# Patient Record
Sex: Female | Born: 1940 | Race: White | Hispanic: No | Smoking: Former smoker
Health system: Southern US, Community
[De-identification: ages and names within clinical notes are randomized; demographics above are authoritative.]

## PROBLEM LIST (undated history)

## (undated) ENCOUNTER — Emergency Department (HOSPITAL_COMMUNITY): Admission: EM | Payer: Medicare Other | Source: Home / Self Care

## (undated) DIAGNOSIS — F419 Anxiety disorder, unspecified: Secondary | ICD-10-CM

## (undated) DIAGNOSIS — J4489 Other specified chronic obstructive pulmonary disease: Secondary | ICD-10-CM

## (undated) DIAGNOSIS — E785 Hyperlipidemia, unspecified: Secondary | ICD-10-CM

## (undated) DIAGNOSIS — N390 Urinary tract infection, site not specified: Secondary | ICD-10-CM

## (undated) DIAGNOSIS — I00 Rheumatic fever without heart involvement: Secondary | ICD-10-CM

## (undated) DIAGNOSIS — I350 Nonrheumatic aortic (valve) stenosis: Secondary | ICD-10-CM

## (undated) DIAGNOSIS — F0390 Unspecified dementia without behavioral disturbance: Secondary | ICD-10-CM

## (undated) DIAGNOSIS — Z8639 Personal history of other endocrine, nutritional and metabolic disease: Secondary | ICD-10-CM

## (undated) DIAGNOSIS — A419 Sepsis, unspecified organism: Secondary | ICD-10-CM

## (undated) DIAGNOSIS — I10 Essential (primary) hypertension: Secondary | ICD-10-CM

## (undated) DIAGNOSIS — R569 Unspecified convulsions: Secondary | ICD-10-CM

## (undated) DIAGNOSIS — F32A Depression, unspecified: Secondary | ICD-10-CM

## (undated) DIAGNOSIS — L723 Sebaceous cyst: Secondary | ICD-10-CM

## (undated) DIAGNOSIS — F329 Major depressive disorder, single episode, unspecified: Secondary | ICD-10-CM

## (undated) DIAGNOSIS — J449 Chronic obstructive pulmonary disease, unspecified: Secondary | ICD-10-CM

## (undated) DIAGNOSIS — N289 Disorder of kidney and ureter, unspecified: Secondary | ICD-10-CM

## (undated) DIAGNOSIS — M1990 Unspecified osteoarthritis, unspecified site: Secondary | ICD-10-CM

## (undated) HISTORY — PX: CARDIAC VALVE REPLACEMENT: SHX585

## (undated) HISTORY — PX: BREAST LUMPECTOMY: SHX2

## (undated) HISTORY — PX: AORTIC VALVE REPLACEMENT: SHX41

## (undated) HISTORY — DX: Urinary tract infection, site not specified: N39.0

## (undated) HISTORY — DX: Chronic obstructive pulmonary disease, unspecified: J44.9

## (undated) HISTORY — PX: TONSILLECTOMY: SUR1361

## (undated) HISTORY — PX: APPENDECTOMY: SHX54

## (undated) HISTORY — DX: Sebaceous cyst: L72.3

## (undated) HISTORY — PX: CHOLECYSTECTOMY: SHX55

## (undated) HISTORY — DX: Anxiety disorder, unspecified: F41.9

## (undated) HISTORY — DX: Rheumatic fever without heart involvement: I00

## (undated) HISTORY — DX: Nonrheumatic aortic (valve) stenosis: I35.0

## (undated) HISTORY — DX: Disorder of kidney and ureter, unspecified: N28.9

## (undated) HISTORY — DX: Essential (primary) hypertension: I10

## (undated) HISTORY — DX: Hyperlipidemia, unspecified: E78.5

## (undated) HISTORY — DX: Sepsis, unspecified organism: A41.9

## (undated) HISTORY — DX: Other specified chronic obstructive pulmonary disease: J44.89

## (undated) HISTORY — DX: Personal history of other endocrine, nutritional and metabolic disease: Z86.39

## (undated) HISTORY — PX: LUMBAR LAMINECTOMY: SHX95

---

## 2002-04-29 ENCOUNTER — Encounter: Payer: Self-pay | Admitting: Internal Medicine

## 2002-04-29 ENCOUNTER — Ambulatory Visit (HOSPITAL_COMMUNITY): Admission: RE | Admit: 2002-04-29 | Discharge: 2002-04-29 | Payer: Self-pay | Admitting: Internal Medicine

## 2002-08-06 ENCOUNTER — Encounter: Payer: Self-pay | Admitting: General Surgery

## 2002-08-06 ENCOUNTER — Ambulatory Visit (HOSPITAL_COMMUNITY): Admission: RE | Admit: 2002-08-06 | Discharge: 2002-08-06 | Payer: Self-pay | Admitting: General Surgery

## 2002-10-22 ENCOUNTER — Ambulatory Visit (HOSPITAL_COMMUNITY): Admission: RE | Admit: 2002-10-22 | Discharge: 2002-10-22 | Payer: Self-pay | Admitting: General Surgery

## 2002-10-23 ENCOUNTER — Encounter (HOSPITAL_COMMUNITY): Admission: RE | Admit: 2002-10-23 | Discharge: 2002-11-22 | Payer: Self-pay | Admitting: General Surgery

## 2002-10-24 ENCOUNTER — Encounter: Payer: Self-pay | Admitting: General Surgery

## 2002-10-29 ENCOUNTER — Encounter: Payer: Self-pay | Admitting: General Surgery

## 2002-10-31 ENCOUNTER — Ambulatory Visit (HOSPITAL_COMMUNITY): Admission: RE | Admit: 2002-10-31 | Discharge: 2002-10-31 | Payer: Self-pay | Admitting: General Surgery

## 2002-11-07 ENCOUNTER — Ambulatory Visit (HOSPITAL_COMMUNITY): Admission: RE | Admit: 2002-11-07 | Discharge: 2002-11-07 | Payer: Self-pay | Admitting: *Deleted

## 2002-11-12 ENCOUNTER — Ambulatory Visit (HOSPITAL_COMMUNITY): Admission: RE | Admit: 2002-11-12 | Discharge: 2002-11-12 | Payer: Self-pay | Admitting: *Deleted

## 2002-11-28 ENCOUNTER — Encounter: Payer: Self-pay | Admitting: Cardiothoracic Surgery

## 2002-12-03 ENCOUNTER — Encounter: Payer: Self-pay | Admitting: Cardiothoracic Surgery

## 2002-12-03 ENCOUNTER — Inpatient Hospital Stay (HOSPITAL_COMMUNITY): Admission: RE | Admit: 2002-12-03 | Discharge: 2002-12-10 | Payer: Self-pay | Admitting: Cardiothoracic Surgery

## 2002-12-03 ENCOUNTER — Encounter (INDEPENDENT_AMBULATORY_CARE_PROVIDER_SITE_OTHER): Payer: Self-pay | Admitting: Specialist

## 2002-12-04 ENCOUNTER — Encounter: Payer: Self-pay | Admitting: Cardiothoracic Surgery

## 2002-12-05 ENCOUNTER — Encounter: Payer: Self-pay | Admitting: Cardiothoracic Surgery

## 2002-12-06 ENCOUNTER — Encounter: Payer: Self-pay | Admitting: Cardiothoracic Surgery

## 2002-12-23 ENCOUNTER — Ambulatory Visit (HOSPITAL_COMMUNITY): Admission: RE | Admit: 2002-12-23 | Discharge: 2002-12-23 | Payer: Self-pay | Admitting: *Deleted

## 2003-02-21 HISTORY — PX: COLON SURGERY: SHX602

## 2003-04-29 ENCOUNTER — Inpatient Hospital Stay (HOSPITAL_COMMUNITY): Admission: RE | Admit: 2003-04-29 | Discharge: 2003-05-04 | Payer: Self-pay | Admitting: General Surgery

## 2003-04-29 ENCOUNTER — Encounter (INDEPENDENT_AMBULATORY_CARE_PROVIDER_SITE_OTHER): Payer: Self-pay | Admitting: Specialist

## 2003-05-27 ENCOUNTER — Ambulatory Visit (HOSPITAL_COMMUNITY): Admission: RE | Admit: 2003-05-27 | Discharge: 2003-05-27 | Payer: Self-pay | Admitting: General Surgery

## 2003-11-09 ENCOUNTER — Ambulatory Visit (HOSPITAL_COMMUNITY): Admission: RE | Admit: 2003-11-09 | Discharge: 2003-11-09 | Payer: Self-pay | Admitting: Ophthalmology

## 2004-03-03 ENCOUNTER — Ambulatory Visit: Payer: Self-pay | Admitting: Family Medicine

## 2004-03-24 ENCOUNTER — Ambulatory Visit: Payer: Self-pay | Admitting: Family Medicine

## 2004-03-24 ENCOUNTER — Ambulatory Visit: Payer: Self-pay | Admitting: Internal Medicine

## 2004-04-11 ENCOUNTER — Ambulatory Visit: Payer: Self-pay | Admitting: *Deleted

## 2004-04-14 ENCOUNTER — Ambulatory Visit: Payer: Self-pay | Admitting: Family Medicine

## 2004-06-02 ENCOUNTER — Ambulatory Visit: Payer: Self-pay | Admitting: Family Medicine

## 2004-06-07 ENCOUNTER — Ambulatory Visit: Payer: Self-pay | Admitting: *Deleted

## 2004-08-16 ENCOUNTER — Ambulatory Visit: Payer: Self-pay | Admitting: Family Medicine

## 2004-10-19 ENCOUNTER — Ambulatory Visit: Payer: Self-pay | Admitting: Family Medicine

## 2004-10-25 ENCOUNTER — Ambulatory Visit (HOSPITAL_COMMUNITY): Admission: RE | Admit: 2004-10-25 | Discharge: 2004-10-25 | Payer: Self-pay | Admitting: Family Medicine

## 2004-11-10 ENCOUNTER — Ambulatory Visit (HOSPITAL_COMMUNITY): Admission: RE | Admit: 2004-11-10 | Discharge: 2004-11-10 | Payer: Self-pay | Admitting: Nephrology

## 2005-03-15 ENCOUNTER — Ambulatory Visit: Payer: Self-pay | Admitting: Family Medicine

## 2005-03-20 ENCOUNTER — Ambulatory Visit (HOSPITAL_COMMUNITY): Admission: RE | Admit: 2005-03-20 | Discharge: 2005-03-20 | Payer: Self-pay | Admitting: Family Medicine

## 2005-04-12 ENCOUNTER — Ambulatory Visit: Payer: Self-pay | Admitting: Family Medicine

## 2005-05-11 LAB — HM COLONOSCOPY: HM Colonoscopy: ABNORMAL

## 2005-06-15 ENCOUNTER — Ambulatory Visit: Payer: Self-pay | Admitting: Family Medicine

## 2005-08-30 ENCOUNTER — Ambulatory Visit: Payer: Self-pay | Admitting: Family Medicine

## 2005-11-16 ENCOUNTER — Ambulatory Visit: Payer: Self-pay | Admitting: Family Medicine

## 2005-11-17 ENCOUNTER — Ambulatory Visit: Payer: Self-pay | Admitting: Internal Medicine

## 2005-11-17 ENCOUNTER — Ambulatory Visit (HOSPITAL_COMMUNITY): Admission: RE | Admit: 2005-11-17 | Discharge: 2005-11-17 | Payer: Self-pay | Admitting: Family Medicine

## 2005-11-20 ENCOUNTER — Ambulatory Visit: Payer: Self-pay | Admitting: Cardiology

## 2005-11-20 ENCOUNTER — Ambulatory Visit (HOSPITAL_COMMUNITY): Admission: RE | Admit: 2005-11-20 | Discharge: 2005-11-20 | Payer: Self-pay | Admitting: Internal Medicine

## 2005-11-23 ENCOUNTER — Ambulatory Visit: Payer: Self-pay | Admitting: Family Medicine

## 2005-11-23 ENCOUNTER — Ambulatory Visit (HOSPITAL_COMMUNITY): Payer: Self-pay | Admitting: Psychology

## 2005-11-26 ENCOUNTER — Encounter: Admission: RE | Admit: 2005-11-26 | Discharge: 2005-11-26 | Payer: Self-pay | Admitting: Family Medicine

## 2005-12-05 ENCOUNTER — Ambulatory Visit (HOSPITAL_COMMUNITY): Payer: Self-pay | Admitting: Psychology

## 2005-12-19 ENCOUNTER — Ambulatory Visit (HOSPITAL_COMMUNITY): Payer: Self-pay | Admitting: Psychology

## 2005-12-21 ENCOUNTER — Ambulatory Visit: Payer: Self-pay | Admitting: Family Medicine

## 2005-12-29 ENCOUNTER — Ambulatory Visit: Payer: Self-pay | Admitting: Internal Medicine

## 2006-01-15 ENCOUNTER — Ambulatory Visit (HOSPITAL_COMMUNITY): Payer: Self-pay | Admitting: Psychology

## 2006-02-22 ENCOUNTER — Ambulatory Visit: Payer: Self-pay | Admitting: Family Medicine

## 2006-02-22 ENCOUNTER — Ambulatory Visit (HOSPITAL_COMMUNITY): Admission: RE | Admit: 2006-02-22 | Discharge: 2006-02-22 | Payer: Self-pay | Admitting: Family Medicine

## 2006-03-15 ENCOUNTER — Ambulatory Visit: Payer: Self-pay | Admitting: Family Medicine

## 2006-03-15 ENCOUNTER — Other Ambulatory Visit: Admission: RE | Admit: 2006-03-15 | Discharge: 2006-03-15 | Payer: Self-pay | Admitting: Family Medicine

## 2006-03-15 ENCOUNTER — Encounter: Payer: Self-pay | Admitting: Family Medicine

## 2006-03-15 LAB — CONVERTED CEMR LAB: Pap Smear: NORMAL

## 2006-03-22 ENCOUNTER — Ambulatory Visit (HOSPITAL_COMMUNITY): Admission: RE | Admit: 2006-03-22 | Discharge: 2006-03-22 | Payer: Self-pay | Admitting: Family Medicine

## 2006-07-11 ENCOUNTER — Encounter: Payer: Self-pay | Admitting: Family Medicine

## 2006-07-11 LAB — CONVERTED CEMR LAB: VLDL: 17 mg/dL (ref 0–40)

## 2006-07-12 ENCOUNTER — Ambulatory Visit: Payer: Self-pay | Admitting: Family Medicine

## 2006-09-12 ENCOUNTER — Ambulatory Visit: Payer: Self-pay | Admitting: Family Medicine

## 2006-09-19 ENCOUNTER — Ambulatory Visit (HOSPITAL_COMMUNITY): Admission: RE | Admit: 2006-09-19 | Discharge: 2006-09-19 | Payer: Self-pay | Admitting: Family Medicine

## 2006-11-09 ENCOUNTER — Encounter: Payer: Self-pay | Admitting: Family Medicine

## 2006-11-09 LAB — CONVERTED CEMR LAB
AST: 15 units/L (ref 0–37)
Albumin: 4.4 g/dL (ref 3.5–5.2)
BUN: 13 mg/dL (ref 6–23)
Calcium: 10.1 mg/dL (ref 8.4–10.5)
Chloride: 100 meq/L (ref 96–112)
Potassium: 4.2 meq/L (ref 3.5–5.3)
Sodium: 137 meq/L (ref 135–145)

## 2006-11-12 ENCOUNTER — Ambulatory Visit: Payer: Self-pay | Admitting: Family Medicine

## 2006-11-20 ENCOUNTER — Ambulatory Visit: Payer: Self-pay | Admitting: Family Medicine

## 2007-03-15 ENCOUNTER — Encounter: Payer: Self-pay | Admitting: Family Medicine

## 2007-03-15 DIAGNOSIS — F17200 Nicotine dependence, unspecified, uncomplicated: Secondary | ICD-10-CM | POA: Insufficient documentation

## 2007-03-15 DIAGNOSIS — I10 Essential (primary) hypertension: Secondary | ICD-10-CM

## 2007-03-15 DIAGNOSIS — J449 Chronic obstructive pulmonary disease, unspecified: Secondary | ICD-10-CM | POA: Insufficient documentation

## 2007-03-15 DIAGNOSIS — I5032 Chronic diastolic (congestive) heart failure: Secondary | ICD-10-CM

## 2007-03-15 DIAGNOSIS — E785 Hyperlipidemia, unspecified: Secondary | ICD-10-CM

## 2007-05-07 ENCOUNTER — Ambulatory Visit (HOSPITAL_COMMUNITY): Admission: RE | Admit: 2007-05-07 | Discharge: 2007-05-07 | Payer: Self-pay | Admitting: Family Medicine

## 2007-05-07 ENCOUNTER — Ambulatory Visit: Payer: Self-pay | Admitting: Family Medicine

## 2007-05-09 ENCOUNTER — Ambulatory Visit (HOSPITAL_COMMUNITY): Admission: RE | Admit: 2007-05-09 | Discharge: 2007-05-09 | Payer: Self-pay | Admitting: Family Medicine

## 2007-06-04 ENCOUNTER — Ambulatory Visit: Payer: Self-pay | Admitting: Family Medicine

## 2007-06-04 LAB — CONVERTED CEMR LAB
ALT: 19 units/L (ref 0–35)
BUN: 14 mg/dL (ref 6–23)
Basophils Relative: 1 % (ref 0–1)
CO2: 26 meq/L (ref 19–32)
Chloride: 103 meq/L (ref 96–112)
Eosinophils Absolute: 0.2 10*3/uL (ref 0.0–0.7)
Glucose, Bld: 89 mg/dL (ref 70–99)
HCT: 40.4 % (ref 36.0–46.0)
HDL: 67 mg/dL (ref >39)
Indirect Bilirubin: 0.2 mg/dL (ref 0.0–0.9)
LDL Cholesterol: 183 mg/dL — ABNORMAL HIGH (ref 0–99)
Lymphocytes Relative: 24 % (ref 12–46)
Lymphs Abs: 2.4 10*3/uL (ref 0.7–4.0)
MCHC: 32.4 g/dL (ref 30.0–36.0)
Monocytes Absolute: 0.7 10*3/uL (ref 0.1–1.0)
Monocytes Relative: 6 % (ref 3–12)
Neutrophils Relative %: 68 % (ref 43–77)
Potassium: 4.5 meq/L (ref 3.5–5.3)
Sodium: 143 meq/L (ref 135–145)
Total Protein: 7.3 g/dL (ref 6.0–8.3)

## 2007-06-06 ENCOUNTER — Encounter: Payer: Self-pay | Admitting: Family Medicine

## 2007-06-06 LAB — CONVERTED CEMR LAB: T3, Free: 2.9 pg/mL (ref 2.3–4.2)

## 2007-06-11 ENCOUNTER — Encounter: Payer: Self-pay | Admitting: Family Medicine

## 2007-06-12 ENCOUNTER — Ambulatory Visit (HOSPITAL_COMMUNITY): Admission: RE | Admit: 2007-06-12 | Discharge: 2007-06-12 | Payer: Self-pay | Admitting: Family Medicine

## 2007-06-20 ENCOUNTER — Ambulatory Visit: Payer: Self-pay | Admitting: Endocrinology

## 2007-06-20 DIAGNOSIS — E079 Disorder of thyroid, unspecified: Secondary | ICD-10-CM | POA: Insufficient documentation

## 2007-06-27 ENCOUNTER — Ambulatory Visit: Payer: Self-pay | Admitting: Family Medicine

## 2007-07-04 ENCOUNTER — Ambulatory Visit (HOSPITAL_COMMUNITY): Admission: RE | Admit: 2007-07-04 | Discharge: 2007-07-04 | Payer: Self-pay | Admitting: Family Medicine

## 2007-07-08 ENCOUNTER — Ambulatory Visit: Payer: Self-pay | Admitting: Internal Medicine

## 2007-07-19 ENCOUNTER — Ambulatory Visit: Payer: Self-pay | Admitting: Emergency Medicine

## 2007-07-23 ENCOUNTER — Telehealth (INDEPENDENT_AMBULATORY_CARE_PROVIDER_SITE_OTHER): Payer: Self-pay | Admitting: *Deleted

## 2007-08-06 ENCOUNTER — Encounter (HOSPITAL_COMMUNITY): Admission: RE | Admit: 2007-08-06 | Discharge: 2007-09-05 | Payer: Self-pay | Admitting: Endocrinology

## 2007-08-07 ENCOUNTER — Telehealth: Payer: Self-pay | Admitting: Emergency Medicine

## 2007-08-08 ENCOUNTER — Ambulatory Visit: Payer: Self-pay | Admitting: Family Medicine

## 2007-08-08 ENCOUNTER — Encounter: Payer: Self-pay | Admitting: Family Medicine

## 2007-08-08 DIAGNOSIS — L723 Sebaceous cyst: Secondary | ICD-10-CM

## 2007-08-15 ENCOUNTER — Ambulatory Visit: Payer: Self-pay | Admitting: Emergency Medicine

## 2007-08-29 ENCOUNTER — Ambulatory Visit: Payer: Self-pay | Admitting: Internal Medicine

## 2007-09-02 ENCOUNTER — Ambulatory Visit (HOSPITAL_COMMUNITY): Admission: RE | Admit: 2007-09-02 | Discharge: 2007-09-02 | Payer: Self-pay | Admitting: Internal Medicine

## 2007-09-02 ENCOUNTER — Ambulatory Visit: Payer: Self-pay | Admitting: Cardiology

## 2007-09-02 ENCOUNTER — Encounter: Payer: Self-pay | Admitting: Internal Medicine

## 2007-09-03 ENCOUNTER — Telehealth (INDEPENDENT_AMBULATORY_CARE_PROVIDER_SITE_OTHER): Payer: Self-pay | Admitting: *Deleted

## 2007-09-13 ENCOUNTER — Ambulatory Visit (HOSPITAL_COMMUNITY): Admission: RE | Admit: 2007-09-13 | Discharge: 2007-09-13 | Payer: Self-pay | Admitting: General Surgery

## 2007-09-13 ENCOUNTER — Encounter (INDEPENDENT_AMBULATORY_CARE_PROVIDER_SITE_OTHER): Payer: Self-pay | Admitting: General Surgery

## 2007-09-24 ENCOUNTER — Encounter: Payer: Self-pay | Admitting: Family Medicine

## 2007-09-27 ENCOUNTER — Ambulatory Visit: Payer: Self-pay | Admitting: Family Medicine

## 2007-09-27 ENCOUNTER — Encounter: Payer: Self-pay | Admitting: Family Medicine

## 2007-09-27 ENCOUNTER — Ambulatory Visit (HOSPITAL_COMMUNITY): Admission: RE | Admit: 2007-09-27 | Discharge: 2007-09-27 | Payer: Self-pay | Admitting: Family Medicine

## 2007-10-07 ENCOUNTER — Encounter: Payer: Self-pay | Admitting: Family Medicine

## 2007-10-07 LAB — CONVERTED CEMR LAB
Albumin: 4.8 g/dL (ref 3.5–5.2)
Alkaline Phosphatase: 52 units/L (ref 39–117)
BUN: 19 mg/dL (ref 6–23)
Calcium: 10.3 mg/dL (ref 8.4–10.5)
Chloride: 106 meq/L (ref 96–112)
Cholesterol: 223 mg/dL — ABNORMAL HIGH (ref 0–200)
Creatinine, Ser: 1.22 mg/dL — ABNORMAL HIGH (ref 0.40–1.20)
Glucose, Bld: 102 mg/dL — ABNORMAL HIGH (ref 70–99)
Indirect Bilirubin: 0.3 mg/dL (ref 0.0–0.9)
Potassium: 4.6 meq/L (ref 3.5–5.3)
Triglycerides: 71 mg/dL (ref <150)
VLDL: 14 mg/dL (ref 0–40)

## 2007-10-09 ENCOUNTER — Ambulatory Visit: Payer: Self-pay | Admitting: Family Medicine

## 2007-10-24 ENCOUNTER — Telehealth (INDEPENDENT_AMBULATORY_CARE_PROVIDER_SITE_OTHER): Payer: Self-pay | Admitting: *Deleted

## 2007-12-03 ENCOUNTER — Telehealth (INDEPENDENT_AMBULATORY_CARE_PROVIDER_SITE_OTHER): Payer: Self-pay | Admitting: *Deleted

## 2007-12-03 ENCOUNTER — Encounter: Payer: Self-pay | Admitting: Family Medicine

## 2008-06-09 ENCOUNTER — Ambulatory Visit: Payer: Self-pay | Admitting: Family Medicine

## 2008-06-09 DIAGNOSIS — M818 Other osteoporosis without current pathological fracture: Secondary | ICD-10-CM

## 2008-06-09 DIAGNOSIS — R5381 Other malaise: Secondary | ICD-10-CM

## 2008-06-09 DIAGNOSIS — R5383 Other fatigue: Secondary | ICD-10-CM

## 2008-06-09 DIAGNOSIS — J42 Unspecified chronic bronchitis: Secondary | ICD-10-CM | POA: Insufficient documentation

## 2008-06-10 ENCOUNTER — Ambulatory Visit (HOSPITAL_COMMUNITY): Admission: RE | Admit: 2008-06-10 | Discharge: 2008-06-10 | Payer: Self-pay | Admitting: Family Medicine

## 2008-06-10 ENCOUNTER — Encounter: Payer: Self-pay | Admitting: Family Medicine

## 2008-06-11 ENCOUNTER — Telehealth: Payer: Self-pay | Admitting: Family Medicine

## 2008-06-11 LAB — CONVERTED CEMR LAB
ALT: 12 units/L (ref 0–35)
BUN: 16 mg/dL (ref 6–23)
Bilirubin, Direct: 0.1 mg/dL (ref 0.0–0.3)
CO2: 26 meq/L (ref 19–32)
Cholesterol: 174 mg/dL (ref 0–200)
Glucose, Bld: 87 mg/dL (ref 70–99)
Hemoglobin: 12.5 g/dL (ref 12.0–15.0)
Indirect Bilirubin: 0.3 mg/dL (ref 0.0–0.9)
LDL Cholesterol: 88 mg/dL (ref 0–99)
MCHC: 32.2 g/dL (ref 30.0–36.0)
Potassium: 5.5 meq/L — ABNORMAL HIGH (ref 3.5–5.3)
RBC: 4.11 M/uL (ref 3.87–5.11)
TSH: 0.997 microintl units/mL (ref 0.350–4.500)
Total Bilirubin: 0.4 mg/dL (ref 0.3–1.2)
Total CHOL/HDL Ratio: 2.4
VLDL: 12 mg/dL (ref 0–40)
WBC: 7.8 10*3/uL (ref 4.0–10.5)

## 2008-06-14 ENCOUNTER — Emergency Department (HOSPITAL_COMMUNITY): Admission: EM | Admit: 2008-06-14 | Discharge: 2008-06-14 | Payer: Self-pay | Admitting: Emergency Medicine

## 2008-06-15 ENCOUNTER — Telehealth: Payer: Self-pay | Admitting: Family Medicine

## 2008-06-16 ENCOUNTER — Ambulatory Visit (HOSPITAL_COMMUNITY): Admission: RE | Admit: 2008-06-16 | Discharge: 2008-06-16 | Payer: Self-pay | Admitting: Family Medicine

## 2008-06-22 ENCOUNTER — Ambulatory Visit: Payer: Self-pay | Admitting: Emergency Medicine

## 2008-06-30 ENCOUNTER — Encounter: Payer: Self-pay | Admitting: Internal Medicine

## 2008-07-08 ENCOUNTER — Telehealth (INDEPENDENT_AMBULATORY_CARE_PROVIDER_SITE_OTHER): Payer: Self-pay | Admitting: *Deleted

## 2008-07-15 ENCOUNTER — Telehealth (INDEPENDENT_AMBULATORY_CARE_PROVIDER_SITE_OTHER): Payer: Self-pay | Admitting: *Deleted

## 2008-07-15 ENCOUNTER — Ambulatory Visit: Payer: Self-pay | Admitting: Family Medicine

## 2008-07-16 ENCOUNTER — Telehealth (INDEPENDENT_AMBULATORY_CARE_PROVIDER_SITE_OTHER): Payer: Self-pay | Admitting: *Deleted

## 2008-07-17 ENCOUNTER — Encounter: Payer: Self-pay | Admitting: Internal Medicine

## 2008-07-21 ENCOUNTER — Encounter: Payer: Self-pay | Admitting: Family Medicine

## 2008-07-23 ENCOUNTER — Ambulatory Visit: Payer: Self-pay | Admitting: Internal Medicine

## 2008-07-23 ENCOUNTER — Ambulatory Visit (HOSPITAL_COMMUNITY): Admission: RE | Admit: 2008-07-23 | Discharge: 2008-07-23 | Payer: Self-pay | Admitting: Internal Medicine

## 2008-07-23 ENCOUNTER — Encounter: Payer: Self-pay | Admitting: Internal Medicine

## 2008-07-23 HISTORY — PX: COLONOSCOPY: SHX174

## 2008-07-24 ENCOUNTER — Encounter: Payer: Self-pay | Admitting: Internal Medicine

## 2008-07-27 DIAGNOSIS — F418 Other specified anxiety disorders: Secondary | ICD-10-CM | POA: Insufficient documentation

## 2008-07-27 DIAGNOSIS — F5105 Insomnia due to other mental disorder: Secondary | ICD-10-CM

## 2008-07-30 ENCOUNTER — Encounter: Payer: Self-pay | Admitting: Family Medicine

## 2008-08-10 DIAGNOSIS — I359 Nonrheumatic aortic valve disorder, unspecified: Secondary | ICD-10-CM | POA: Insufficient documentation

## 2008-08-19 ENCOUNTER — Encounter: Payer: Self-pay | Admitting: Family Medicine

## 2008-08-28 ENCOUNTER — Ambulatory Visit: Payer: Self-pay | Admitting: Emergency Medicine

## 2008-09-09 ENCOUNTER — Ambulatory Visit: Payer: Self-pay | Admitting: Family Medicine

## 2008-09-09 DIAGNOSIS — F418 Other specified anxiety disorders: Secondary | ICD-10-CM | POA: Insufficient documentation

## 2008-09-24 ENCOUNTER — Encounter: Payer: Self-pay | Admitting: Family Medicine

## 2008-10-21 ENCOUNTER — Encounter: Payer: Self-pay | Admitting: Family Medicine

## 2008-12-01 ENCOUNTER — Encounter (INDEPENDENT_AMBULATORY_CARE_PROVIDER_SITE_OTHER): Payer: Self-pay | Admitting: *Deleted

## 2008-12-01 LAB — CONVERTED CEMR LAB
ALT: 10 units/L
Alkaline Phosphatase: 61 units/L
Bilirubin, Direct: 0.1 mg/dL
CO2: 26 meq/L
Cholesterol: 241 mg/dL
Creatinine, Ser: 1.22 mg/dL
LDL Cholesterol: 151 mg/dL
Sodium: 142 meq/L
Total Protein: 6.8 g/dL
Triglycerides: 65 mg/dL

## 2008-12-02 LAB — CONVERTED CEMR LAB
AST: 15 units/L (ref 0–37)
Albumin: 4.5 g/dL (ref 3.5–5.2)
Bilirubin, Direct: 0.1 mg/dL (ref 0.0–0.3)
CO2: 26 meq/L (ref 19–32)
Chloride: 104 meq/L (ref 96–112)
Glucose, Bld: 94 mg/dL (ref 70–99)
HDL: 77 mg/dL (ref >39)
LDL Cholesterol: 151 mg/dL — ABNORMAL HIGH (ref 0–99)
Potassium: 4.8 meq/L (ref 3.5–5.3)
Sodium: 142 meq/L (ref 135–145)
Total Bilirubin: 0.4 mg/dL (ref 0.3–1.2)
Total CHOL/HDL Ratio: 3.1
VLDL: 13 mg/dL (ref 0–40)

## 2008-12-03 ENCOUNTER — Ambulatory Visit: Payer: Self-pay | Admitting: Family Medicine

## 2008-12-08 ENCOUNTER — Encounter: Payer: Self-pay | Admitting: Family Medicine

## 2008-12-30 ENCOUNTER — Ambulatory Visit: Payer: Self-pay | Admitting: Emergency Medicine

## 2009-01-01 ENCOUNTER — Telehealth: Payer: Self-pay | Admitting: Family Medicine

## 2009-01-04 ENCOUNTER — Telehealth: Payer: Self-pay | Admitting: Family Medicine

## 2009-02-13 ENCOUNTER — Emergency Department (HOSPITAL_COMMUNITY): Admission: EM | Admit: 2009-02-13 | Discharge: 2009-02-13 | Payer: Self-pay | Admitting: Emergency Medicine

## 2009-02-15 ENCOUNTER — Telehealth: Payer: Self-pay | Admitting: Internal Medicine

## 2009-02-16 ENCOUNTER — Encounter: Payer: Self-pay | Admitting: Internal Medicine

## 2009-02-22 ENCOUNTER — Ambulatory Visit: Payer: Self-pay | Admitting: Family Medicine

## 2009-02-22 ENCOUNTER — Telehealth: Payer: Self-pay | Admitting: Family Medicine

## 2009-02-22 DIAGNOSIS — R062 Wheezing: Secondary | ICD-10-CM

## 2009-02-26 ENCOUNTER — Telehealth: Payer: Self-pay | Admitting: Family Medicine

## 2009-04-05 ENCOUNTER — Ambulatory Visit: Payer: Self-pay | Admitting: Family Medicine

## 2009-04-06 ENCOUNTER — Encounter: Payer: Self-pay | Admitting: Family Medicine

## 2009-04-09 ENCOUNTER — Encounter: Payer: Self-pay | Admitting: Family Medicine

## 2009-04-26 ENCOUNTER — Encounter (INDEPENDENT_AMBULATORY_CARE_PROVIDER_SITE_OTHER): Payer: Self-pay | Admitting: *Deleted

## 2009-04-29 ENCOUNTER — Ambulatory Visit: Payer: Self-pay | Admitting: Cardiology

## 2009-05-04 ENCOUNTER — Ambulatory Visit: Payer: Self-pay | Admitting: Cardiology

## 2009-05-04 ENCOUNTER — Encounter: Payer: Self-pay | Admitting: Cardiology

## 2009-05-04 ENCOUNTER — Ambulatory Visit (HOSPITAL_COMMUNITY): Admission: RE | Admit: 2009-05-04 | Discharge: 2009-05-04 | Payer: Self-pay | Admitting: Cardiology

## 2009-05-05 ENCOUNTER — Encounter: Payer: Self-pay | Admitting: Family Medicine

## 2009-05-10 ENCOUNTER — Encounter: Payer: Self-pay | Admitting: Cardiology

## 2009-05-14 ENCOUNTER — Telehealth: Payer: Self-pay | Admitting: Family Medicine

## 2009-05-14 ENCOUNTER — Encounter: Payer: Self-pay | Admitting: Family Medicine

## 2009-05-18 ENCOUNTER — Encounter: Payer: Self-pay | Admitting: Family Medicine

## 2009-05-21 ENCOUNTER — Encounter: Payer: Self-pay | Admitting: Family Medicine

## 2009-07-06 LAB — CONVERTED CEMR LAB
BUN: 13 mg/dL (ref 6–23)
Bilirubin, Direct: 0.1 mg/dL (ref 0.0–0.3)
CO2: 29 meq/L (ref 19–32)
Glucose, Bld: 104 mg/dL — ABNORMAL HIGH (ref 70–99)
Indirect Bilirubin: 0.3 mg/dL (ref 0.0–0.9)
Potassium: 4.4 meq/L (ref 3.5–5.3)
Sodium: 142 meq/L (ref 135–145)
Total Bilirubin: 0.4 mg/dL (ref 0.3–1.2)
Total CHOL/HDL Ratio: 2.4
VLDL: 11 mg/dL (ref 0–40)

## 2009-07-12 ENCOUNTER — Ambulatory Visit: Payer: Self-pay | Admitting: Family Medicine

## 2009-07-12 DIAGNOSIS — R7301 Impaired fasting glucose: Secondary | ICD-10-CM | POA: Insufficient documentation

## 2009-07-12 LAB — CONVERTED CEMR LAB
Eosinophils Absolute: 0.1 10*3/uL (ref 0.0–0.7)
Eosinophils Relative: 1 % (ref 0–5)
HCT: 39.6 % (ref 36.0–46.0)
Hemoglobin: 12.7 g/dL (ref 12.0–15.0)
Lymphs Abs: 1.5 10*3/uL (ref 0.7–4.0)
MCHC: 32.1 g/dL (ref 30.0–36.0)
MCV: 91.9 fL (ref 78.0–100.0)
Monocytes Absolute: 0.4 10*3/uL (ref 0.1–1.0)
Monocytes Relative: 5 % (ref 3–12)
RBC: 4.31 M/uL (ref 3.87–5.11)
Vit D, 25-Hydroxy: 41 ng/mL (ref 30–89)
WBC: 7.7 10*3/uL (ref 4.0–10.5)

## 2009-07-15 ENCOUNTER — Ambulatory Visit (HOSPITAL_COMMUNITY): Admission: RE | Admit: 2009-07-15 | Discharge: 2009-07-15 | Payer: Self-pay | Admitting: Family Medicine

## 2009-07-21 ENCOUNTER — Encounter: Payer: Self-pay | Admitting: Family Medicine

## 2009-07-28 ENCOUNTER — Ambulatory Visit: Payer: Self-pay | Admitting: Emergency Medicine

## 2009-09-13 ENCOUNTER — Ambulatory Visit: Payer: Self-pay | Admitting: Family Medicine

## 2009-09-13 ENCOUNTER — Ambulatory Visit (HOSPITAL_COMMUNITY): Admission: RE | Admit: 2009-09-13 | Discharge: 2009-09-13 | Payer: Self-pay | Admitting: Family Medicine

## 2009-09-13 ENCOUNTER — Telehealth: Payer: Self-pay | Admitting: Family Medicine

## 2009-09-13 DIAGNOSIS — R634 Abnormal weight loss: Secondary | ICD-10-CM

## 2009-09-17 ENCOUNTER — Ambulatory Visit (HOSPITAL_COMMUNITY): Admission: RE | Admit: 2009-09-17 | Discharge: 2009-09-17 | Payer: Self-pay | Admitting: Family Medicine

## 2009-09-18 ENCOUNTER — Encounter: Payer: Self-pay | Admitting: Family Medicine

## 2009-09-20 LAB — CONVERTED CEMR LAB: Creatinine, Ser: 0.98 mg/dL (ref 0.40–1.20)

## 2009-09-21 ENCOUNTER — Telehealth: Payer: Self-pay | Admitting: Family Medicine

## 2009-09-29 ENCOUNTER — Ambulatory Visit: Payer: Self-pay | Admitting: Gastroenterology

## 2009-09-29 ENCOUNTER — Encounter: Payer: Self-pay | Admitting: Internal Medicine

## 2009-09-29 DIAGNOSIS — R6881 Early satiety: Secondary | ICD-10-CM

## 2009-09-29 DIAGNOSIS — K5900 Constipation, unspecified: Secondary | ICD-10-CM | POA: Insufficient documentation

## 2009-10-12 ENCOUNTER — Ambulatory Visit: Payer: Self-pay | Admitting: Internal Medicine

## 2009-10-12 ENCOUNTER — Encounter (INDEPENDENT_AMBULATORY_CARE_PROVIDER_SITE_OTHER): Payer: Self-pay | Admitting: *Deleted

## 2009-10-12 ENCOUNTER — Telehealth (INDEPENDENT_AMBULATORY_CARE_PROVIDER_SITE_OTHER): Payer: Self-pay | Admitting: *Deleted

## 2009-10-12 ENCOUNTER — Ambulatory Visit (HOSPITAL_COMMUNITY): Admission: RE | Admit: 2009-10-12 | Discharge: 2009-10-12 | Payer: Self-pay | Admitting: Internal Medicine

## 2009-10-12 HISTORY — PX: ESOPHAGOGASTRODUODENOSCOPY: SHX1529

## 2009-10-15 ENCOUNTER — Encounter: Payer: Self-pay | Admitting: Internal Medicine

## 2009-10-19 ENCOUNTER — Encounter: Payer: Self-pay | Admitting: Internal Medicine

## 2009-10-20 ENCOUNTER — Ambulatory Visit (HOSPITAL_COMMUNITY): Admission: RE | Admit: 2009-10-20 | Discharge: 2009-10-20 | Payer: Self-pay | Admitting: Internal Medicine

## 2009-10-22 ENCOUNTER — Encounter: Payer: Self-pay | Admitting: Internal Medicine

## 2009-10-23 LAB — CONVERTED CEMR LAB
Albumin: 4.1 g/dL (ref 3.5–5.2)
Alkaline Phosphatase: 45 units/L (ref 39–117)
Chloride: 106 meq/L (ref 96–112)
Creatinine, Ser: 1.1 mg/dL (ref 0.40–1.20)
HDL: 81 mg/dL (ref >39)
LDL Cholesterol: 105 mg/dL — ABNORMAL HIGH (ref 0–99)
Potassium: 4 meq/L (ref 3.5–5.3)
Sodium: 142 meq/L (ref 135–145)
Total CHOL/HDL Ratio: 2.5
Total Protein: 6.3 g/dL (ref 6.0–8.3)
Triglycerides: 69 mg/dL (ref <150)

## 2009-10-26 ENCOUNTER — Encounter (HOSPITAL_COMMUNITY): Admission: RE | Admit: 2009-10-26 | Discharge: 2009-10-26 | Payer: Self-pay | Admitting: Internal Medicine

## 2009-10-28 ENCOUNTER — Telehealth: Payer: Self-pay | Admitting: Family Medicine

## 2009-11-01 ENCOUNTER — Ambulatory Visit: Payer: Self-pay | Admitting: Family Medicine

## 2009-11-01 ENCOUNTER — Encounter: Payer: Self-pay | Admitting: Internal Medicine

## 2009-11-01 DIAGNOSIS — M2740 Unspecified cyst of jaw: Secondary | ICD-10-CM

## 2009-11-25 ENCOUNTER — Telehealth: Payer: Self-pay | Admitting: Family Medicine

## 2009-12-03 ENCOUNTER — Telehealth: Payer: Self-pay | Admitting: Family Medicine

## 2009-12-07 ENCOUNTER — Telehealth: Payer: Self-pay | Admitting: Family Medicine

## 2009-12-13 ENCOUNTER — Ambulatory Visit: Payer: Self-pay | Admitting: Internal Medicine

## 2010-02-08 ENCOUNTER — Encounter: Payer: Self-pay | Admitting: Internal Medicine

## 2010-02-16 ENCOUNTER — Ambulatory Visit: Payer: Self-pay | Admitting: Emergency Medicine

## 2010-02-16 DIAGNOSIS — R911 Solitary pulmonary nodule: Secondary | ICD-10-CM

## 2010-02-18 ENCOUNTER — Encounter: Payer: Self-pay | Admitting: Family Medicine

## 2010-02-18 ENCOUNTER — Ambulatory Visit
Admission: RE | Admit: 2010-02-18 | Discharge: 2010-02-18 | Payer: Self-pay | Source: Home / Self Care | Attending: Family Medicine | Admitting: Family Medicine

## 2010-02-18 DIAGNOSIS — M7710 Lateral epicondylitis, unspecified elbow: Secondary | ICD-10-CM | POA: Insufficient documentation

## 2010-02-18 LAB — HM MAMMOGRAPHY

## 2010-02-18 LAB — HM COLONOSCOPY

## 2010-02-20 DIAGNOSIS — A419 Sepsis, unspecified organism: Secondary | ICD-10-CM

## 2010-02-20 DIAGNOSIS — Z8639 Personal history of other endocrine, nutritional and metabolic disease: Secondary | ICD-10-CM

## 2010-02-20 DIAGNOSIS — N390 Urinary tract infection, site not specified: Secondary | ICD-10-CM

## 2010-02-20 HISTORY — DX: Personal history of other endocrine, nutritional and metabolic disease: Z86.39

## 2010-02-20 HISTORY — DX: Sepsis, unspecified organism: A41.9

## 2010-02-20 HISTORY — DX: Sepsis, unspecified organism: N39.0

## 2010-03-02 ENCOUNTER — Encounter (INDEPENDENT_AMBULATORY_CARE_PROVIDER_SITE_OTHER): Payer: Self-pay | Admitting: *Deleted

## 2010-03-03 ENCOUNTER — Other Ambulatory Visit
Admission: RE | Admit: 2010-03-03 | Discharge: 2010-03-03 | Payer: Self-pay | Source: Home / Self Care | Admitting: Family Medicine

## 2010-03-03 ENCOUNTER — Ambulatory Visit
Admission: RE | Admit: 2010-03-03 | Discharge: 2010-03-03 | Payer: Self-pay | Source: Home / Self Care | Attending: Family Medicine | Admitting: Family Medicine

## 2010-03-07 ENCOUNTER — Encounter: Payer: Self-pay | Admitting: Family Medicine

## 2010-03-07 LAB — CONVERTED CEMR LAB
ALT: 11 units/L (ref 0–35)
AST: 15 units/L (ref 0–37)
Albumin: 4.3 g/dL (ref 3.5–5.2)
Alkaline Phosphatase: 48 units/L (ref 39–117)
CO2: 32 meq/L (ref 19–32)
Calcium: 10.5 mg/dL (ref 8.4–10.5)
Cholesterol: 213 mg/dL — ABNORMAL HIGH (ref 0–200)
Creatinine, Ser: 0.98 mg/dL (ref 0.40–1.20)
HDL: 112 mg/dL (ref >39)
Total Bilirubin: 0.4 mg/dL (ref 0.3–1.2)
Triglycerides: 42 mg/dL (ref <150)

## 2010-03-13 ENCOUNTER — Encounter: Payer: Self-pay | Admitting: Family Medicine

## 2010-03-16 ENCOUNTER — Ambulatory Visit (HOSPITAL_COMMUNITY)
Admission: RE | Admit: 2010-03-16 | Discharge: 2010-03-16 | Payer: Self-pay | Source: Home / Self Care | Attending: Family Medicine | Admitting: Family Medicine

## 2010-03-17 ENCOUNTER — Telehealth (INDEPENDENT_AMBULATORY_CARE_PROVIDER_SITE_OTHER): Payer: Self-pay | Admitting: *Deleted

## 2010-03-22 NOTE — Progress Notes (Signed)
Summary: NEEDS 1 MORE TEST FOR PORTABLE OXY.  Phone Note Call from Patient   Summary of Call: [facility] CALLED AND SAID THAT THERE WAS 3 TEST TO GET PORTABLE OXYGEN  1  SITTING DOWN 2  WALK 3  OXYGEN LEVEL DOWN AND GIVES OXYGEN TO PATIENT TO BRING IT BACK UP SO SHE NEEDS THE LAST TEST OF THIS DONE WILL COME IN TOMORROW TO GET THIS DONE IS IT OK Initial call taken by: Lind Guest,  February 22, 2009 2:49 PM  Follow-up for Phone Call        Coming in tomorrow at 4 Follow-up by: Everitt Amber,  February 23, 2009 4:18 PM

## 2010-03-22 NOTE — Medication Information (Signed)
Summary: Tax adviser   Imported By: Lind Guest 09/14/2009 11:37:54  _____________________________________________________________________  External Attachment:    Type:   Image     Comment:   External Document

## 2010-03-22 NOTE — Assessment & Plan Note (Signed)
Summary: WT LOSS,?CANCER,HX OF POLYPS/SS   Visit Type:  consult Referring Kristin Barcus:  Syliva Overman Primary Care Arienne Gartin:  Syliva Overman  Chief Complaint:  wt loss.  History of Present Illness: [name] is a pleasant 70 y/o WF, patient of Dr. [name], who presents for further evaluation of early satiety and weight loss.  She has had 15 pound weight loss since first of year. Initially thought wt loss due to change in diet. She was trying to eat healthy. Trying to eat smaller portions due to high chol. Now more early satiety. No indigestion/heartburn. Decreased carbonated beverages (Coke). Drinking more water. Stools have changed. H/O chronic constipation, chronic laxative use. Recently, having to increase dosages of Dulcolax. Stool diameter larger if no BM in several days. Currently takes Dulcoax 2, usually once per week. No melena, brbpr. No dysphagia.    Current Medications (verified): 1)  Calcium/vitamin D/minerals 600-200 Mg-Unit  Tabs (Calcium Carbonate-Vit D-Min) .... Take One Tablet By Mouth Twice A Day 2)  Vitamin E 400 Unit  Caps (Vitamin E) .... Take One Tablet By Mouth Once A Day 3)  Cartia Xt 240 Mg  Cp24 (Diltiazem Hcl Coated Beads) .... Take One Tablet By Mouth Once A Day 4)  Fish Oil 1000 Mg  Caps (Omega-3 Fatty Acids) .... Take Two Caplets Once A Ay 5)  Proventil Hfa 108 (90 Base) Mcg/act Aers (Albuterol Sulfate) .... 2 Puffs Every 4-6 Hours As Needed 6)  Adult Aspirin Ec Low Strength 81 Mg  Tbec (Aspirin) .... Take 1 Tablet By Mouth Once A Day 7)  Oxygen .... 2l At Bedtime 8)  Spiriva Handihaler 18 Mcg  Caps (Tiotropium Bromide Monohydrate) .... Inhale Contents of 1 Capsule Once A Day 9)  Advair Diskus 250-50 Mcg/dose  Misc (Fluticasone-Salmeterol) .... Use One Puff Twice A Day 10)  Flonase 50 Mcg/act Susp (Fluticasone Propionate) .Marland Kitchen.. 1 To2 Puffs Per Nostril Daily As Needed For Uncontrolled Allergies 11)  Trilipix 135 Mg Cpdr (Choline Fenofibrate) .... Take 1 Tab By  Mouth At Bedtime 12)  Claritin 10 Mg Tabs (Loratadine) .... Take 1 Tablet By Mouth Once A Day As Needed 13)  Ativan 0.5 Mg Tabs (Lorazepam) .... Half To One Tablet At Bedtime As Needed For Sleep 14)  Tessalon Perles 100 Mg Caps (Benzonatate) .... Take 1 Capsule By Mouth Two Times A Day 15)  Citalopram Hydrobromide 10 Mg Tabs (Citalopram Hydrobromide) .... Take 1 Tablet By Mouth Once A Day  Allergies (verified): 1)  ! Pcn 2)  ! Sulfa 3)  ! Wellbutrin 4)  ! * Statins 5)  ! Flexeril 6)  ! Levaquin  Past History:  Past Medical History: History of Rheumatic Fever Renal Insufficiency Aortic Stenosis s/p porcine valve replacement Hyperlipidemia Hypertension C O P D, bronchitis Osteoporosis Sebaceous cyst PUD, 30 years ago TCS, 6/10-->Single diminutive polyp in the rectum status post cold biopsy removal. (path benign) Status post right hemicolectomy. Colonic ulcers in the upstream end the colon distal to the anastomosis as described above. tatus post biopsy. The anastomosis itself and neoterminal ileum appeared normal. F/U TCS 5 yrs recommended. 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 Divorced Retired-RN Current Smoker-1/2  ppd x 50 years Alcohol use-no Drug use-no  Review of Systems General:  Complains of weight loss; denies fever, chills, sweats, anorexia, fatigue, and weakness. Eyes:  Denies vision loss. ENT:  Denies nasal congestion, sore throat, hoarseness, and difficulty swallowing. CV:  Complains of dyspnea on exertion; denies chest pains,  angina, palpitations, and peripheral edema. Resp:  Complains of dyspnea with exercise; denies dyspnea at rest, cough, and sputum. GI:  See HPI. GU:  Denies urinary burning and blood in urine. MS:  Denies joint pain / LOM. Derm:  Denies rash and itching. Neuro:  Denies weakness, frequent headaches, memory loss, and confusion. Psych:  Denies depression and anxiety. Endo:  Complains of unusual weight change. Heme:  Denies bruising and bleeding. Allergy:  Denies hives and rash.  Vital Signs:  Patient profile:   70 year old female Menstrual status:  hysterectomy Height:      61 inches Weight:      103 pounds BMI:     19.53 Temp:     98.2 degrees F oral Pulse rate:   92 / minute BP sitting:   122 / 78  (left arm) Cuff size:   regular  Vitals Entered By: Hendricks Limes LPN (September 29, 2009 11:00 AM)  Physical Exam  General:  Thin chronically ill appearing WF in NAD Head:  Normocephalic and atraumatic. Eyes:  sclera nonicteric Mouth:  Oropharyngeal mucosa moist, pink.  No lesions, erythema or exudate.    Neck:  Supple; no masses or thyromegaly. Lungs:  Decreased breath sounds throughout Heart:  Regular rate and rhythm; no murmurs, rubs,  or bruits. Abdomen:  Thin. Positive BS. Soft. NT. No HSM or masses. No abd bruit or hernia. Extremities:  No clubbing, cyanosis, edema or deformities noted. Neurologic:  Alert and  oriented x4;  grossly normal neurologically. Skin:  Intact without significant lesions or rashes. Cervical Nodes:  No significant cervical adenopathy. Psych:  Alert and cooperative. Normal mood and affect.  Impression & Recommendations:  Problem # 1:  LOSS OF WEIGHT (ICD-783.21)  Unexplained weight loss of 15 pounds since 1/11. Patient also c/o early satiety.  Subjective change in stools, increase use of laxatives, but using only two dulcolax per week. Last TCS one year ago with no evidence of malignancy. Benign polyp present. Discussed with the patient, given her early  satiety we will start w/u with EGD. She may eventually need CT A/P for weight loss. CXR and Chest CT already done-->pulmonary nodules and evidence of extensive calcifications including in coronary arteries. DDx also includes mesenteric ischemia.   Orders: Consultation Level IV (37106)  Problem # 2:  CONSTIPATION (ICD-564.00) Recommend Miralax 17 grams by mouth daily for constipation. Patient prefers to stick with current use of Dulcolax. Orders: Consultation Level IV (26948) I would like to thank Dr. [name] for allowing [place] to take part in the care of this nice patient.

## 2010-03-22 NOTE — Letter (Signed)
Summary: CT SCAN ORDER  CT SCAN ORDER   Imported By: Ave Filter 10/12/2009 18:12:57  _____________________________________________________________________  External Attachment:    Type:   Image     Comment:   External Document  Appended Document: CT SCAN ORDER I called the pt to set up CT Scan and she stated she was told by a doctor to never have any test with contrast unless it was life threatening..She states her kidneys can't handle it.Marland KitchenMarland KitchenShe had a  creatinine on 09/15/09 that was 0.98.Marland KitchenPlease advise?  Appended Document: CT SCAN ORDER addressed elsewhere in emr

## 2010-03-22 NOTE — Letter (Signed)
Summary: order to cancel chest ct with contrast  order to cancel chest ct with contrast   Imported By: Curtis Sites 09/23/2009 13:57:14  _____________________________________________________________________  External Attachment:    Type:   Image     Comment:   External Document

## 2010-03-22 NOTE — Miscellaneous (Signed)
Summary: CERTIFICATE OF MEDICAL NECESSITY  CERTIFICATE OF MEDICAL NECESSITY   Imported By: Lind Guest 07/21/2009 14:29:25  _____________________________________________________________________  External Attachment:    Type:   Image     Comment:   External Document

## 2010-03-22 NOTE — Assessment & Plan Note (Signed)
Summary: COPD, tobacco   Visit Type:  Follow-up Copy to:  Dr. Levy Pupa Primary Provider/Referring Provider:  Dr. Syliva Overman  CC:  COPD follow-up. The patient says there are no changes in her breathing. She is still smoking approx 1/4 ppd. Occasional cough with clear mucus prod....  History of Present Illness: 70 yo woman with tobacco use, dx COPD in late 90's based on PFT's at the time in [place], tissue AVR, HTN.   ROV 08/28/08 -- regular follow up visit. Having mucous production every day, able to mobilze, some yellow color but no blood . Activity level has been good, able to exert. Ususally doesn't have to stop to rest because of breathing. Able to lift boxes, empty trash, etc. Still smoking about 1/2 a pack a day.   ROV 12/30/08 -- Regular f/u visit today. Tells me that she was recently treated by Dr [name] for an exacerbation (10/14) with Pred. Was experiencing more cough, mucous plugs at the time. Nothing really changed - still with more DOE, D with bending, cough, coughs up some plugs. Taking Spiriva + Advair, using combivent as needed (about 2 x a day). She is still smoking 1/2 pk a day, but planning to cut down, not sure she is ready to set a quit date.   ROV 07/28/09 -- F/u for COPD. Has decreased cigarettes to 0.25 pk/day. She is staying busy, able to exert without SOB limiting her. On Spiriva + Advair, uses Proventil as needed, needs it ocass but not every day. On prednisone 2.5mg  once daily, being tapered down to zero. She has been treated for exacerbation x 1 since last visit, was in May '11.   Preventive Screening-Counseling & Management  Alcohol-Tobacco     Smoking Status: current     Packs/Day: 0.25  Current Medications (verified): 1)  Calcium/vitamin D/minerals 600-200 Mg-Unit  Tabs (Calcium Carbonate-Vit D-Min) .... Take One Tablet By Mouth Twice A Day 2)  Vitamin E 400 Unit  Caps (Vitamin E) .... Take One Tablet By Mouth Once A Day 3)  Cartia Xt 240 Mg  Cp24  (Diltiazem Hcl Coated Beads) .... Take One Tablet By Mouth Once A Day 4)  Fish Oil 1000 Mg  Caps (Omega-3 Fatty Acids) .... Take Two Caplets Once A Ay 5)  Proventil Hfa 108 (90 Base) Mcg/act Aers (Albuterol Sulfate) .... 2 Puffs Every 4-6 Hours As Needed 6)  Cozaar 50 Mg  Tabs (Losartan Potassium) .... Take 1/2  Tablet By Mouth As Needed 7)  Adult Aspirin Ec Low Strength 81 Mg  Tbec (Aspirin) .... Take 1 Tablet By Mouth Once A Day 8)  Oxygen .... 2l At Bedtime 9)  Spiriva Handihaler 18 Mcg  Caps (Tiotropium Bromide Monohydrate) .... Inhale Contents of 1 Capsule Once A Day 10)  Advair Diskus 250-50 Mcg/dose  Misc (Fluticasone-Salmeterol) .... Use One Puff Twice A Day 11)  Flonase 50 Mcg/act Susp (Fluticasone Propionate) .Marland Kitchen.. 1 To2 Puffs Per Nostril Daily As Needed For Uncontrolled Allergies 12)  Trilipix 135 Mg Cpdr (Choline Fenofibrate) .... Take 1 Tab By Mouth At Bedtime 13)  Claritin 10 Mg Tabs (Loratadine) .... Take 1 Tablet By Mouth Once A Day As Needed 14)  Ativan 0.5 Mg Tabs (Lorazepam) .... Half To One Tablet At Bedtime As Needed For Sleep 15)  Combivent 18-103 Mcg/act Aero (Ipratropium-Albuterol) .... Uad 16)  Prednisone 2.5 Mg Tabs (Prednisone) .... Take 1 Tablet By Mouth Once A Day  To Satrt05/31/2011 17)  Tessalon Perles 100 Mg Caps (Benzonatate) .... Take  1 Capsule By Mouth Two Times A Day  Allergies (verified): 1)  ! Pcn 2)  ! Sulfa 3)  ! Wellbutrin 4)  ! * Statins 5)  ! Flexeril 6)  ! Levaquin  Social History: Packs/Day:  0.25  Vital Signs:  Patient profile:   70 year old female Menstrual status:  hysterectomy Height:      61 inches (154.94 cm) Weight:      106 pounds (48.18 kg) BMI:     20.10 O2 Sat:      92 % on Room air Temp:     98.1 degrees F (36.72 degrees C) oral Pulse rate:   102 / minute BP sitting:   124 / 70  (left arm) Cuff size:   regular  Vitals Entered By: Michel Bickers CMA (July 28, 2009 9:49 AM)  O2 Sat at Rest %:  92 O2 Flow:  Room air CC:  COPD follow-up. The patient says there are no changes in her breathing. She is still smoking approx 1/4 ppd. Occasional cough with clear mucus prod... Comments Medications reviewed. Daytime phone verified. Michel Bickers CMA  July 28, 2009 9:50 AM   Physical Exam  General:  well developed, well nourished, in no acute distress Eyes:  conjunctiva and sclera clear Nose:  no external erythema and no nasal discharge.   Mouth:  no deformity or lesions Neck:  No deformities, masses, or tenderness noted. Lungs:  bilateral low-pitched exp wheezes.  Heart:  Normal rate and regular rhythm. 2/6 syst M Abdomen:  not examined Msk:  Decreased ROM thoracolumbar spine Extremities:  no clubbing, cyanosis, edema, or deformity noted Neurologic:  non-focal Skin:  intact without lesions or rashes Cervical Nodes:  no significant adenopathy Psych:  alert and cooperative; normal mood and affect; normal attention span and concentration   Impression & Recommendations:  Problem # 1:  CHRONIC OBSTRUCTIVE PULMONARY DISEASE (ICD-496) Stable post recent exacerbation, weaning off pred  Problem # 2:  NICOTINE ADDICTION (ICD-305.1)  Trying to cut down, still not ready to set absolute quit date with me  Orders: Est. Patient Level IV (16109) Tobacco use cessation intermediate 3-10 minutes (60454)  Patient Instructions: 1)  Stay on your Spiriva and Advair 2)  Use Proventil as needed  3)  Stop combivent 4)  Taper your prednisone to off as planned.  5)  Continue your efforts to stop smoking 6)  Follow up with Dr Delton Coombes in 6 months or as needed

## 2010-03-22 NOTE — Progress Notes (Signed)
Summary: pre cert for CT A/P with contrast  Phone Note Other Incoming   Summary of Call: pt needs to be set up for a CT ABD/PELVIS W/ CONTRAST per RMR.  Early satiety,wt loss and negative EGD. Pt has medicare and [employer] and will need pre-cert. Initial call taken by: Diana Eves,  October 12, 2009 11:30 AM     Appended Document: pre cert for CT A/P with contrast Pt called and said she was told by Dr. Mckinley Jewel to never have contrast unless it was a "live threatening emergency".   Appended Document: pre cert for CT A/P with contrast lets get oral contract only abd and pelvic CT  Appended Document: pre cert for CT A/P with contrast Pt scheduled for ct scan of abd/pelvis oral contrast only for 10/20/09@1 :45pm

## 2010-03-22 NOTE — Progress Notes (Signed)
Summary: please advise   Phone Note Call from Patient   Summary of Call: patient called back, and states the Health Dept. sent [place] a request for this information.  She needs to know if she can have this procedure done, her appt. is on the 25th. of this month.  Please advise.  Please look at the phone note from the 14th named Tooth.   Initial call taken by: Curtis Sites,  December 07, 2009 8:58 AM  Follow-up for Phone Call        patient needs to know if she needs antibiotic coverage for the dental procedure Follow-up by: Adella Hare LPN,  December 08, 2009 3:27 PM  Additional Follow-up for Phone Call Additional follow up Details #1::        py aware she needs amox before dental procedure, staes shewill have mpore than 1 procedure , script sent in with refills Additional Follow-up by: Syliva Overman MD,  December 09, 2009 10:02 AM    New/Updated Medications: AMOXICILLIN 500 MG CAPS (AMOXICILLIN) 4 tablets by mouth, one hour before the procedure Prescriptions: AMOXICILLIN 500 MG CAPS (AMOXICILLIN) 4 tablets by mouth, one hour before the procedure  #3 x 3   Entered and Authorized by:   Syliva Overman MD   Signed by:   Syliva Overman MD on 12/09/2009   Method used:   Electronically to        [facility] [phone]* (retail)       [address]       [place], [place]  [address]       Ph: [phone] or [phone]       Fax: [phone]   RxID:   [id number]

## 2010-03-22 NOTE — Assessment & Plan Note (Signed)
Summary: follow up from hos   Vital Signs:  Patient profile:   70 year old female Menstrual status:  hysterectomy Height:      62 inches Weight:      118 pounds BMI:     21.66 O2 Sat:      89 % Pulse rate:   92 / minute Pulse rhythm:   regular Resp:     16 per minute BP sitting:   124 / 72 Cuff size:   regular  Vitals Entered By: Everitt Amber (February 22, 2009 9:41 AM) CC: has been having problems breathing, had an attack and had to go to ER for 3 hrs for treatments, did an ekg and chest xray   Primary Care Provider:  Berenice Primas  CC:  has been having problems breathing, had an attack and had to go to ER for 3 hrs for treatments, and did an ekg and chest xray.  History of Present Illness: Pt reports that the week before christmas she started wheezing,SHE  used her inhalers more with relief.  she states this past Saturday when trying to go to a family party she absolutely could not breathe felt obstructed.... No fever , chills or sputum.when she does get up sputum thick and cream.. She feels better than she did this past weekend but is still not ack to baseline, reporting more exertional faTIGUE THAN USUAL. She states that she is absolutely going to discontinue smoking at this time, because she realises that is is messing with her health in a very negative manner.  Preventive Screening-Counseling & Management  Alcohol-Tobacco     Smoking Status: current     Smoking Cessation Counseling: yes  Comments: down to 10 a day  Current Medications (verified): 1)  Calcium/vitamin D/minerals 600-200 Mg-Unit  Tabs (Calcium Carbonate-Vit D-Min) .... Take One Tablet By Mouth Twice A Day 2)  Vitamin E 400 Unit  Caps (Vitamin E) .... Take One Tablet By Mouth Once A Day 3)  Cartia Xt 240 Mg  Cp24 (Diltiazem Hcl Coated Beads) .... Take One Tablet By Mouth Once A Day 4)  Fish Oil 1000 Mg  Caps (Omega-3 Fatty Acids) .... Take Two Caplets Once A Ay 5)  Combivent 103-18 Mcg/act  Aero  (Ipratropium-Albuterol) .... Two Puffs Every Six Hours As Needed 6)  Cozaar 50 Mg  Tabs (Losartan Potassium) .... Take 1 Tablet By Mouth Once A Day 7)  Adult Aspirin Ec Low Strength 81 Mg  Tbec (Aspirin) .... Take 1 Tablet By Mouth Once A Day 8)  Oxygen .... 2l At Bedtime 9)  Spiriva Handihaler 18 Mcg  Caps (Tiotropium Bromide Monohydrate) .... Inhale Contents of 1 Capsule Once A Day 10)  Advair Diskus 250-50 Mcg/dose  Misc (Fluticasone-Salmeterol) .... Use One Puff Twice A Day 11)  Flonase 50 Mcg/act Susp (Fluticasone Propionate) .Marland Kitchen.. 1 To2 Puffs Per Nostril Daily As Needed For Uncontrolled Allergies 12)  Restoril 15 Mg Caps (Temazepam) .... Take 1 Capsule By Mouth At Bedtime 13)  Trilipix 135 Mg Cpdr (Choline Fenofibrate) .... Take 1 Tab By Mouth At Bedtime 14)  Claritin 10 Mg Tabs (Loratadine) .... Take 1 Tablet By Mouth Once A Day As Needed  Allergies (verified): 1)  ! Pcn 2)  ! Sulfa 3)  ! Wellbutrin 4)  ! * Statins 5)  ! Flexeril 6)  ! Levaquin  Review of Systems      See HPI General:  Complains of fatigue, malaise, and sleep disorder; pt complains of difficulty falling and  staying asleep, requests medication for this. Eyes:  Denies blurring and discharge. ENT:  Denies hoarseness, nasal congestion, postnasal drainage, sinus pressure, and sore throat. CV:  Complains of shortness of breath with exertion; denies chest pain or discomfort, palpitations, and swelling of feet. Resp:  See HPI; Complains of cough, shortness of breath, sputum productive, and wheezing. GI:  Denies abdominal pain, constipation, diarrhea, nausea, and vomiting. GU:  Denies dysuria and urinary frequency. MS:  Denies joint pain. Neuro:  Denies headaches and seizures. Psych:  Complains of anxiety; denies depression, sense of great danger, suicidal thoughts/plans, thoughts of violence, and unusual visions or sounds. Endo:  Denies cold intolerance, excessive hunger, excessive thirst, excessive urination, heat  intolerance, polyuria, and weight change. Heme:  Denies abnormal bruising and bleeding. Allergy:  Complains of seasonal allergies.  Physical Exam  General:  Well-developed,adequately nourished,in no acute distress; alert,appropriate and cooperative throughout examination. Pt dyspneic with activity HEENT: No facial asymmetry,  EOMI, No sinus tenderness, TM's Clear, oropharynx  pink and moist.   Chest: decreased air entry, bilateral wheezes, no crackles  CVS: S1, S2, No murmurs, No S3.   Abd: Soft, Nontender.  MS: Adequate ROM spine, hips, shoulders and knees.  Ext: No edema.   CNS: CN 2-12 intact, power tone and sensation normal throughout.   Skin: Intact, no visible lesions or rashes.  Psych: Good eye contact, normal affect.  Memory intact, not anxious or depressed appearing.    Impression & Recommendations:  Problem # 1:  WHEEZING (ICD-786.07) Assessment Deteriorated  Orders: Depo- Medrol 80mg  (J1040) Admin of Therapeutic Inj  intramuscular or subcutaneous ([address])  Problem # 2:  DEPRESSION (ICD-311) Assessment: Improved  The following medications were removed from the medication list:    Fluoxetine Hcl 10 Mg Caps (Fluoxetine hcl) .Marland Kitchen... Take 1 capsule by mouth once a day  Problem # 3:  HYPERLIPIDEMIA (ICD-272.4) Assessment: Comment Only  Her updated medication list for this problem includes:    Trilipix 135 Mg Cpdr (Choline fenofibrate) .Marland Kitchen... Take 1 tab by mouth at bedtime  Labs Reviewed: SGOT: 15 (12/01/2008)   SGPT: 10 (12/01/2008)   HDL:77 (12/01/2008), 74 (06/10/2008)  LDL:151 (12/01/2008), 88 (60/45/4098)  Chol:241 (12/01/2008), 174 (06/10/2008)  Trig:65 (12/01/2008), 60 (06/10/2008)  Problem # 4:  ESSENTIAL HYPERTENSION, BENIGN (ICD-401.1) Assessment: Unchanged  Her updated medication list for this problem includes:    Cartia Xt 240 Mg Cp24 (Diltiazem hcl coated beads) .Marland Kitchen... Take one tablet by mouth once a day    Cozaar 50 Mg Tabs (Losartan potassium) .Marland Kitchen...  Take 1 tablet by mouth once a day  BP today: 124/72 Prior BP: 128/60 (12/30/2008)  Labs Reviewed: K+: 4.8 (12/01/2008) Creat: : 1.22 (12/01/2008)   Chol: 241 (12/01/2008)   HDL: 77 (12/01/2008)   LDL: 151 (12/01/2008)   TG: 65 (12/01/2008)  Problem # 5:  CHRONIC OBSTRUCTIVE PULMONARY DISEASE (ICD-496) Assessment: Deteriorated  The following medications were removed from the medication list:    Proventil Hfa 108 (90 Base) Mcg/act Aers (Albuterol sulfate) ..... Inhale 2 puffs every 6 hours as needed Her updated medication list for this problem includes:    Combivent 103-18 Mcg/act Aero (Ipratropium-albuterol) .Marland Kitchen..Marland Kitchen Two puffs every six hours as needed    Spiriva Handihaler 18 Mcg Caps (Tiotropium bromide monohydrate) ..... Inhale contents of 1 capsule once a day    Advair Diskus 250-50 Mcg/dose Misc (Fluticasone-salmeterol) ..... Use one puff twice a day  Problem # 6:  INSOMNIA (ICD-780.52) Assessment: Improved  Her updated medication list for this problem  includes:    Restoril 15 Mg Caps (Temazepam) .Marland Kitchen... Take 1 capsule by mouth at bedtime  Problem # 7:  NICOTINE ADDICTION (ICD-305.1) Assessment: Unchanged  Encouraged smoking cessation and discussed different methods for smoking cessation.   Complete Medication List: 1)  Calcium/vitamin D/minerals 600-200 Mg-unit Tabs (Calcium carbonate-vit d-min) .... Take one tablet by mouth twice a day 2)  Vitamin E 400 Unit Caps (Vitamin e) .... Take one tablet by mouth once a day 3)  Cartia Xt 240 Mg Cp24 (Diltiazem hcl coated beads) .... Take one tablet by mouth once a day 4)  Fish Oil 1000 Mg Caps (Omega-3 fatty acids) .... Take two caplets once a ay 5)  Combivent 103-18 Mcg/act Aero (Ipratropium-albuterol) .... Two puffs every six hours as needed 6)  Cozaar 50 Mg Tabs (Losartan potassium) .... Take 1 tablet by mouth once a day 7)  Adult Aspirin Ec Low Strength 81 Mg Tbec (Aspirin) .... Take 1 tablet by mouth once a day 8)  Oxygen  .... 2l  at bedtime 9)  Spiriva Handihaler 18 Mcg Caps (Tiotropium bromide monohydrate) .... Inhale contents of 1 capsule once a day 10)  Advair Diskus 250-50 Mcg/dose Misc (Fluticasone-salmeterol) .... Use one puff twice a day 11)  Flonase 50 Mcg/act Susp (Fluticasone propionate) .Marland Kitchen.. 1 to2 puffs per nostril daily as needed for uncontrolled allergies 12)  Restoril 15 Mg Caps (Temazepam) .... Take 1 capsule by mouth at bedtime 13)  Trilipix 135 Mg Cpdr (Choline fenofibrate) .... Take 1 tab by mouth at bedtime 14)  Claritin 10 Mg Tabs (Loratadine) .... Take 1 tablet by mouth once a day as needed 15)  Prednisone (pak) 10 Mg Tabs (Prednisone) .... Use as directed  Patient Instructions: 1)  Please schedule a follow-up appointment in 6 weeks 2)  Tobacco is very bad for your health and your loved ones! You Should stop smoking!. 3)  Stop Smoking Tips: Choose a Quit date. Cut down before the Quit date. decide what you will do as a substitute when you feel the urge to smoke(gum,toothpick,exercise). 4)  your oxygen with activity is 885 i will try to get you on supplemental oxygen. 5)  prednisone  dose pack is sent in Prescriptions: RESTORIL 15 MG CAPS (TEMAZEPAM) Take 1 capsule by mouth at bedtime  #30 x 4   Entered by:   Everitt Amber   Authorized by:   Syliva Overman MD   Signed by:   Everitt Amber on 02/22/2009   Method used:   Printed then [facility]ed [facility] ...       [facility]  [address] [phone][facility] (retail)       [address]       [place], [place]  [address]       Ph: [phone] or [phone]       Fax: [phone]   RxID:   [id number] COZAAR 50 MG  TABS (LOSARTAN POTASSIUM) Take 1 tablet by mouth once a day  #30 x 4   Entered by:   Everitt Amber   Authorized by:   Syliva Overman MD   Signed by:   Everitt Amber on 02/22/2009   Method used:   Printed then faxed [facility] ...       [facility]  [address] [phone]* (retail)       [address]       [place], [place]  [address]       Ph:  [phone] or [phone]       Fax: [phone]   RxID:   [id number] CARTIA XT 240 MG  CP24 (DILTIAZEM HCL COATED BEADS) Take one tablet by mouth once a day  #30 x 4   Entered by:   Everitt Amber   Authorized by:   Syliva Overman MD   Signed by:   Everitt Amber on 02/22/2009   Method used:   Printed then faxed [facility] ...       [facility]  [address] [phone]* (retail)       [address]       [place], [place]  [address]       Ph: [phone] or [phone]       Fax: [phone]   RxID:   [id number] PREDNISONE (PAK) 10 MG TABS (PREDNISONE) Use as directed  #42 x 0   Entered and Authorized by:   Syliva Overman MD   Signed by:   Syliva Overman MD on 02/22/2009   Method used:   Electronically to        [facility]. [phone]* (retail)       [address]       [place], [place]  [address]       Ph: [phone] or [phone]       Fax: [phone]   RxID:   [id number]    Medication Administration  Injection # 1:    Medication: Depo- Medrol 80mg     Diagnosis: WHEEZING (ICD-786.07)    Route: IM    Site: RUOQ gluteus    Exp Date: 08/2010    Lot #: [id number]    Mfr: [employer]    Comments: 80 mg given    Patient tolerated injection without complications    Given by: Everitt Amber (February 22, 2009 11:48 AM)  Orders Added: 1)  Est. Patient Level IV [01751] 2)  Depo- Medrol 80mg  [J1040] 3)  Admin of Therapeutic Inj  intramuscular or subcutaneous [[address]]

## 2010-03-22 NOTE — Progress Notes (Signed)
Summary: rx  Phone Note Call from Patient   Summary of Call: pt would like to speak with nurse about getting a refill on predisone [phone] Initial call taken by: Rudene Anda,  May 14, 2009 9:58 AM  Follow-up for Phone Call        patient would like refill on prednisone pak and can it be increased to 10mg  instead of 5mg  Follow-up by: Adella Hare LPN,  May 14, 2009 11:21 AM  Additional Follow-up for Phone Call Additional follow up Details #1::        pls advise this one time only dose increased to 10mg  . Encourage and remind her of the need to quit smoking to improve her breathing. Let her know becuse of the adverse sid effects of prednisone the lowest effective dose is what is needed. I have sent iun the script let her know Additional Follow-up by: Syliva Overman MD,  May 14, 2009 4:08 PM    Additional Follow-up for Phone Call Additional follow up Details #2::    Patient reminded she needs to quit smoking to improve breathig and that rx is sent in  Follow-up by: Everitt Amber LPN,  May 14, 2009 4:12 PM  New/Updated Medications: PREDNISONE (PAK) 10 MG TABS (PREDNISONE) Use as directed] Prescriptions: PREDNISONE (PAK) 10 MG TABS (PREDNISONE) Use as directed]  #21 x 0   Entered and Authorized by:   Syliva Overman MD   Signed by:   Syliva Overman MD on 05/14/2009   Method used:   Electronically to        [facility] [phone]* (retail)       [address]       [place], [place]  [address]       Ph: [phone] or [phone]       Fax: [phone]   RxID:   [id number]

## 2010-03-22 NOTE — Medication Information (Signed)
Summary: Tax adviser   Imported By: Lind Guest 04/06/2009 14:11:25  _____________________________________________________________________  External Attachment:    Type:   Image     Comment:   External Document

## 2010-03-22 NOTE — Medication Information (Signed)
Summary: Tax adviser   Imported By: Lind Guest 04/09/2009 15:30:35  _____________________________________________________________________  External Attachment:    Type:   Image     Comment:   External Document

## 2010-03-22 NOTE — Letter (Signed)
Summary: EGD order   EGD order   Imported By: Peggyann Shoals 09/29/2009 11:56:46  _____________________________________________________________________  External Attachment:    Type:   Image     Comment:   External Document

## 2010-03-22 NOTE — Assessment & Plan Note (Signed)
Summary: office visit   Vital Signs:  Patient profile:   70 year old female Menstrual status:  hysterectomy Height:      62 inches Weight:      109.75 pounds BMI:     20.15 O2 Sat:      86 % Pulse rate:   74 / minute Pulse rhythm:   regular Resp:     16 per minute BP sitting:   110 / 60  (right arm) Cuff size:   regular  Vitals Entered ByMarland Kitchen Everitt Amber (April 05, 2009 11:44 AM) CC: Still having trouble breathing Is Patient Diabetic? No Pain Assessment Patient in pain? no        Primary Care Provider:  Berenice Primas  CC:  Still having trouble breathing.  History of Present Illness: 2 week h/o increased exertional dyspnea which appears to be progressing accompanied by wheezing. She denies fever or chills , she does note thick sputum. She reports intolerance to restoril, states she has used an anxiolytic in the past and feels this would be more beneficial if I would trust her with it.   Allergies: 1)  ! Pcn 2)  ! Sulfa 3)  ! Wellbutrin 4)  ! * Statins 5)  ! Flexeril 6)  ! Levaquin  Review of Systems      See HPI General:  Complains of fatigue, malaise, and sleep disorder; denies chills and fever; poor response to restoril, gave her a hangover. ENT:  Denies hoarseness, nasal congestion, and sinus pressure. CV:  Denies chest pain or discomfort, palpitations, and swelling of feet. Resp:  Complains of shortness of breath, sputum productive, and wheezing; 2 week h/o yellow gold sputum. GI:  Denies abdominal pain, constipation, diarrhea, nausea, and vomiting. GU:  Denies dysuria and urinary frequency. MS:  Complains of joint pain. Derm:  Denies itching and rash. Neuro:  Denies headaches, tingling, and tremors. Psych:  Complains of anxiety and depression. Endo:  Denies cold intolerance, excessive hunger, excessive thirst, excessive urination, heat intolerance, polyuria, and weight change. Heme:  Denies abnormal bruising and bleeding. Allergy:  Complains of seasonal  allergies.  Physical Exam  General:  Well-developed,adequately nourished,in no acute distress; alert,appropriate and cooperative throughout examination. Pt dyspneic with activity and a t rest HEENT: No facial asymmetry,  EOMI, No sinus tenderness, TM's Clear, oropharynx  pink and moist.   Chest: decreased air entry, bilateral wheezes, no crackles  CVS: S1, S2, No murmurs, No S3.   Abd: Soft, Nontender.  MS: Adequate ROM spine, hips, shoulders and knees.  Ext: No edema.   CNS: CN 2-12 intact, power tone and sensation normal throughout.   Skin: Intact, no visible lesions or rashes.  Psych: Good eye contact, normal affect.  Memory intact, not anxious or depressed appearing.    Impression & Recommendations:  Problem # 1:  WHEEZING (ICD-786.07) Assessment Deteriorated  Problem # 2:  CHRONIC OBSTRUCTIVE PULMONARY DISEASE (ICD-496) Assessment: Deteriorated  Her updated medication list for this problem includes:    Proventil Hfa 108 (90 Base) Mcg/act Aers (Albuterol sulfate) .Marland Kitchen... 2 puffs every 4-6 hours as needed    Spiriva Handihaler 18 Mcg Caps (Tiotropium bromide monohydrate) ..... Inhale contents of 1 capsule once a day    Advair Diskus 250-50 Mcg/dose Misc (Fluticasone-salmeterol) ..... Use one puff twice a day room air oxygen at rest is 86% whuich will qualify pt for supplemental oxygen  Problem # 3:  ESSENTIAL HYPERTENSION, BENIGN (ICD-401.1) Assessment: Unchanged  Her updated medication list for this problem includes:  Cartia Xt 240 Mg Cp24 (Diltiazem hcl coated beads) .Marland Kitchen... Take one tablet by mouth once a day    Cozaar 50 Mg Tabs (Losartan potassium) .Marland Kitchen... Take 1 tablet by mouth once a day  BP today: 110/60 Prior BP: 124/72 (02/22/2009)  Labs Reviewed: K+: 4.8 (12/01/2008) Creat: : 1.22 (12/01/2008)   Chol: 241 (12/01/2008)   HDL: 77 (12/01/2008)   LDL: 151 (12/01/2008)   TG: 65 (12/01/2008)  Problem # 4:  INSOMNIA (ICD-780.52) Assessment: Deteriorated  Her  updated medication list for this problem includes:    Restoril 15 Mg Caps (Temazepam) .Marland Kitchen... Take 1 capsule by mouth at bedtime  Discussed sleep hygiene.  changed to ativan  Complete Medication List: 1)  Calcium/vitamin D/minerals 600-200 Mg-unit Tabs (Calcium carbonate-vit d-min) .... Take one tablet by mouth twice a day 2)  Vitamin E 400 Unit Caps (Vitamin e) .... Take one tablet by mouth once a day 3)  Cartia Xt 240 Mg Cp24 (Diltiazem hcl coated beads) .... Take one tablet by mouth once a day 4)  Fish Oil 1000 Mg Caps (Omega-3 fatty acids) .... Take two caplets once a ay 5)  Proventil Hfa 108 (90 Base) Mcg/act Aers (Albuterol sulfate) .... 2 puffs every 4-6 hours as needed 6)  Cozaar 50 Mg Tabs (Losartan potassium) .... Take 1 tablet by mouth once a day 7)  Adult Aspirin Ec Low Strength 81 Mg Tbec (Aspirin) .... Take 1 tablet by mouth once a day 8)  Oxygen  .... 2l at bedtime 9)  Spiriva Handihaler 18 Mcg Caps (Tiotropium bromide monohydrate) .... Inhale contents of 1 capsule once a day 10)  Advair Diskus 250-50 Mcg/dose Misc (Fluticasone-salmeterol) .... Use one puff twice a day 11)  Flonase 50 Mcg/act Susp (Fluticasone propionate) .Marland Kitchen.. 1 to2 puffs per nostril daily as needed for uncontrolled allergies 12)  Restoril 15 Mg Caps (Temazepam) .... Take 1 capsule by mouth at bedtime 13)  Trilipix 135 Mg Cpdr (Choline fenofibrate) .... Take 1 tab by mouth at bedtime 14)  Claritin 10 Mg Tabs (Loratadine) .... Take 1 tablet by mouth once a day as needed 15)  Prednisone (pak) 5 Mg Tabs (Prednisone) .... Use as directed 16)  Tessalon Perles 100 Mg Caps (Benzonatate) .... Take 1 capsule by mouth three times a day 17)  Erythromycin Base 500 Mg Tabs (Erythromycin base) .... Take 1 tablet by mouth three times a day 18)  Ativan 0.5 Mg Tabs (Lorazepam) .... Half to one tablet at bedtime as needed for sleep  Patient Instructions: 1)  Please schedule a follow-up appointment in 3 months. 2)  You are  being treated for COPD exaccerbation with antibiotics, and steroids. 3)  Since your oxygen is in the 80's  you do qualify for the oxygen abnd we will try and get this througjh again. 4)  I do recommend that you call pulmonary and cardiology again. 5)  new med for sleep sent in discontinue restoril since you do not tolerate  it well Prescriptions: ATIVAN 0.5 MG TABS (LORAZEPAM) half to one tablet at bedtime as needed for sleep  #30 x 3   Entered and Authorized by:   Syliva Overman MD   Signed by:   Syliva Overman MD on 04/05/2009   Method used:   Printed then faxed to ...       [facility] [phone]* (retail)       [address]       [place],  [place]  [address]       Ph: [phone] or [phone]       Fax: [phone]   RxID:   [id number] ERYTHROMYCIN BASE 500 MG TABS (ERYTHROMYCIN BASE) Take 1 tablet by mouth three times a day  #30 x 0   Entered and Authorized by:   Syliva Overman MD   Signed by:   Syliva Overman MD on 04/05/2009   Method used:   [facility]        [facility]  [address] [phone]* (retail)       [address]       [place], [place]  [address]       Ph: [phone] or [phone]       Fax: [phone]   RxID:   [id number] TESSALON PERLES 100 MG CAPS (BENZONATATE) Take 1 capsule by mouth three times a day  #30 x 0   Entered and Authorized by:   Syliva Overman MD   Signed by:   Syliva Overman MD on 04/05/2009   Method used:   Electronically to        [facility]. [phone]* (retail)       [address]       [place], [place]  [address]       Ph: [phone] or [phone]       Fax: [phone]   RxID:   [id number] PREDNISONE (PAK) 5 MG TABS (PREDNISONE) Use as directed  #21 x 0   Entered and Authorized by:   Syliva Overman MD   Signed by:   Syliva Overman MD on 04/05/2009   Method used:   Electronically to        [facility]. [phone]* (retail)       [address]       [place], [place]  [address]       Ph: [phone] or [phone]       Fax: [phone]   RxID:   [id number]

## 2010-03-22 NOTE — Assessment & Plan Note (Signed)
Summary: office visit   Vital Signs:  Patient profile:   70 year old female Menstrual status:  hysterectomy Height:      61 inches Weight:      102.75 pounds BMI:     19.48 O2 Sat:      97 % Pulse rate:   87 / minute Pulse rhythm:   regular Resp:     16 per minute BP sitting:   130 / 80  (left arm) Cuff size:   regular  Vitals Entered By: Everitt Amber LPN (November 01, 2009 10:52 AM) CC: Follow up chronic problems   Primary Care Joury Allcorn:  Syliva Overman  CC:  Follow up chronic problems.  History of Present Illness: Reports  that she has been  doing well. She is feeling much better as the time draws near to get help with her depessed sister. She also had a thorough gI eval recntly, which was negative, and this is clearly reassuring. She does have a pulmonary nodule which will need radiology f/u in jan,she is again encouraged to quit smoking,.. Denies recent fever or chills. Denies sinus pressure, nasal congestion , ear pain or sore throat. Denies chest congestion, or cough productive of sputum. Denies chest pain, palpitations, PND, orthopnea or leg swelling. Denies  nausea, vomitting, diarrhea or constipation.she is having probs with gERD die to drug interaction and will get bacjl with gI about this. Denies change in bowel movements or bloody stool. Denies dysuria , frequency, incontinence or hesitancy. Denies  joint pain, swelling, or reduced mobility. Denies headaches, vertigo, seizures. Reports improvement in  depression, anxiety and insomnia.      Preventive Screening-Counseling & Management  Alcohol-Tobacco     Smoking Cessation Counseling: yes  Current Medications (verified): 1)  Calcium/vitamin D/minerals 600-200 Mg-Unit  Tabs (Calcium Carbonate-Vit D-Min) .... Take One Tablet By Mouth Twice A Day 2)  Vitamin E 400 Unit  Caps (Vitamin E) .... Take One Tablet By Mouth Once A Day 3)  Cartia Xt 240 Mg  Cp24 (Diltiazem Hcl Coated Beads) .... Take One Tablet By  Mouth Once A Day 4)  Fish Oil 1000 Mg  Caps (Omega-3 Fatty Acids) .... Take Two Caplets Once A Ay 5)  Proventil Hfa 108 (90 Base) Mcg/act Aers (Albuterol Sulfate) .... 2 Puffs Every 4-6 Hours As Needed 6)  Adult Aspirin Ec Low Strength 81 Mg  Tbec (Aspirin) .... Take 1 Tablet By Mouth Once A Day 7)  Oxygen .... 2l At Bedtime 8)  Spiriva Handihaler 18 Mcg  Caps (Tiotropium Bromide Monohydrate) .... Inhale Contents of 1 Capsule Once A Day 9)  Advair Diskus 250-50 Mcg/dose  Misc (Fluticasone-Salmeterol) .... Use One Puff Twice A Day 10)  Flonase 50 Mcg/act Susp (Fluticasone Propionate) .Marland Kitchen.. 1 To2 Puffs Per Nostril Daily As Needed For Uncontrolled Allergies 11)  Trilipix 135 Mg Cpdr (Choline Fenofibrate) .... Take 1 Tab By Mouth At Bedtime 12)  Claritin 10 Mg Tabs (Loratadine) .... Take 1 Tablet By Mouth Once A Day As Needed 13)  Ativan 0.5 Mg Tabs (Lorazepam) .... Half To One Tablet At Bedtime As Needed For Sleep 14)  Citalopram Hydrobromide 10 Mg Tabs (Citalopram Hydrobromide) .... Take 1 Tablet By Mouth Once A Day  Allergies (verified): 1)  ! Pcn 2)  ! Sulfa 3)  ! Wellbutrin 4)  ! * Statins 5)  ! Flexeril 6)  ! Levaquin  Review of Systems      See HPI General:  Complains of fatigue. Eyes:  Denies discharge, eye  pain, and red eye. Resp:  Complains of cough and shortness of breath. MS:  Complains of joint pain, low back pain, and mid back pain. Derm:  Complains of lesion(s) and rash; puriticpigmented lesion on left cheek x months. Psych:  Complains of anxiety and depression; denies easily tearful, irritability, mental problems, suicidal thoughts/plans, thoughts of violence, and unusual visions or sounds; improved symptoms on citalopram which she is tolerating well, and she has actually gained 12 ounces. Endo:  Denies excessive thirst and excessive urination. Heme:  Denies bleeding. Allergy:  Complains of seasonal allergies.  Physical Exam  General:  Well-developed,,in no acute  distress; mildly underweight,alert,appropriate and cooperative throughout examination. Looks better, less anxious and depressed appearing HEENT: No facial asymmetry,  EOMI, No sinus tenderness, TM's Clear, oropharynx  pink and moist.   Chest: Clear to auscultation bilaterally.  CVS: S1, S2, No murmurs, No S3.   Abd: Soft, Nontender.  MS: Adequate ROM spine, hips, shoulders and knees.  Ext: No edema.   CNS: CN 2-12 intact, power tone and sensation normal throughout.   Skin: Intact, hyperpigmented raised papular lesionon left cheek. Psych: Good eye contact, normal affect.  Memory intact, not anxious or depressed appearing.    Impression & Recommendations:  Problem # 1:  SKIN LESION (ICD-709.9) Assessment Comment Only Pt will call dernmtology for her appt to have this evaluated  Problem # 2:  LOSS OF WEIGHT (ICD-783.21) Assessment: Improved Pt advised to start a daily supplement  Problem # 3:  ESSENTIAL HYPERTENSION, BENIGN (ICD-401.1) Assessment: Unchanged  Her updated medication list for this problem includes:    Cartia Xt 240 Mg Cp24 (Diltiazem hcl coated beads) .Marland Kitchen... Take one tablet by mouth once a day  BP today: 130/80 Prior BP: 122/78 (09/29/2009)  Labs Reviewed: K+: 4.0 (10/23/2009) Creat: : 1.10 (10/23/2009)   Chol: 200 (10/23/2009)   HDL: 81 (10/23/2009)   LDL: 105 (10/23/2009)   TG: 69 (10/23/2009)  Problem # 4:  HYPERLIPIDEMIA (ICD-272.4) Assessment: Unchanged  Her updated medication list for this problem includes:    Trilipix 135 Mg Cpdr (Choline fenofibrate) .Marland Kitchen... Take 1 tab by mouth at bedtime  Orders: T-Hepatic Function [phone]) T-Lipid Profile [phone])  Labs Reviewed: SGOT: 14 (10/23/2009)   SGPT: 8 (10/23/2009)   HDL:81 (10/23/2009), 82 (07/06/2009)  LDL:105 (10/23/2009), 107 (07/06/2009)  Chol:200 (10/23/2009), 200 (07/06/2009)  Trig:69 (10/23/2009), 54 (07/06/2009) Low fat diet discussed and encouraged, and literature also given  Complete  Medication List: 1)  Calcium/vitamin D/minerals 600-200 Mg-unit Tabs (Calcium carbonate-vit d-min) .... Take one tablet by mouth twice a day 2)  Vitamin E 400 Unit Caps (Vitamin e) .... Take one tablet by mouth once a day 3)  Cartia Xt 240 Mg Cp24 (Diltiazem hcl coated beads) .... Take one tablet by mouth once a day 4)  Fish Oil 1000 Mg Caps (Omega-3 fatty acids) .... Take two caplets once a ay 5)  Proventil Hfa 108 (90 Base) Mcg/act Aers (Albuterol sulfate) .... 2 puffs every 4-6 hours as needed 6)  Adult Aspirin Ec Low Strength 81 Mg Tbec (Aspirin) .... Take 1 tablet by mouth once a day 7)  Oxygen  .... 2l at bedtime 8)  Spiriva Handihaler 18 Mcg Caps (Tiotropium bromide monohydrate) .... Inhale contents of 1 capsule once a day 9)  Advair Diskus 250-50 Mcg/dose Misc (Fluticasone-salmeterol) .... Use one puff twice a day 10)  Flonase 50 Mcg/act Susp (Fluticasone propionate) .Marland Kitchen.. 1 to2 puffs per nostril daily as needed for uncontrolled allergies 11)  Trilipix 135  Mg Cpdr (Choline fenofibrate) .... Take 1 tab by mouth at bedtime 12)  Claritin 10 Mg Tabs (Loratadine) .... Take 1 tablet by mouth once a day as needed 13)  Ativan 0.5 Mg Tabs (Lorazepam) .... Half to one tablet at bedtime as needed for sleep 14)  Citalopram Hydrobromide 20 Mg Tabs (Citalopram hydrobromide) .... Take 1 tablet by mouth once a day  Other Orders: Influenza Vaccine MCR (44010) T-Basic Metabolic Panel ([id number]) Tdap => 6yrs IM (34742) Admin 1st Vaccine (59563) Admin 1st Vaccine [facility]) [phone])  Patient Instructions: 1)  CPE mid January. 2)  Tobacco is very bad for your health and your loved ones! You Should stop smoking!. 3)  Stop Smoking Tips: Choose a Quit date. Cut down before the Quit date. decide what you will do as a substitute when you feel the urge to smoke(gum,toothpick,exercise). 4)  pls call dermatology and make appt for evaluation of the lesion on your left cheek this si vital. 5)  You will get inc  dose in the depression med, take TWO of the 10 mg  tabs daily till done, new dose is 20mg  one daily , this will be sent in. 6)  You will need a chest CT scan in [name] to f/u the nodule Prescriptions: CITALOPRAM HYDROBROMIDE 20 MG TABS (CITALOPRAM HYDROBROMIDE) Take 1 tablet by mouth once a day  #30 x 3   Entered and Authorized by:   Syliva Overman MD   Signed by:   Syliva Overman MD on 11/01/2009   Method used:   Printed then faxed to ...       [facility]  [address] [phone]* (retail)       [address]       [place], [place]  [address]       Ph: [phone] or [phone]       Fax: [phone]   RxID:   [id number]    Tetanus/Td Vaccine (to be given today)  Influenza Vaccine (to be given today)

## 2010-03-22 NOTE — Letter (Signed)
Summary: medical release  medical release   Imported By: Lind Guest 05/21/2009 14:21:01  _____________________________________________________________________  External Attachment:    Type:   Image     Comment:   External Document

## 2010-03-22 NOTE — Progress Notes (Signed)
Summary: medicine  Phone Note Call from Patient   Summary of Call: her rx for trilipix was not at [facility]  or did you mean to give her samples Initial call taken by: Lind Guest,  February 26, 2009 10:06 AM  Follow-up for Phone Call        Phone Call Completed, Rx Called In Follow-up by: Worthy Keeler LPN,  February 26, 2009 10:38 AM    Prescriptions: TRILIPIX 135 MG CPDR (CHOLINE FENOFIBRATE) Take 1 tab by mouth at bedtime  #30 x 3   Entered by:   Worthy Keeler LPN   Authorized by:   Syliva Overman MD   Signed by:   Worthy Keeler LPN on 16/11/9602   Method used:   Electronically to        [facility] [phone]* (retail)       [address]       [place], [place]  [address]       Ph: [phone] or [phone]       Fax: [phone]   RxID:   [id number]

## 2010-03-22 NOTE — Progress Notes (Signed)
Summary: sample  Phone Note Call from Patient   Summary of Call: needs to get a sample of advair. tried to fill at [facility] but it was to early. [phone] Initial call taken by: Rudene Anda,  October 28, 2009 8:46 AM    Prescriptions: ADVAIR DISKUS 250-50 MCG/DOSE  MISC (FLUTICASONE-SALMETEROL) Use one puff twice a day  #90days x 0   Entered by:   Everitt Amber LPN   Authorized by:   Syliva Overman MD   Signed by:   Everitt Amber LPN on 45/40/9811   Method used:   Electronically to        [facility] [phone]* (retail)       [address]       [place], [place]  [address]       Ph: [phone] or [phone]       Fax: [phone]   RxID:   [id number]

## 2010-03-22 NOTE — Miscellaneous (Signed)
Summary: LABS BMP,LIPIDS,LIVER,12/01/2008  Clinical Lists Changes  Observations: Added new observation of CALCIUM: 10.1 mg/dL (27/25/3664 40:34) Added new observation of ALBUMIN: 4.5 g/dL (74/25/9563 87:56) Added new observation of PROTEIN, TOT: 6.8 g/dL (43/32/9518 84:16) Added new observation of SGPT (ALT): 10 units/L (12/01/2008 14:38) Added new observation of SGOT (AST): 15 units/L (12/01/2008 14:38) Added new observation of ALK PHOS: 61 units/L (12/01/2008 14:38) Added new observation of BILI DIRECT: 0.1 mg/dL (60/63/0160 10:93) Added new observation of CREATININE: 1.22 mg/dL (23/55/7322 02:54) Added new observation of BUN: 11 mg/dL (27/07/2374 28:31) Added new observation of BG RANDOM: 94 mg/dL (51/76/1607 37:10) Added new observation of CO2 PLSM/SER: 26 meq/L (12/01/2008 14:38) Added new observation of CL SERUM: 104 meq/L (12/01/2008 14:38) Added new observation of K SERUM: 4.8 meq/L (12/01/2008 14:38) Added new observation of NA: 142 meq/L (12/01/2008 14:38) Added new observation of LDL: 151 mg/dL (62/69/4854 62:70) Added new observation of HDL: 77 mg/dL (35/00/9381 82:99) Added new observation of TRIGLYC TOT: 65 mg/dL (37/16/9678 93:81) Added new observation of CHOLESTEROL: 241 mg/dL (01/75/1025 85:27)

## 2010-03-22 NOTE — Medication Information (Signed)
Summary: Tax adviser   Imported By: Lind Guest 04/06/2009 14:11:41  _____________________________________________________________________  External Attachment:    Type:   Image     Comment:   External Document

## 2010-03-22 NOTE — Medication Information (Signed)
Summary: Tax adviser   Imported By: Lind Guest 02/22/2009 14:41:24  _____________________________________________________________________  External Attachment:    Type:   Image     Comment:   External Document

## 2010-03-22 NOTE — Letter (Signed)
Summary: [employer]/ [employer]  [employer]/ [employer]   Imported By: Lind Guest 05/18/2009 13:21:45  _____________________________________________________________________  External Attachment:    Type:   Image     Comment:   External Document

## 2010-03-22 NOTE — Miscellaneous (Signed)
Summary: Orders Update  Clinical Lists Changes  Orders: Added new Test order of T-Helicobacter AB - IgG ([id number]) - Signed 

## 2010-03-22 NOTE — Letter (Signed)
Summary: [facility]   Imported By: Lind Guest 05/05/2009 11:37:41  _____________________________________________________________________  External Attachment:    Type:   Image     Comment:   External Document

## 2010-03-22 NOTE — Assessment & Plan Note (Signed)
Summary: office visit   Vital Signs:  Patient profile:   70 year old female Menstrual status:  hysterectomy Height:      61 inches Weight:      103.75 pounds BMI:     19.67 O2 Sat:      91 % Pulse rate:   106 / minute Resp:     18 per minute BP sitting:   150 / 78  (left arm) Cuff size:   regular  Vitals Entered By: Everitt Amber LPN (September 13, 2009 10:49 AM) CC: left calf pain since the first of last week, no redness but her ankle was swelling also. She has been keeping it elevated    Primary Care Provider:  Dr. Syliva Overman  CC:  left calf pain since the first of last week and no redness but her ankle was swelling also. She has been keeping it elevated .  History of Present Illness: Pt reports she is smoking less, half  cigg, not a whole, she has also been smoking on avg 4 wer week,pack lasts her 3 days Ms. Lagerquist has not been doing very well over thae past several mionths she has lost a significant amt of weight and reports early satiety. She does have an overlay of increaased stress and depression as she cares for a sibling who is mentally ill. she denies any recent fever or chillls, or sputum production.    Preventive Screening-Counseling & Management  Alcohol-Tobacco     Smoking Cessation Counseling: yes  Current Medications (verified): 1)  Calcium/vitamin D/minerals 600-200 Mg-Unit  Tabs (Calcium Carbonate-Vit D-Min) .... Take One Tablet By Mouth Twice A Day 2)  Vitamin E 400 Unit  Caps (Vitamin E) .... Take One Tablet By Mouth Once A Day 3)  Cartia Xt 240 Mg  Cp24 (Diltiazem Hcl Coated Beads) .... Take One Tablet By Mouth Once A Day 4)  Fish Oil 1000 Mg  Caps (Omega-3 Fatty Acids) .... Take Two Caplets Once A Ay 5)  Proventil Hfa 108 (90 Base) Mcg/act Aers (Albuterol Sulfate) .... 2 Puffs Every 4-6 Hours As Needed 6)  Adult Aspirin Ec Low Strength 81 Mg  Tbec (Aspirin) .... Take 1 Tablet By Mouth Once A Day 7)  Oxygen .... 2l At Bedtime 8)  Spiriva Handihaler 18  Mcg  Caps (Tiotropium Bromide Monohydrate) .... Inhale Contents of 1 Capsule Once A Day 9)  Advair Diskus 250-50 Mcg/dose  Misc (Fluticasone-Salmeterol) .... Use One Puff Twice A Day 10)  Flonase 50 Mcg/act Susp (Fluticasone Propionate) .Marland Kitchen.. 1 To2 Puffs Per Nostril Daily As Needed For Uncontrolled Allergies 11)  Trilipix 135 Mg Cpdr (Choline Fenofibrate) .... Take 1 Tab By Mouth At Bedtime 12)  Claritin 10 Mg Tabs (Loratadine) .... Take 1 Tablet By Mouth Once A Day As Needed 13)  Ativan 0.5 Mg Tabs (Lorazepam) .... Half To One Tablet At Bedtime As Needed For Sleep 14)  Tessalon Perles 100 Mg Caps (Benzonatate) .... Take 1 Capsule By Mouth Two Times A Day  Allergies (verified): 1)  ! Pcn 2)  ! Sulfa 3)  ! Wellbutrin 4)  ! * Statins 5)  ! Flexeril 6)  ! Levaquin  Review of Systems      See HPI General:  Complains of fatigue, loss of appetite, malaise, sleep disorder, weakness, and weight loss; denies chills and fever. Eyes:  Complains of vision loss-both eyes; denies discharge, double vision, and red eye. ENT:  Complains of postnasal drainage; denies hoarseness, nasal congestion, sinus pressure, and sore  throat. CV:  Denies chest pain or discomfort, palpitations, shortness of breath with exertion, and swelling of feet. Resp:  Complains of cough, shortness of breath, and wheezing; denies sputum productive; chronic and ongoing nicotine use, unwilling to quit at this time. GI:  Complains of loss of appetite; denies abdominal pain, change in bowel habits, constipation, indigestion, nausea, and vomiting; 15 pound weight loss in 6 mionths. GU:  Denies dysuria, incontinence, and urinary frequency. MS:  Complains of muscle weakness; left calf pain intermittently relieved with elevation. Derm:  Denies itching and rash. Neuro:  Denies headaches, seizures, and sensation of room spinning. Psych:  Complains of anxiety and depression; denies irritability, mental problems, panic attacks, suicidal  thoughts/plans, thoughts of violence, and unusual visions or sounds; increased stress since her sister who has seveere depression is living with her in the past 6 months, relief is ahead as another sibling is coming tpo assist. Endo:  Complains of weight change; denies cold intolerance, excessive thirst, and excessive urination. Heme:  Denies abnormal bruising and bleeding. Allergy:  Complains of seasonal allergies.  Physical Exam  General:  Well-developed,,in no acute distress; alert,appropriate and cooperative throughout examination. Pt dyspneic with activity and a t rest HEENT: No facial asymmetry,  EOMI, No sinus tenderness, TM's Clear, oropharynx  pink and moist.   Chest: decreased air entry, bilateral wheezes, no crackles CVS: systolic  murmur, No S3.   Abd: Soft, Nontender.  MS: Adequate ROM spine, hips, shoulders and knees.  Ext: No edema.   CNS: CN 2-12 intact, power tone and sensation normal throughout.   Skin: Intact, no visible lesions or rashes.  Psych: Good eye contact, normal affect.  Memory intact, not anxious or depressed appearing.    Impression & Recommendations:  Problem # 1:  IMPAIRED FASTING GLUCOSE (ICD-790.21) Assessment Comment Only  Orders: T- Hemoglobin A1C ([id number]) diet in low concentrated sweets and low carbs advised  Problem # 2:  DEPRESSION (ICD-311) Assessment: Deteriorated  Her updated medication list for this problem includes:    Ativan 0.5 Mg Tabs (Lorazepam) ..... Half to one tablet at bedtime as needed for sleep    Citalopram Hydrobromide 10 Mg Tabs (Citalopram hydrobromide) .Marland Kitchen... Take 1 tablet by mouth once a day  Discussed treatment options, including trial of antidpressant medication. Will refer to behavioral health. Follow-up call in in 24-48 hours and recheck in 2 weeks, sooner as needed. Patient agrees to call if any worsening of symptoms or thoughts of doing harm arise. Verified that the patient has no suicidal ideation at this  time.   Problem # 3:  HYPERLIPIDEMIA (ICD-272.4) Assessment: Comment Only  Her updated medication list for this problem includes:    Trilipix 135 Mg Cpdr (Choline fenofibrate) .Marland Kitchen... Take 1 tab by mouth at bedtime  Orders: T-Lipid Profile [id number]) T-Hepatic Function [phone])  Labs Reviewed: SGOT: 18 (07/06/2009)   SGPT: <8 U/L (07/06/2009)   HDL:82 (07/06/2009), 77 (12/01/2008)  LDL:107 (07/06/2009), 151 (12/01/2008)  Chol:200 (07/06/2009), 241 (12/01/2008)  Trig:54 (07/06/2009), 65 (12/01/2008)  Problem # 4:  CHRONIC OBSTRUCTIVE PULMONARY DISEASE (ICD-496) Assessment: Deteriorated  The following medications were removed from the medication list:    Combivent 18-103 Mcg/act Aero (Ipratropium-albuterol) ..... Uad Her updated medication list for this problem includes:    Proventil Hfa 108 (90 Base) Mcg/act Aers (Albuterol sulfate) .Marland Kitchen... 2 puffs every 4-6 hours as needed    Spiriva Handihaler 18 Mcg Caps (Tiotropium bromide monohydrate) ..... Inhale contents of 1 capsule once a day    Advair Diskus  250-50 Mcg/dose Misc (Fluticasone-salmeterol) ..... Use one puff twice a day  Pulmonary Functions Reviewed: FEV1: 0.93 (08/15/2007)   FEV 25-75: 0.33 (08/15/2007)   O2 sat: 91 (09/13/2009)     Vaccines Reviewed: Pneumovax: Pneumovax (Medicare) (12/30/2008)   Flu Vax: Fluvax 3+ (11/12/2006)  Problem # 5:  ESSENTIAL HYPERTENSION, BENIGN (ICD-401.1) Assessment: Deteriorated  The following medications were removed from the medication list:    Cozaar 50 Mg Tabs (Losartan potassium) .Marland Kitchen... Take 1/2  tablet by mouth as needed Her updated medication list for this problem includes:    Cartia Xt 240 Mg Cp24 (Diltiazem hcl coated beads) .Marland Kitchen... Take one tablet by mouth once a day  Orders: T-Basic Metabolic Panel [phone])  BP today: 150/78 Prior BP: 124/70 (07/28/2009)  Labs Reviewed: K+: 4.4 (07/06/2009) Creat: : 0.99 (07/06/2009)   Chol: 200 (07/06/2009)   HDL: 82 (07/06/2009)    LDL: 107 (07/06/2009)   TG: 54 (07/06/2009)  Problem # 6:  INSOMNIA (ICD-780.52) Assessment: Deteriorated  Discussed sleep hygiene. pt under increased stress  Problem # 7:  LOSS OF WEIGHT (ICD-783.21) Assessment: Comment Only  Orders: CXR- 2view (CXR) Gastroenterology Referral (GI)  Complete Medication List: 1)  Calcium/vitamin D/minerals 600-200 Mg-unit Tabs (Calcium carbonate-vit d-min) .... Take one tablet by mouth twice a day 2)  Vitamin E 400 Unit Caps (Vitamin e) .... Take one tablet by mouth once a day 3)  Cartia Xt 240 Mg Cp24 (Diltiazem hcl coated beads) .... Take one tablet by mouth once a day 4)  Fish Oil 1000 Mg Caps (Omega-3 fatty acids) .... Take two caplets once a ay 5)  Proventil Hfa 108 (90 Base) Mcg/act Aers (Albuterol sulfate) .... 2 puffs every 4-6 hours as needed 6)  Adult Aspirin Ec Low Strength 81 Mg Tbec (Aspirin) .... Take 1 tablet by mouth once a day 7)  Oxygen  .... 2l at bedtime 8)  Spiriva Handihaler 18 Mcg Caps (Tiotropium bromide monohydrate) .... Inhale contents of 1 capsule once a day 9)  Advair Diskus 250-50 Mcg/dose Misc (Fluticasone-salmeterol) .... Use one puff twice a day 10)  Flonase 50 Mcg/act Susp (Fluticasone propionate) .Marland Kitchen.. 1 to2 puffs per nostril daily as needed for uncontrolled allergies 11)  Trilipix 135 Mg Cpdr (Choline fenofibrate) .... Take 1 tab by mouth at bedtime 12)  Claritin 10 Mg Tabs (Loratadine) .... Take 1 tablet by mouth once a day as needed 13)  Ativan 0.5 Mg Tabs (Lorazepam) .... Half to one tablet at bedtime as needed for sleep 14)  Tessalon Perles 100 Mg Caps (Benzonatate) .... Take 1 capsule by mouth two times a day 15)  Citalopram Hydrobromide 10 Mg Tabs (Citalopram hydrobromide) .... Take 1 tablet by mouth once a day  Patient Instructions: 1)  f/u in 6 weeks. 2)  Tobacco is very bad for your health and your loved ones! You Should stop smoking!. 3)  Stop Smoking Tips: Choose a Quit date. Cut down before the Quit  date. decide what you will do as a substitute when you feel the urge to smoke(gum,toothpick,exercise). 4)  you will be referred to your gI doc in terms of evaluation for early satiety and 15 pound weight loss since January 5)  you will get cXR and start an antidepressant. 6)  BMP prior to visit, ICD-9: 7)  Hepatic Panel prior to visit, ICD-9: 8)  Lipid Panel prior to visit, ICD-9:  fasting in 6 weeks 9)  HbgA1C prior to visit, ICD-9: Prescriptions: TESSALON PERLES 100 MG CAPS (BENZONATATE) Take 1 capsule  by mouth two times a day  #180 x 0   Entered by:   Everitt Amber LPN   Authorized by:   Syliva Overman MD   Signed by:   Everitt Amber LPN on 16/11/9602   Method used:   Printed then [facility]ed [facility] ...       [facility]  [address] [phone]* (retail)       [address]       [place], [place]  [address]       Ph: [phone] or [phone]       Fax: [phone]   RxID:   [id number] ADVAIR DISKUS 250-50 MCG/DOSE  MISC (FLUTICASONE-SALMETEROL) Use one puff twice a day  #90DAY SUPP x 0   Entered by:   Everitt Amber LPN   Authorized by:   Syliva Overman MD   Signed by:   Everitt Amber LPN on 74/25/9563   Method used:   Printed then faxed to ...       [facility]  [address] [phone]* (retail)       [address]       [place], [place]  [address]       Ph: [phone] or [phone]       Fax: [phone]   RxID:   [id number] ATIVAN 0.5 MG TABS (LORAZEPAM) half to one tablet at bedtime as needed for sleep  #90 x 0   Entered by:   Everitt Amber LPN   Authorized by:   Syliva Overman MD   Signed by:   Everitt Amber LPN on 61/60/7371   Method used:   Printed then faxed to ...       [facility]  [address] [phone]* (retail)       [address]       [place], [place]  [address]       Ph: [phone] or [phone]       Fax: [phone]   RxID:   [id number] CITALOPRAM HYDROBROMIDE 10 MG TABS (CITALOPRAM HYDROBROMIDE) Take 1 tablet by mouth once a day  #30 x 3    Entered and Authorized by:   Syliva Overman MD   Signed by:   Syliva Overman MD on 09/13/2009   Method used:   Electronically to        [facility] [phone]* (retail)       [address]       [place], [place]  [address]       Ph: [phone] or [phone]       Fax: [phone]   RxID:   [id number]

## 2010-03-22 NOTE — Letter (Signed)
Summary: MEDICAL RELEASE  MEDICAL RELEASE   Imported By: Lind Guest 05/14/2009 09:59:01  _____________________________________________________________________  External Attachment:    Type:   Image     Comment:   External Document

## 2010-03-22 NOTE — Letter (Signed)
Summary: CT SCAN  ORDER  CT SCAN  ORDER   Imported By: Ave Filter 10/19/2009 09:46:56  _____________________________________________________________________  External Attachment:    Type:   Image     Comment:   External Document

## 2010-03-22 NOTE — Medication Information (Signed)
Summary: ALB &Budesonide/[facility]  ALB &Budesonide/[facility]   Imported By: Lester Worthington 02/23/2009 09:50:11  _____________________________________________________________________  External Attachment:    Type:   Image     Comment:   External Document

## 2010-03-22 NOTE — Progress Notes (Signed)
Summary: advair  Phone Note Call from Patient   Summary of Call: needs her flonase and advair filled today been waiting a month on her advair Initial call taken by: Lind Guest,  November 25, 2009 1:18 PM    Prescriptions: FLONASE 50 MCG/ACT SUSP (FLUTICASONE PROPIONATE) 1 to2 puffs per nostril daily as needed for uncontrolled allergies  #1 x 3   Entered by:   Everitt Amber LPN   Authorized by:   Syliva Overman MD   Signed by:   Everitt Amber LPN on 33/29/5188   Method used:   Electronically to        [facility] [phone]* (retail)       [address]       [place], [place]  [address]       Ph: [phone] or [phone]       Fax: [phone]   RxID:   [id number] ADVAIR DISKUS 250-50 MCG/DOSE  MISC (FLUTICASONE-SALMETEROL) Use one puff twice a day  #90days x 0   Entered by:   Everitt Amber LPN   Authorized by:   Syliva Overman MD   Signed by:   Everitt Amber LPN on 48/54/6270   Method used:   Electronically to        [facility] [phone]* (retail)       [address]       [place], [place]  [address]       Ph: [phone] or [phone]       Fax: [phone]   RxID:   [id number]

## 2010-03-22 NOTE — Progress Notes (Signed)
Summary: tooth  Phone Note Call from Patient   Summary of Call: pts has impacted tooth that needs to be out. but she said needs doc simpson to say its [place].   [phone] Initial call taken by: Rudene Anda,  December 03, 2009 8:38 AM  Follow-up for Phone Call        [facility] and she has an impacted wisdom tooth on right side and and when he found out  had artificial heart valve and he seemed concerned and wanted to know if he can do the procedure. It would be local anethesia. He is going to call here and speak to you next week regarding this next week    Dr. Gildardo Griffes Follow-up by: Everitt Amber LPN,  December 03, 2009 11:32 AM

## 2010-03-22 NOTE — Assessment & Plan Note (Signed)
Summary: office visit   Vital Signs:  Patient profile:   70 year old female Menstrual status:  hysterectomy Height:      61 inches Weight:      107 pounds BMI:     20.29 O2 Sat:      94 % Pulse rate:   91 / minute Pulse rhythm:   regular Resp:     16 per minute BP sitting:   126 / 70  (left arm) Cuff size:   regular  Vitals Entered By: Everitt Amber LPN (Jul 12, 2009 10:48 AM) CC: Follow up chronic problems, has been very fatigued and never has enough energy to do anything even though her o2 has been good   Primary Care Provider:  Dr. Syliva Overman  CC:  Follow up chronic problems and has been very fatigued and never has enough energy to do anything even though her o2 has been good.  History of Present Illness: C/O alot of dyspnea with activity with her oxygen falling as soon as  she comes off oxygen , she has a sib viositing and wants to resume tyhe pred, needs energy , she is aware of the multiple potential adverse s/r of the drug, and is agreeable to having pulmonary input on her longterm mx. Nicotine use persits, she is back to 7 per day, but anticipates it will reduce since her sister isd physically with her who she oftwen worries over.She is on portable oxygen. She reports yellow [id number] in the past approx 3 weeks since stiopping prednisone  Preventive Screening-Counseling & Management  Alcohol-Tobacco     Smoking Cessation Counseling: yes  Allergies (verified): 1)  ! Pcn 2)  ! Sulfa 3)  ! Wellbutrin 4)  ! * Statins 5)  ! Flexeril 6)  ! Levaquin  Review of Systems      See HPI General:  Complains of fatigue; denies chills and fever. Eyes:  Denies discharge, eye pain, and red eye. ENT:  Denies earache, hoarseness, nasal congestion, postnasal drainage, sinus pressure, and sore throat. CV:  Complains of shortness of breath with exertion; denies chest pain or discomfort, difficulty breathing while lying down, palpitations, and swelling of feet. Resp:  Complains of  cough, shortness of breath, sputum productive, and wheezing; pt extremely symptomatic once she is off prednisone. GI:  Denies abdominal pain, constipation, diarrhea, nausea, and vomiting. GU:  Denies dysuria and urinary frequency. MS:  Complains of joint pain, low back pain, mid back pain, muscle weakness, and stiffness. Derm:  Denies itching, lesion(s), and rash. Neuro:  Denies headaches, seizures, and sensation of room spinning. Psych:  Complains of anxiety and depression; denies mental problems, panic attacks, suicidal thoughts/plans, thoughts of violence, and unusual visions or sounds; expects stress to be less now that her sister is with her . Endo:  Denies cold intolerance, excessive thirst, excessive urination, and polyuria. Heme:  Denies abnormal bruising and bleeding. Allergy:  Complains of seasonal allergies.  Physical Exam  General:  Well-developed,adequately nourished,in no acute distress; alert,appropriate and cooperative throughout examination. Pt dyspneic with activity and a t rest HEENT: No facial asymmetry,  EOMI, No sinus tenderness, TM's Clear, oropharynx  pink and moist.   Chest: decreased air entry, bilateral wheezes, no crackles  CVS: S1, S2, No murmurs, No S3.   Abd: Soft, Nontender.  MS: Adequate ROM spine, hips, shoulders and knees.  Ext: No edema.   CNS: CN 2-12 intact, power tone and sensation normal throughout.   Skin: Intact, no visible lesions or rashes.  Psych: Good eye contact, normal affect.  Memory intact, not anxious or depressed appearing.    Impression & Recommendations:  Problem # 1:  SPECIAL SCREENING FOR OSTEOPOROSIS (ICD-V82.81) Assessment Comment Only  Orders: T-Vitamin D (25-Hydroxy) ([id number])  Problem # 2:  HYPERLIPIDEMIA (ICD-272.4) Assessment: Improved  Her updated medication list for this problem includes:    Trilipix 135 Mg Cpdr (Choline fenofibrate) .Marland Kitchen... Take 1 tab by mouth at bedtime  Labs Reviewed: SGOT: 18 (07/06/2009)    SGPT: <8 U/L (07/06/2009)   HDL:82 (07/06/2009), 77 (12/01/2008)  LDL:107 (07/06/2009), 151 (12/01/2008)  Chol:200 (07/06/2009), 241 (12/01/2008)  Trig:54 (07/06/2009), 65 (12/01/2008)  Problem # 3:  NICOTINE ADDICTION (ICD-305.1) Assessment: Unchanged  Encouraged smoking cessation and discussed different methods for smoking cessation.   Problem # 4:  CHRONIC OBSTRUCTIVE PULMONARY DISEASE (ICD-496) Assessment: Deteriorated  Her updated medication list for this problem includes:    Proventil Hfa 108 (90 Base) Mcg/act Aers (Albuterol sulfate) .Marland Kitchen... 2 puffs every 4-6 hours as needed    Spiriva Handihaler 18 Mcg Caps (Tiotropium bromide monohydrate) ..... Inhale contents of 1 capsule once a day    Advair Diskus 250-50 Mcg/dose Misc (Fluticasone-salmeterol) ..... Use one puff twice a day    Combivent 18-103 Mcg/act Aero (Ipratropium-albuterol) ..... Uad  Orders: Pulmonary Referral (Pulmonary), question about meds an d appropriateness of chronic prednisone  Problem # 5:  DEPRESSION (ICD-311) Assessment: Unchanged  Her updated medication list for this problem includes:    Ativan 0.5 Mg Tabs (Lorazepam) ..... Half to one tablet at bedtime as needed for sleep  Complete Medication List: 1)  Calcium/vitamin D/minerals 600-200 Mg-unit Tabs (Calcium carbonate-vit d-min) .... Take one tablet by mouth twice a day 2)  Vitamin E 400 Unit Caps (Vitamin e) .... Take one tablet by mouth once a day 3)  Cartia Xt 240 Mg Cp24 (Diltiazem hcl coated beads) .... Take one tablet by mouth once a day 4)  Fish Oil 1000 Mg Caps (Omega-3 fatty acids) .... Take two caplets once a ay 5)  Proventil Hfa 108 (90 Base) Mcg/act Aers (Albuterol sulfate) .... 2 puffs every 4-6 hours as needed 6)  Cozaar 50 Mg Tabs (Losartan potassium) .... Take 1/2  tablet by mouth as needed 7)  Adult Aspirin Ec Low Strength 81 Mg Tbec (Aspirin) .... Take 1 tablet by mouth once a day 8)  Oxygen  .... 2l at bedtime 9)  Spiriva Handihaler  18 Mcg Caps (Tiotropium bromide monohydrate) .... Inhale contents of 1 capsule once a day 10)  Advair Diskus 250-50 Mcg/dose Misc (Fluticasone-salmeterol) .... Use one puff twice a day 11)  Flonase 50 Mcg/act Susp (Fluticasone propionate) .Marland Kitchen.. 1 to2 puffs per nostril daily as needed for uncontrolled allergies 12)  Trilipix 135 Mg Cpdr (Choline fenofibrate) .... Take 1 tab by mouth at bedtime 13)  Claritin 10 Mg Tabs (Loratadine) .... Take 1 tablet by mouth once a day as needed 14)  Ativan 0.5 Mg Tabs (Lorazepam) .... Half to one tablet at bedtime as needed for sleep 15)  Combivent 18-103 Mcg/act Aero (Ipratropium-albuterol) .... Uad 16)  Erythromycin Base 500 Mg Tabs (Erythromycin base) .... Take 1 tablet by mouth three times a day 17)  Prednisone (pak) 10 Mg Tabs (Prednisone) .... Use as directed 18)  Prednisone 2.5 Mg Tabs (Prednisone) .... Take 1 tablet by mouth once a day  to satrt05/31/2011 19)  Tessalon Perles 100 Mg Caps (Benzonatate) .... Take 1 capsule by mouth two times a day  Other Orders: T-TSH ([id number]) T-CBC  w/Diff [phone]) T- Hemoglobin A1C ([id number])  Patient Instructions: 1)  Please schedule a follow-up appointment in 2 months. 2)  Tobacco is very bad for your health and your loved ones! You Should stop smoking!. 3)  Stop Smoking Tips: Choose a Quit date. Cut down before the Quit date. decide what you will do as a substitute when you feel the urge to smoke(gum,toothpick,exercise). 4)  TSH prior to visit, ICD-9: 5)  CBC w/ Diff prior to visit, ICD-9:  today 6)  HbgA1C prior to visit, ICD-9: 7)  Vit D 8)  you will be referred to pulmonary Prescriptions: TESSALON PERLES 100 MG CAPS (BENZONATATE) Take 1 capsule by mouth two times a day  #60 x 3   Entered and Authorized by:   Syliva Overman MD   Signed by:   Syliva Overman MD on 07/12/2009   Method used:   Electronically to        [facility] [phone]* (retail)       [address]       [place], [place]  [address]       Ph: [phone] or [phone]       Fax: [phone]   RxID:   [id number] PREDNISONE 2.5 MG TABS (PREDNISONE) Take 1 tablet by mouth once a day  to satrt05/31/2011  #30 x 1   Entered and Authorized by:   Syliva Overman MD   Signed by:   Syliva Overman MD on 07/12/2009   Method used:   Electronically to        [facility] [phone]* (retail)       [address]       [place], [place]  [address]       Ph: [phone] or [phone]       Fax: [phone]   RxID:   [id number] PREDNISONE (PAK) 10 MG TABS (PREDNISONE) Use as directed  #21 x 0   Entered and Authorized by:   Syliva Overman MD   Signed by:   Syliva Overman MD on 07/12/2009   Method used:   [facility] to        [facility] [address] [phone]* (retail)       [address]       [place], [place]  [address]       Ph: [phone] or [phone]       Fax: [phone]   RxID:   [id number] ERYTHROMYCIN BASE 500 MG TABS (ERYTHROMYCIN BASE) Take 1 tablet by mouth three times a day  #30 x 0   Entered and Authorized by:   Syliva Overman MD   Signed by:   Syliva Overman MD on 07/12/2009   Method used:   Electronically to        [facility] [phone]* (retail)       [address]       [place], [place]  [address]       Ph: [phone] or [phone]       Fax: [phone]   RxID:   [id number]

## 2010-03-22 NOTE — Letter (Signed)
Summary: Media Results Engineer, agricultural at [facility]  [address], [place] [address]   Phone: [phone]  Fax: [phone]      May 10, 2009 MRN: [id number]   CLARY BOULAIS [address] [place], [place]  [address]   Dear Ms. Deyoe,  Your test ordered by Selena Batten has been reviewed by your physician (or physician assistant) and was found to be normal or stable. Your physician (or physician assistant) felt no changes were needed at this time.  __X__ Echocardiogram  ____ Cardiac Stress Test  ____ Lab Work  ____ Peripheral vascular study of arms, legs or neck  ____ CT scan or X-ray  ____ Lung or Breathing test  ____ Other: Please continue on current medical treatment.   Thank you.   Nona Dell, MD, F.A.C.C

## 2010-03-22 NOTE — Letter (Signed)
Summary: DRUG INTERACTION MONOGRAPH  DRUG INTERACTION MONOGRAPH   Imported By: Rexene Alberts 11/01/2009 16:30:11  _____________________________________________________________________  External Attachment:    Type:   Image     Comment:   External Document

## 2010-03-22 NOTE — Assessment & Plan Note (Signed)
Summary: ov to review gi signs and check weight/jbb   Visit Type:  Follow-up Visit Primary Care [name]:  [name]  CC:  wt loss.  History of Present Illness: Pt returns today in follow-up from a thorough work-up secondary to 15 lb wt loss from [name] to August of this year as well as early satiety and constipation. Upper endoscopy performed 10/12/09 which showed small hiatal hernia, antral erosions, salmon-colored epithelium of distal esophagus, H.pylori biopsy negative, +Barrett's esophagus. CT abd/pelvis 10/20/09 showed what was thought a possible gastric mass; this was reviewed by Dr. Jena Gauss and radiologist who thought this unlikely and more due to underdistention; GES performed 10/26/09 that was normal. Started on Protonix daily. Presents today with weight stable at 103. She denies nausea, reports no loss of appetite. Eating 4X/day. Denies abdominal pain. isolated incidence of reflux with coke that was ingested immediately after eating. started protonix that day and has had no further incidences. Next endoscopy will be in August 2012. Has BM once daily. No hematochezia or melena.    Current Medications (verified): 1)  Calcium/vitamin D/minerals 600-200 Mg-Unit  Tabs (Calcium Carbonate-Vit D-Min) .... Take One Tablet By Mouth Twice A Day 2)  Vitamin E 400 Unit  Caps (Vitamin E) .... Take One Tablet By Mouth Once A Day 3)  Cartia Xt 240 Mg  Cp24 (Diltiazem Hcl Coated Beads) .... Take One Tablet By Mouth Once A Day 4)  Fish Oil 1000 Mg  Caps (Omega-3 Fatty Acids) .... Take Two Caplets Once A Ay 5)  Proventil Hfa 108 (90 Base) Mcg/act Aers (Albuterol Sulfate) .... 2 Puffs Every 4-6 Hours As Needed 6)  Adult Aspirin Ec Low Strength 81 Mg  Tbec (Aspirin) .... Take 1 Tablet By Mouth Once A Day 7)  Oxygen .... 2l At Bedtime 8)  Spiriva Handihaler 18 Mcg  Caps (Tiotropium Bromide Monohydrate) .... Inhale Contents of 1 Capsule Once A Day 9)  Advair Diskus 250-50 Mcg/dose  Misc (Fluticasone-Salmeterol) ....  Use One Puff Twice A Day 10)  Flonase 50 Mcg/act Susp (Fluticasone Propionate) .Marland Kitchen.. 1 To2 Puffs Per Nostril Daily As Needed For Uncontrolled Allergies 11)  Trilipix 135 Mg Cpdr (Choline Fenofibrate) .... Take 1 Tab By Mouth At Bedtime 12)  Claritin 10 Mg Tabs (Loratadine) .... Take 1 Tablet By Mouth Once A Day As Needed 13)  Ativan 0.5 Mg Tabs (Lorazepam) .... Half To One Tablet At Bedtime As Needed For Sleep 14)  Citalopram Hydrobromide 20 Mg Tabs (Citalopram Hydrobromide) .... Take 1 Tablet By Mouth Once A Day 15)  Amoxicillin 500 Mg Caps (Amoxicillin) .... 4 Tablets By Mouth, One Hour Before The Procedure 16)  Protonix 40 Mg Tbec (Pantoprazole Sodium) .... Take 1 Tablet By Mouth Once A Day  Allergies: 1)  ! Pcn 2)  ! Sulfa 3)  ! Wellbutrin 4)  ! * Statins 5)  ! Flexeril 6)  ! Levaquin 7)  ! * Thimerosol  Past History:  Past Surgical History: Last updated: 09/29/2009 Aortic Valve Replacement, 21 mm porcine, 10/04 Appendectomy Cholecystectomy Lumbar laminectomy Lumpectomy, breast Tonsillectomy Right hemicolectomy, 2005  Past Medical History: History of Rheumatic Fever Renal Insufficiency Aortic Stenosis s/p porcine valve replacement Hyperlipidemia Hypertension C O P D, bronchitis Osteoporosis Sebaceous cyst PUD, 30 years ago EGD, 8/11: small hiatal hernia, antral erosions, salmon-colored epithelium of distal esophagus, + barrett's TCS, 6/10-->Single diminutive polyp in the rectum status post cold biopsy removal. (path benign) Status post right hemicolectomy. Colonic ulcers in the upstream end the colon distal to the  anastomosis as described above. tatus post biopsy. The anastomosis itself and neoterminal ileum appeared normal. F/U TCS 5 yrs recommended. 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 Eyes:  Denies blurring, irritation, and discharge. ENT:   Denies sore throat, hoarseness, and difficulty swallowing. CV:  Denies chest pains, dyspnea on exertion, and peripheral edema. Resp:  Denies dyspnea at rest and wheezing. GI:  Complains of indigestion/heartburn; denies difficulty swallowing, nausea, abdominal pain, and constipation; one isolated incidence. . GU:  Denies urinary burning, nocturnal urination, and urinary frequency. MS:  Denies joint pain / LOM, joint swelling, and joint stiffness. Derm:  Denies rash, itching, and dry skin. Neuro:  Denies weakness, syncope, and frequent headaches. Psych:  Denies depression and anxiety.  Vital Signs:  Patient profile:   70 year old female Menstrual status:  hysterectomy Height:      61 inches Weight:      103 pounds BMI:     19.53 Temp:     98.2 degrees F oral Pulse rate:   96 / minute BP sitting:   130 / 70  (left arm) Cuff size:   regular  Vitals Entered By: Cloria Spring LPN (December 13, 2009 10:58 AM)  Physical Exam  General:  Well developed, well nourished, no acute distress. Head:  Normocephalic and atraumatic. Mouth:  No deformity or lesions, dentition normal. Lungs:  Clear throughout to auscultation. Heart:  Regular rate and rhythm; no murmurs, rubs,  or bruits. Abdomen:  normal bowel sounds, without guarding, without rebound, hepatosplenomegaly, no tenderness, and no masses.   Msk:  Symmetrical with no gross deformities. Normal posture. Extremities:  No clubbing, cyanosis, edema or deformities noted. Neurologic:  Alert and  oriented x4;  grossly normal neurologically.  Impression & Recommendations:  Problem # 1:  LOSS OF WEIGHT ([id number]-956.21) Assessment Improved 70 year old female with history of 15 lb weight loss over approximately 8 mos, presenting this past August. Thorough negative GI work-up completed, weight stable at this point from August at 103. Handout given to pt regarding avenues to increase caloric intake via healthy foods.   Continue current plan of  care. PPI daily Repeat EGD in 1 year (August 2012, hx Barrett's), reminder in computer  Orders: Est. Patient Level II (30865)  Problem # 2:  EARLY SATIETY (ICD-780.94) Assessment: Improved Pt eating 4x daily. Denies N/V. Denies abdominal pain. See #1  Orders: Est. Patient Level II (78469)  Problem # 3:  CONSTIPATION (ICD-564.00) Assessment: Improved Hx of chronic constipation, reports BM daily. Continue current plan of care.   Orders: Est. Patient Level II (62952)   Patient Instructions: 1)  Continue PPI daily 2)  Repeat EGD in 1 year, history of Barrett's 3)  Contact office if abdominal pain, N/V, weight loss

## 2010-03-22 NOTE — Assessment & Plan Note (Signed)
Summary: TO RE-ESTABLISH/PREVIOUS DR.ROSS PT/TG  Medications Added COZAAR 50 MG  TABS (LOSARTAN POTASSIUM) Take 1/2  tablet by mouth as needed      Allergies Added:   Visit Type:  Follow-up Referring Provider:  Dr. Levy Pupa Primary Provider:  Dr. Syliva Overman   History of Present Illness: 70 year old woman presents for a followup visit. She was previously followed by Dr. Tenny Craw, last seen in July 2009. Cardiac history is detailed below. Her main functional limitations are related to significant COPD with ongoing tobacco use. She has a fairly chronically elevated heart rate at rest, worse with activity, despite use of Cardizem. Her last echocardiogram was in 2009 as detailed below, done in followup of her aortic valve replacement.  Ms. Borski denies any exertional chest pain. She has a chronic intermittent cough, no recent wheezing. She was seen in the emergency department back in December with a flare of COPD, treated with steroids.  Ms. Lina is in the process of cutting back her cigarette use. She reports using some type of [place] remedy [place].  She was on Prozac for a period of time but had to stop this related to tremors and labile emotions.  Current Medications (verified): 1)  Calcium/vitamin D/minerals 600-200 Mg-Unit  Tabs (Calcium Carbonate-Vit D-Min) .... Take One Tablet By Mouth Twice A Day 2)  Vitamin E 400 Unit  Caps (Vitamin E) .... Take One Tablet By Mouth Once A Day 3)  Cartia Xt 240 Mg  Cp24 (Diltiazem Hcl Coated Beads) .... Take One Tablet By Mouth Once A Day 4)  Fish Oil 1000 Mg  Caps (Omega-3 Fatty Acids) .... Take Two Caplets Once A Ay 5)  Proventil Hfa 108 (90 Base) Mcg/act Aers (Albuterol Sulfate) .... 2 Puffs Every 4-6 Hours As Needed 6)  Cozaar 50 Mg  Tabs (Losartan Potassium) .... Take 1/2  Tablet By Mouth As Needed 7)  Adult Aspirin Ec Low Strength 81 Mg  Tbec (Aspirin) .... Take 1 Tablet By Mouth Once A Day 8)  Oxygen .... 2l At  Bedtime 9)  Spiriva Handihaler 18 Mcg  Caps (Tiotropium Bromide Monohydrate) .... Inhale Contents of 1 Capsule Once A Day 10)  Advair Diskus 250-50 Mcg/dose  Misc (Fluticasone-Salmeterol) .... Use One Puff Twice A Day 11)  Flonase 50 Mcg/act Susp (Fluticasone Propionate) .Marland Kitchen.. 1 To2 Puffs Per Nostril Daily As Needed For Uncontrolled Allergies 12)  Trilipix 135 Mg Cpdr (Choline Fenofibrate) .... Take 1 Tab By Mouth At Bedtime 13)  Claritin 10 Mg Tabs (Loratadine) .... Take 1 Tablet By Mouth Once A Day As Needed 14)  Ativan 0.5 Mg Tabs (Lorazepam) .... Half To One Tablet At Bedtime As Needed For Sleep  Allergies (verified): 1)  ! Pcn 2)  ! Sulfa 3)  ! Wellbutrin 4)  ! * Statins 5)  ! Flexeril 6)  ! Levaquin  Past History:  Past Medical History: Last updated: 04/27/2009 Sebaceous cyst History of Rheumatic Fever Renal Insufficiency Aortic Stenosis Hyperlipidemia Hypertension C O P D, bronchitis Osteoporosis Sebaceous cyst Colon cancer  Past Surgical History: Last updated: 04/27/2009 Aortic Valve Replacement, 21 mm porcine, 10/04 Appendectomy Cholecystectomy Colectomy Lumbar laminectomy Lumpectomy Tonsillectomy  Social History: Last updated: 04/27/2009 Divorced Retired-RN Current Smoker-1/2 ppd x 50 years Alcohol use-no Drug use-no  Clinical Review Panels:  Echocardiogram Echocardiogram  SUMMARY   -  Overall left ventricular systolic function was normal. Left         ventricular ejection fraction was estimated to be 65 %. There  were no left ventricular regional wall motion abnormalities.   -  The mean transaortic valve gradient was 18 mmHg. Estimated aortic         valve area (by VTI) was 0.9 cm^2.   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   -  There was mild to moderate mitral annular calcification.   -  Left atrial size was at the upper limits of normal.   -  The estimated peak right ventricular systolic pressure was within          the upper limits of normal.    COMPARISONS   -  Compared to the previous study of 20-Nov-2005 :no significant         interval change. (09/02/2007)    Review of Systems       The patient complains of weight loss, dyspnea on exertion, and peripheral edema.  The patient denies anorexia, fever, chest pain, syncope, prolonged cough, headaches, hemoptysis, melena, hematochezia, and severe indigestion/heartburn.         Also describes early satiety, having to eat small meals. Otherwise reviewed and negative except as outlined above.  Vital Signs:  Patient profile:   70 year old female Menstrual status:  hysterectomy Height:      61 inches Weight:      107 pounds O2 Sat:      92 % BP sitting:   127 / 72  (left arm)  Vitals Entered By: Larita Fife Via LPN (April 29, 2009 8:52 AM)  Physical Exam  Additional Exam:  Thin woman in no acute distress. HEENT: Conjunctiva and lids normal, oropharynx clear with moist mucosa. Neck: Supple, no elevated jugular venous pressure, probable referred cardiac murmur to carotids. No thyromegaly. Lungs: Diminished breath sounds throughout, prolonged expiratory phase, no wheezing or labored breathing. Cardiac: Regular rate and rhythm, 2/6 systolic murmur at the base radiating towards the carotids, no S3 gallop is evident but PMI indistinct. Abdomen: Soft, nontender, normal bowel sounds. Skin: Warm and dry. Extremities: No pitting edema, distal pulses one plus. Musculoskeletal: No gross deformities. Neuropsychiatric: Alert and oriented x3, affect appropriate.   CXR  Procedure date:  02/13/2009  Findings:       CHEST - 2 VIEW    Comparison: 06/10/2008    Findings: Cardiomediastinal silhouette is stable.  Again noted   status post median sternotomy.  No acute infiltrate or pleural   effusion.  Mild hyperinflation again noted.  No pulmonary edema.   Bony thorax is stable    IMPRESSION:   .  Stable COPD.  No active disease.  EKG  Procedure date:   04/29/2009  Findings:      Sinus tachycardia at 113 beats per minute with nonspecific ST changes.  Impression & Recommendations:  Problem # 1:  AORTIC VALVE DISEASE (ICD-424.1)  History of aortic valve stenosis status post aortic valve replacement in October 2004 with a 21 mm porcine prosthesis. Last echocardiogram in 2009 revealed an LVEF of 65% with mean transaortic gradient of 18 mm of mercury. From a symptom perspective Ms. Benninger has chronic shortness of breath with intermittent lower extremity edema, most likely related to her pulmonary status. She is due for a followup echocardiogram to reassess aortic valve however. This will be arranged. If stable, plan to continue an annual followup.  Her updated medication list for this problem includes:    Cozaar 50 Mg Tabs (Losartan potassium) .Marland Kitchen... Take 1/2  tablet by mouth as needed  Orders: 2-D Echocardiogram (2D  Echo)  Problem # 2:  ESSENTIAL HYPERTENSION, BENIGN (ICD-401.1)  Blood pressure reasonably well controlled.  Her updated medication list for this problem includes:    Cartia Xt 240 Mg Cp24 (Diltiazem hcl coated beads) .Marland Kitchen... Take one tablet by mouth once a day    Cozaar 50 Mg Tabs (Losartan potassium) .Marland Kitchen... Take 1/2  tablet by mouth as needed    Adult Aspirin Ec Low Strength 81 Mg Tbec (Aspirin) .Marland Kitchen... 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 Delton Coombes, and due for a visit later this month.  Her updated medication list for this problem includes:    Proventil Hfa 108 (90 Base) Mcg/act Aers (Albuterol sulfate) .Marland Kitchen... 2 puffs every 4-6 hours as needed    Spiriva Handihaler 18 Mcg Caps (Tiotropium bromide monohydrate) ..... Inhale contents of 1 capsule once a day    Advair Diskus 250-50 Mcg/dose Misc (Fluticasone-salmeterol) ..... Use one puff twice a day  Problem # 4:  NICOTINE ADDICTION (ICD-305.1)  We discussed smoking cessation today. She has been working on  reducing her cigarette use, hopefully with the plan for total cessation down the road.  Patient Instructions: 1)  Your physician recommends that you schedule a follow-up appointment in: 1 year 2)  Your physician recommends that you continue on your current medications as directed. Please refer to the Current Medication list given to you today. 3)  Your physician has requested that you have an echocardiogram.  Echocardiography is a painless test that uses sound waves to create images of your heart. It provides your doctor with information about the size and shape of your heart and how well your heart's chambers and valves are working.  This procedure takes approximately one hour. There are no restrictions for this procedure.

## 2010-03-22 NOTE — Progress Notes (Signed)
Summary: please advise  Phone Note Call from Patient   Summary of Call: she has an appt. with Benard Rink on the 24th or 26th of August, she states that since they are suppose to be looking for cancer she wanted to know if you could get a sooner appointment.  Please advise you may leave msg on machine. Initial call taken by: Curtis Sites,  September 21, 2009 11:08 AM  Follow-up for Phone Call        pls let her know I sent in a request for a sooner appt Follow-up by: Syliva Overman MD,  September 22, 2009 3:57 PM  Additional Follow-up for Phone Call Additional follow up Details #1::        patient aware Additional Follow-up by: Curtis Sites,  September 23, 2009 10:47 AM

## 2010-03-22 NOTE — Letter (Signed)
Summary: Patient Notice, Endo Biopsy Results  [facility]  [address]   [place], [place] [address]   Phone: [phone]  Fax: [phone]       October 15, 2009   [name] [address] [place], [place]  [address] [birth year]    Dear Ms. Lupercio,  I am pleased to inform you that the biopsies taken during your recent endoscopic examination did not show any evidence of cancer upon pathologic examination.  There was mild inflammation in your stomach. Biopsies of your esophagus revealed a condition Barrett's esophagus - found in patients with long-standing acid reflux disease.  Additional information/recommendations:  Continue with the treatment plan as outlined on the day of your exam.  You should have a repeat endoscopic examination in 1 year.   Please call us if you are having persistent problems or have questions about your condition that have not been fully answered at this time.  Sincerely,    R. Roetta Sessions MD, FACP [facility] Ph: [phone]   Fax: [phone]   Appended Document: Patient Notice, Endo Biopsy Results Letter mailed to pt.  Appended Document: Patient Notice, Endo Biopsy Results reminder in computer

## 2010-03-22 NOTE — Progress Notes (Signed)
Summary: lab  Phone Note Call from Patient   Summary of Call: pt needs a creatine drawn for ct of chest on 09/17/2009 9:45.  Initial call taken by: Rudene Anda,  September 13, 2009 2:49 PM  Follow-up for Phone Call        order sent and patient aware Follow-up by: Adella Hare LPN,  September 13, 2009 4:45 PM

## 2010-03-22 NOTE — Letter (Signed)
Summary: GES ORDER  GES ORDER   Imported By: Ave Filter 10/22/2009 10:42:32  _____________________________________________________________________  External Attachment:    Type:   Image     Comment:   External Document

## 2010-03-24 NOTE — Letter (Signed)
Summary: dose reduction  dose reduction   Imported By: Lind Guest 02/22/2010 14:01:47  _____________________________________________________________________  External Attachment:    Type:   Image     Comment:   External Document

## 2010-03-24 NOTE — Letter (Signed)
Summary: Recall Office Visit  [facility]  [address]   [place], [place] [address]   Phone: [phone]  Fax: [phone]      March 02, 2010   [name] [address] [place], [place]  [address] [birth year]   Dear Ms. Callari,   According to our records, it is time for you to schedule a follow-up office visit with [place].   At your convenience, please call [phone] to schedule an office visit. If you have any questions, concerns, or feel that this letter is in error, we would appreciate your call.   Sincerely,    Diana Eves  [facility] Ph: [phone]   Fax: [phone]

## 2010-03-24 NOTE — Assessment & Plan Note (Signed)
Summary: ARM   Vital Signs:  Patient profile:   70 year old female Menstrual status:  hysterectomy Height:      61 inches Weight:      102 pounds BMI:     19.34 O2 Sat:      95 % on Room air Pulse rate:   88 / minute Pulse rhythm:   regular BP sitting:   136 / 80  (left arm)  Vitals Entered By: Adella Hare LPN (February 18, 2010 10:08 AM)  O2 Flow:  Room air CC: right arm pain Is Patient Diabetic? No Comments did not bring meds to ov   Primary Care Provider:  [name]  CC:  right arm pain.  History of Present Illness: 2 week h/o right arm pain  above elbow. Remote h/o right elbow trauma right knee trauma and lip trauma,following a fall 2 months before. She had bruised the knee and this has healed .  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 she has point tenderness on the elbow.  Reports  that she has otherwise been doing well. Denies recent fever or chills. Denies sinus pressure, nasal congestion , ear pain or sore throat. Denies chest congestion, or cough productive of sputum. Denies chest pain, palpitations, PND, orthopnea or leg swelling. Denies abdominal pain, nausea, vomitting, diarrhea or constipation. Denies change in bowel movements or bloody stool. Denies dysuria , frequency, incontinence or hesitancy.  Denies headaches, vertigo, seizures. Denies depression, anxiety or insomnia. Denies  rash, lesions, or itch.     Allergies (verified): 1)  ! Pcn 2)  ! Sulfa 3)  ! Wellbutrin 4)  ! * Statins 5)  ! Flexeril 6)  ! Levaquin 7)  ! * Thimerosol  Review of Systems      See HPI General:  Complains of fatigue and malaise. Eyes:  Denies blurring and discharge. Resp:  Complains of cough and shortness of breath; denies wheezing; COPD, still smoking , and is oxygen dependent. MS:  Complains of joint pain; right elbow pain with reduced mobility and point tenderness. Psych:  Denies anxiety and  depression. Endo:  Denies cold intolerance, excessive hunger, excessive thirst, excessive urination, and heat intolerance. Heme:  Denies abnormal bruising and bleeding. Allergy:  Denies hives or rash and itching eyes.  Physical Exam  General:  Well-developed,well-nourished,in no acute distress; alert,appropriate and cooperative throughout examination HEENT: No facial asymmetry,  EOMI, No sinus tenderness, TM's Clear, oropharynx  pink and moist.   Chest: Clear to auscultation bilaterally. decreased air entry throughout CVS: S1, S2, No murmurs, No S3.   Abd: Soft, Nontender.  MS: Adequate ROM spine, hips, shoulders and knees. Decreased rOM right elbow, tender on lateral aspect, mid right arm tender to palpation Ext: No edema.   CNS: CN 2-12 intact, power tone and sensation normal throughout.   Skin: Intact, no visible lesions or rashes.  Psych: Good eye contact, normal affect.  Memory intact, not anxious or depressed appearing.    Impression & Recommendations:  Problem # 1:  LATERAL EPICONDYLITIS, RIGHT (ICD-726.32) Assessment Comment Only  Orders: Medicare Electronic Prescription [phone]) Depo- Medrol 80mg  (J1040) Admin of Therapeutic Inj  intramuscular or subcutaneous (98119)  Problem # 2:  DEPRESSION (ICD-311) Assessment: Improved  The following medications were removed from the medication list:    Citalopram Hydrobromide 20 Mg Tabs (Citalopram hydrobromide) .Marland Kitchen... Take 1 tablet by mouth once a day Her updated medication list for this problem  includes:    Ativan 0.5 Mg Tabs (Lorazepam) ..... Half to one tablet at bedtime as needed for sleep    Citalopram Hydrobromide 10 Mg Tabs (Citalopram hydrobromide) .Marland Kitchen... Take 1 tablet by mouth once a day  Problem # 3:  ESSENTIAL HYPERTENSION, BENIGN (ICD-401.1) Assessment: Deteriorated  Her updated medication list for this problem includes:    Cartia Xt 240 Mg Cp24 (Diltiazem hcl coated beads) .Marland Kitchen... Take one tablet by mouth once a  day  BP today: 136/80 Prior BP: 126/76 (02/16/2010)  Labs Reviewed: K+: 4.0 (10/23/2009) Creat: : 1.10 (10/23/2009)   Chol: 200 (10/23/2009)   HDL: 81 (10/23/2009)   LDL: 105 (10/23/2009)   TG: 69 (10/23/2009)  Problem # 4:  CHRONIC OBSTRUCTIVE PULMONARY DISEASE (ICD-496) Assessment: Comment Only  Her updated medication list for this problem includes:    Proventil Hfa 108 (90 Base) Mcg/act Aers (Albuterol sulfate) .Marland Kitchen... 2 puffs every 4-6 hours as needed    Spiriva Handihaler 18 Mcg Caps (Tiotropium bromide monohydrate) ..... Inhale contents of 1 capsule once a day    Advair Diskus 250-50 Mcg/dose Misc (Fluticasone-salmeterol) ..... Use one puff twice a day pt reportsthis has improved, was recently evaluated by pulmonary  Complete Medication List: 1)  Calcium/vitamin D/minerals 600-200 Mg-unit Tabs (Calcium carbonate-vit d-min) .... Take one tablet by mouth twice a day 2)  Vitamin E 400 Unit Caps (Vitamin e) .... Take one tablet by mouth once a day 3)  Cartia Xt 240 Mg Cp24 (Diltiazem hcl coated beads) .... Take one tablet by mouth once a day 4)  Fish Oil 1000 Mg Caps (Omega-3 fatty acids) .... Take two caplets once a ay 5)  Proventil Hfa 108 (90 Base) Mcg/act Aers (Albuterol sulfate) .... 2 puffs every 4-6 hours as needed 6)  Adult Aspirin Ec Low Strength 81 Mg Tbec (Aspirin) .... Take 1 tablet by mouth once a day 7)  Oxygen  .... 2l at bedtime 8)  Spiriva Handihaler 18 Mcg Caps (Tiotropium bromide monohydrate) .... Inhale contents of 1 capsule once a day 9)  Advair Diskus 250-50 Mcg/dose Misc (Fluticasone-salmeterol) .... Use one puff twice a day 10)  Flonase 50 Mcg/act Susp (Fluticasone propionate) .Marland Kitchen.. 1 to2 puffs per nostril daily as needed for uncontrolled allergies 11)  Trilipix 135 Mg Cpdr (Choline fenofibrate) .... Take 1 tab by mouth at bedtime 12)  Claritin 10 Mg Tabs (Loratadine) .... Take 1 tablet by mouth once a day as needed 13)  Ativan 0.5 Mg Tabs (Lorazepam) .... Half  to one tablet at bedtime as needed for sleep 14)  Protonix 40 Mg Tbec (Pantoprazole sodium) .... Take 1 tablet by mouth once a day 15)  Tessalon Perles 100 Mg Caps (Benzonatate) .... One cap by mouth three times a day 16)  Prednisone (pak) 5 Mg Tabs (Prednisone) .... Use as directed 17)  Citalopram Hydrobromide 10 Mg Tabs (Citalopram hydrobromide) .... Take 1 tablet by mouth once a day  Patient Instructions: 1)  F/U as before. 2)  You are being treated for "tennis elbow " due to overuse. 3)  You are really not able to care for so many  animals . 4)   you wil get an injection in the office today and meds are sent to the pharmacy. Prescriptions: CITALOPRAM HYDROBROMIDE 10 MG TABS (CITALOPRAM HYDROBROMIDE) Take 1 tablet by mouth once a day  #30 x 3   Entered and Authorized by:   Syliva Overman MD   Signed by:   Syliva Overman MD on 02/18/2010  Method used:   Printed then faxed to ...       [facility]  [address] [phone]* (retail)       [address]       [place], [place]  [address]       Ph: [phone] or [phone]       Fax: [phone]   RxID:   [id number] PREDNISONE (PAK) 5 MG TABS (PREDNISONE) Use as directed  #21 x 0   Entered and Authorized by:   Syliva Overman MD   Signed by:   Syliva Overman MD on 02/18/2010   Method used:   Electronically to        [facility] [phone]* (retail)       [address]       [place], [place]  [address]       Ph: [phone] or [phone]       Fax: [phone]   RxID:   [id number]    Medication Administration  Injection # 1:    Medication: Depo- Medrol 80mg     Diagnosis: LATERAL EPICONDYLITIS, RIGHT (ICD-726.32)    Route: IM    Site: RUOQ gluteus    Exp Date: 06/12    Lot #: [id number]    Mfr: [employer]    Patient tolerated injection without complications    Given by: Adella Hare LPN (February 18, 2010 10:54 AM)  Orders Added: 1)  Est. Patient Level IV [42706] 2)  Medicare Electronic  Prescription [G8553] 3)  Depo- Medrol 80mg  [J1040] 4)  Admin of Therapeutic Inj  intramuscular or subcutaneous [96372]     Medication Administration  Injection # 1:    Medication: Depo- Medrol 80mg     Diagnosis: LATERAL EPICONDYLITIS, RIGHT (ICD-726.32)    Route: IM    Site: RUOQ gluteus    Exp Date: 06/12    Lot #: [id number]    Mfr: [employer]    Patient tolerated injection without complications    Given by: Adella Hare LPN (February 18, 2010 10:54 AM)  Orders Added: 1)  Est. Patient Level IV [23762] 2)  Medicare Electronic Prescription [G8553] 3)  Depo- Medrol 80mg  [J1040] 4)  Admin of Therapeutic Inj  intramuscular or subcutaneous [83151]

## 2010-03-24 NOTE — Assessment & Plan Note (Signed)
Summary: COPD, pulmonary nodule   Visit Type:  Follow-up Copy to:  Syliva Overman Primary Provider/Referring Provider:  [name]  CC:  COPD...no breathing complaints today...still smoking up to 1/2 pack per day.  History of Present Illness: 70 yo woman with tobacco use, dx COPD in late 90's based on PFT's at the time in [place], tissue AVR, HTN.   ROV 08/28/08 -- regular follow up visit. Having mucous production every day, able to mobilze, some yellow color but no blood . Activity level has been good, able to exert. Ususally doesn't have to stop to rest because of breathing. Able to lift boxes, empty trash, etc. Still smoking about 1/2 a pack a day.   ROV 12/30/08 -- Regular f/u visit today. Tells me that she was recently treated by Dr [name] for an exacerbation (10/14) with Pred. Was experiencing more cough, mucous plugs at the time. Nothing really changed - still with more DOE, D with bending, cough, coughs up some plugs. Taking Spiriva + Advair, using combivent as needed (about 2 x a day). She is still smoking 1/2 pk a day, but planning to cut down, not sure she is ready to set a quit date.   ROV 07/28/09 -- F/u for COPD. Has decreased cigarettes to 0.25 pk/day. She is staying busy, able to exert without SOB limiting her. On Spiriva + Advair, uses Proventil as needed, needs it ocass but not every day. On prednisone 2.5mg  once daily, being tapered down to zero. She has been treated for exacerbation x 1 since last visit, was in May '11.   ROV 02/16/10 -- COPD, tobacco use, FEV1 0.97 L. Regular f/u visit. Using Spiriva and Advair. She has O2 available to use with exertion, qualified by Dr [name]. Smoking 1/2 pk a day. Feels that her COPD is doing "the best it has ever been" but admits to some DOE, also notes some desats with chores and walking. She has the O2 to use w exertion but does not use it. No exacerbations since last time.   Preventive Screening-Counseling &  Management  Alcohol-Tobacco     Smoking Status: current     Packs/Day: 0.5  Current Medications (verified): 1)  Calcium/vitamin D/minerals 600-200 Mg-Unit  Tabs (Calcium Carbonate-Vit D-Min) .... Take One Tablet By Mouth Twice A Day 2)  Vitamin E 400 Unit  Caps (Vitamin E) .... Take One Tablet By Mouth Once A Day 3)  Cartia Xt 240 Mg  Cp24 (Diltiazem Hcl Coated Beads) .... Take One Tablet By Mouth Once A Day 4)  Fish Oil 1000 Mg  Caps (Omega-3 Fatty Acids) .... Take Two Caplets Once A Ay 5)  Proventil Hfa 108 (90 Base) Mcg/act Aers (Albuterol Sulfate) .... 2 Puffs Every 4-6 Hours As Needed 6)  Adult Aspirin Ec Low Strength 81 Mg  Tbec (Aspirin) .... Take 1 Tablet By Mouth Once A Day 7)  Oxygen .... 2l At Bedtime 8)  Spiriva Handihaler 18 Mcg  Caps (Tiotropium Bromide Monohydrate) .... Inhale Contents of 1 Capsule Once A Day 9)  Advair Diskus 250-50 Mcg/dose  Misc (Fluticasone-Salmeterol) .... Use One Puff Twice A Day 10)  Flonase 50 Mcg/act Susp (Fluticasone Propionate) .Marland Kitchen.. 1 To2 Puffs Per Nostril Daily As Needed For Uncontrolled Allergies 11)  Trilipix 135 Mg Cpdr (Choline Fenofibrate) .... Take 1 Tab By Mouth At Bedtime 12)  Claritin 10 Mg Tabs (Loratadine) .... Take 1 Tablet By Mouth Once A Day As Needed 13)  Ativan 0.5 Mg Tabs (Lorazepam) .... Half To One Tablet  At Bedtime As Needed For Sleep 14)  Citalopram Hydrobromide 20 Mg Tabs (Citalopram Hydrobromide) .... Take 1 Tablet By Mouth Once A Day 15)  Protonix 40 Mg Tbec (Pantoprazole Sodium) .... Take 1 Tablet By Mouth Once A Day 16)  Tessalon Perles 100 Mg Caps (Benzonatate) .... One Cap By Mouth Three Times A Day  Allergies (verified): 1)  ! Pcn 2)  ! Sulfa 3)  ! Wellbutrin 4)  ! * Statins 5)  ! Flexeril 6)  ! Levaquin 7)  ! * Thimerosol  Social History: Packs/Day:  0.5  Vital Signs:  Patient profile:   70 year old female Menstrual status:  hysterectomy Height:      61 inches (154.94 cm) Weight:      101.38 pounds  (46.08 kg) BMI:     19.22 O2 Sat:      91 % on Room air Temp:     98.5 degrees F (36.94 degrees C) oral Pulse rate:   100 / minute BP sitting:   126 / 76  (left arm) Cuff size:   regular  Vitals Entered By: Michel Bickers CMA (February 16, 2010 2:03 PM)  O2 Sat at Rest %:  91 O2 Flow:  Room air CC: COPD...no breathing complaints today...still smoking up to 1/2 pack per day Comments Medications reviewed with patient Michel Bickers CMA  February 16, 2010 2:09 PM   Physical Exam  General:  well developed, well nourished, in no acute distress Eyes:  conjunctiva and sclera clear Nose:  no external erythema and no nasal discharge.   Mouth:  no deformity or lesions Neck:  No deformities, masses, or tenderness noted. Lungs:  bilateral low-pitched exp wheezes. Scattered rhonchi Heart:  Normal rate and regular rhythm. 2/6 syst M Abdomen:  not examined Msk:  Decreased ROM thoracolumbar spine Extremities:  no clubbing, cyanosis, edema, or deformity noted Neurologic:  non-focal Skin:  intact without lesions or rashes Cervical Nodes:  no significant adenopathy Psych:  alert and cooperative; normal mood and affect; normal attention span and concentration   Impression & Recommendations:  Problem # 1:  CHRONIC OBSTRUCTIVE PULMONARY DISEASE (ICD-496) - same BD's - discussed smoking cessation - discussed O2 compliance  Problem # 2:  PULMONARY NODULE (ICD-518.89)  Planning for f/u CT scan with Dr [name] in 02/2009.  - will ask that copies be sent to me.   Orders: Est. Patient Level IV (60454)  Patient Instructions: 1)  Please continue your same inhaled medications.  2)  Wear your O2 more reliably with exertion.  3)  You need to stop smoking, continue to try to cut down in preparation to quit.  4)  Follow up with Dr Delton Coombes in 6 months or as needed

## 2010-03-24 NOTE — Letter (Signed)
Summary: [facility]  [facility]   Imported By: Sherian Rein 02/23/2010 11:26:02  _____________________________________________________________________  External Attachment:    Type:   Image     Comment:   External Document

## 2010-03-24 NOTE — Assessment & Plan Note (Signed)
Summary: office visit   Vital Signs:  Patient profile:   70 year old female Menstrual status:  hysterectomy Height:      61 inches Weight:      104.50 pounds BMI:     19.82 O2 Sat:      92 % on Room air Pulse rate:   92 / minute Resp:     16 per minute BP sitting:   124 / 80  (left arm) Cuff size:   regular  Vitals Entered By: Everitt Amber LPN (March 03, 2010 10:52 AM)  O2 Flow:  Room air CC: CPE   Vision Screening:Left eye w/o correction: 20 / 70 Right Eye w/o correction: 20 / 25 Both eyes w/o correction:  20/ 25  Color vision testing: normal      Vision Entered By: Everitt Amber LPN (March 03, 2010 11:00 AM)   Primary Care Provider:  [name]  CC:  CPE .  History of Present Illness: Reports  thatshe is doing fairly well. Denies recent fever or chills. Denies sinus pressure, nasal congestion , ear pain or sore throat. Chronic chest congestion, and cough productive of sputum.increased exercise intolerance, still smoking, but less Denies chest pain, palpitations, PND, orthopnea or leg swelling. Denies abdominal pain, nausea, vomitting, diarrhea or constipation. Denies change in bowel movements or bloody stool. Denies dysuria , frequency, incontinence or hesitancy.  Denies headaches, vertigo, seizures. Denies uncontrolled depression, anxiety or insomnia. Denies  rash, lesions, or itch.     Preventive Screening-Counseling & Management  Alcohol-Tobacco     Smoking Cessation Counseling: yes  Current Medications (verified): 1)  Calcium/vitamin D/minerals 600-200 Mg-Unit  Tabs (Calcium Carbonate-Vit D-Min) .... Take One Tablet By Mouth Twice A Day 2)  Vitamin E 400 Unit  Caps (Vitamin E) .... Take One Tablet By Mouth Once A Day 3)  Cartia Xt 240 Mg  Cp24 (Diltiazem Hcl Coated Beads) .... Take One Tablet By Mouth Once A Day 4)  Fish Oil 1000 Mg  Caps (Omega-3 Fatty Acids) .... Take Two Caplets Once A Ay 5)  Proventil Hfa 108 (90 Base) Mcg/act Aers (Albuterol  Sulfate) .... 2 Puffs Every 4-6 Hours As Needed 6)  Adult Aspirin Ec Low Strength 81 Mg  Tbec (Aspirin) .... Take 1 Tablet By Mouth Once A Day 7)  Oxygen .... 2l At Bedtime 8)  Spiriva Handihaler 18 Mcg  Caps (Tiotropium Bromide Monohydrate) .... Inhale Contents of 1 Capsule Once A Day 9)  Advair Diskus 250-50 Mcg/dose  Misc (Fluticasone-Salmeterol) .... Use One Puff Twice A Day 10)  Flonase 50 Mcg/act Susp (Fluticasone Propionate) .Marland Kitchen.. 1 To2 Puffs Per Nostril Daily As Needed For Uncontrolled Allergies 11)  Trilipix 135 Mg Cpdr (Choline Fenofibrate) .... Take 1 Tab By Mouth At Bedtime 12)  Claritin 10 Mg Tabs (Loratadine) .... Take 1 Tablet By Mouth Once A Day As Needed 13)  Ativan 0.5 Mg Tabs (Lorazepam) .... Half To One Tablet At Bedtime As Needed For Sleep 14)  Protonix 40 Mg Tbec (Pantoprazole Sodium) .... Take 1 Tablet By Mouth Once A Day 15)  Tessalon Perles 100 Mg Caps (Benzonatate) .... One Cap By Mouth Three Times A Day 16)  Citalopram Hydrobromide 10 Mg Tabs (Citalopram Hydrobromide) .... Take 1 Tablet By Mouth Once A Day 17)  Co-Enzyme Q-10 30 Mg Caps (Coenzyme Q10) .... Take 1 Tablet By Mouth Once A Day  Allergies (verified): 1)  ! Pcn 2)  ! Sulfa 3)  ! Wellbutrin 4)  ! * Statins 5)  !  Flexeril 6)  ! Levaquin 7)  ! * Thimerosol  Review of Systems      See HPI General:  Complains of fatigue. Eyes:  Denies discharge and red eye. Resp:  Complains of cough, shortness of breath, sputum productive, and wheezing. MS:  Complains of joint pain, low back pain, and mid back pain. Endo:  Denies cold intolerance, excessive hunger, excessive thirst, and excessive urination. Heme:  Denies abnormal bruising and bleeding. Allergy:  Denies hives or rash and itching eyes.  Physical Exam  General:  elderly pleasant female, adequately l-nourished,in no acute distress; alert,appropriate and cooperative throughout examination Head:  Normocephalic and atraumatic without obvious abnormalities.  No apparent alopecia or balding. Eyes:  No corneal or conjunctival inflammation noted. EOMI. Perrla. Funduscopic exam benign, without hemorrhages or  exudates  Vision grossly normal. Ears:  External ear exam shows no significant lesions or deformities.  Otoscopic examination reveals clear canals, tympanic membranes are intact bilaterally without bulging, retraction, inflammation or discharge. Hearing is grossly normal bilaterally. Nose:  External nasal examination shows no deformity or inflammation. Nasal mucosa are pink and moist without lesions or exudates. Mouth:  pharynx pink and moist and fair dentition.   Neck:  No deformities, masses, or tenderness noted. Chest Wall:  No deformities, masses, or tenderness noted. Breasts:  No mass, nodules, thickening, tenderness, bulging, retraction, inflamation, nipple discharge or skin changes noted.   Lungs:  Normal respiratory effort, chest expands symmetrically. Lungs are clear to auscultation, no crackles or wheezes.decreased air entry throughout Heart:  Normal rate and regular rhythm. S1 and S2 normal without gallop, murmur, click, rub or other extra sounds. Abdomen:  Bowel sounds positive,abdomen soft and non-tender without masses, organomegaly or hernias noted. Rectal:  No external abnormalities noted. Normal sphincter tone. No rectal masses or tenderness. Genitalia:  Normal introitus for age, no external lesions, no vaginal discharge, mucosa pink and moist, no vaginal or cervical lesions, no vaginal atrophy, no friaility or hemorrhage, normal uterus size and position, no adnexal masses or tenderness Msk:  No deformity or scoliosis noted of thoracic or lumbar spine.   Pulses:  R and L carotid,radial,femoral,dorsalis pedis and posterior tibial pulses are full and equal bilaterally Extremities:  No clubbing, cyanosis, edema, or deformity noted with normal full range of motion of all joints.   Neurologic:  No cranial nerve deficits noted. Station and  gait are normal. Plantar reflexes are down-going bilaterally. DTRs are symmetrical throughout. Sensory, motor and coordinative functions appear intact. Skin:  Intact without suspicious lesions or rashes Cervical Nodes:  No lymphadenopathy noted Axillary Nodes:  No palpable lymphadenopathy Inguinal Nodes:  No significant adenopathy Psych:  Cognition and judgment appear intact. Alert and cooperative with normal attention span and concentration. No apparent delusions, illusions, hallucinations   Impression & Recommendations:  Problem # 1:  SPECIAL SCREENING FOR MALIGNANT NEOPLASMS COLON (ICD-V76.51) Assessment Comment Only  Orders: Hemoccult Guaiac-1 spec.(in office) (82270)  Problem # 2:  DEPRESSION (ICD-311) Assessment: Improved  Her updated medication list for this problem includes:    Ativan 0.5 Mg Tabs (Lorazepam) ..... Half to one tablet at bedtime as needed for sleep    Citalopram Hydrobromide 10 Mg Tabs (Citalopram hydrobromide) .Marland Kitchen... Take 1 tablet by mouth once a day  Problem # 3:  ESSENTIAL HYPERTENSION, BENIGN (ICD-401.1) Assessment: Improved  Her updated medication list for this problem includes:    Cartia Xt 240 Mg Cp24 (Diltiazem hcl coated beads) .Marland Kitchen... Take one tablet by mouth once a day  Orders: T-Basic Metabolic  Panel [phone])  BP today: 124/80 Prior BP: 136/80 (02/18/2010)  Labs Reviewed: K+: 4.4 (03/01/2010) Creat: : 0.98 (03/01/2010)   Chol: 213 (03/01/2010)   HDL: 112 (03/01/2010)   LDL: 93 (03/01/2010)   TG: 42 (03/01/2010)  Problem # 4:  NICOTINE ADDICTION (ICD-305.1) Assessment: Improved  Encouraged smoking cessation and discussed different methods for smoking cessation.   Problem # 5:  PULMONARY NODULE (ICD-518.89) Assessment: Comment Only  Orders: Radiology Referral (Radiology)  Complete Medication List: 1)  Calcium/vitamin D/minerals 600-200 Mg-unit Tabs (Calcium carbonate-vit d-min) .... Take one tablet by mouth twice a day 2)  Vitamin  E 400 Unit Caps (Vitamin e) .... Take one tablet by mouth once a day 3)  Cartia Xt 240 Mg Cp24 (Diltiazem hcl coated beads) .... Take one tablet by mouth once a day 4)  Fish Oil 1000 Mg Caps (Omega-3 fatty acids) .... Take two caplets once a ay 5)  Proventil Hfa 108 (90 Base) Mcg/act Aers (Albuterol sulfate) .... 2 puffs every 4-6 hours as needed 6)  Adult Aspirin Ec Low Strength 81 Mg Tbec (Aspirin) .... Take 1 tablet by mouth once a day 7)  Oxygen  .... 2l at bedtime 8)  Spiriva Handihaler 18 Mcg Caps (Tiotropium bromide monohydrate) .... Inhale contents of 1 capsule once a day 9)  Advair Diskus 250-50 Mcg/dose Misc (Fluticasone-salmeterol) .... Use one puff twice a day 10)  Flonase 50 Mcg/act Susp (Fluticasone propionate) .Marland Kitchen.. 1 to2 puffs per nostril daily as needed for uncontrolled allergies 11)  Trilipix 135 Mg Cpdr (Choline fenofibrate) .... Take 1 tab by mouth at bedtime 12)  Claritin 10 Mg Tabs (Loratadine) .... Take 1 tablet by mouth once a day as needed 13)  Ativan 0.5 Mg Tabs (Lorazepam) .... Half to one tablet at bedtime as needed for sleep 14)  Protonix 40 Mg Tbec (Pantoprazole sodium) .... Take 1 tablet by mouth once a day 15)  Tessalon Perles 100 Mg Caps (Benzonatate) .... One cap by mouth three times a day 16)  Citalopram Hydrobromide 10 Mg Tabs (Citalopram hydrobromide) .... Take 1 tablet by mouth once a day 17)  Co-enzyme Q-10 30 Mg Caps (Coenzyme q10) .... Take 1 tablet by mouth once a day  Other Orders: Pelvic & Breast Exam ( Medicare)  (E3329) T-Lipid Profile ([id number])  Patient Instructions: 1)  Please schedule a follow-up appointment in 4 months. 2)  Tobacco is very bad for your health and your loved ones! You Should stop smoking!. 3)  Stop Smoking Tips: Choose a Quit date. Cut down before the Quit date. decide what you will do as a substitute when you feel the urge to smoke(gum,toothpick,exercise). 4)  Pls follow a low fat diet. 5)  No med changes at this  time   Orders Added: 1)  Est. Patient 65& > [99397] 2)  Pelvic & Breast Exam ( Medicare)  [G0101] 3)  T-Basic Metabolic Panel [[id number]] 4)  T-Lipid Profile [[id number]] 5)  Hemoccult Guaiac-1 spec.(in office) [82270] 6)  Radiology Referral [Radiology]    Laboratory Results  Date/Time Received: March 03, 2010  Date/Time Reported: March 03, 2010   Stool - Occult Blood Hemmoccult #1: negative Date: 03/03/2010 Comments: 51301 10/13 5030 06/2012

## 2010-03-24 NOTE — Letter (Signed)
Summary: Letter  Letter   Imported By: Lind Guest 03/07/2010 15:04:04  _____________________________________________________________________  External Attachment:    Type:   Image     Comment:   External Document

## 2010-03-30 NOTE — Progress Notes (Signed)
Summary: pt had the chest ct yesterday  Phone Note Call from Patient   Caller: Patient Call For: DR [facility] Summary of Call: Patient phoned stated that Dr Delton Coombes wanted her to let him know when she had her next Chest CT and she had it yesterday at [facility]. She was going to sign a release but the gentleman told her that he would be able to view it. Patient can be reached [phone] Initial call taken by: Vedia Coffer,  March 17, 2010 9:10 AM  Follow-up for Phone Call        Pt calling to let RB know she had a repeat CT chest on 03/16/2010 at [facility].  Dr. [name] told her that nodules had not changed and she wouldn't need another scan for approx 18 months. Pt is requesting that RB review the scan and to let her know his thoughts. Pt aware RB out of the office until Mon., 1/30. Will forward msg to RB. Follow-up by: Michel Bickers CMA,  March 17, 2010 10:44 AM  Additional Follow-up for Phone Call Additional follow up Details #1::        Please let her know I looked at the scan, shows her nodules are unchanged compared with her prior scan in 7/11. Will need to be followed for stability. Thanks, Leslye Peer MD  March 17, 2010 9:57 PM  Additional Follow-up by: Leslye Peer MD,  March 17, 2010 9:57 PM    Additional Follow-up for Phone Call Additional follow up Details #2::    Called, spoke with pt.  She was informed of above results and recs per RB.  She verbalized understanding of this.  Follow-up by: Gweneth Dimitri RN,  March 21, 2010 10:02 AM

## 2010-05-23 LAB — POCT CARDIAC MARKERS: Troponin i, poc: <0.05 ng/mL (ref 0.00–0.09)

## 2010-05-23 LAB — BASIC METABOLIC PANEL
BUN: 18 mg/dL (ref 6–23)
GFR calc non Af Amer: 34 mL/min — ABNORMAL LOW (ref >60)
Glucose, Bld: 98 mg/dL (ref 70–99)
Potassium: 4.2 mEq/L (ref 3.5–5.1)

## 2010-05-23 LAB — CBC
HCT: 38.8 % (ref 36.0–46.0)
MCV: 91.9 fL (ref 78.0–100.0)
Platelets: 316 10*3/uL (ref 150–400)
RDW: 13.3 % (ref 11.5–15.5)

## 2010-05-23 LAB — DIFFERENTIAL
Basophils Absolute: 0.3 10*3/uL — ABNORMAL HIGH (ref 0.0–0.1)
Eosinophils Absolute: 0.2 10*3/uL (ref 0.0–0.7)
Eosinophils Relative: 2 % (ref 0–5)
Lymphocytes Relative: 24 % (ref 12–46)

## 2010-06-09 ENCOUNTER — Other Ambulatory Visit: Payer: Self-pay | Admitting: Family Medicine

## 2010-06-13 ENCOUNTER — Other Ambulatory Visit: Payer: Self-pay

## 2010-06-13 ENCOUNTER — Encounter: Payer: Self-pay | Admitting: Family Medicine

## 2010-06-13 MED ORDER — LORAZEPAM 0.5 MG PO TABS: 0.5000 mg | ORAL_TABLET | Freq: Four times a day (QID) | ORAL | Status: DC | PRN

## 2010-06-14 ENCOUNTER — Other Ambulatory Visit: Payer: Self-pay | Admitting: Family Medicine

## 2010-06-14 ENCOUNTER — Ambulatory Visit (INDEPENDENT_AMBULATORY_CARE_PROVIDER_SITE_OTHER): Payer: Medicare Other | Admitting: Family Medicine

## 2010-06-14 ENCOUNTER — Encounter: Payer: Self-pay | Admitting: Family Medicine

## 2010-06-14 VITALS — BP 140/70 | HR 106 | Resp 18 | Ht 62.0 in | Wt 99.0 lb

## 2010-06-14 DIAGNOSIS — J449 Chronic obstructive pulmonary disease, unspecified: Secondary | ICD-10-CM

## 2010-06-14 DIAGNOSIS — F411 Generalized anxiety disorder: Secondary | ICD-10-CM

## 2010-06-14 DIAGNOSIS — F419 Anxiety disorder, unspecified: Secondary | ICD-10-CM

## 2010-06-14 DIAGNOSIS — Z79899 Other long term (current) drug therapy: Secondary | ICD-10-CM

## 2010-06-14 DIAGNOSIS — R7301 Impaired fasting glucose: Secondary | ICD-10-CM

## 2010-06-14 DIAGNOSIS — R946 Abnormal results of thyroid function studies: Secondary | ICD-10-CM

## 2010-06-14 DIAGNOSIS — R259 Unspecified abnormal involuntary movements: Secondary | ICD-10-CM

## 2010-06-14 DIAGNOSIS — R251 Tremor, unspecified: Secondary | ICD-10-CM

## 2010-06-14 DIAGNOSIS — R5381 Other malaise: Secondary | ICD-10-CM

## 2010-06-14 MED ORDER — LORAZEPAM 0.5 MG PO TABS: 0.5000 mg | ORAL_TABLET | Freq: Four times a day (QID) | ORAL | Status: DC | PRN

## 2010-06-14 NOTE — Patient Instructions (Signed)
F/u in 3 months pls wean off citalopram, one 3 days this week, Mon, WEd and Friday, then next Tuesday and Thursday, then stop.  You will be referred to a neurologist to evaluate your tremor, and difficulty with fine movement.  Pls attempt to increase your food intake  Labs today

## 2010-06-14 NOTE — Progress Notes (Signed)
  Subjective:    Patient ID: [name], female    DOB: October 27, 1940, 70 y.o.   MRN: [id number]  HPI Pt continues to lose weight, states her apetite is good, is being followed by GI, and has an upcoming appt. Reports significant heatrburn, I advise she report this when she goes. Reports inability to type , difficulty using a spoon, increased tremor, legs are weak and loss of muscle Mass Wants to get off citalopram, states it is causing her to hallucinate. Requesting lighter oxygen tank, has appt with pulmonary and have them address this  Reports lightheadedness when she gets up quickly, has noted her bp low at times   Review of Systems Denies recent fever or chills.c/o increased fatigue and weakness Denies sinus pressure, nasal congestion, ear pain or sore throat. Denies chest congestion, productive cough or wheezing.Has significant shortness of breath with minimal activity and smokes approx 7 ciggs per day. Trying to quit Denies chest pains, palpitations, paroxysmal nocturnal dyspnea, orthopnea and leg swelling Denies abdominal pain, nausea, vomiting,diarrhea or constipation.  Denies rectal bleeding or change in bowel movement. Denies dysuria, frequency, hesitancy or incontinence. Denies joint pain, swelling and limitation in mobility. Denies headaches, seizure, numbness, or tingling. Denies depression, anxiety or insomnia. Denies skin break down or rash.        Objective:   Physical Exam Patient alert and oriented and in no Cardiopulmonary distress.Appears undernourished and frail. Marked tremor, even at rest  HEENT: No facial asymmetry, EOMI, no sinus tenderness, TM's clear, Oropharynx pink and moist.  Neck supple no adenopathy.  Chest: Clear to auscultation bilaterally.Markedly decreased air entry bilaterally  CVS: S1, S2  murmurs, no S3.  ABD: Soft non tender. Bowel sounds normal.  Ext: No edema  MS: Adequate ROM spine, shoulders, hips and knees.Muscle loss in lower  extremities  Skin: Intact, no ulcerations or rash noted.  Psych: Good eye contact, normal affect. Memory intact not anxious or depressed appearing.  CNS: CN 2-12 intact,decreased  Power and  tone and normal  Sensationthroughout, possible cogwheeling        Assessment & Plan:  1. Undernutrition with continued weight loss and tremor: rept TSH. Counseled re need for small frequent meals and meal supplements. 2.COPD :deteriorating 3. Nicotine use : cessation counseling done 4.Depression: resolved: wean off citalopram. 5.Parkinsonism: questionable, neurology to evaluate.

## 2010-06-15 ENCOUNTER — Encounter: Payer: Self-pay | Admitting: Family Medicine

## 2010-06-15 ENCOUNTER — Other Ambulatory Visit: Payer: Self-pay | Admitting: Family Medicine

## 2010-06-15 LAB — HEPATIC FUNCTION PANEL
ALT: 10 U/L (ref 0–35)
Bilirubin, Direct: 0.1 mg/dL (ref 0.0–0.3)
Indirect Bilirubin: 0.3 mg/dL (ref 0.0–0.9)
Total Bilirubin: 0.4 mg/dL (ref 0.3–1.2)

## 2010-06-15 LAB — BASIC METABOLIC PANEL
Chloride: 101 mEq/L (ref 96–112)
Creat: 0.87 mg/dL (ref 0.40–1.20)
Potassium: 4.2 mEq/L (ref 3.5–5.3)

## 2010-06-15 LAB — CBC WITH DIFFERENTIAL/PLATELET
Basophils Absolute: 0 10*3/uL (ref 0.0–0.1)
Eosinophils Relative: 1 % (ref 0–5)
Lymphocytes Relative: 21 % (ref 12–46)
Neutro Abs: 6 10*3/uL (ref 1.7–7.7)
Platelets: 383 10*3/uL (ref 150–400)
RDW: 13.7 % (ref 11.5–15.5)
WBC: 8.5 10*3/uL (ref 4.0–10.5)

## 2010-06-15 LAB — HEMOGLOBIN A1C
Hgb A1c MFr Bld: 6 % — ABNORMAL HIGH (ref <5.7)
Mean Plasma Glucose: 126 mg/dL — ABNORMAL HIGH (ref <117)

## 2010-06-15 LAB — TSH: TSH: 0.178 u[IU]/mL — ABNORMAL LOW (ref 0.350–4.500)

## 2010-06-15 LAB — T3, FREE: T3, Free: 2.8 pg/mL (ref 2.3–4.2)

## 2010-06-17 ENCOUNTER — Telehealth: Payer: Self-pay | Admitting: Family Medicine

## 2010-06-17 NOTE — Telephone Encounter (Signed)
noted 

## 2010-06-17 NOTE — Telephone Encounter (Signed)
pls note, cancel referral to neurology

## 2010-06-17 NOTE — Telephone Encounter (Signed)
Called patient and she doesn't want the referral to the neurologist. She is sure the tremor is coming from her thyroid problems.Is just going to the endo for right now. Wanted you to be aware

## 2010-06-21 ENCOUNTER — Other Ambulatory Visit (HOSPITAL_COMMUNITY): Payer: PRIVATE HEALTH INSURANCE

## 2010-06-21 ENCOUNTER — Ambulatory Visit (HOSPITAL_COMMUNITY): Payer: Medicare Other

## 2010-06-23 ENCOUNTER — Ambulatory Visit (HOSPITAL_COMMUNITY)
Admission: RE | Admit: 2010-06-23 | Discharge: 2010-06-23 | Disposition: A | Discharge status: Home / Self Care | Payer: Medicare Other | Source: Ambulatory Visit | Attending: Family Medicine | Admitting: Family Medicine

## 2010-06-23 DIAGNOSIS — R946 Abnormal results of thyroid function studies: Secondary | ICD-10-CM

## 2010-06-23 DIAGNOSIS — E042 Nontoxic multinodular goiter: Secondary | ICD-10-CM | POA: Insufficient documentation

## 2010-06-27 ENCOUNTER — Telehealth: Payer: Self-pay | Admitting: Family Medicine

## 2010-06-27 NOTE — Telephone Encounter (Signed)
The order was placed since 4/25, go to referrals in chart review pls and you will see it, pls make appt with dr Fransico Him, you will also need to fax over her recent labs

## 2010-06-27 NOTE — Telephone Encounter (Signed)
She has appointment for 5.9.12 @ 11:30 Dr. Dennie Bible office had done called her they said [name] said that they had not called. I gave her the appointment day and time and the telephone #

## 2010-06-29 ENCOUNTER — Other Ambulatory Visit (HOSPITAL_COMMUNITY): Payer: Self-pay | Admitting: "Endocrinology

## 2010-06-29 DIAGNOSIS — R7989 Other specified abnormal findings of blood chemistry: Secondary | ICD-10-CM

## 2010-06-30 ENCOUNTER — Ambulatory Visit: Payer: Self-pay | Admitting: Family Medicine

## 2010-06-30 ENCOUNTER — Ambulatory Visit (INDEPENDENT_AMBULATORY_CARE_PROVIDER_SITE_OTHER): Payer: Medicare Other | Admitting: Cardiology

## 2010-06-30 ENCOUNTER — Encounter (HOSPITAL_COMMUNITY)
Admission: RE | Admit: 2010-06-30 | Discharge: 2010-06-30 | Disposition: A | Discharge status: Home / Self Care | Payer: Medicare Other | Source: Ambulatory Visit | Attending: "Endocrinology | Admitting: "Endocrinology

## 2010-06-30 ENCOUNTER — Encounter: Payer: Self-pay | Admitting: Cardiology

## 2010-06-30 VITALS — BP 148/79 | HR 95 | Ht 61.0 in | Wt 97.0 lb

## 2010-06-30 DIAGNOSIS — E349 Endocrine disorder, unspecified: Secondary | ICD-10-CM | POA: Insufficient documentation

## 2010-06-30 DIAGNOSIS — R7989 Other specified abnormal findings of blood chemistry: Secondary | ICD-10-CM

## 2010-06-30 DIAGNOSIS — I359 Nonrheumatic aortic valve disorder, unspecified: Secondary | ICD-10-CM

## 2010-06-30 DIAGNOSIS — Z954 Presence of other heart-valve replacement: Secondary | ICD-10-CM

## 2010-06-30 DIAGNOSIS — I10 Essential (primary) hypertension: Secondary | ICD-10-CM

## 2010-06-30 MED ORDER — SODIUM IODIDE I 131 CAPSULE
10.0000 | Freq: Once | INTRAVENOUS | Status: AC | PRN
  Administered 2010-06-30: 8 via ORAL

## 2010-06-30 NOTE — Patient Instructions (Signed)
**Note De-identified  Obfuscation** Your physician recommends that you continue on your current medications as directed. Please refer to the Current Medication list given to you today.  Your physician recommends that you schedule a follow-up appointment in: 1 year  

## 2010-06-30 NOTE — Progress Notes (Signed)
Clinical Summary [name] is a 70 y.o.female presenting for an annual followup. She has had other chronic health problems, possibly hyperthyroidism diagnosed recently, also escalating use of oxygen under the direction of her pulmonary physician.  She reports no chest pain. She did have some intermittent palpitations a few months ago, however these have resolved. No falls or syncope. She reports compliance with her medications.  Echocardiogram from last year is noted below.   Allergies  Allergen Reactions  . Bupropion Hcl   . Cyclobenzaprine Hcl   . Gadolinium      Desc: PT REFUSES MR & CT CONTRAST DUE TO PRIOR REACTION, PT STATES HAIR FELL OUT & SKIN CHANGED COLOR   . Levofloxacin   . Penicillins   . Statins   . Sulfonamide Derivatives     Current outpatient prescriptions:ADVAIR DISKUS 250-50 MCG/DOSE AEPB, USE ONE PUFF TWICE A DAY, Disp: 180 each, Rfl: 0;  albuterol (PROVENTIL HFA) 108 (90 BASE) MCG/ACT inhaler, Inhale 2 puffs into the lungs every 6 (six) hours as needed.  , Disp: , Rfl: ;  aspirin (ADULT ASPIRIN EC LOW STRENGTH) 81 MG EC tablet, Take 81 mg by mouth daily.  , Disp: , Rfl:  benzonatate (TESSALON PERLES) 100 MG capsule, Take 100 mg by mouth 3 (three) times daily.  , Disp: , Rfl: ;  Calcium Carbonate-Vit D-Min (QC CALCIUM/MINERALS/VITAMIN D) 600-400 MG-UNIT TABS, Take by mouth.  , Disp: , Rfl: ;  Choline Fenofibrate (TRILIPIX) 135 MG capsule, Take 135 mg by mouth daily.  , Disp: , Rfl: ;  diltiazem (CARTIA XT) 240 MG 24 hr capsule, Take 240 mg by mouth daily.  , Disp: , Rfl:  fluticasone (FLONASE) 50 MCG/ACT nasal spray, 2 sprays by Nasal route daily as needed.  , Disp: , Rfl: ;  loratadine (CLARITIN) 10 MG tablet, Take 10 mg by mouth daily.  , Disp: , Rfl: ;  LORazepam (ATIVAN) 0.5 MG tablet, Take 1 tablet (0.5 mg total) by mouth every 6 (six) hours as needed., Disp: 90 tablet, Rfl: 3;  Omega-3 Fatty Acids (FISH OIL CONCENTRATE) 1000 MG CAPS, Take by mouth.  , Disp: , Rfl:    Oxygen Permeable Lens Products KIT, by Does not apply route. 2L AT BEDTIME , Disp: , Rfl: ;  pantoprazole (PROTONIX) 40 MG tablet, Take 40 mg by mouth daily.  , Disp: , Rfl: ;  propranolol (INDERAL) 10 MG tablet, Take 10 mg by mouth 2 (two) times daily.  , Disp: , Rfl: ;  tiotropium (SPIRIVA HANDIHALER) 18 MCG inhalation capsule, Place 18 mcg into inhaler and inhale daily.  , Disp: , Rfl:  vitamin E (VITAMIN E) 400 UNIT capsule, Take 400 Units by mouth daily.  , Disp: , Rfl:  No current facility-administered medications for this visit. Facility-Administered Medications Ordered in Other Visits: sodium iodide (I-131) capsule 10 micro curie, 10 micro curie, Oral, Once PRN, Medication Radiologist, 8 micro curie at 06/30/10 1420  Past Medical History  Diagnosis Date  . Rheumatic fever   . Renal insufficiency   . Aortic stenosis   . Hyperlipidemia   . Hypertension   . COPD with chronic bronchitis   . Osteoporosis   . Sebaceous cyst   . Colon cancer     Social History [name] reports that she has been smoking Cigarettes.  She has a 25 pack-year smoking history. She has never used smokeless tobacco. [name] reports that she does not drink alcohol.  Review of Systems She has been experiencing weight  loss, cold feelings, no fevers however or cough. States she was diagnosed with thyroid disease, possibly hyperthyroid, undergoing evaluation. No falls or syncope. No unusual rashes. Reports compliance with medications.  Physical Examination Filed Vitals:   06/30/10 1436  BP: 148/79  Pulse: 95   Thin woman in no acute distress.   HEENT: Conjunctiva and lids normal, oropharynx clear with moist mucosa.   Neck: Supple, no elevated jugular venous pressure, probable referred cardiac murmur to carotids. No thyromegaly.   Lungs: Diminished breath sounds throughout, prolonged expiratory phase, no wheezing or labored breathing.   Cardiac: Regular rate and rhythm, 2/6 systolic murmur at the base  radiating towards the carotids, no S3 gallop is evident but  PMI indistinct.   Abdomen: Soft, nontender, normal bowel sounds.   Skin: Warm and dry.   Extremities: No pitting edema, distal pulses one plus.   Musculoskeletal: No gross deformities.   Neuropsychiatric: Alert and oriented x3, affect appropriate.   ECG Sinus rhythm at 86 with biatrial enlargement and nonspecific ST-T changes, decreased R wave progression.  Studies Echocardiogram 05/04/2009: LVEF 60% without regional wall motion abnormalities, bioprosthetic aortic valve without regurgitation, mean gradient 19 mm mercury, peak gradient 35 mm mercury.  Problem List and Plan

## 2010-06-30 NOTE — Assessment & Plan Note (Signed)
Continue medical therapy and followup with primary care.

## 2010-06-30 NOTE — Assessment & Plan Note (Signed)
Status post bioprosthetic aortic valve replacement as noted, stable by echocardiogram last year. No change in murmur on examination. Continue observation.

## 2010-07-01 ENCOUNTER — Encounter (HOSPITAL_COMMUNITY): Payer: Medicare Other

## 2010-07-01 MED ORDER — SODIUM PERTECHNETATE TC 99M INJECTION
10.0000 | Freq: Once | INTRAVENOUS | Status: AC | PRN
  Administered 2010-07-01: 9.3 via INTRAVENOUS

## 2010-07-05 ENCOUNTER — Encounter: Payer: Self-pay | Admitting: Cardiology

## 2010-07-05 NOTE — Assessment & Plan Note (Signed)
[facility]                       [place] [facility] OFFICE NOTE   NANDANA, KROLIKOWSKI                         MRN:          [id number]  DATE:08/29/2007                            DOB:          06-19-1940    IDENTIFICATION:  Ms. [name] is a 70 year old woman with a history of  aortic valve disease (status post AVR with bioprosthesis in 2004),  trivial CAD, hypertension, and dyslipidemia.  She also has a significant  history of COPD.   I last saw her in May. When I saw her, I switched her to Cozaar from an  ACE inhibitor for concern of airway instability.   Since seen, the patient has been seen by Levy Pupa who I have  referred her to.  She has changed her inhalers some and said she is  breathing much better, the past 2 days are the only days it has been  rough.  Secretions have improved.  She also thinks switching to the  Cozaar may have helped some.   She is followed by Dr. [name] and has been on the red yeast rice,  TriLipix has also been added.  She is due for return for lipids soon.   CURRENT MEDICINES:  1. Cardizem 240.  2. Calcium t.i.d.  3. Cozaar 50.  4. Aspirin 325.  5. TriLipix 45.  6. Red yeast rice 600 b.i.d.  7. Fish oil 1 g b.i.d.  8. Inhalers, unsure what.   PHYSICAL EXAMINATION:  GENERAL:  The patient is in no distress at rest.  VITAL SIGNS:  Blood pressure is 118/60, pulse is 80, and weight 119.  LUNGS:  Mild wheezes, diffuse rhonchi, and moving air.  CARDIAC:  Regular rate and rhythm, S1 and S2, grade 1-2/6 systolic  murmur heard best at the base.  ABDOMEN:  Benign.  EXTREMITIES:  No edema.   IMPRESSION:  1. Aortic valve disease status post aortic valve replacement.  Last      echo was in 2007.  We will get a repeat, this was also requested by      Dr. Delton Coombes.  2. Hypertension, good.  We would keep on current regimen.  3. Arm pain, rare.  Probably musculoskeletal.  4. Chronic obstructive pulmonary disease.  The  patient says she is      quitting smoking.  Continue to follow up with Dr. Delton Coombes in this      fall.  5. Dyslipidemia.  We will need to follow up with lipids.   NOTE:  She says she cannot tolerate statins, so she is taking red yeast  rice.  She actually admits today she does not think she has tried  Crestor, this may an option down the road.  She will follow up with the  lipids that Dr. [name] does.   Otherwise, set followup for December or sooner if problems develop.   ADDENDUM:  We will order echo.     Pricilla Riffle, MD, [facility]  Electronically Signed    PVR/MedQ  DD: 08/29/2007  DT: 08/30/2007  Job #: [id number]   cc:   Milus Mallick. [name], M.D.  Leslye Peer, MD

## 2010-07-05 NOTE — Assessment & Plan Note (Signed)
[facility]                       [place] CARDIOLOGY OFFICE NOTE   CAITLIN, AINLEY                         MRN:          [id number]  DATE:07/08/2007                            DOB:          October 19, 1940    IDENTIFICATION:  Ms. Fuerte is a 70 year old woman she has a history of  aortic valve disease status post aortic bioprosthesis in 2004, also  minimal CAD and hypertension.   I last saw her in clinic back in November 2007.   The patient says over the past 6 months she has had a lot of difficulty  with her COPD with exacerbation.  She is followed by Dr. [name].  She  has been on steroids and antibiotics.   She notes some episodes of left arm numbness. It usually occurs when she  sleeps in the recliner in a certain position.  She will get up and her  up and her arm will be numb, when she gets out of the chair it will  improve.  She denies any symptoms with activities.  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   CURRENT MEDICINES:  1. Altace 5 b.i.d.  2. Cardizem 240.  3. Calcium t.i.d.   PHYSICAL EXAM:  The patient is in no distress.  Blood pressure is  112/70, pulse is 66, weight 114, down from 139 on last visit a year and  a half ago.  LUNGS:  Decreased air flow, mild wheezes.  CARDIAC:  Regular rate and rhythm, S1-S2, grade 1-2/6 systolic murmur  heard best at the base.  ABDOMEN: Benign.  EXTREMITIES:  No edema.   IMPRESSION:  1. Aortic valve disease status post AVR. Echocardiogram was done in      2007, showing a normal functioning valve, normal left ventricular      function.  2. Hypertension.  On agents.  The ACE inhibitor may be adding to some      airway instability. I will tell her to switch over to Cozaar or      another ARB and will need follow-up.  3. Arm pain.  The patient has a minimal coronary artery disease by      cath 4 years ago.  She has arm pain that seems to be more related      to her positioning and possibly  radiating from issues in her neck.      I would follow for now.  I told her to start an aspirin, which she      should have been on, and I have given her a prescription for nitro.      If there is any progression we will evaluate, but with her lung      issues, I would not press for now.  4. Health care maintenance/dyslipidemia.  THE PATIENT HAS NOT BEEN      TOLERANT OF STATINS. She says she has tried them all and also tried      Zetia.  Her last check was in April. Dr. Anthony Sar HDL was 67,      triglycerides 200.  Her LDL was 183. Dr.  [name] will follow up      with her on the fibric acid derivative.   I have set the patient up to see Jasmine Awe in [facility].  I will  set to see the patient back later this summer. We will be in touch with  her regarding the switch for Cozaar or another ARB.     Pricilla Riffle, MD, [facility]  Electronically Signed    PVR/MedQ  DD: 07/08/2007  DT: 07/08/2007  Job #: [id number]

## 2010-07-05 NOTE — Op Note (Signed)
NAME:  [name], [name]                  ACCOUNT NO.:  [id number]   MEDICAL RECORD NO.:  [id number]          PATIENT TYPE:  AMB   LOCATION:  DAY                           FACILITY:  [facility]   PHYSICIAN:  R. Roetta Sessions, M.D. DATE OF BIRTH:  1941/01/22   DATE OF PROCEDURE:  07/23/2008  DATE OF DISCHARGE:                               OPERATIVE REPORT   PROCEDURE PERFORMED:  Surveillance colonoscopy.  Colonoscopy with  biopsy.   INDICATIONS FOR PROCEDURE:  A 70 year old lady with a history of right  hemicolectomy for apparent polyp several years ago who has been  surveilled by Bernette Redbird, M.D. as recently as 3 years ago for a  history of colonic polyps.  She has no lower GI tract symptoms at this  time.  She is here for surveillance.  The risks, benefits and  limitations have been reviewed, questions answered.  Please see  documentation in the medical record.   PROCEDURE NOTE:  O2 saturation, blood pressure, pulse and respirations  were monitored throughout entire procedure.   CONSCIOUS SEDATION:  Versed 5 mg IV, Demerol 100 grams IV in divided  doses.   INSTRUMENT USED:  Pentax video chip system pediatric colonoscope.   FINDINGS:  Digital rectal exam revealed no abnormalities.   ENDOSCOPIC FINDINGS:  The prep was marginal.  Colon:  Colonic mucosa was surveyed from the rectosigmoid junction  through the left transverse colon to the anastomosis with small bowel.  There were several 3-4 mm areas of ulceration on the colonic side at the  anastomosis. The anastomosis itself and the distal 10 cm of neoterminal  ileum appeared entirely normal.  Biopsies of the ulcers were taken.  From this level the scope was slowly and cautiously withdrawn.  All  previously mentioned mucosal surfaces were again seen.  The prep was  poor.  There was vegetable matter throughout her colon which kept  clogging the scope and was difficult to wash away.  For this reason a  small lesion may have been obscured  by the poor prep but no gross  abnormalities were observed.  The scope was pulled down in the rectum  where a thorough examination of the rectal mucosa including retroflexed  view of the anal verge demonstrated a single diminutive polyp in at 10  cm which was cold biopsy/removed.  Remainder of the rectal mucosa  appeared normal.  The patient tolerated the procedure well and was  reacted in endoscopy.   IMPRESSION:  1. Single diminutive polyp in the rectum status post cold biopsy      removal.  2. Status post right hemicolectomy.  3. Colonic ulcers in the upstream end the colon distal to the      anastomosis as described above.  Status post biopsy. The      anastomosis itself and neoterminal ileum appeared normal.   RECOMMENDATIONS:  1. Follow-up on path.  2. Further recommendations to follow.      Jonathon Bellows, M.D.  Electronically Signed     RMR/MEDQ  D:  07/23/2008  T:  07/23/2008  Job:  [id number]  cc:   Milus Mallick. [name], M.D.  Fax: [phone]

## 2010-07-05 NOTE — H&P (Signed)
NAME:  [name], [name]                  ACCOUNT NO.:  [id number]   MEDICAL RECORD NO.:  [id number]          PATIENT TYPE:  AMB   LOCATION:  DAY                           FACILITY:  [facility]   PHYSICIAN:  Dalia Heading, M.D.  DATE OF BIRTH:  July 24, 1940   DATE OF ADMISSION:  DATE OF DISCHARGE:  LH                              HISTORY & PHYSICAL   CHIEF COMPLAINT:  Sebaceous cyst, back and neck.   HISTORY OF PRESENT ILLNESS:  The patient is a 70 year old white female  who was referred for evaluation and treatment of multiple sebaceous  cysts.  She would like 3 removed from her back and neck as they are  draining occasionally and increasing in size.   PAST MEDICAL HISTORY:  Includes COPD and hypertension.   PAST SURGICAL HISTORY:  Appendectomy, laminectomy, cholecystectomy,  cataract surgery, aortic valve replacement, and partial colectomy.   CURRENT MEDICATIONS:  Cozaar, Cartia, Advair, Proventil, and Combivent  inhalers.   ALLERGIES:  PENICILLIN, SULFA, STATIN, WELLBUTRIN, VIOXX, and  HYDROCODONE.   REVIEW OF SYSTEMS:  The patient smokes a half-pack cigarettes a day.  She denies alcohol use.  She denies any other cardiac pulmonary  difficulties or bleeding disorders.   PHYSICAL EXAMINATION:  GENERAL:  The patient is a well-developed well-  nourished white female in no acute distress.  VITAL SIGNS:  Oxygen saturation is 93% on room air.  LUNGS:  Intermittent wheezing bilaterally.  HEART:  Regular rate and rhythm without S3, S4, or murmurs.  SKIN:  Reveals 2 cm sebaceous cysts on her left neck, right in central  portions of the back.  Punctums are present.   IMPRESSION:  Sebaceous cysts, back and neck x3.   PLAN:  The patient scheduled for excision of the sebaceous cysts, back  and neck, on September 13, 2007.  The risks and benefits of the procedure  including bleeding, infection, recurrence of the cyst were fully  explained to the patient, who gave informed consent.      Dalia Heading, M.D.  Electronically Signed     MAJ/MEDQ  D:  09/03/2007  T:  09/04/2007  Job:  [id number]   cc:   Milus Mallick. [name], M.D.  Fax: [phone]

## 2010-07-05 NOTE — Op Note (Signed)
[name], [name]                  ACCOUNT NO.:  [id number]   MEDICAL RECORD NO.:  [id number]          PATIENT TYPE:  AMB   LOCATION:  DAY                           FACILITY:  [facility]   PHYSICIAN:  Dalia Heading, M.D.  DATE OF BIRTH:  1940-09-25   DATE OF PROCEDURE:  09/13/2007  DATE OF DISCHARGE:                               OPERATIVE REPORT   PREOPERATIVE DIAGNOSIS:  Sebaceous cysts, neck and back, x3.   POSTOPERATIVE DIAGNOSIS:  Sebaceous cysts, neck and back, x3.   PROCEDURE:  Excision of sebaceous cysts, neck and back.   SURGEON:  Dalia Heading, MD   ANESTHESIA:  General.   INDICATIONS:  The patient is a 70 year old white female with multiple  medical problems who presents with multiple sebaceous cysts on her body.  Three have been enlarging in size and causing her discomfort.  The risks  and benefits of the procedure including bleeding, infection, and  recurrence of the cysts were fully explained to the patient, gave  informed consent.   PROCEDURE NOTE:  The patient was placed in the left lateral decubitus  position after general anesthesia was administered.  The back sebaceous  cysts x2 were prepped and draped using the usual sterile technique with  Betadine.  A surgical site confirmation was performed.  A 0.5%  Sensorcaine was used for local anesthesia.   The both sebaceous cysts of the right posterior axillary line as well as  right mid back were excised in total without difficulty.  Both measured  approximately 2 cm in their greatest diameter.  Both were sent to  Pathology for the examination.  Any bleeding was controlled using Bovie  electrocautery.  The skin was reapproximated using 4-0 nylon sutures.  Betadine ointment and dry sterile dressings were applied.   The patient was returned back to the supine position and a 2-cm  sebaceous cyst along the base of the left neck was excised without  difficulty.  It was sent to Pathology for the examination.  The skin  was  reapproximated using 4-0 nylon interrupted sutures.  Betadine ointment  and dry sterile dressing were applied.   All tape and needle counts were correct at the end of the procedures.  The patient was awakened and transferred to PACU in stable condition.   COMPLICATIONS:  None.   SPECIMEN:  Sebaceous cyst, neck and back.   BLOOD LOSS:  Minimal.      Dalia Heading, M.D.  Electronically Signed     MAJ/MEDQ  D:  09/13/2007  T:  09/13/2007  Job:  [id number]   cc:   Milus Mallick. [name], M.D.  Fax: [phone]

## 2010-07-08 NOTE — Discharge Summary (Signed)
NAMENECOLE, MINASSIAN                  ACCOUNT NO.:  [id number]   MEDICAL RECORD NO.:  [id number]          PATIENT TYPE:  OUT   LOCATION:  RAD                           FACILITY:  [facility]   PHYSICIAN:  Gerrit Friends. Dietrich Pates, MD, FACCDATE OF BIRTH:  [birth year]   DATE OF ADMISSION:  11/20/2005  DATE OF DISCHARGE:                                 DISCHARGE SUMMARY   REFERRING:  1. Dr. [name].  2. Dr. Tenny Craw.   CLINICAL DATA:  [age]-year-old woman with syncope; prior CABG and AVR  surgery.   M-MODE:  Aorta 2.6, left atrium 3.2, septum 1.2, posterior wall 1.0.  LV  diastole 3.0, LV systole 2.0.   FINDINGS:  1. Technically suboptimal, but adequate echocardiographic study.  2. Left atrial size at the upper limit of normal; normal right atrium.  3. Right ventricular size is prominent, but not frankly enlarged; no RVH;      normal RV systolic function.  4. Very mild mitral valve leaflet calcification with mild to moderate      annular calcification.  5. Aortic valve not well-imaged; by Doppler criteria, hemodynamics are      normal for a bioprosthetic device.  6. Normal tricuspid valve with trace regurgitation.  7. Pulmonic valve and proximal pulmonary artery not ideally imaged, but      appear grossly normal.  8. The aortic root is normal in diameter; moderate calcification of the      wall is apparent.  9. Normal left ventricular size; no LVH; normal overall LV systolic      function without apparent segmental wall motion abnormalities.  10.Normal IVC.  11.Comparison with prior study of October 27, 2005:  Interval aortic      valve replacement surgery.      Gerrit Friends. Dietrich Pates, MD, [facility]  Electronically Signed     RMR/MEDQ  D:  11/20/2005  T:  11/21/2005  Job:  [id number]

## 2010-07-08 NOTE — Op Note (Signed)
NAME:  [name], [name]                            ACCOUNT NO.:  [id number]   MEDICAL RECORD NO.:  [id number]                   PATIENT TYPE:  INP   LOCATION:  5706                                 FACILITY:  [facility]   PHYSICIAN:  Ollen Gross. Vernell Morgans, M.D.              DATE OF BIRTH:  [birth year]   DATE OF PROCEDURE:  04/29/2003  DATE OF DISCHARGE:  05/04/2003                                 OPERATIVE REPORT   PREOPERATIVE DIAGNOSIS:  Cecal mass.   POSTOPERATIVE DIAGNOSIS:  Cecal mass.   PROCEDURE:  Right hemicolectomy.   SURGEON:  Ollen Gross. Carolynne Edouard, M.D.   ASSISTANT:  Lebron Conners, M.D.   ANESTHESIA:  General endotracheal.   PROCEDURE:  After informed consent was obtained the patient was brought to  the operating room and placed in the supine position on the operating room  table.  After adequate induction of general anesthesia, the patient's  abdomen was prepped with Betadine and draped in the usual sterile manner.  A  midline incision was made with a 10 blade knife.  This incision was carried  down through the skin and subcutaneous tissue sharply with the  electrocautery until the linea alba was identified.  The linea alba was also  incised with the electrocautery and the preperitoneal space was spread  bluntly with a hemostat until the peritoneum was opened and access was  gained of the abdominal cavity.  The rest of the incision was then opened  under direct vision using the electrocautery.  There were two adhesions to  the anterior abdominal wall.  These were taken down sharply with the  Metzenbaum scissors.  Once this was accomplished, the right groin was  identified and mobilized from its retroperitoneal attachment by incising  along the white line of Toldt.  This allowed mobilization of the right colon  up into the wound.  The hepatic flexure was also taken down in a similar  fashion by mobilizing the splenic flexure away from the duodenum bluntly  with finger dissection as well as  sharply with the electrocautery.  Once  this was accomplished, the entire right colon was able to be brought up into  the midline of the wound.  A site was chosen along the terminal ileum for  division of the bowel and the mesentery at this point was opened sharply  with the electrocautery.  A GIA-75 stapler was placed across the bowel at  this point, clamped and fired, thereby dividing the bowel between staple  lines.  Another site was chosen at the splenic flexure for division of the  colon, where there appeared to be good blood supply and the mesentery at  this point was opened sharply with the electrocautery.  A GIA-75 stapler was  then placed across the bowel at this point, clamped and fired, thereby  dividing the bowel between staple lines.  The mesentery of the right colon  was then taken down by carefully clamping, dividing and ligating all the  vessels in the mesentery near their base, clamping with Kelly clamps and  ligating with 2-0 silk ties.   Once this was accomplished, the colon was then freed from the surrounding  tissue and removed from the patient, the right colon was, and sent to  pathology for further evaluation.  The terminal ileum was easily  approximated to the transverse colon.  The antimesenteric borders of each of  these bowels was approximated using an interrupted 3-0 silk stitch.  Small  enterotomies were made on the mesenteric border using the electrocautery.  Each limb of a GIA-75 stapler was then placed down the according limb of  bowel, clamped and then fired, thereby creating a nice long widely patent  enteroenterostomy.  The enterotomy was then closed using interrupted 3-0  silk stitches.  A single 3-0 silk crotch stitch was placed to take the  tension off the staple line.  The mesenteric defect was closed interrupted  figure-of-eight 3-0 silk stitches.   At this point the abdomen was irrigated with copious amounts of saline,  gloves were changed.  The  rest of the abdomen was inspected, and there were  no other obvious abnormalities.  The operative bed was examined and found to  be hemostatic.  At this point the fascia of the anterior abdominal wall was  closed with two running #1 PDS sutures.  The subcutaneous tissue was  irrigated with copious amounts of saline and the skin was closed with  staples.  The patient tolerated the procedure well.  At the end of the case  all needle, sponge and instrument counts were correct.  The patient was then  awakened and taken to the recovery room in stable condition.                                               Ollen Gross. Vernell Morgans, M.D.    PST/MEDQ  D:  05/05/2003  T:  05/07/2003  Job:  [id number]

## 2010-07-08 NOTE — Assessment & Plan Note (Signed)
[facility]                            CARDIOLOGY OFFICE NOTE   [name]                         MRN:          [id number]  DATE:12/29/2005                            DOB:          12/05/1940    IDENTIFICATION:  [name] is a 70 year old who we saw back in  September.   When I saw her she was complaining of weak spells.  Blood pressure at  times as low as 100 systolic.   When I saw her, I went ahead and recommended that she take her blood  pressure and keep a log of it, especially if she were having a spell.  I  also scheduled an echocardiogram given a history of hypertension.   The patient had the echocardiogram on October 1, this showed normal RV  size upper limits.  Aortic valve prosthesis (bioprosthetic) appeared to  be working well.  LV function was normal.  There was no evidence of LVH.   Patient also had lower extremity Dopplers done that showed no evidence  of occlusive disease.   The patient has taken her blood pressure since seen.  Pressures have  ranged from 86 to 138 systolic.  She says the 86 occurred after eating.  She says she often gets weak and tired after eating a meal.  She has to  be careful what she is doing.   CURRENT MEDICATIONS:  1. Combivent 2 puffs b.i.d.  2. Advair b.i.d.  3. Pulmicort b.i.d.  4. Benicar 30 nightly.  5. Altace 5 b.i.d.  6. Cardizem 240 daily.  7. Coenzyme Q10 b.i.d.  8. Fish oil 1 gram b.i.d.  9. Red yeast rice b.i.d.  10.Flax seed oil b.i.d.   PHYSICAL EXAMINATION:  On exam the patient is in no distress.  Blood pressure is 120/60, pulse is 84.  Weight 139, stable from last  visit.  LUNGS:  Slightly decreased airflow, otherwise clear.  CARDIAC:  Regular rate and rhythm.  Grade 1 to 2 over 6 systolic murmur  at the base.  ABDOMEN:  Benign.  EXTREMITIES:  No edema.   IMPRESSION:  1. Hypertension.  Patient is running a tad low and it may explain some      of her spells.  I told her  to back down on the Benicar to 1 and      possibly a half  nightly.  Continue on any other medicines.  I will      see her back in a few months.  2. Health care maintenance.  Has not tolerated statins or Zetia.  She      is on fish oil, I told her to increase to 3 tablets per day,      continue red yeast rice. No great evidence for coenzyme Q10, can      back down on fish/flax seed oil.  3. Upper respiratory.  Patient says she has some greenish-yellowish      sputum that has increased and we will give her a prescription for Z-      Pak with no refill.  If things  progress she should contact Dr.      [name].  4. Again, I will follow up in several months, sooner if problems      develop.     Pricilla Riffle, MD, [facility]  Electronically Signed    PVR/MedQ  DD: 12/29/2005  DT: 12/30/2005  Job #: [id number]   cc:   Milus Mallick. [name], M.D.

## 2010-07-08 NOTE — Discharge Summary (Signed)
NAME:  [name], [name]                            ACCOUNT NO.:  [id number]   MEDICAL RECORD NO.:  [id number]                   PATIENT TYPE:  INP   LOCATION:  2011                                 FACILITY:  [facility]   PHYSICIAN:  Kerin Perna, M.D.               DATE OF BIRTH:  25-Jul-1940   DATE OF ADMISSION:  12/03/2002  DATE OF DISCHARGE:  12/10/2002                                 DISCHARGE SUMMARY   HISTORY OF PRESENT ILLNESS:  This is a 70 year old white female who was  referred to Kerin Perna, M.D. for potential aortic valve replacement due  to recently diagnosed critical aortic stenosis.  The patient has a recent  diagnosis of a right colon cecal mass and underwent cardiology evaluation  prior to surgery due to her long history of a cardiac murmur.  The patient  has been experiencing progressive shortness of breath with exertion,  decreased exercise tolerance, and increasing fatigue with normal activities.  She denied any orthopnea, PND, or chest pain.  She denied syncope or  dizziness.  She had an echocardiogram done approximately two years ago when  she lived in [place] which apparently indicated mild aortic stenosis.  She has a history of rheumatic heart disease as a child at age 72 and again  at age 17.  She was evaluated by Vida Roller, M.D. who performed an  echocardiogram and stress test.  The echocardiogram was performed on  November 14, 2002 and revealed a peak aortic valve gradient of 93 mmHg with  an estimated area of 0.45 suggestive of critical aortic stenosis.  The left  ventricular size showed hypertrophy and she had a normal left ventricular  systolic function.  The stress test was performed which was negative for  ischemia.  Carotid duplex ultrasound examination showed no significant  carotid artery stenosis.  She subsequently underwent a cardiac  catheterization at [facility] by Carole Binning, M.D. [facility].  This  demonstrated normal  coronaries with preserved left ventricular systolic  function.  Pulmonary artery pressure was 45/35 with a wedge of 17.  Her mean  transvalvular aortic gradient was 34 mmHg and calculated aortic valve area  was 0.6.  The patient was, as stated, referred to Kerin Perna, M.D. who  recommends aortic valve replacement.  Of note, the patient was also seen  recently by a local dentist who removed two necrotic teeth on September 17.  She has been evaluated for hemoccult-positive stool with a colonoscopy  performed by Jerolyn Shin C. Katrinka Blazing, M.D.  This apparently demonstrated a mass in  the cecum.  Subsequent biopsy was scheduled to be done on October 13 by  Fayrene Fearing L. Randa Evens, M.D. of [place].  There apparently is no evidence of  hepatic involvement of this presumed colon cancer and her liver function  tests have been normal.  She states she has not had a CT scan  of the  abdomen.  The patient was admitted this hospitalization for aortic valve  replacement planned prior to her colon surgery.   PAST MEDICAL HISTORY:  1. 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  2. Chronic anxiety.  3. Hypertension.  4. History of cataract surgery.  5. History of lumbar laminectomy.  6. Status post excision of a benign breast mass.  7. Status post cholecystectomy.   MEDICATIONS ON ADMISSION:  1. Advair Diskus one puff b.i.d.  2. Combivent two puffs q.i.d.  3. Prednisone 10 mg daily recently started following a prednisone taper in     preparation for colon surgery.  4. Hydrochlorothiazide 12.5 mg daily.  5. Zyrtec 10 mg daily.  6. Singulair 10 mg q.h.s.  7. Tiazac XT 300 mg daily.   ALLERGIES:  The patient has multiple allergies including PENICILLIN,  CEPHALOSPORINS, WELLBUTRIN, ALL STATINS, FLEXERIL, and also is noted to have  an allergy to LATEX and __________, ADENOSINE, HYDROCODONE.   SOCIAL HISTORY:  Please see the history and physical done at the time of  admission.   FAMILY HISTORY:  Please  see the history and physical done at the time of  admission.   REVIEW OF SYSTEMS:  Please see the history and physical done at the time of  admission.   PHYSICAL EXAMINATION:  Please see the history and physical done at the time  of admission.   HOSPITAL COURSE:  The patient was admitted electively and on December 03, 2002 she was taken to the operating room where she underwent the following  procedure:  Aortic valve replacement with a 21 mm porcine valve.  This was a  Tor[place] stentless valve.  She tolerated the procedure well and was taken to  the surgical intensive care unit.  She also had a transesophageal  echocardiogram pre and postoperatively and this showed good left ventricular  function and post procedure there was no aortic insufficiency noted.  Postoperative hospital course patient has done well.  The patient's  intensive care unit stay was noted to be unremarkable.  She was extubated  without significant difficulty.  She did require aggressive pulmonary toilet  as well as multiple nebulizers and metered dose inhalers during her stay and  showed a gradual improvement and oxygen has been weaned and she is  tolerating activity relatively well on room air.  She has also been seen by  the smoking cessation counselor.  All routine lines, monitors, and drainage  devices were discontinued in a standard fashion.  She required a gentle  diuresis, but has responded well.  She does have a mild postoperative anemia  but hemoglobin and hematocrit are felt to be stable, most recent values  noted on December 06, 2002 to be 11.5 and 33.8, respectively.  She did have  an initial jump in her white blood cell count; however, this has gradually  moved towards normal range and most recent is 14.7 on December 06, 2002 from  a high of 26.0.  The patient is not felt to require Coumadin due to the stentless bioprosthetic valve.  She has had some tachycardia  postoperatively, but no significant  dysrhythmias.  Medications have been  adjusted and her heart rate has come under better control.  Overall, she is  felt to be tentatively stable for discharge in the morning of December 10, 2002 pending morning round reevaluation.   DISCHARGE MEDICATIONS:  1. Cardizem CD 120 mg daily.  2. Aspirin 325 mg daily.  3. Singulair 10  mg at bedtime daily.  4. Advair one puff b.i.d.  5. Combivent two puffs q.6h. or q.i.d.  6. Pulmicort one puff b.i.d.  7. Digoxin 0.25 mg daily.  8. Humibid LA two tablets b.i.d.  9. Lopressor 25 mg b.i.d.  10.      For pain Ultram 50 mg one to two q.6h. as needed.   DISCHARGE INSTRUCTIONS:  The patient received written instructions in regard  to medications, activity, diet, wound care, and follow-up.  Follow-up will  include Vida Roller, M.D. on Tuesday, November 2 at 11:15, Kerin Perna, M.D. Friday, November 5 at 12:30.   CONDITION ON DISCHARGE:  Stable and improving.    FINAL DIAGNOSES:  1. Severe aortic stenosis now status post aortic valve replacement with     porcine stentless Toronto bioprosthetic valve.  2. Presumed colon carcinoma with surgery scheduled post recovery from heart     surgery.  3. Chronic obstructive pulmonary disease.  4. Severe emphysema.  5. Chronic tobacco abuse.  6. Chronic anxiety.  7. Hypertension.  8. Cataract surgery.  9. Previous lumbar laminectomy.  10.      Previous benign breast mass.  11.      Status post cholecystectomy.  12.      Multiple allergies.  13.      Mild postoperative anemia.  14.      Postoperative sinus tachycardia.  15.      History of being a Designer, jewellery.      Rowe Clack, P.A.-C.                    Kerin Perna, M.D.    Sherryll Burger  D:  12/09/2002  T:  12/09/2002  Job:  [id number]   cc:   Vida Roller, M.D.  Fax: [phone]   Ollen Gross. Vernell Morgans, M.D.  [address]  [place]  [place] [address]  Fax: [phone]   Llana Aliment. Malon Kindle., M.D.  [address]  [address]  [place]  [place] [address]  Fax: [phone]

## 2010-07-08 NOTE — Procedures (Signed)
NAME:  [name], [name]                            ACCOUNT NO.:  [id number]   MEDICAL RECORD NO.:  [id number]                   PATIENT TYPE:  PREC   LOCATION:  DAY                                  FACILITY:  [facility]   PHYSICIAN:  Vida Roller, M.D.                DATE OF BIRTH:  08-11-1940   DATE OF PROCEDURE:  DATE OF DISCHARGE:                                  ECHOCARDIOGRAM   TAPE NUMBER:  [id number].   TAPE COUNT:  [id number] through [id number].   This is a 70 year old lady who is preoperative for something (I am not sure  what), who has a history of aortic stenosis.  She has previously had a study  which is not available for review.  The technical quality of the study is  adequate.   M-MODE MEASUREMENTS:  The aorta is 25 mm.   The left atrium is 31 mm.   The septum is 14 mm.   The posterior wall is 14 mm.   The left ventricular diastolic dimension is 32 mm.   The left ventricular systolic dimension is 22 mm.   2-D AND DOPPLER IMAGING:  The left ventricle is hyperdynamic with no obvious  wall motion abnormalities.  Diastolic function was not assessed due to the  severity of the valvular heart disease.  There is preserved left ventricular  function.   The right ventricle is normal size with normal systolic function.   Both the atria appear to be normal size.  There is no evidence of an  atrioseptal defect.   The aortic valve is sclerotic with significantly decreased leaflet motion.  The velocity across the valve is in excess of 4.5 m/sec, which gives a peak  gradient of 93 mmHg, a mean gradient of 60 mmHg, and an estimated valve area  of 0.45 sq cm.  This is critical aortic stenosis.  There is no aortic  insufficiency.   The mitral valve is morphologically unremarkable with mild annular  calcification.  The leaflets move normally.  There is no evidence of  regurgitation or stenosis.   The tricuspid valve is morphologically unremarkable with no stenosis or  regurgitation.   The  pulmonic valve was not well seen.  The ascending aorta is not well seen.   The inferior vena cava appears to be normal size with normal respirophasic  variation.   The pericardial structures appear normal.   Please note that we have contacted Dr. Michaelle Copas office regarding this patient  and asking for an immediate referral for evaluation.                                               Vida Roller, M.D.    JH/MEDQ  D:  10/28/2002  T:  10/28/2002  Job:  [id number]  

## 2010-07-08 NOTE — Cardiovascular Report (Signed)
NAME:  [name], [name]                            ACCOUNT NO.:  [id number]   MEDICAL RECORD NO.:  [id number]                   PATIENT TYPE:  OIB   LOCATION:  2892                                 FACILITY:  [facility]   PHYSICIAN:  Carole Binning, M.D. [facility]         DATE OF BIRTH:  [birth year]   DATE OF PROCEDURE:  11/12/2002  DATE OF DISCHARGE:  11/12/2002                              CARDIAC CATHETERIZATION   PROCEDURE PERFORMED:  Right and left heart catheterization with coronary  angiography, left ventriculography, and aortic root angiography.   INDICATIONS:  [name] is a 70 year old woman with severe aortic stenosis.  She was recently diagnosed with colon cancer.  She was referred for cardiac  catheterization to evaluate for possible need for aortic valve replacement  prior to her colon cancer surgery.   PROCEDURAL NOTE:  An 8-French sheath was placed in the right femoral vein, 7-  French sheath in the right femoral artery.  Right heart catheterization was  performed with a Swan-Ganz catheter.  Coronary angiography was performed  with 6-French JL4 and JR4 catheters.  Left ventriculography and aortic root  angiography were performed with an angled pigtail catheter.  Contrast was  Omnipaque.  There were no complications.   RESULTS:  HEMODYNAMICS:  Right atrial mean pressure 9.  Right ventricular pressure 50/11.  Pulmonary  artery pressure 45/32.  Pulmonary capillary wedge pressure mean 17.  Left  ventricular pressure 188/17.  Aortic pressure 160/84.  The cardiac output by  the thermodilution method is 3.6, cardiac index 2.1.  The mean gradient  across the aortic valve was calculated at 32 mmHg.  The calculated aortic  valve area is 0.6 sq cm.   LEFT VENTRICULOGRAM:  Wall motion is normal.  Ejection fraction estimated at greater than or equal  to 65%.  There is no mitral regurgitation.   AORTIC ROOT ANGIOGRAPHY:  Tricuspid aortic valve with mild calcification of the aortic valve.   There  is no aortic insufficiency.   CORONARY ARTERIOGRAPHY:  (Right dominant)  Left main is normal.   Left anterior descending artery gives rise to a single large diagonal  branch.  The LAD is normal.   Left circumflex gives rise to a normal sized first marginal and a large  second marginal branch.  The left circumflex has a 30% stenosis in the mid  vessel.   Right coronary artery gives rise to a normal sized posterior descending  artery and a small posterolateral branch.  There is a 20% stenosis in the  mid right coronary artery.   IMPRESSION:  1. Moderate pulmonary hypertension with the right and left heart filling     pressures at the upper limits of normal.  2. Significant aortic stenosis which by calculated valve area appears to be     severe, by measured gradient appears to be at least moderate.  3. No significant coronary artery disease identified.   PLAN:  The case will be discussed with Vida Roller, M.D. further.  Would  anticipate likely referral for consideration for aortic valve replacement.                                               Carole Binning, M.D. [facility]    MWP/MEDQ  D:  11/12/2002  T:  11/13/2002  Job:  [id number]   cc:   Dirk Dress. Katrinka Blazing, M.D.  [address]  [place]  [place] [address]  Fax: [phone]   Vida Roller, M.D.  Fax: [phone]   Cath Lab

## 2010-07-08 NOTE — Op Note (Signed)
NAME:  [name], [name]                            ACCOUNT NO.:  [id number]   MEDICAL RECORD NO.:  [id number]                   PATIENT TYPE:  INP   LOCATION:  2310                                 FACILITY:  [facility]   PHYSICIAN:  Bedelia Person, M.D.                     DATE OF BIRTH:  1940/08/30   DATE OF PROCEDURE:  12/03/2002  DATE OF DISCHARGE:                                 OPERATIVE REPORT   PROCEDURE:  Transesophageal echocardiography intraoperatively.   Prior to the surgical procedure it was discussed with the patient if she had  any gastric or esophageal problems that may negate the use of  transesophageal echocardiography intraoperatively.  The patient denied any  symptoms or problems.  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  The patient was induced with  general anesthesia.  The airway was secured with an oroendotracheal tube.  The stomach was suctioned with an orogastric tube and then removed.  The  transesophageal probe was heavily lubricated, placed in a sleeve, the sleeve  was lubricated and then passed down the oropharynx in a smooth motion  without excessive pressure.  At the 40 cm mark the probe was left in the  neutral unflexed position throughout the remainder of the case.  At  completion of the case the probe was removed along with the sleeve intact.  There was no evidence of oral or pharyngeal damage.   The pre-bypass examination revealed the left ventricle to be overall  thickened.  There was good contractility.  No segmental defects were  detected.  The left atrium was normal in size and appearance.  The appendage  was clean.  The mitral valve was essentially normal.  There was slight  knuckling of the anterior leaflet.  Color Doppler revealed no mitral  regurgitant flow.  The aortic valve showed heavy calcification in an overall  sclerotic valve.  The right and left coronary cusps appeared to be opening  with  somewhat limited motion; however, the noncoronary cusp appeared to be  fixed.  Measurement of the valve appeared to be about 20 mm in cross-  sectional diameter.  The right heart exam was essentially normal.  The  patient was placed on cardiopulmonary bypass and underwent an aortic valve  replacement.  At the completion of the bypass run there were a few air  bubbles, which were cleared with the left ventricular vent.  The left  ventricle post bypass was now dynamic in contractility.  There were again no  segmental defects detected, no significant change in the left atrium.  The  interatrial wall was intact.  The mitral valve initially showed a little  mitral regurgitation with high filling pressures.  This quickly resolved  after bypass and the mitral valve exam was essentially unchanged from the  pre-bypass examination.  The aortic valve revealed a stentless three-leaflet  prosthetic valve in place.  The leaflets were opening and closing  appropriately.  Color Doppler revealed no aortic insufficiency.  There were  no paravalvular leaks detected.  The right heart exam was essentially  changed from pre-bypass examination.                                               Bedelia Person, M.D.    LK/MEDQ  D:  12/03/2002  T:  12/04/2002  Job:  [id number]

## 2010-07-08 NOTE — Assessment & Plan Note (Signed)
[facility]                              CARDIOLOGY OFFICE NOTE   JELINA, PAULSEN                         MRN:          [id number]  DATE:11/17/2005                            DOB:          02-18-1941    IDENTIFICATION:  [name] is a 70 year old who comes in for continued  followup.  She is seen routinely by Dr. [name].   She comes in today because of experiencing weak spells.  She denies  syncope, but she says she will have episodes where she just feels very weak,  dizzy.  She has taken her blood pressure at time with these.  She thinks  that it is because she has over medicated.  Her blood pressure has been in  the 100 range.  Note:  Yesterday in Dr. Anthony Sar office, on arrival, her  blood pressure was 100/94, by Dr. Anthony Sar check it was in the 140 range.   Otherwise, breathing is unchanged.   CURRENT MEDICATIONS:  1. Combivent b.i.d.  2. Advair b.i.d.  3. Pulmicort b.i.d.  4. Coenzyme Q10 b.i.d.  5. Benicar 30 nightly.  6. Altace 5 b.i.d.  7. Cardizem 240 q. a.m.  8. Fish oil 1 g b.i.d.  9. Red yeast rice b.i.d.  10.Flax seed oil b.i.d.   PHYSICAL EXAM:  The patient is in no distress.  Blood pressure on arrival 122/80.  While lying 132/80, pulse 86; standing  120/80, pulse 82.  NECK:  No definite bruits.  JVP normal.  LUNGS:  Clear with occasional wheeze.  CARDIAC:  Regular rate and rhythm.  Grade 1/6 to 2/6 systolic murmur heard  best at the base.  ABDOMEN:  Benign.  EXTREMITIES:  Good distal pulses.  No lower extremity edema, 1+ distal  pulses.   A 12-lead EKG, normal sinus rhythm, 88 beats per minute.  Right atrial  abnormality.  Left atrial abnormality.  Nonspecific ST changes.   IMPRESSION:  1. Hypertension.  It has been quite high in the past.  I would like a      documentation of the blood pressures before making any changes.  She      will keep a log of it, especially if she has a spell.  Also, set her up  with an echocardiogram to evaluate end-organ damage from the      hypertension.  2. Aortic valve disease status post aortic valve resection.  Again, will      look at an echo as well.  3. Health care maintenance.  The patient is due to have lower extremity      Dopplers done today.  Will also check back on lipids.   I will setup to see her back again in 1 month's time.            ______________________________  Pricilla Riffle, MD, [facility]     PVR/MedQ  DD:  11/17/2005  DT:  11/19/2005  Job #:  [id number]

## 2010-07-08 NOTE — Discharge Summary (Signed)
NAME:  BETHENNY, LOSEE                            ACCOUNT NO.:  [id number]   MEDICAL RECORD NO.:  [id number]                   PATIENT TYPE:  INP   LOCATION:  5706                                 FACILITY:  [facility]   PHYSICIAN:  Ollen Gross. Vernell Morgans, M.D.              DATE OF BIRTH:  11-11-1940   DATE OF ADMISSION:  04/29/2003  DATE OF DISCHARGE:  05/04/2003                                 DISCHARGE SUMMARY   Ms. Cortina is a 70 year old white female who was noted to have a cecal mass.  She recently also had an aortic valve replacement.  She was taken tot he  operating room on March 9 and underwent a right hemicolectomy which she  tolerated well.   On postop day #1, her NG tube was left in.  She was moved out of bed to the  chair.  On postop day #2, her NG tube was discontinued, and she was started  on clears.  She was also restarted on her cardiac medications.  Her  pathology at that point returned no identified cancer in the specimen.   She continued to improve slowly over the next couple of days, and by March  14, she was tolerating her diet.  She had bowel movement, and she was doing  very well.  Her staples were discontinued and Steri-Strips placed across the  wound, and she was discharged home.   CONDITION ON DISCHARGE:  Stable.   FINAL DIAGNOSIS:  Cecal adenoma.   DISCHARGE MEDICATIONS:  She is to resume home medications.  She was given  prescription for Vicodin for pain.   DIET:  As tolerated.   ACTIVITY:  No heavy lifting.   FOLLOW UP:  Followup with Dr. Carolynne Edouard within the next two weeks.                                                Ollen Gross. Vernell Morgans, M.D.    PST/MEDQ  D:  06/17/2003  T:  06/18/2003  Job:  [id number]

## 2010-07-08 NOTE — Op Note (Signed)
NAME:  PAIDYN, MCFERRAN                            ACCOUNT NO.:  [id number]   MEDICAL RECORD NO.:  [id number]                   PATIENT TYPE:  INP   LOCATION:  2310                                 FACILITY:  [facility]   PHYSICIAN:  Kerin Perna III, M.D.           DATE OF BIRTH:  1940/03/28   DATE OF PROCEDURE:  12/03/2002  DATE OF DISCHARGE:                                 OPERATIVE REPORT   OPERATION:  Aortic valve replacement with a 21 mm porcine valve.   PREOPERATIVE DIAGNOSIS:  Aortic stenosis with class 3 congestive heart  failure.   POSTOPERATIVE DIAGNOSIS:  Aortic stenosis with class 3 congestive heart  failure.   SURGEON:  Kerin Perna, M.D.   ASSISTANT:  Salvatore Decent. Cornelius Moras, M.D.   ANESTHESIA:  General by Dr. Bedelia Person   INDICATIONS FOR PROCEDURE:  The patient is a 70 year old female smoker who  recently underwent cardiology evaluation prior to laparotomy for a cecal  tumor resection.  2D echo and subsequent cardiac catheterization documented  significant aortic stenosis with normal coronaries and good LV systolic  function.  She was felt to be a candidate for aortic valve replacement prior  to undergoing laparotomy and colectomy.  Prior to the operation, I examined  the patient in the office and reviewed the results of her cardiac cath,  echo, and other diagnostic studies.  I discussed with the patient the major  indications and expected benefits of aortic valve replacement for treatment  of her aortic stenosis.  I reviewed with the patient the alternatives to  surgical therapy for treatment of her aortic stenosis.  I discussed with the  patient the major aspects of the proposed operation including the choice of  a bioprosthetic valve if possible, the use of use of general anesthesia and  cardiopulmonary bypass, the location of the surgical incision, and the  expected postoperative hospital recovery.  I discussed with the patient the  risks to her aortic valve  replacement for treatment of her aortic stenosis  including the risks of MI, CVA, bleeding, blood transfusion requirement,  pacemaker, wound infection, prolonged ventilator support, and death.  She  understood her long-standing cigarette smoking and significant emphysema  would present increased risks for postoperative pulmonary problems.  She  understood the implications for the surgery and agreed to proceed with the  operation as planned under what I felt was an informed consent.   OPERATIVE FINDINGS:  The aortic valve had three leaflets.  The leaflets were  thickened and there was calcium in the commissures and in the annulus.  A  stentless 21 mm porcine valve was used.  The valve sizer for a 19 mm St.  Jude valve would not pass through the annulus. The patient was given one  unit of blood during the bypass for a hemoglobin of 7.   PROCEDURE:  The patient was brought to the operating room and  placed supine  on the operating table.  General endotracheal anesthesia was induced and  hemodynamic monitoring.  A transesophageal 2D echo probe was placed by the  anesthesiologist which confirmed the preoperative diagnosis of aortic  stenosis.  No significant aortic insufficiency was seen on echo.  The chest,  abdomen, and legs were prepped with Betadine and draped as a sterile field.  A sternal incision was made and the pericardium was opened.  Heparin was  administered and the ACT was documented as being therapeutic.  Two purse-  strings were placed in the ascending aorta and right atrium and the patient  was cannulated and placed on bypass, and cooled to 32 degrees.  A left  ventricular  vent was placed near the right superior pulmonary vein.  Cardioplegia catheters were placed for both antegrade and retrograde  delivery of cardioplegia through the ascending aorta and right atrium into  the coronary sinus. The patient was then cooled to 30 degrees and the aortic  Cross clamp was applied.  A  total of 800 mL of cold blood cardioplegia was  delivered to the coronaries in split doses between the antegrade aortic and  retrograde coronary sinus catheters.  There was good cardioplegic arrest and  septal temperature dropped to less than 14 degrees.  Topical iced saline  slush was used to augment myocardial preservation and a pericardial  insufflator pad was used to protect the left phrenic nerve.   A transverse aortotomy was made.  The aortic valve was inspected.  It was  stenotic, calcified, and tricuspid.  The valve was excised and the annulus  was debrided of calcium.  The outflow tract was irrigated with copious  amounts of cold saline.  The annulus was sized to a 21 mm stentless valve.  The sinotubular junction was sized to a 21 mm stentless valve.  Subannular  simple sutures of 4-0 Ethibond were then placed around the annulus numbering  19 total.  These were then placed through the sewing ring of the stentless  valve which had been washed and prepared per protocol.  The valve was seated  and the sutures were tied.  The outflow suture line was then constructed  using a running 4-0 Prolene.  Each post was secured to the ascending aorta  at the sinotubular junction with a horizontal mattress suture which was  brought inside out on the aorta and tied over a Teflon pledget.  A running 4-  0 Prolene was then used to attach the outflow portion of the stentless valve  to the ascending aorta.  Under the coronaries, the sutures were sewn in a  running fashion underneath the ostium and care was taken not to impinge or  deform the coronary ostia.  After the outflow suture line had been placed,  the prosthesis was checked and there were no spaces which would lead to a  perivalvular leak.  The valve leaflets were well opposed and there was no  evidence of insufficiency.  The aortotomy was then closed in two layers  using running 4-0 Prolene.  Prior to release of the crossclamp, a layer  of biologic glue was applied to the aortotomy suture line.   A dose of retrograde warm blood cardioplegia was given to remove air from  the coronaries as well as the usual deairing maneuvers.  The aortic Cross  clamp was removed as the aortic vent was left to suction.  The heart resumed  a spontaneous rhythm.  The patient was rewarmed and reperfused.  The LV vent  and cardioplegia catheters were removed.  Temporary pacing wires were  applied.  When the patient was reperfused and had reached 37 degrees, the  ventilator resumed and the lungs were ventilated.  The patient was then  weaned from bypass without difficulty with a stable blood pressure and good  cardiac output.  Protamine was administered without adverse reaction.  The  cannulae were removed.  The aortotomy was hemostatic.  The mediastinum was  irrigated with warm antibiotic irrigation.  The superior pericardial fat was  closed over the aorta and aortotomy.  Two mediastinal chest tubes were  placed and brought out through separate incisions.  The sternum was closed  with interrupted steel wire.  The pectoralis fascia was closed with a  running #1 Vicryl.  The subcutaneous layer and the skin were closed with  running Vicryl and sterile dressings were applied.  Total bypass time was  160 minutes.  The aortic Cross clamp time was 130 minutes.                                               Mikey Bussing, M.D.    PV/MEDQ  D:  12/03/2002  T:  12/03/2002  Job:  [id number]   cc:   Vida Roller, M.D.  Fax: [phone]   Ollen Gross. Vernell Morgans, M.D.  [address]  [place]  [place] [address]  Fax: [phone]

## 2010-07-19 ENCOUNTER — Inpatient Hospital Stay (HOSPITAL_COMMUNITY)
Admission: EM | Admit: 2010-07-19 | Discharge: 2010-07-21 | DRG: 191 | Disposition: A | Discharge status: Home / Self Care | Payer: Medicare Other | Attending: Internal Medicine | Admitting: Internal Medicine

## 2010-07-19 ENCOUNTER — Encounter: Payer: Self-pay | Admitting: Family Medicine

## 2010-07-19 ENCOUNTER — Emergency Department (HOSPITAL_COMMUNITY): Payer: Medicare Other

## 2010-07-19 ENCOUNTER — Ambulatory Visit (INDEPENDENT_AMBULATORY_CARE_PROVIDER_SITE_OTHER): Payer: Medicare Other | Admitting: Family Medicine

## 2010-07-19 VITALS — BP 100/60 | HR 90 | Resp 16 | Wt 92.0 lb

## 2010-07-19 DIAGNOSIS — E0590 Thyrotoxicosis, unspecified without thyrotoxic crisis or storm: Secondary | ICD-10-CM | POA: Diagnosis present

## 2010-07-19 DIAGNOSIS — Z9981 Dependence on supplemental oxygen: Secondary | ICD-10-CM

## 2010-07-19 DIAGNOSIS — N3090 Cystitis, unspecified without hematuria: Secondary | ICD-10-CM

## 2010-07-19 DIAGNOSIS — J441 Chronic obstructive pulmonary disease with (acute) exacerbation: Principal | ICD-10-CM | POA: Diagnosis present

## 2010-07-19 DIAGNOSIS — N390 Urinary tract infection, site not specified: Secondary | ICD-10-CM | POA: Diagnosis present

## 2010-07-19 DIAGNOSIS — J42 Unspecified chronic bronchitis: Secondary | ICD-10-CM

## 2010-07-19 DIAGNOSIS — F05 Delirium due to known physiological condition: Secondary | ICD-10-CM | POA: Diagnosis present

## 2010-07-19 DIAGNOSIS — I10 Essential (primary) hypertension: Secondary | ICD-10-CM | POA: Diagnosis present

## 2010-07-19 DIAGNOSIS — Z954 Presence of other heart-valve replacement: Secondary | ICD-10-CM

## 2010-07-19 LAB — URINALYSIS, ROUTINE W REFLEX MICROSCOPIC
Glucose, UA: NEGATIVE mg/dL
Hgb urine dipstick: NEGATIVE
Protein, ur: NEGATIVE mg/dL
pH: 6.5 (ref 5.0–8.0)

## 2010-07-19 LAB — URINE MICROSCOPIC-ADD ON

## 2010-07-19 LAB — POCT URINALYSIS DIPSTICK
Blood, UA: NEGATIVE
Glucose, UA: NEGATIVE
Ketones, UA: NEGATIVE
Spec Grav, UA: 1.01

## 2010-07-19 LAB — BASIC METABOLIC PANEL
BUN: 9 mg/dL (ref 6–23)
CO2: 34 mEq/L — ABNORMAL HIGH (ref 19–32)
Chloride: 99 mEq/L (ref 96–112)
Creatinine, Ser: 0.75 mg/dL (ref 0.4–1.2)
Glucose, Bld: 88 mg/dL (ref 70–99)
Potassium: 4.1 mEq/L (ref 3.5–5.1)

## 2010-07-19 LAB — CBC
HCT: 38.6 % (ref 36.0–46.0)
Hemoglobin: 12.6 g/dL (ref 12.0–15.0)
MCH: 29.1 pg (ref 26.0–34.0)
MCV: 89.1 fL (ref 78.0–100.0)
Platelets: 318 10*3/uL (ref 150–400)
RBC: 4.33 MIL/uL (ref 3.87–5.11)

## 2010-07-19 LAB — DIFFERENTIAL
Eosinophils Absolute: 0.2 10*3/uL (ref 0.0–0.7)
Lymphocytes Relative: 22 % (ref 12–46)
Lymphs Abs: 1.8 10*3/uL (ref 0.7–4.0)
Monocytes Relative: 10 % (ref 3–12)
Neutrophils Relative %: 66 % (ref 43–77)

## 2010-07-19 NOTE — Patient Instructions (Signed)
F/u in 2 to 3weeks.  You need to go directly to the eD for further  Evaluation, I believe you need hospitalization.  I hope you feel better soon

## 2010-07-19 NOTE — Progress Notes (Signed)
  Subjective:    Patient ID: [name], female    DOB: [birth year], 70 y.o.   MRN: [id number]  HPI Pt brought in today by family members who report increased confusion, with falls and increasingly poor apetite in the past 7 to 10 days. She has experienced chills intermittently in the past 2 days. She has severe COPD with a cough which has worsened. She was recently treated for hyperthyroididsm and is being followed by endo Review of Systems Reports fatigue and increasing dyspnea.  Denies sinus pressure, nasal congestion, ear pain or sore throat. Denies chest congestion, productive cough or wheezing. Denies chest pains, palpitations, paroxysmal nocturnal dyspnea, orthopne and leg swelling Denies abdominal pain, nausea, vomiting,diarrhea or constipation.   Limited mobility due to excessive fatigue. Urinary frequency noted Denies skin break down or rash.        Objective:   Physical Exam Ill appearing malnourished anxious female on oxygen.Anxious and somewhat confused. HEENT: no sinus tenderness, EOMI, no erythema of oropharynx or exudate Chest: decreased air entry, bilateral crackles , and wheezes CVS:S1 and S2, no S3 , systolic murmur Abd: soft, mild suprapubic tenderness Ext: no edema       Assessment & Plan:

## 2010-07-20 DIAGNOSIS — N3000 Acute cystitis without hematuria: Secondary | ICD-10-CM | POA: Insufficient documentation

## 2010-07-20 LAB — GENTAMICIN LEVEL, RANDOM: Gentamicin Rm: 5.2 ug/mL

## 2010-07-20 LAB — TSH: TSH: 0.127 u[IU]/mL — ABNORMAL LOW (ref 0.350–4.500)

## 2010-07-20 NOTE — Assessment & Plan Note (Signed)
Abnormal uA in pt with acute confusion, , this is clearly a contributing factor to her presentation

## 2010-07-20 NOTE — Assessment & Plan Note (Signed)
Deteriorated, pt appears acutely ill, with infection and is a high risk for pneumonia, she is sent to the the ED for further eval

## 2010-07-21 LAB — COMPREHENSIVE METABOLIC PANEL
Albumin: 3.1 g/dL — ABNORMAL LOW (ref 3.5–5.2)
Alkaline Phosphatase: 51 U/L (ref 39–117)
BUN: 11 mg/dL (ref 6–23)
Chloride: 98 mEq/L (ref 96–112)
Creatinine, Ser: 0.69 mg/dL (ref 0.4–1.2)
Glucose, Bld: 81 mg/dL (ref 70–99)
Total Bilirubin: 0.3 mg/dL (ref 0.3–1.2)
Total Protein: 6 g/dL (ref 6.0–8.3)

## 2010-07-21 LAB — BLOOD GAS, ARTERIAL
Acid-Base Excess: 6.3 mmol/L — ABNORMAL HIGH (ref 0.0–2.0)
O2 Saturation: 92.4 %
Patient temperature: 37
TCO2: 28.3 mmol/L (ref 0–100)
pH, Arterial: 7.416 — ABNORMAL HIGH (ref 7.350–7.400)

## 2010-07-21 LAB — DIFFERENTIAL
Eosinophils Absolute: 0.1 10*3/uL (ref 0.0–0.7)
Eosinophils Relative: 1 % (ref 0–5)
Lymphocytes Relative: 24 % (ref 12–46)
Lymphs Abs: 2.4 10*3/uL (ref 0.7–4.0)
Monocytes Absolute: 0.9 10*3/uL (ref 0.1–1.0)
Monocytes Relative: 9 % (ref 3–12)

## 2010-07-21 LAB — URINE CULTURE: Culture  Setup Time: 201205302038

## 2010-07-21 LAB — CBC
HCT: 34 % — ABNORMAL LOW (ref 36.0–46.0)
MCH: 28.9 pg (ref 26.0–34.0)
MCV: 89.5 fL (ref 78.0–100.0)
Platelets: 297 10*3/uL (ref 150–400)
RDW: 13.2 % (ref 11.5–15.5)

## 2010-07-21 NOTE — Group Therapy Note (Signed)
  NAME:  [name], [name]                  ACCOUNT NO.:  [id number]  MEDICAL RECORD NO.:  [id number]           PATIENT TYPE:  I  LOCATION:  A313                          FACILITY:  [facility]  PHYSICIAN:  [name], M.D.DATE OF BIRTH:  1941/01/11  DATE OF PROCEDURE:  07/20/2010 DATE OF DISCHARGE:                                PROGRESS NOTE   HISTORY:  This lady was admitted yesterday with rather curious 3- to 4- day history of being rather confused and the description that the sister, who was at the bedside and who lives with her gives me is really one of delirium.  The admitting physician, Dr. Onalee Hua, felt that she had fever and weakness and indeed the patient apparently did feel hot, but there was no documented fever.  When she was admitted, her temperature was not elevated.  She does have underlying COPD and she has recently been diagnosed with hyperthyroidism and has been started on methimazole by Dr. Fransico Him.  Now, she feels much better today, as if she is almost back to her usual self.  PHYSICAL EXAMINATION:  VITAL SIGNS:  Temperature 97.4, blood pressure 118/68, pulse 105, saturation 96% on room air. GENERAL:  She looks systemically well, is not toxic, and does not have increased work of breathing.  There is no peripheral or central cyanosis. HEART:  Heart sounds are present and she appears to have a baseline resting tachycardia. LUNGS:  Lung fields are clinically clear with some occasional coarse rhonchi, which I think chronic. NEUROLOGICAL:  She does appear to be alert and oriented at the present time and is not delirious now.  LABORATORY DATA:  Investigations on admission showed a white blood cell count of 8.3.  BUN and creatinine in the normal range and urine microscopy was suggesting many bacteria.  Chest x-ray apart from showing COPD did not show any other process such as pneumonia.  IMPRESSION: 1. Acute delirium improved possibly related to urinary tract      infection. 2. Probable urinary tract infection. 3. Underlying chronic obstructive pulmonary disease.  PLAN: 1. Continue with intravenous gentamicin for the time being in view of     her multiple severe allergies with other antibiotics. 2. Possible discharge soon depending on clinical course.     [name], M.D.     NCG/MEDQ  D:  07/20/2010  T:  07/20/2010  Job:  [id number]  Electronically Signed by Lilly Cove M.D. on 07/21/2010 01:35:16 PM

## 2010-07-21 NOTE — Discharge Summary (Signed)
NAMEKIKUE, GERHART                  ACCOUNT NO.:  [id number]  MEDICAL RECORD NO.:  [id number]           PATIENT TYPE:  I  LOCATION:  A313                          FACILITY:  [facility]  PHYSICIAN:  [name], M.D.DATE OF BIRTH:  1940-09-10  DATE OF ADMISSION:  07/19/2010 DATE OF DISCHARGE:  LH                              DISCHARGE SUMMARY   FINAL DISCHARGE DIAGNOSES: 1. Acute confusional state of unclear etiology. 2. Probable urinary tract infection. 3. Chronic obstructive pulmonary disease.  CONDITION ON DISCHARGE:  Stable.  MEDICATIONS ON DISCHARGE:  She will continue all home medications including: 1. Advair Diskus 250/50 one spray daily. 2. Claritin 10 mg daily. 3. Diltiazem CD 240 mg daily. 4. Fish oil 2 tablets daily. 5. Fluticasone nasal spray 2 sprays b.i.d. as needed. 6. Lorazepam 0.5 mg nightly. 7. Methimazole 5 mg daily. 8. Protonix 40 mg daily. 9. Propranolol 20 mg b.i.d. 10.Spiriva inhaler 18 mcg daily. 11.Trilipix 135 mg nightly. 12.Vitamin E 1 tablet daily.  CONDITION ON DISCHARGE:  Stable.  HISTORY:  This very pleasant 70 year old lady was admitted with fever and weakness but also gave a history of acute confusional state which she gave a description of delirium 3-4 days prior to hospitalization. She also had a history of dysuria.  Please see initial history and physical examination done by Dr. Tarry Kos.  HOSPITAL PROGRESS:  The patient actually improved fairly quickly once she was hospitalized.  Due to her several severe anaphylactic-type reactions to several antibiotics including PENICILLINS, LEVAQUIN, she was started on intravenous gentamicin.  She improved rather rapidly even for gentamicin to reach a steady state, so it is not entirely clear the source of her acute confusional state.  She swears that she continues to have 2 liters of oxygen per nasal cannula on most of the waking hours. Chest x-ray on admission did not show any evidence of  pneumonia but only findings of COPD.  She did not have really any focal neurological signs to warrant a CT brain scan.  It is interesting that she was started on methimazole by Dr. Fransico Him recently and this episode occurred after this. In any case, she seems to be back to her usual self now.  PHYSICAL EXAMINATION:  VITAL SIGNS:  Temperature 98.1, blood pressure 110/72, pulse 73, saturation 100% on 2 liters of oxygen. GENERAL:  She is alert and oriented. CARDIAC:  Heart sounds are present and normal without murmurs. CHEST:  Lungs fields clear with some scattered rhonchi. NEUROLOGIC:  There are no focal neurological signs.  INVESTIGATIONS:  This morning show sodium of 137, potassium 3.7, bicarbonate 34, BUN 11, creatinine 0.69.  Liver enzymes all in the normal range.  Albumin 3.1.  Hemoglobin 11.0, white blood cell count 10.1, platelets 297.  Arterial blood gas done on room air shows a pH of 7.416, pCO2 slightly elevated at 49, pO2 significantly reduced at 66 with a saturation of 92.4% on room air.  TSH remained suppressed at 0.127, and I suspect it will take time for this to normalize with the methimazole.  Urine culture so far has not come back, but the  patient is clinically much improved on intravenous gentamicin.  DISPOSITION:  I think the patient is stable to be discharged and I will followup on the urine cultures to see what organism is growing and if she needs any further treatment.  She has had a good 48-60 hours of intravenous antibiotics, and this appears to be sufficient for the time- being.  I have instructed her to follow up with her primary care physician in the next week or so.     [name], M.D.     NCG/MEDQ  D:  07/21/2010  T:  07/21/2010  Job:  [id number]  cc:   Milus Mallick. [name], M.D. Fax: [phone]  Electronically Signed by Lilly Cove M.D. on 07/21/2010 01:35:30 PM

## 2010-07-22 ENCOUNTER — Emergency Department (HOSPITAL_COMMUNITY)
Admission: EM | Admit: 2010-07-22 | Discharge: 2010-07-22 | Disposition: A | Discharge status: Home / Self Care | Payer: Medicare Other | Attending: Emergency Medicine | Admitting: Emergency Medicine

## 2010-07-22 ENCOUNTER — Emergency Department (HOSPITAL_COMMUNITY): Payer: Medicare Other

## 2010-07-22 DIAGNOSIS — J449 Chronic obstructive pulmonary disease, unspecified: Secondary | ICD-10-CM | POA: Insufficient documentation

## 2010-07-22 DIAGNOSIS — Z79899 Other long term (current) drug therapy: Secondary | ICD-10-CM | POA: Insufficient documentation

## 2010-07-22 DIAGNOSIS — R109 Unspecified abdominal pain: Secondary | ICD-10-CM | POA: Insufficient documentation

## 2010-07-22 DIAGNOSIS — J4489 Other specified chronic obstructive pulmonary disease: Secondary | ICD-10-CM | POA: Insufficient documentation

## 2010-07-22 DIAGNOSIS — N390 Urinary tract infection, site not specified: Secondary | ICD-10-CM | POA: Insufficient documentation

## 2010-07-22 LAB — CBC
HCT: 38.8 % (ref 36.0–46.0)
Hemoglobin: 12.5 g/dL (ref 12.0–15.0)
MCH: 28.9 pg (ref 26.0–34.0)
MCHC: 32.2 g/dL (ref 30.0–36.0)
MCV: 89.6 fL (ref 78.0–100.0)
RDW: 13.2 % (ref 11.5–15.5)

## 2010-07-22 LAB — BASIC METABOLIC PANEL
BUN: 10 mg/dL (ref 6–23)
CO2: 37 mEq/L — ABNORMAL HIGH (ref 19–32)
Calcium: 10.8 mg/dL — ABNORMAL HIGH (ref 8.4–10.5)
Creatinine, Ser: 0.85 mg/dL (ref 0.4–1.2)
GFR calc non Af Amer: >60 mL/min (ref >60)
Glucose, Bld: 105 mg/dL — ABNORMAL HIGH (ref 70–99)

## 2010-07-22 LAB — DIFFERENTIAL
Basophils Absolute: 0 10*3/uL (ref 0.0–0.1)
Eosinophils Relative: 1 % (ref 0–5)
Lymphocytes Relative: 20 % (ref 12–46)
Monocytes Absolute: 0.9 10*3/uL (ref 0.1–1.0)
Monocytes Relative: 9 % (ref 3–12)
Neutro Abs: 7.5 10*3/uL (ref 1.7–7.7)

## 2010-07-22 LAB — URINE CULTURE

## 2010-07-22 LAB — URINALYSIS, ROUTINE W REFLEX MICROSCOPIC
Bilirubin Urine: NEGATIVE
Hgb urine dipstick: NEGATIVE
Ketones, ur: NEGATIVE mg/dL
Nitrite: NEGATIVE
Protein, ur: NEGATIVE mg/dL
Specific Gravity, Urine: <1.005 — ABNORMAL LOW (ref 1.005–1.030)
Urobilinogen, UA: 0.2 mg/dL (ref 0.0–1.0)

## 2010-07-22 LAB — URINE MICROSCOPIC-ADD ON

## 2010-07-22 NOTE — H&P (Signed)
NAMEKIRSTAN, [name]                  ACCOUNT NO.:  [id number]  MEDICAL RECORD NO.:  [id number]           PATIENT TYPE:  I  LOCATION:  A313                          FACILITY:  [facility]  PHYSICIAN:  Tarry Kos, MD       DATE OF BIRTH:  October 28, 1940  DATE OF ADMISSION:  07/19/2010 DATE OF DISCHARGE:  LH                             HISTORY & PHYSICAL   CHIEF COMPLAINT:  Fever, weakness.  HISTORY OF PRESENT ILLNESS:  Ms. 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  She states she has been having some chills at home.  She denies any nausea, vomiting.  She has been wheezing worse than normal and she denies any abdominal pain or chest pain.  REVIEW OF SYSTEMS:  Otherwise negative.  PAST MEDICAL HISTORY: 1. COPD with 2 L nasal cannula continuous oxygen treatment at home. 2. Recently diagnosed with thyroid problems by her primary care     physician.  She does not know if it was too high or too low. 3. Status post right hemicolectomy. 4. 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 5. Hypertension. 6. Status post appendectomy. 7. Status post laminectomy. 8. Status post cholecystectomy. 9. Status post cataract surgery. 10.Status post aortic valve replacement with bioprosthesis in 2004. 11.Dyslipidemia. 12.Nonobstructive coronary artery disease.  ALLERGIES:  PENICILLIN causes rash and swelling.  LEVAQUIN, she says made her blood pressure go too low, but she did not have any rash or swelling with Levaquin.  SULFA DRUGS, she does not remember because this was when she was a child.  Also listed is FLEXERIL, HYDROCODONE, VIOXX, WELLBUTRIN.  MEDICATIONS:  A list was provided by her sister, I am not sure if this is Coreg per emergency room notes  and the patient does not really know. Advair Diskus, albuterol, aspirin, benzonatate, calcium carbonate, Trilipix, diltiazem, Flonase, Claritin, Ativan, Cozaar, Tapazole, prednisone, Spiriva, vitamin E, Protonix, Inderal.  SOCIAL HISTORY:  She is still smoking about half pack a day.  Denies any alcohol.  No IV drug abuse.  She does live with her sister.  One of her son is with her right now.  PHYSICAL EXAMINATION:  VITALS:  Temperature 97.4, blood pressure 156/80, pulse 101, respiration 20, 95% O2 sats on 2 L nasal cannula. GENERAL:  She is alert, she has some difficulty breathing with full sentences, but she is able to speak in full sentences.  She has got good color. HEENT:  Extraocular muscles are intact.  Pupils are equal, reactive to light.  Oropharynx clear.  Mucous membranes moist. NECK:  No JVD.  No carotid bruits. CARDIAC:  Regular rate and rhythm without murmurs, rubs, or gallops. CHEST:  She is diminished throughout with some expiratory wheezing at the bases, otherwise fair air movement.  No rhonchi, rales, or crackles. ABDOMEN:  Soft, nontender, nondistended.  Positive bowel sounds.  No hepatosplenomegaly.  EXTREMITIES:  No clubbing, cyanosis, or edema. PSYCH:  Normal mood and affect. NEUROLOGIC:  No focal neurologic deficits.  LABORATORY DATA:  Her CBC is normal.  Chest x-ray shows no infiltrate or evidence of heart failure.  Electrolytes are normal.  Urinalysis is moderate leukocytes, many bacteria, 11 to 20 white blood cells.  ASSESSMENT/PLAN:  This is a 70 year old female with urinary tract infection and chronic obstructive pulmonary disease exacerbation. 1. Urinary tract infection.  We will send off for cultures.  She has     got gentamicin in the emergency room, we are going to continue     this.  She has got multiple antibiotic allergies.  We will try to     get records from her PCP to see what antibiotic she has tolerated     in the past. 2. Chronic obstructive  pulmonary disease exacerbation.  We will only     start her on prednisone for now 20 mg twice a day.  She has got     some very mild expiratory wheezing and place her frequent nebulizer     treatments. 3. History of aortic valve replacement, this is stable. 4. Hypertension, stable. 5. Clarify home medications and resume. 6. Thyroid issues.  We will also check TSH.  Further recommendations     pending her hospital course.                                           ______________________________ Tarry Kos, MD     RD/MEDQ  D:  07/19/2010  T:  07/20/2010  Job:  [id number]  Electronically Signed by Tarry Kos MD on 07/22/2010 11:53:12 AM

## 2010-07-29 ENCOUNTER — Emergency Department (HOSPITAL_COMMUNITY)
Admission: EM | Admit: 2010-07-29 | Discharge: 2010-07-30 | Disposition: A | Discharge status: Home / Self Care | Payer: Medicare Other | Attending: Emergency Medicine | Admitting: Emergency Medicine

## 2010-07-29 DIAGNOSIS — J4489 Other specified chronic obstructive pulmonary disease: Secondary | ICD-10-CM | POA: Insufficient documentation

## 2010-07-29 DIAGNOSIS — R5381 Other malaise: Secondary | ICD-10-CM | POA: Insufficient documentation

## 2010-07-29 DIAGNOSIS — Z79899 Other long term (current) drug therapy: Secondary | ICD-10-CM | POA: Insufficient documentation

## 2010-07-29 DIAGNOSIS — F17200 Nicotine dependence, unspecified, uncomplicated: Secondary | ICD-10-CM | POA: Insufficient documentation

## 2010-07-29 DIAGNOSIS — R300 Dysuria: Secondary | ICD-10-CM | POA: Insufficient documentation

## 2010-07-29 DIAGNOSIS — J449 Chronic obstructive pulmonary disease, unspecified: Secondary | ICD-10-CM | POA: Insufficient documentation

## 2010-07-30 ENCOUNTER — Emergency Department (HOSPITAL_COMMUNITY): Payer: Medicare Other

## 2010-07-30 LAB — DIFFERENTIAL
Basophils Absolute: 0 10*3/uL (ref 0.0–0.1)
Basophils Relative: 0 % (ref 0–1)
Eosinophils Absolute: 0 10*3/uL (ref 0.0–0.7)
Monocytes Absolute: 0.7 10*3/uL (ref 0.1–1.0)
Monocytes Relative: 6 % (ref 3–12)
Neutrophils Relative %: 82 % — ABNORMAL HIGH (ref 43–77)

## 2010-07-30 LAB — COMPREHENSIVE METABOLIC PANEL
AST: 15 U/L (ref 0–37)
Albumin: 3.5 g/dL (ref 3.5–5.2)
Calcium: 10.4 mg/dL (ref 8.4–10.5)
Chloride: 95 mEq/L — ABNORMAL LOW (ref 96–112)
Creatinine, Ser: 0.8 mg/dL (ref 0.4–1.2)
Total Bilirubin: 0.2 mg/dL — ABNORMAL LOW (ref 0.3–1.2)

## 2010-07-30 LAB — URINE MICROSCOPIC-ADD ON

## 2010-07-30 LAB — CK TOTAL AND CKMB (NOT AT ARMC)
CK, MB: 2.4 ng/mL (ref 0.3–4.0)
Total CK: 34 U/L (ref 7–177)

## 2010-07-30 LAB — TROPONIN I: Troponin I: <0.3 ng/mL (ref <0.30)

## 2010-07-30 LAB — URINALYSIS, ROUTINE W REFLEX MICROSCOPIC
Bilirubin Urine: NEGATIVE
Hgb urine dipstick: NEGATIVE
Specific Gravity, Urine: <1.005 — ABNORMAL LOW (ref 1.005–1.030)
Urobilinogen, UA: 0.2 mg/dL (ref 0.0–1.0)
pH: 6 (ref 5.0–8.0)

## 2010-07-30 LAB — CBC
MCH: 29.9 pg (ref 26.0–34.0)
MCHC: 33.3 g/dL (ref 30.0–36.0)
Platelets: 392 10*3/uL (ref 150–400)

## 2010-07-31 LAB — URINE CULTURE: Culture: NO GROWTH

## 2010-08-01 ENCOUNTER — Ambulatory Visit (INDEPENDENT_AMBULATORY_CARE_PROVIDER_SITE_OTHER): Payer: Medicare Other | Admitting: Family Medicine

## 2010-08-01 ENCOUNTER — Inpatient Hospital Stay (HOSPITAL_COMMUNITY)
Admission: AD | Admit: 2010-08-01 | Discharge: 2010-08-05 | DRG: 690 | Disposition: A | Discharge status: Home Health | Payer: Medicare Other | Source: Ambulatory Visit | Attending: Internal Medicine | Admitting: Internal Medicine

## 2010-08-01 ENCOUNTER — Encounter: Payer: Self-pay | Admitting: Family Medicine

## 2010-08-01 ENCOUNTER — Telehealth: Payer: Self-pay | Admitting: Family Medicine

## 2010-08-01 ENCOUNTER — Other Ambulatory Visit: Payer: Self-pay | Admitting: Family Medicine

## 2010-08-01 ENCOUNTER — Inpatient Hospital Stay (HOSPITAL_COMMUNITY): Payer: Medicare Other

## 2010-08-01 VITALS — BP 110/80 | HR 113 | Temp 98.5°F | Resp 18 | Ht 62.0 in | Wt 93.0 lb

## 2010-08-01 DIAGNOSIS — R000 Tachycardia, unspecified: Secondary | ICD-10-CM | POA: Diagnosis present

## 2010-08-01 DIAGNOSIS — R509 Fever, unspecified: Secondary | ICD-10-CM

## 2010-08-01 DIAGNOSIS — Z681 Body mass index (BMI) 19 or less, adult: Secondary | ICD-10-CM

## 2010-08-01 DIAGNOSIS — J4489 Other specified chronic obstructive pulmonary disease: Secondary | ICD-10-CM | POA: Diagnosis present

## 2010-08-01 DIAGNOSIS — Z9981 Dependence on supplemental oxygen: Secondary | ICD-10-CM

## 2010-08-01 DIAGNOSIS — D72829 Elevated white blood cell count, unspecified: Secondary | ICD-10-CM

## 2010-08-01 DIAGNOSIS — I10 Essential (primary) hypertension: Secondary | ICD-10-CM | POA: Diagnosis present

## 2010-08-01 DIAGNOSIS — Z954 Presence of other heart-valve replacement: Secondary | ICD-10-CM

## 2010-08-01 DIAGNOSIS — R627 Adult failure to thrive: Secondary | ICD-10-CM | POA: Diagnosis present

## 2010-08-01 DIAGNOSIS — F17200 Nicotine dependence, unspecified, uncomplicated: Secondary | ICD-10-CM | POA: Diagnosis present

## 2010-08-01 DIAGNOSIS — N390 Urinary tract infection, site not specified: Principal | ICD-10-CM | POA: Diagnosis present

## 2010-08-01 DIAGNOSIS — E0590 Thyrotoxicosis, unspecified without thyrotoxic crisis or storm: Secondary | ICD-10-CM | POA: Diagnosis present

## 2010-08-01 DIAGNOSIS — J449 Chronic obstructive pulmonary disease, unspecified: Secondary | ICD-10-CM | POA: Diagnosis present

## 2010-08-01 LAB — CBC WITH DIFFERENTIAL/PLATELET
Basophils Absolute: 0 10*3/uL (ref 0.0–0.1)
Eosinophils Relative: 1 % (ref 0–5)
Lymphocytes Relative: 10 % — ABNORMAL LOW (ref 12–46)
Neutro Abs: 15.4 10*3/uL — ABNORMAL HIGH (ref 1.7–7.7)
Platelets: 422 10*3/uL — ABNORMAL HIGH (ref 150–400)
RDW: 14.1 % (ref 11.5–15.5)
WBC: 18.3 10*3/uL — ABNORMAL HIGH (ref 4.0–10.5)

## 2010-08-01 LAB — BASIC METABOLIC PANEL
Chloride: 100 mEq/L (ref 96–112)
Creat: 0.61 mg/dL (ref 0.50–1.10)
Potassium: 4.7 mEq/L (ref 3.5–5.3)

## 2010-08-01 NOTE — Patient Instructions (Addendum)
F/u in 4 weeks.  cBC and diff, and chem 7 stat today.  Pls seriously consider short term skilled nursing placement to aid your recovery  Home health services  will eval you at home if you remain there

## 2010-08-01 NOTE — Progress Notes (Signed)
  Subjective:    Patient ID: [name], female    DOB: 07/08/1940, 70 y.o.   MRN: [id number]  HPI Pt reports that she initially felt  Better after , hospitaliztion. Now she has had intermittent chills, felt she would die on Friday, went to the ED, blood and urine cultures have been negative, she has had 2 ed visits since recent hospitalization. Of note , her wbc was mildly elevated with a shift on Friday and she was told she was dehydrated. Labs will be repeated today for f/u on these in light of her continued malaise , chills etc Of note, pt has documented  Proteus uTI, not sensitive to nitrofurantoin, but sensitve to many organisms, most of which she reports being allergic to except amikacin.     Review of Systems  Chronic chest congestion,and  cough with ongoing nicotine use Denies chest pains, palpitations,  and leg swelling Denies abdominal pain, nausea, vomiting,diarrhea or constipation.  Denies rectal bleeding or change in bowel movement. Denies dysuria, or frequency, recently treated for UTI gneralized weakness and  limitation in mobility. Denies headaches, seizure, numbness, or tingling. Increased anxiety due to ill health. Denies skin break down or rash.        Objective:   Physical Exam Patient alert and oriented and in no Cardiopulmonary distress.Ill appearing  HEENT: No facial asymmetry, EOMI, no sinus tenderness, TM's clear, Oropharynx pink and moist.  Neck decreased ROM no adenopathy.  Chest:decreased air entry scattered crackles and wheezes CVS: S1, S2 no murmurs, no S3.  ABD: Soft non tender. Bowel sounds normal.  Ext: No edema  MS: decreased  ROM spine, shoulders, hips and knees.  Skin: Intact, no ulcerations or rash noted.  Psych: Good eye contact, normal affect. Memory intact not anxious or depressed appearing.  CNS: CN 2-12 intact,   decreased power and tone in throughout    Assessment & Plan:

## 2010-08-01 NOTE — Assessment & Plan Note (Signed)
Deteriorated , continues to smoke, counseled to quit

## 2010-08-01 NOTE — Assessment & Plan Note (Signed)
Untreated infection, causing symptoms and rising WBC

## 2010-08-01 NOTE — Telephone Encounter (Signed)
noted 

## 2010-08-01 NOTE — Assessment & Plan Note (Signed)
Deteriorated, wbc up to 18.3 and absolute granulocyte count 15.4, these are worse than 3 days ago when pt was in the ED. On full review of her chart, she is being treated with an antibiotic for UTI, which the bacteria  is resistant to and this is the likely culprit. Based on her stated allergies, amikacin is the drug which I see most readily that she would respond to, I have discussed with the hospitalist and am waiting on a return call, I believe she need to be hospitalized based on symptoms and rapidly rising WBC. I have spoken with pt and sister, explained the situation and they are waiting on my return call.

## 2010-08-01 NOTE — Telephone Encounter (Signed)
Someone cancelled she is coming in at 10:45

## 2010-08-01 NOTE — Assessment & Plan Note (Signed)
Controlled, no change in medication  

## 2010-08-02 ENCOUNTER — Inpatient Hospital Stay (HOSPITAL_COMMUNITY): Payer: Medicare Other

## 2010-08-02 ENCOUNTER — Encounter (HOSPITAL_COMMUNITY): Payer: Self-pay | Admitting: Radiology

## 2010-08-02 DIAGNOSIS — I359 Nonrheumatic aortic valve disorder, unspecified: Secondary | ICD-10-CM

## 2010-08-02 LAB — URINALYSIS, ROUTINE W REFLEX MICROSCOPIC
Bilirubin Urine: NEGATIVE
Hgb urine dipstick: NEGATIVE
Ketones, ur: NEGATIVE mg/dL
Nitrite: NEGATIVE
pH: 7.5 (ref 5.0–8.0)

## 2010-08-02 LAB — CBC
HCT: 36.1 % (ref 36.0–46.0)
Hemoglobin: 11.9 g/dL — ABNORMAL LOW (ref 12.0–15.0)
WBC: 20.1 10*3/uL — ABNORMAL HIGH (ref 4.0–10.5)

## 2010-08-02 LAB — T4, FREE: Free T4: 1.01 ng/dL (ref 0.80–1.80)

## 2010-08-02 LAB — DIFFERENTIAL
Basophils Absolute: 0.1 10*3/uL (ref 0.0–0.1)
Lymphocytes Relative: 9 % — ABNORMAL LOW (ref 12–46)
Neutro Abs: 17 10*3/uL — ABNORMAL HIGH (ref 1.7–7.7)
Neutrophils Relative %: 84 % — ABNORMAL HIGH (ref 43–77)

## 2010-08-02 LAB — COMPREHENSIVE METABOLIC PANEL
ALT: 8 U/L (ref 0–35)
Albumin: 3.3 g/dL — ABNORMAL LOW (ref 3.5–5.2)
Alkaline Phosphatase: 59 U/L (ref 39–117)
BUN: 7 mg/dL (ref 6–23)
Chloride: 99 mEq/L (ref 96–112)
GFR calc Af Amer: >60 mL/min (ref >60)
Glucose, Bld: 95 mg/dL (ref 70–99)
Potassium: 4.4 mEq/L (ref 3.5–5.1)
Sodium: 136 mEq/L (ref 135–145)
Total Bilirubin: 0.4 mg/dL (ref 0.3–1.2)

## 2010-08-02 LAB — LIPASE, BLOOD: Lipase: 21 U/L (ref 11–59)

## 2010-08-02 MED ORDER — IOHEXOL 300 MG/ML  SOLN
100.0000 mL | Freq: Once | INTRAMUSCULAR | Status: AC | PRN
  Administered 2010-08-02: 100 mL via INTRAVENOUS

## 2010-08-02 NOTE — H&P (Signed)
[name], [name]                  ACCOUNT NO.:  [id number]  MEDICAL RECORD NO.:  [id number]  LOCATION:  A302                          FACILITY:  [facility]  PHYSICIAN:  Osvaldo Shipper, MD     DATE OF BIRTH:  [birth year]  DATE OF ADMISSION:  08/01/2010 DATE OF DISCHARGE:  LH                             HISTORY & PHYSICAL   PRIMARY CARE PHYSICIAN:  Milus Mallick. Simpson, MD  The patient was admitted from May 30 to May 31 for an urinary tract infection and confusion.  The patient has been referred for admission directly from Dr. Anthony Sar office for recurrent UTI.  ADMISSION DIAGNOSES: 1. Generalized weakness and anorexia. 2. Untreated/recurrent urinary tract infection. 3. Tachycardia, possibly related to hyperthyroidism. 4. Recent diagnosis of hyperthyroidism. 5. History of chronic obstructive pulmonary disease, on chronic home     oxygenation  CHIEF COMPLAINT:  Weakness.  HISTORY OF PRESENT ILLNESS:  The patient is a 70 year old Caucasian female who lives by herself who was admitted back in May for 2-3 days stay for confusion and UTI.  She was treated with gentamicin due to her multiple drug allergies.  It appears that she was not really discharged home on any antibiotics at that time.  She was given intravenous gentamicin while she was in the hospital.  The patient presented back into the ED on June 1.  She was prescribed nitrofurantoin at that time and was asked to go back to see her primary care physician.  She presented again on June 9 with complaints of weakness and at that time again she was sent home from the ED and asked to follow up with her primary care physician.  She went to her doctor's office today with above-mentioned complaints.  Apparently had blood work.  I do not have the results of those with me but Dr. Sherrie Mustache with whom Dr. [name] spoke was told that the blood count was 15,000 and so the patient was referred for direct admission.  She tells me that for the  last couple of weeks, she has been extremely weak, has had anorexia.  Denies any nausea, vomiting, had some left abdominal pain which has resolved.  Denies any diarrhea.  Has been having some chills.  Has a chronic cough, chronic shortness of breath nothing more than usual, but main complaint has been the weakness.  She also mentions weight loss of about 15-20 pounds over the last 6 months or so and she tells me that Dr. [name] feels that the reason for weight loss is probably hyperthyroidism for which she was started on methimazole recently.  So history is not great from this patient and that is what we have for now.  MEDICATIONS AT HOME:  She gave me a list from ED visit recently. 1. Advair Diskus, albuterol, nebulizer which she does twice a day. 2. Aspirin 81 mg daily. 3. Benzonatate 100 mg 3 times a day. 4. Calcium carbonate unknown frequency. 5. Trilipix 135 mg daily. 6. Diltiazem CD 240 mg daily. 7. Claritin 10 mg daily. 8. Lorazepam 0.5 mg as needed. 9. Losartan 25 mg daily. 10.Methimazole 5 mg daily. 11.Fish oil daily. 12.Prednisone taper which I think has  1 day left. 13.Spiriva HandiHaler. 14.Vitamin E. 15.Protonix 40 mg daily. 16.Inderal 10 mg b.i.d.  Based on the previous discharge summary was     20 mg b.i.d.  ALLERGIES:  She is allergic to numerous medications including antibiotics and these as per e-chart are the following; PENICILLIN, VICODIN, LATEX, SULFA, ZOCOR, LEVAQUIN, FLEXERIL, CELEBREX THIMEROSAL, VIOXX, WELLBUTRIN and ZETIA.  PAST MEDICAL HISTORY:  Positive for COPD, on chronic home oxygenation. History of hyperthyroidism recently diagnosed and is on methimazole once a day which I feel is may be a little bit inadequate.  She is status post right hemicolectomy.  She had a large tubulovillous adenoma. Hypertension is present.  She has had aortic valve replacement.  She has had appendectomy, laminectomy, cholecystectomy, cataract surgery, nonobstructive  CAD is also present, dyslipidemia history is present. Her EF was normal in 2011.  SOCIAL HISTORY:  Lives in [place] by herself.  She is cutting down on her smoking, still smokes half pack of cigarettes on a daily basis.  No alcohol use.  No illicit drug use.  Uses a walker to ambulate.  She states she has been extremely weak in the last few weeks.  FAMILY HISTORY:  Unremarkable per the patient.  REVIEW OF SYSTEMS:  GENERAL:  Positive for weakness, malaise, weight loss.  HEENT:  Unremarkable.  CARDIOVASCULAR:  Unremarkable. RESPIRATORY:  Positive for chronic cough and shortness of breath.  GI: Unremarkable.  GU:  Denied any dysuria or hematuria recently, but did have some during her previous hospitalization.  Other systems reviewed and found to be negative.  PHYSICAL EXAMINATION:  VITAL SIGNS:  Temperature 98.7, heart rate 116, respiratory rate 20, blood pressure 116/55, saturation 99% on 2 L. GENERAL:  Thin white female in no distress, slightly anxious. HEENT:  Head is normocephalic, atraumatic.  Pupils are equal and reacting.  No pallor.  No icterus.  Oral mucous membrane is moist.  No oral lesions are noted. NECK:  Soft and supple.  No thyromegaly is appreciated.  No cervical, supraclavicular, or inguinal lymphadenopathy is present.  LUNGS: Crackles in bilateral bases without any wheezing actually at this time. CARDIOVASCULAR:  S1 and S2 is tachycardic, regular.  No S3, S4, rubs, murmurs or bruits.  No pedal edema.  No JVD.  ABDOMEN:  Soft, nontender, nondistended.  Bowel sounds are present.  No masses or organomegaly is appreciated. GU:  Deferred. MUSCULOSKELETAL:  Normal muscle mass and tone. NEUROLOGIC:  She is alert, anxious, oriented x3.  No focal neurological deficits are present.  LABORATORY DATA:  No labs available yet so far.  She did have some blood work in her doctor's office.  Unfortunately, I am not able to access them at this point and the patient did not bring  in any paperwork.  ASSESSMENT:  This is a 70 year old Caucasian female presents with weakness, weight loss.  She was diagnosed with an urinary tract infection a couple of weeks ago.  Her white count apparently was 15,000. I think we maybe dealing with an untreated urinary tract infection or an recurrent urinary tract infection.  We will recheck labs here, recheck UA also.  Tachycardia and weight loss may be contributed by her hyperthyroidism.  PLAN: 1. Possible recurrent UTI.  We will check urine analysis, urine     cultures.  We will put her on IV gentamicin for now.  We will check     a renal ultrasound to make sure there is nothing else going on in     her renal system.  2. Hyperthyroidism.  We will increase the dose of her methimazole to 5     mg t.i.d.  Recheck her thyroid function tests for now. 3. History of COPD.  Continue with nebulizer treatments, Advair.  We     will give Atrovent nebulized while she is in the hospital and we     will also check a chest x-ray to make sure there is no other source     for her weakness. 4. History of hypertension.  Continue with antihypertensive agents. 5. She is a full code. 6. Tobacco abuse.  Counseling was provided. 7. DVT prophylaxis will be initiated.  Further management decisions will depend on results of further testing and patient's response to treatments.  Physical therapy consultation will also be obtained for her generalized debility.     Osvaldo Shipper, MD     GK/MEDQ  D:  08/01/2010  T:  08/01/2010  Job:  [id number]  cc:   Milus Mallick. [name], M.D. Fax: [phone]  Electronically Signed by Osvaldo Shipper MD on 08/01/2010 11:45:43 PM

## 2010-08-03 LAB — BASIC METABOLIC PANEL
BUN: 9 mg/dL (ref 6–23)
CO2: 31 mEq/L (ref 19–32)
Calcium: 9.8 mg/dL (ref 8.4–10.5)
Chloride: 103 mEq/L (ref 96–112)
Creatinine, Ser: 0.64 mg/dL (ref 0.4–1.2)
Glucose, Bld: 67 mg/dL — ABNORMAL LOW (ref 70–99)

## 2010-08-03 LAB — DIFFERENTIAL
Eosinophils Absolute: 0.1 10*3/uL (ref 0.0–0.7)
Lymphocytes Relative: 11 % — ABNORMAL LOW (ref 12–46)
Lymphs Abs: 1.3 10*3/uL (ref 0.7–4.0)
Monocytes Relative: 8 % (ref 3–12)
Neutro Abs: 9.4 10*3/uL — ABNORMAL HIGH (ref 1.7–7.7)
Neutrophils Relative %: 80 % — ABNORMAL HIGH (ref 43–77)

## 2010-08-03 LAB — CBC
HCT: 31.9 % — ABNORMAL LOW (ref 36.0–46.0)
Hemoglobin: 10.4 g/dL — ABNORMAL LOW (ref 12.0–15.0)
MCH: 29.5 pg (ref 26.0–34.0)
MCV: 90.6 fL (ref 78.0–100.0)
RBC: 3.52 MIL/uL — ABNORMAL LOW (ref 3.87–5.11)
WBC: 11.8 10*3/uL — ABNORMAL HIGH (ref 4.0–10.5)

## 2010-08-03 LAB — PATHOLOGIST SMEAR REVIEW

## 2010-08-03 LAB — URINE CULTURE: Colony Count: NO GROWTH

## 2010-08-03 NOTE — Group Therapy Note (Signed)
  NAME:  [name], [name]                  ACCOUNT NO.:  [id number]  MEDICAL RECORD NO.:  [id number]  LOCATION:  A302                          FACILITY:  [facility]  PHYSICIAN:  [name], M.D.DATE OF BIRTH:  04/20/1940  DATE OF PROCEDURE:  08/03/2010 DATE OF DISCHARGE:                                PROGRESS NOTE   HISTORY:  This lady was admitted again a couple of days ago with weakness and anorexia, and on admission she was afebrile and not hypotensive.  She did have another urine culture done on June 9, when I presume she was seen emergency room and this was negative for growth.  A urine culture done on May 30, was also negative.  A urine culture done at Dr. Anthony Sar office on May 29, apparently grew Proteus.  It is unclear to me whether this lady is having repeated urinary tract infections.  Certainly on this occasion the urinalysis was rather unremarkable with a small amount of leukocytes and urine microscopy was really unremarkable.  In any case, she has been treated with intravenous gentamicin again and she feels better, so it is likely that she may well aware urine infection or untreated one.  PHYSICAL EXAMINATION:  VITAL SIGNS:  Temperature 98.1, blood pressure 101/62, pulse 80, and saturation 96% on 2 liters oxygen. GENERAL:  She looks systemically well and is not septic. HEART:  Sounds are present and normal. LUNG FIELDS:  Clinically clear but air entry is reduced on both sides.  INVESTIGATIONS:  Sodium 139, potassium 3.4, bicarbonate 31, BUN 9, creatinine 0.64, hemoglobin 10.4, white blood cell count 11.8, platelets 342.  Echocardiogram was also done on this visit yesterday which shows systolic function was hyperdynamic with an ejection fraction of 70-75%.  IMPRESSION: 1. Possible recurrent urinary tract infection. 2. Tachycardia may be related to hyperthyroidism. 3. Chronic obstructive pulmonary disease, stable.  PLAN: 1. Reduce Cardizem. 2. Increase  propranolol. 3. Continue intravenous gentamicin.     [name], M.D.     NCG/MEDQ  D:  08/03/2010  T:  08/03/2010  Job:  [id number]  Electronically Signed by Lilly Cove M.D. on 08/03/2010 10:27:25 PM

## 2010-08-04 ENCOUNTER — Telehealth: Payer: Self-pay | Admitting: Family Medicine

## 2010-08-04 LAB — DIFFERENTIAL
Basophils Absolute: 0 10*3/uL (ref 0.0–0.1)
Lymphocytes Relative: 15 % (ref 12–46)
Lymphs Abs: 1.2 10*3/uL (ref 0.7–4.0)
Monocytes Absolute: 0.8 10*3/uL (ref 0.1–1.0)
Neutro Abs: 5.5 10*3/uL (ref 1.7–7.7)

## 2010-08-04 LAB — CULTURE, BLOOD (ROUTINE X 2): Culture: NO GROWTH

## 2010-08-04 LAB — CBC
HCT: 31.3 % — ABNORMAL LOW (ref 36.0–46.0)
Hemoglobin: 10.1 g/dL — ABNORMAL LOW (ref 12.0–15.0)
MCHC: 32.3 g/dL (ref 30.0–36.0)
MCV: 91 fL (ref 78.0–100.0)

## 2010-08-04 LAB — COMPREHENSIVE METABOLIC PANEL
Alkaline Phosphatase: 56 U/L (ref 39–117)
BUN: 11 mg/dL (ref 6–23)
Chloride: 104 mEq/L (ref 96–112)
GFR calc Af Amer: >60 mL/min (ref >60)
Glucose, Bld: 81 mg/dL (ref 70–99)
Potassium: 3.9 mEq/L (ref 3.5–5.1)
Total Bilirubin: 0.2 mg/dL — ABNORMAL LOW (ref 0.3–1.2)

## 2010-08-04 LAB — SEDIMENTATION RATE: Sed Rate: 28 mm/hr — ABNORMAL HIGH (ref 0–22)

## 2010-08-04 NOTE — Group Therapy Note (Signed)
  NAME:  [name], [name] NO.:  [id number]  MEDICAL RECORD NO.:  [id number]  LOCATION:                                 FACILITY:  PHYSICIAN:  [name], M.D.DATE OF BIRTH:  11-12-1940  DATE OF PROCEDURE: DATE OF DISCHARGE:                                PROGRESS NOTE   This lady is feeling much improved and actually wishes to go home, but she is willing to abide by medical decisions.  She has no urinary symptoms.  She has no cough, shortness of breath.  PHYSICAL EXAMINATION:  VITAL SIGNS:  Temperature 97.9, blood pressure 115/66, pulse improved at 63, saturation 98% on 2 L oxygen. GENERAL:  She looks systemically well.  She is not toxic. CARDIAC:  Heart sounds are present and normal. CHEST:  Lung fields are clear with some coarse rhonchi which I am sure are chronic. NEUROLOGIC:  She is alert and orientated.  INVESTIGATIONS:  ESR only 28.  Sodium 140, potassium 3.9, bicarbonate 33, BUN 11, creatinine 0.7, albumin 2.7.  Hemoglobin 10.1, white blood cell count down to 7.6 now, platelets 337.  Echocardiogram was done a couple of days ago and this was within normal limits with ejection fraction 70-75%.  Again, urine culture on this admission is negative for any growth. Also, blood cultures are negative.  IMPRESSION: 1. Possible recurrent urinary tract infection. 2. Hyperthyroidism with tachycardia now improved with increased dose     of propranolol. 3. Hypertension. 4. Chronic obstructive pulmonary disease.  PLAN: 1. Discontinue IV fluids. 2. Discontinue telemetry. 3. Reduce Cozaar to 12.5 mg daily. 4. Decrease methimazole to 5 mg a day.  I have done this after     discussion with Dr. Fransico Him who remembers this patient and tells me     that her hyperthyroidism was fairly marginal.  We will check a TSH     again today. 5. Continue with intravenous gentamicin.     [name], M.D.     NCG/MEDQ  D:  08/04/2010  T:  08/04/2010  Job:   [id number]  Electronically Signed by Lilly Cove M.D. on 08/04/2010 12:13:01 PM

## 2010-08-04 NOTE — Telephone Encounter (Signed)
Spoke directly with pt since the number  Provided di not answer. Ms Bleau states she never called, she feels much better and believes she is strong enough to go home. IO advised that she discuss this frankly with her family, and once they have decided they need to speak with the doc tomorrow who will be discharging her, Of note I had this discussion with them before she was hospitalized and no commitment was made

## 2010-08-05 LAB — BASIC METABOLIC PANEL WITH GFR
BUN: 11 mg/dL (ref 6–23)
CO2: 32 meq/L (ref 19–32)
Calcium: 10.1 mg/dL (ref 8.4–10.5)
Chloride: 102 meq/L (ref 96–112)
Creatinine, Ser: 0.75 mg/dL (ref 0.50–1.10)
GFR calc Af Amer: >60 mL/min
GFR calc non Af Amer: >60 mL/min
Glucose, Bld: 89 mg/dL (ref 70–99)
Potassium: 3.4 meq/L — ABNORMAL LOW (ref 3.5–5.1)
Sodium: 139 meq/L (ref 135–145)

## 2010-08-07 LAB — CULTURE, BLOOD (ROUTINE X 2)

## 2010-08-07 NOTE — Discharge Summary (Signed)
[name], [name]                  ACCOUNT NO.:  [id number]  MEDICAL RECORD NO.:  [id number]  LOCATION:  A302                          FACILITY:  [facility]  PHYSICIAN:  [name], M.D.DATE OF BIRTH:  1941-02-03  DATE OF ADMISSION:  08/01/2010 DATE OF DISCHARGE:  06/15/2012LH                              DISCHARGE SUMMARY   FINAL DISCHARGE DIAGNOSES: 1. Possible recurrent urinary tract infection. 2. Hyperthyroidism 3. Chronic obstructive pulmonary disease.  CONDITION ON DISCHARGE:  Stable.  MEDICATIONS ON DISCHARGE: 1. Gentamicin 60 mg twice a day for further 7 days. 2. Cozaar 12.5 mg daily. 3. Diltiazem CD 180 mg daily. 4. Propanol 20 mg t.i.d. 5. Advair Diskus 250/50 one daily. 6. Aspirin 81 mg daily. 7. Calcium carbonate plus vitamin D 1 tablet daily. 8. Claritin 10 mg daily. 9. Over-the-counter fish oil 1 tablet b.i.d. 10.Flonase nasal spray 2 sprays b.i.d. p.r.n. 11.Lorazepam 0.5 mg nightly p.r.n. 12.Methimazole 5 mg daily. 13.Protonix 40 mg daily. 14.Spiriva inhaler 18 mcg one daily. 15.Trilipix 135 mg nightly. 16.Vitamin D 1 tablet daily.  HISTORY:  This is a very pleasant 70 year old lady was admitted once again with generalized weakness.  She also had tachycardia on admission. Please see initial history and physical examination done by Dr. Osvaldo Shipper.  HOSPITAL PROGRESS:  The patient was felt to have recurrent urinary tract infections.  She was treated with intravenous gentamicin and her medications throughout her hospital stay were adjusted.  In view of her tachycardia which may be related to her infection versus hyperthyroidism, her propranolol dose was increased.  To compensate for this, her Cardizem will dose was decreased.  She seemed to improve with this adjustment and repeated urine cultures in the hospital have been negative.  Specifically on June 9, there was no growth and also June 12, there is no growth.  On discussing with her primary care  physician Dr. [name], the patient did grow Proteus species on Jul 19, 2010.  Blood cultures on June 9 and June 12 all have been negative.  There was a question of whether she was capable of looking after herself at home.  A physical therapy evaluation was done and physical therapy felt that she did not require therapy in the skilled nursing facility.  She did have probably require PT at home and also safety evaluation.  She was also seen by nutrition and suggested using Ensure twice a day.  Her weight on this admission was 40.9 kg.  Previous weight on May 29 was 41.1 kg. There has not been any significant weight loss.  She did have an ultrasound of her kidneys done which was a normal study and she underwent a CT of the abdomen and pelvis which showed no evidence of gastric mass and there were stable.  Pulmonary nodules at the right lung base.  The recommendation was to follow up in 12 months with a noncontrast CT chest scan.  There was mild intrahepatic and extrahepatic biliary ductal dilatation likely related to prior cholecystectomy. Today, she feels very well and is very keen to go home.  She does not wish to go to a nursing home as all the wishes of family members.  PHYSICAL EXAMINATION:  VITAL SIGNS:  Temperature 97.9, blood pressure 105/64, pulse 70, and saturation 99% on 2 liters oxygen. HEART:  Sounds are present and normal. LUNG FIELDS:  Scattered wheezing which is a chronic state.  She does not have increased work of breathing. ABDOMEN:  Soft and nontender. NEUROLOGIC:  She is alert and oriented without any focal neurologic signs.  INVESTIGATIONS:  CBC yesterday showed a hemoglobin of 10.1, white blood cell count 7.6, and platelets 337.  Electrolytes today show sodium of 139, potassium 3.4 which will be repleted, bicarbonate 32, BUN 11, creatinine 0.75.  Her albumin measured yesterday was low at 2.7.  Her free T4 done this admission was 1.01.  TSH is still pending.   Of interest, her TSH measured on May 31 was 0.113.  DISPOSITION:  The patient I believe is stable to be discharged home, but in view of the history of possible recurrent urinary tract infections, I have electing to send the patient home with a further 1-week course of intravenous gentamicin.  I have discussed this with Dr. [name], who agrees and we will also have her have home health physical therapy and advanced home care following her at home.  I have asked the patient to follow up with her primary care physician Dr. [name] in the next 1-2 weeks.     [name], M.D.     NCG/MEDQ  D:  08/05/2010  T:  08/05/2010  Job:  [id number]  cc:   Milus Mallick. [name], M.D. Fax: [phone]  Electronically Signed by Lilly Cove M.D. on 08/07/2010 07:11:03 AM

## 2010-08-09 ENCOUNTER — Ambulatory Visit: Payer: Medicare Other | Admitting: Family Medicine

## 2010-08-12 ENCOUNTER — Telehealth: Payer: Self-pay | Admitting: Family Medicine

## 2010-08-12 ENCOUNTER — Telehealth: Payer: Self-pay

## 2010-08-12 NOTE — Telephone Encounter (Signed)
Advised h/h nurse okay to remove if course is completed

## 2010-08-12 NOTE — Telephone Encounter (Signed)
Call cell at [phone] or office

## 2010-08-12 NOTE — Telephone Encounter (Signed)
H/H nurse aware okay to removed IV after completion of antibiotic

## 2010-08-12 NOTE — Telephone Encounter (Signed)
Once the course is completed which is today as orderd, from in the hos[pital antibiotics stop so stop antibiotic today on completeion if 7 day course

## 2010-08-15 ENCOUNTER — Encounter: Payer: Self-pay | Admitting: Family Medicine

## 2010-08-15 ENCOUNTER — Inpatient Hospital Stay (HOSPITAL_COMMUNITY): Payer: Medicare Other

## 2010-08-15 ENCOUNTER — Ambulatory Visit (INDEPENDENT_AMBULATORY_CARE_PROVIDER_SITE_OTHER): Payer: Medicare Other | Admitting: Family Medicine

## 2010-08-15 ENCOUNTER — Observation Stay (HOSPITAL_COMMUNITY)
Admission: EM | Admit: 2010-08-15 | Discharge: 2010-08-17 | Disposition: A | Discharge status: Home Health | Payer: Medicare Other | Source: Ambulatory Visit | Attending: Internal Medicine | Admitting: Internal Medicine

## 2010-08-15 ENCOUNTER — Emergency Department (HOSPITAL_COMMUNITY): Payer: Medicare Other

## 2010-08-15 VITALS — BP 100/70 | HR 100 | Resp 20 | Wt 88.4 lb

## 2010-08-15 DIAGNOSIS — R5381 Other malaise: Secondary | ICD-10-CM

## 2010-08-15 DIAGNOSIS — R5383 Other fatigue: Secondary | ICD-10-CM | POA: Insufficient documentation

## 2010-08-15 DIAGNOSIS — R627 Adult failure to thrive: Secondary | ICD-10-CM | POA: Insufficient documentation

## 2010-08-15 DIAGNOSIS — N3090 Cystitis, unspecified without hematuria: Secondary | ICD-10-CM

## 2010-08-15 DIAGNOSIS — N390 Urinary tract infection, site not specified: Secondary | ICD-10-CM | POA: Insufficient documentation

## 2010-08-15 DIAGNOSIS — M7710 Lateral epicondylitis, unspecified elbow: Secondary | ICD-10-CM | POA: Insufficient documentation

## 2010-08-15 DIAGNOSIS — J449 Chronic obstructive pulmonary disease, unspecified: Secondary | ICD-10-CM | POA: Insufficient documentation

## 2010-08-15 DIAGNOSIS — R4182 Altered mental status, unspecified: Secondary | ICD-10-CM | POA: Insufficient documentation

## 2010-08-15 DIAGNOSIS — Z79899 Other long term (current) drug therapy: Secondary | ICD-10-CM | POA: Insufficient documentation

## 2010-08-15 DIAGNOSIS — J4489 Other specified chronic obstructive pulmonary disease: Secondary | ICD-10-CM

## 2010-08-15 DIAGNOSIS — R911 Solitary pulmonary nodule: Secondary | ICD-10-CM | POA: Insufficient documentation

## 2010-08-15 DIAGNOSIS — R059 Cough, unspecified: Secondary | ICD-10-CM | POA: Insufficient documentation

## 2010-08-15 DIAGNOSIS — E079 Disorder of thyroid, unspecified: Secondary | ICD-10-CM

## 2010-08-15 DIAGNOSIS — R531 Weakness: Secondary | ICD-10-CM

## 2010-08-15 DIAGNOSIS — Z9981 Dependence on supplemental oxygen: Secondary | ICD-10-CM | POA: Insufficient documentation

## 2010-08-15 DIAGNOSIS — R0602 Shortness of breath: Secondary | ICD-10-CM | POA: Insufficient documentation

## 2010-08-15 DIAGNOSIS — R05 Cough: Secondary | ICD-10-CM | POA: Insufficient documentation

## 2010-08-15 DIAGNOSIS — J9610 Chronic respiratory failure, unspecified whether with hypoxia or hypercapnia: Secondary | ICD-10-CM | POA: Insufficient documentation

## 2010-08-15 DIAGNOSIS — F329 Major depressive disorder, single episode, unspecified: Secondary | ICD-10-CM

## 2010-08-15 DIAGNOSIS — F3289 Other specified depressive episodes: Secondary | ICD-10-CM

## 2010-08-15 DIAGNOSIS — E0590 Thyrotoxicosis, unspecified without thyrotoxic crisis or storm: Secondary | ICD-10-CM | POA: Insufficient documentation

## 2010-08-15 LAB — URINE MICROSCOPIC-ADD ON

## 2010-08-15 LAB — CBC
MCH: 30 pg (ref 26.0–34.0)
MCHC: 33.6 g/dL (ref 30.0–36.0)
MCV: 89.2 fL (ref 78.0–100.0)
Platelets: 508 10*3/uL — ABNORMAL HIGH (ref 150–400)
RDW: 13.3 % (ref 11.5–15.5)
WBC: 10 10*3/uL (ref 4.0–10.5)

## 2010-08-15 LAB — COMPREHENSIVE METABOLIC PANEL
AST: 17 U/L (ref 0–37)
Albumin: 3.6 g/dL (ref 3.5–5.2)
BUN: 8 mg/dL (ref 6–23)
Calcium: 10.7 mg/dL — ABNORMAL HIGH (ref 8.4–10.5)
Creatinine, Ser: 0.84 mg/dL (ref 0.50–1.10)

## 2010-08-15 LAB — URINALYSIS, ROUTINE W REFLEX MICROSCOPIC
Glucose, UA: NEGATIVE mg/dL
Nitrite: NEGATIVE
Protein, ur: NEGATIVE mg/dL

## 2010-08-15 LAB — DIFFERENTIAL
Eosinophils Absolute: 0.2 10*3/uL (ref 0.0–0.7)
Eosinophils Relative: 2 % (ref 0–5)
Lymphs Abs: 1.5 10*3/uL (ref 0.7–4.0)
Monocytes Absolute: 0.8 10*3/uL (ref 0.1–1.0)
Monocytes Relative: 8 % (ref 3–12)

## 2010-08-15 MED ORDER — IOHEXOL 300 MG/ML  SOLN
80.0000 mL | Freq: Once | INTRAMUSCULAR | Status: AC | PRN
  Administered 2010-08-15: 80 mL via INTRAVENOUS

## 2010-08-15 NOTE — Patient Instructions (Signed)
You need to go directly to the Ed for evaluation prior to readmission.  Following this hospitaliztion, as we discussed, you absolutely need to go to a skilled nursing facility until you are better, I will speak to the hospitalist also

## 2010-08-16 ENCOUNTER — Inpatient Hospital Stay (HOSPITAL_COMMUNITY): Payer: Medicare Other

## 2010-08-16 LAB — BLOOD GAS, ARTERIAL
Acid-Base Excess: 2.4 mmol/L — ABNORMAL HIGH (ref 0.0–2.0)
O2 Saturation: 90.7 %
Patient temperature: 37
TCO2: 25 mmol/L (ref 0–100)
pCO2 arterial: 46.4 mmHg — ABNORMAL HIGH (ref 35.0–45.0)

## 2010-08-16 LAB — CBC
HCT: 34 % — ABNORMAL LOW (ref 36.0–46.0)
Hemoglobin: 11.1 g/dL — ABNORMAL LOW (ref 12.0–15.0)
MCH: 29.8 pg (ref 26.0–34.0)
MCHC: 32.6 g/dL (ref 30.0–36.0)
RBC: 3.73 MIL/uL — ABNORMAL LOW (ref 3.87–5.11)

## 2010-08-16 LAB — URINE CULTURE: Culture: NO GROWTH

## 2010-08-16 LAB — COMPREHENSIVE METABOLIC PANEL
ALT: 9 U/L (ref 0–35)
Alkaline Phosphatase: 61 U/L (ref 39–117)
BUN: 5 mg/dL — ABNORMAL LOW (ref 6–23)
CO2: 33 mEq/L — ABNORMAL HIGH (ref 19–32)
GFR calc Af Amer: >60 mL/min (ref >60)
GFR calc non Af Amer: >60 mL/min (ref >60)
Glucose, Bld: 90 mg/dL (ref 70–99)
Potassium: 4.2 mEq/L (ref 3.5–5.1)
Sodium: 139 mEq/L (ref 135–145)
Total Protein: 6.1 g/dL (ref 6.0–8.3)

## 2010-08-16 LAB — DIFFERENTIAL
Basophils Absolute: 0.1 10*3/uL (ref 0.0–0.1)
Lymphocytes Relative: 21 % (ref 12–46)
Monocytes Absolute: 0.5 10*3/uL (ref 0.1–1.0)
Monocytes Relative: 9 % (ref 3–12)
Neutro Abs: 4 10*3/uL (ref 1.7–7.7)
Neutrophils Relative %: 66 % (ref 43–77)

## 2010-08-16 LAB — TSH: TSH: 0.705 u[IU]/mL (ref 0.350–4.500)

## 2010-08-16 LAB — SEDIMENTATION RATE: Sed Rate: 6 mm/hr (ref 0–22)

## 2010-08-16 MED ORDER — GADOBENATE DIMEGLUMINE 529 MG/ML IV SOLN: 7.0000 mL | Freq: Once | INTRAVENOUS | Status: AC | PRN

## 2010-08-16 NOTE — H&P (Signed)
NAMEROBERTINE, [name]                  ACCOUNT NO.:  [id number]  MEDICAL RECORD NO.:  [id number]  LOCATION:  [facility]                          FACILITY:  [facility]  PHYSICIAN:  [name], M.D.DATE OF BIRTH:  09/07/1940  DATE OF ADMISSION:  08/15/2010 DATE OF DISCHARGE:  LH                             HISTORY & PHYSICAL   CHIEF COMPLAINT:  Failure to thrive.  HISTORY OF PRESENT ILLNESS:  This 70 year old lady is admitted once again, this being her third admission in the last month or so.  Previous two admissions have focused on recurrent urinary tract infections but on this occasion after she was discharged home on August 05, 2010 she was doing well for the next 1-2 days but then deteriorated again and became lethargic at home, has not had a good appetite, has had some nausea and vomiting.  She was discharged on 7 further days of intravenous gentamicin and so would have finished of gentamicin only approximately 3 days ago.  Please see previous history and physical examination records previously.  PAST MEDICAL HISTORY: 1. COPD. 2. Hyperthyroidism, recently diagnosed. 3. History of recurrent UTI. 4. Depression.  MEDICATIONS: 1. Cozaar 12.5 mg daily. 2. Propranolol 20 mg t.i.d. 3. Diltiazem CD 180 mg daily. 4. Advair Diskus inhaler 250/50 one daily. 5. Protonix 40 mg daily. 6. Spiriva inhaler one daily. 7. Trilipix 135 mg at bedtime. 8. Vitamin D 1 tablet daily over-the-counter. 9. Fish oil 1 tablet b.i.d. 10.Lorazepam 0.5 mg at bedtime p.r.n. 11.Methimazole 5 mg daily. 12.Protonix 40 mg daily.  ALLERGIES:  Multiple allergies including: 1. FLEXERIL. 2. HYDROCODONE. 3. LEVAQUIN. 4. PENICILLIN. 5. SULFA. 6. VIOXX. 7. WELLBUTRIN.  SOCIAL HISTORY:  She lives alone and there is a sister-in-law who lives nearby.  Another sister lives 20 miles away.  There are two sons who have healthcare power of attorney but they are not able to see her on a regular basis.  She apparently is  an ongoing smoker but I am not sure how much she has smoked in the last week or so.  REVIEW OF SYSTEMS:  Presently, she is somewhat drowsy but has no other complaints.  She apparently has lost 5 pounds since the last time she was hospitalized.  PHYSICAL EXAMINATION:  VITAL SIGNS:  Temperature 98.4, blood pressure 115/62, pulse 112 and sinus rhythm, respiratory rate 16, and saturation 97%. GENERAL:  She does look drowsy but systemically well, otherwise there are no specific changes I can see from her last admission. HEART:  Sounds are present and normal without murmurs. LUNGS:  Lung fields are clear without any wheezing. ABDOMEN:  Soft and nontender without any hepatosplenomegaly. NEUROLOGIC:  She is slightly drowsy but arousable and there are no obvious focal neurological signs.  LABORATORY INVESTIGATIONS:  Urine shows large leukocyte esterase positive and there apparently many bacteria.  Hemoglobin 12.2, white blood cell count 10, and platelets 508.  Sodium 135, potassium 4.0, bicarbonate 32, BUN 8, and creatinine 0.84.  Chest x-ray shows no active disease.  PROBLEM LIST: 1. Failure to thrive. 2. Possible recurrent urinary tract infection. 3. Chronic obstructive pulmonary disease. 4. Hyperthyroidism, although this is mild. 5. History of several pulmonary nodules  on CT scan, January 2012.  PLAN: 1. Admit. 2. Urine for culture. 3. PT and OT evaluation with possibility of discharge to the skilled     nursing facility. 4. CT scan of the chest to evaluate those pulmonary nodules in case     this is malignancy, although they were apparently stable from     previous CT scan.     [name], M.D.     NCG/MEDQ  D:  08/15/2010  T:  08/15/2010  Job:  [id number]  cc:   Milus Mallick. [name], M.D. Fax: [phone]  Electronically Signed by Lilly Cove M.D. on 08/16/2010 02:26:01 PM

## 2010-08-16 NOTE — Group Therapy Note (Signed)
  NAMEELANOR, [name]                  ACCOUNT NO.:  [id number]  MEDICAL RECORD NO.:  [id number]  LOCATION:                                 FACILITY:  PHYSICIAN:  [name], M.D.DATE OF BIRTH:  17-May-1940  DATE OF PROCEDURE:  08/16/2010 DATE OF DISCHARGE:                                PROGRESS NOTE   This lady appears to be somewhat stable and more alert today.  She did have a CT scan of her chest yesterday because I was concerned about pulmonary nodules and indeed there does appear to be a worrisome pulmonary nodule measuring 6 x 8 mm in the right apex demonstrating spiculation.  PHYSICAL EXAMINATION:  VITAL SIGNS:  Today, temperature 97.7, blood pressure 120/58, pulse 81, saturation 95% on room air. GENERAL:  She does look systemically well and is not toxic. CHEST:  The air entry is reduced in both lung fields with just a few scattered crackles which are likely to be chronic.  INVESTIGATIONS:  ESR is only 6.  Sodium 139, potassium 4.2, bicarbonate 33, BUN 5, creatinine 0.76, albumin is 3.0.  Hemoglobin 11.1, white blood cell count 6.2, platelets 471.  Incidentally, her weight is 41.1 kg today which is largely unchanged from the previous times that she has been here.  IMPRESSION: 1. Failure to thrive with poor nutrition. 2. Altered mental status per description of family members. 3. Right upper lobe pulmonary nodule. 4. Chronic obstructive pulmonary disease. 5. Ex-smoker.  PLAN: 1. Pulmonary consultation to see whether this nodule is worrisome that     needs to be biopsied via bronchoscopy 2. MRI of the brain. 3. Check arterial blood gas in case this is a cause of her confusion     at times.     [name], M.D.    NCG/MEDQ  D:  08/16/2010  T:  08/16/2010  Job:  [id number]  Electronically Signed by Lilly Cove M.D. on 08/16/2010 02:26:09 PM

## 2010-08-17 ENCOUNTER — Ambulatory Visit: Payer: Medicare Other | Admitting: Family Medicine

## 2010-08-17 NOTE — Assessment & Plan Note (Signed)
Recently treated aggressively for uti, will need re-eval of this based on continued deterioration and symptoms, ED eval and admission for skilled facility placement. Case discussed with Ed doc and hospitalist

## 2010-08-17 NOTE — Assessment & Plan Note (Signed)
deteriorated 

## 2010-08-17 NOTE — Assessment & Plan Note (Signed)
Currently being treated by endo, dr Fransico Him

## 2010-08-17 NOTE — Assessment & Plan Note (Signed)
Deteriorated due to deterioration in health

## 2010-08-17 NOTE — Progress Notes (Signed)
  Subjective:    Patient ID: Suezanne Jacquet, female    DOB: [birth year], 70 y.o.   MRN: [id number]  HPI Pt returns 3 days after completion of a 1 day course of IV antibiotics for UTI, c/o extremely poor apetite, faigue , weight loss, and high fall risk, she is accompanied by her family. She has been staying on her own with h/h nurse. Now believes she needs to go a skilled facility before she can return home, still c/o intermittent chills   Review of Systems Denies sinus pressure, nasal congestion, ear pain or sore throat. Chronic chest congestion,non productive smoker's cough and wheezing. Denies chest pains, orthopnea and leg swelling Chronic , nausea,no diarrhea or constipation.  .  Reports depression and anxiety, states "I do not want to die alone". Denies skin break down or rash.        Objective:   Physical Exam Patient alert , anxious and ill appearing. HEENT: No facial asymmetry, EOMI, no sinus tenderness,   Neck supple no adenopathy.  Chest: Clear to auscultation bilaterally.decreased air entry throughout scattered basilar crackles, no wheezes  CVS: S1, S2 no murmurs, no S3.  ABD: Soft non tender. Ext: No edema  MS: decreased  ROM spine, shoulders, hips and knees.  Skin: Intact, no ulcerations or rash noted.  Psych: Good eye contact,flat affect.  anxious and  depressed appearing.        Assessment & Plan:

## 2010-08-19 DIAGNOSIS — I10 Essential (primary) hypertension: Secondary | ICD-10-CM

## 2010-08-19 DIAGNOSIS — A499 Bacterial infection, unspecified: Secondary | ICD-10-CM

## 2010-08-19 DIAGNOSIS — J449 Chronic obstructive pulmonary disease, unspecified: Secondary | ICD-10-CM

## 2010-08-24 NOTE — Consult Note (Signed)
  NAMEORIYAH, LAMPHEAR                  ACCOUNT NO.:  [id number]  MEDICAL RECORD NO.:  [id number]  LOCATION:  A320                          FACILITY:  [facility]  PHYSICIAN:  Klever Twyford L. Juanetta Gosling, M.D.DATE OF BIRTH:  August 08, 1940  DATE OF CONSULTATION:  08/16/2010 DATE OF DISCHARGE:                                CONSULTATION   REASON FOR CONSULTATION:  Abnormal chest CT.  HISTORY:  Ms. Louth is a 70 year old who has had multiple recent hospitalizations.  She has had recurrent urinary tract infections.  She had been hospitalized and was discharged on 15th of this month, went home, did fairly well, but then developed increasing problems with lethargy, poor appetite, nausea, vomiting, and failure to thrive.  She has a recent diagnosis of hyperthyroidism, has COPD on chronic home O2. She has had a right hemicolectomy for a tubulovillous adenoma.  She has hypertension.  She has had an aortic valve replacement, appendectomy, laminectomy, cholecystectomy, cataract surgeries, nonobstructive coronary artery occlusive disease, and hyperlipidemia.  SOCIAL HISTORY:  She lives alone.  She smokes about half pack of cigarettes daily.  She does not use any alcohol.  She does not use any illicit drugs.  FAMILY HISTORY:  Positive for coronary artery disease and apparently COPD.  REVIEW OF SYSTEMS:  She has a chronic cough.  She has been very weak and she feels like she has lost weight, perhaps related to her hyperthyroidism.  PHYSICAL EXAMINATION:  GENERAL:  A thin female who does not appear to be in any acute distress now.  She is awake and alert. VITAL SIGNS:  Her temperature is 98.4, blood pressure 115/62, pulse 112, respirations 16, and oxygen saturation 97%. HEENT:  Pupils are reactive.  Nose and throat are clear.  Mucous membranes are moist. CHEST:  Clear lung fields. HEART:  Regular without murmur, gallop, or rub. ABDOMEN:  Soft without masses.  Urine shows what appears to be a urinary tract  infection.  Her BUN is 8 and creatinine 0.84.  Chest x-ray shows no active disease but her CT chest shows that she has pulmonary nodules that were noted previously on CT of the chest in January of this year.  There are multiple bilateral pulmonary nodules.  The largest of these is in the right apex and it is 6-8 mm in diameter.  It apparently demonstrate some spiculation.  The other nodules are less than 3 mm and all are stable.  ASSESSMENT:  She has stable pulmonary nodules, but the pattern of the nodules is worrisome, particularly in someone who has had a potential carcinoma.  These are not large enough to view by PET scan, however, and I think we will simply have to follow this using CT.  I would repeat CT probably in about 3 months at this point.  Otherwise, I would continue with her treatments which we have outlined including oxygen, etc.     Odis Wickey L. Juanetta Gosling, M.D.     ELH/MEDQ  D:  08/16/2010  T:  08/17/2010  Job:  [id number]  Electronically Signed by Kari Baars M.D. on 08/24/2010 08:56:12 AM

## 2010-08-24 NOTE — Group Therapy Note (Signed)
  NAMERAVINDER, HOFLAND                  ACCOUNT NO.:  [id number]  MEDICAL RECORD NO.:  [id number]  LOCATION:  A320                          FACILITY:  [facility]  PHYSICIAN:  Arin Peral L. Juanetta Gosling, M.D.DATE OF BIRTH:  28-Nov-1940  DATE OF PROCEDURE:  08/16/2010 DATE OF DISCHARGE:                                PROGRESS NOTE   Ms. Tramontana, I believe is being discharged today.  She does have pulmonary nodules and they need to be followed.  I would suggest that she have another CT in about 3 months.  They are too small to be seen on PET scan.  I have discussed that with her daughter-in-law who is present in the room and she understands the plan.     Veera Stapleton L. Juanetta Gosling, M.D.     ELH/MEDQ  D:  08/17/2010  T:  08/17/2010  Job:  [id number]  Electronically Signed by Kari Baars M.D. on 08/24/2010 08:56:17 AM

## 2010-08-25 ENCOUNTER — Telehealth: Payer: Self-pay | Admitting: Family Medicine

## 2010-08-25 NOTE — Telephone Encounter (Signed)
I have referred her to dr nida about her thyroid condition and he is treating her so she definitely needs to go for that, the other doc , I am uncertain who that is and why she needs to see him/her.inhaled corticosteroids that the hospitalist, if sop, no need to send me another msg. If another specialist I need the details

## 2010-08-25 NOTE — Discharge Summary (Signed)
NAMESADAKO, [name]                  ACCOUNT NO.:  [id number]  MEDICAL RECORD NO.:  [id number]  LOCATION:  A320                          FACILITY:  [facility]  PHYSICIAN:  [name], MDDATE OF BIRTH:  [birth year]  DATE OF ADMISSION:  08/15/2010 DATE OF DISCHARGE:  06/27/2012LH                              DISCHARGE SUMMARY   DISCHARGE DIAGNOSES: 1. Reported weakness and lethargy at home, possibly medication     related. 2. Recent urinary tract infection. 3. 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 4. Hyperthyroidism. 5. Pulmonary nodules, stable, needs repeat CT of the chest in 3     months. 6. Deconditioning.  DISCHARGE MEDICATIONS: 1. Lorazepam 0.5 mg one half to 1 tablet nightly as needed for sleep. 2. Change propranolol to 20 mg p.o. b.i.d. 3. Continue Advair 250/50 one puff daily. 4. Two liters of oxygen at all times. 5. Albuterol 2 puffs q.6 h. p.r.n. shortness of breath. 6. Aspirin 81 mg a day. 7. Calcium with vitamin D daily. 8. Claritin 10 mg a day. 9. Fish oil capsules one tablet twice a day. 10.Flonase 2 sprays twice a day as needed for nasal congestion. 11.Methimazole 5 mg p.o. daily. 12.Protonix 4 mg a day. 13.Spiriva 18 mcg inhaled daily. 14.Trilipix 135 mg nightly. 15.Tylenol 500 mg p.o. q.6 h. p.r.n. pain. 16.Stop diltiazem and stop Cozaar.  CONDITION:  Stable.  ACTIVITY:  Ad lib.  She is being set up with home health physical therapy and home health nursing for cardiopulmonary monitoring and medication compliance.  CONSULTATIONS:  Dr. Juanetta [name].  PROCEDURES:  None.  FOLLOWUP:  With Dr. Lodema [name].  Please arrange for outpatient CT in 3 months and monitor blood pressure and heart rate, adjust medications if needed.  DIET:  Should be regular.  Apparently, she takes multiple small meals a day.  LABORATORY DATA:  CBC:  Significant for platelet count of 508.  ABG on room  air showed a pH of 7.383, pCO2 46, pO2 57, bicarbonate 27, oxygen saturation 90%.  Complete metabolic panel on admission significant for a calcium of 10.7 which normalized the next day.  TSH normal at 0.705. Urinalysis was contaminated with many epithelial cells.  Urine culture negative.  DIAGNOSTICS:  MRI of the brain showed meningioma, 8 mm, left frontal area, no acute infarct, right mastoid sinus effusion, paranasal sinuses are clear.  CT of the chest shows stable bilateral pulmonary nodules. The most worrisome in the right apex measuring 6 x 8 mm and demonstrating spiculation, stable since January 2012.  Chest x-ray shows nothing acute hyperinflation.  EKG shows sinus tachycardia, rate 112, right atrial enlargement, minimal voltage criteria for LVH.  HISTORY AND HOSPITAL COURSE:  Please see H and P for complete admission details.  [name] is a 70 year old white female, who presents to the hospital with reported lethargy and weakness.  This is her third hospitalization in the past month.  She had been hospitalized on two prior occasions with urinary tract infection which has been treated. She had initially evidence of pyuria and bacteriuria, but this sample was contaminated as evidenced by many epithelial cells.  The cultures is negative and requires no further treatment for urinary tract infection.  She did have some borderline hypotension, which was relatively asymptomatic and some mild bradycardia into the high 40s and 50s.  It is conceivable that now that she is becoming euthyroid, she requires less propranolol and Cardizem.  I have therefore changed the propranolol to twice a day and stopped the diltiazem.  I have also stopped the Cozaar and this will need to be monitored as an outpatient by her primary care physician.  The family members are also concerned that she is taking her medications improperly.  They are concerned that she is taking her lorazepam during the daytime  which the patient vehemently denies.  They have found medications in the house dating back to 2005 and therefore I have sent a nurse to the house to assist with medications and cardiopulmonary monitoring.  She work with physical therapy, who did not feel she was appropriate for skilled nursing facility and the patient declines assisted living facility placement at this time.  I have had a long discussion with family members and the patient about strategies to keep her safe at home.  They will assist with her medications.  She also apparently has been noncompliant with her oxygen, which may be contributing to some of her symptoms..  CT of the chest was done, which showed no change in her pulmonary nodules.  Pulmonary Medicine was consulted and recommended no procedure at this time, simply a repeat CT scan in 3 months.  Total time on the day of discharge is greater than 30 minutes.     Sabrina Johansson L. Lendell Caprice, MD     CLS/MEDQ  D:  08/17/2010  T:  08/17/2010  Job:  [id number]  cc:   [name]. [name], M.D. Fax: [phone]  Electronically Signed by Sabrina Curb MD on 08/25/2010 07:15:42 PM

## 2010-08-26 NOTE — Telephone Encounter (Signed)
Daughter in law was who called and states she has no idea who the dr is and states Aymar may have been confused. Advised to keep appt with Dr Fransico Him

## 2010-08-31 ENCOUNTER — Encounter: Payer: Self-pay | Admitting: Family Medicine

## 2010-09-07 ENCOUNTER — Ambulatory Visit (INDEPENDENT_AMBULATORY_CARE_PROVIDER_SITE_OTHER): Payer: Medicare Other | Admitting: Family Medicine

## 2010-09-07 VITALS — BP 100/74 | HR 76 | Resp 18

## 2010-09-07 DIAGNOSIS — R5383 Other fatigue: Secondary | ICD-10-CM

## 2010-09-07 DIAGNOSIS — N390 Urinary tract infection, site not specified: Secondary | ICD-10-CM

## 2010-09-07 DIAGNOSIS — G4700 Insomnia, unspecified: Secondary | ICD-10-CM

## 2010-09-07 DIAGNOSIS — I10 Essential (primary) hypertension: Secondary | ICD-10-CM

## 2010-09-07 DIAGNOSIS — R531 Weakness: Secondary | ICD-10-CM | POA: Insufficient documentation

## 2010-09-07 DIAGNOSIS — E0590 Thyrotoxicosis, unspecified without thyrotoxic crisis or storm: Secondary | ICD-10-CM

## 2010-09-07 DIAGNOSIS — N760 Acute vaginitis: Secondary | ICD-10-CM

## 2010-09-07 MED ORDER — FLUCONAZOLE 150 MG PO TABS: 150.0000 mg | ORAL_TABLET | Freq: Once | ORAL | Status: AC

## 2010-09-07 NOTE — Patient Instructions (Addendum)
F/u in 6 weeks.  Pls use a night light instead of a lamp, this will help your sleep.  You are being referred for PT at the hospital once weekly.   Fluconazole x 1 tablet for discharge is sent in  In home  PT to be discontinued  Keep appt with Dr Fransico Him about your thyroid, this is important

## 2010-09-09 ENCOUNTER — Other Ambulatory Visit: Payer: Self-pay | Admitting: Family Medicine

## 2010-09-11 DIAGNOSIS — E0590 Thyrotoxicosis, unspecified without thyrotoxic crisis or storm: Secondary | ICD-10-CM | POA: Insufficient documentation

## 2010-09-11 NOTE — Progress Notes (Signed)
  Subjective:    Patient ID: [name], female    DOB: 25-[name]-1941, 70 y.o.   MRN: [id number]  HPI Pt in for f/o of recent hospitalization. Overall she states she feels better, energy and appetite are better, but she is still experiencing weakness and instability when ambulating or standing. She reports dissatisfaction with  the home PT and I believe that she will benefit from out pt PT as her weakness started developing over the past approx 6 months. She denies chills, dysuria or frequency. Reports poor sleep, anxiety over oxygen supply, and is requesting a letter be sent to ensure she has electricity at all times, she is oxygen dependent.   Review of Systems Denies recent fever or chills. Denies sinus pressure, nasal congestion, ear pain or sore throat. Chronic  chest congestion, cough or wheezing. Denies chest pains, palpitations, paroxysmal nocturnal dyspnea, orthopnea and leg swelling Denies abdominal pain, nausea, vomiting,diarrhea or constipation.  . Denies dysuria, frequency, hesitancy or incontinence.c/o white vag d/c which is slightly pruritic, she has been on prolonged antibiotic course  Denies headaches, seizure, numbness, or tingling. C/o mild depression,moderate  anxiety and  insomnia. Denies skin break down or rash.        Objective:   Physical Exam Patient alert and oriented and in no Cardiopulmonary distress.Chronically ill appearing  HEENT: No facial asymmetry, EOMI, no sinus tenderness, TM's clear, Oropharynx pink and moist.  Neck adequate ROM no adenopathy.  Chest: decreased air entry bilaterally, scattered crackles , few wheezes  CVS: S1, S2 no murmurs, no S3.  ABD: Soft non tender. Bowel sounds normal. Pelvic: not done, pt weak and unstable Ext: No edema  MS: decreased  ROM spine, shoulders, hips and knees.  Skin: Intact, no ulcerations or rash noted.  Psych: Good eye contact, flatl affect. Memory impairment, moderate,both  anxious and depressed  appearing.  CNS: CN 2-12 intact decreased , power and  tone  throughout.        Assessment & Plan:

## 2010-09-11 NOTE — Assessment & Plan Note (Signed)
Due to recent weight loss,and illness, pt noted to require significantly less medication for BP control

## 2010-09-11 NOTE — Assessment & Plan Note (Signed)
Complete resolutions of symptoms, and improved energy and apetitie

## 2010-09-11 NOTE — Assessment & Plan Note (Signed)
Poor sleep, states she keeps on light to ensure oxygen does not get cut off, advised use of night light and reduction in anxiety. Note to the power co also signed

## 2010-09-11 NOTE — Assessment & Plan Note (Signed)
Improved since last visit, but still reports extreme deconditioning, will refer for out pt PT

## 2010-09-11 NOTE — Assessment & Plan Note (Signed)
Improved, and being treated by endocrine, needs to keep f/u

## 2010-09-11 NOTE — Assessment & Plan Note (Signed)
Pt will be treated empirically for presumed candidiasis based on symptom and history

## 2010-09-12 ENCOUNTER — Ambulatory Visit: Payer: Medicare Other | Admitting: Family Medicine

## 2010-09-14 ENCOUNTER — Ambulatory Visit: Payer: PRIVATE HEALTH INSURANCE | Admitting: Family Medicine

## 2010-09-14 ENCOUNTER — Ambulatory Visit: Payer: Medicare Other | Admitting: Family Medicine

## 2010-09-19 ENCOUNTER — Ambulatory Visit (HOSPITAL_COMMUNITY): Payer: Medicare Other | Admitting: Physical Therapy

## 2010-09-26 ENCOUNTER — Encounter: Payer: Self-pay | Admitting: Internal Medicine

## 2010-10-01 ENCOUNTER — Other Ambulatory Visit: Payer: Self-pay | Admitting: Family Medicine

## 2010-10-18 ENCOUNTER — Encounter: Payer: Self-pay | Admitting: Family Medicine

## 2010-10-19 ENCOUNTER — Ambulatory Visit: Payer: Medicare Other | Admitting: Family Medicine

## 2010-10-25 ENCOUNTER — Encounter: Payer: Self-pay | Admitting: Family Medicine

## 2010-10-26 ENCOUNTER — Encounter: Payer: Self-pay | Admitting: Family Medicine

## 2010-10-26 ENCOUNTER — Other Ambulatory Visit: Payer: Self-pay | Admitting: Family Medicine

## 2010-10-26 ENCOUNTER — Ambulatory Visit (INDEPENDENT_AMBULATORY_CARE_PROVIDER_SITE_OTHER): Payer: Medicare Other | Admitting: Family Medicine

## 2010-10-26 VITALS — BP 150/90 | HR 81 | Resp 16 | Ht 62.0 in | Wt 90.1 lb

## 2010-10-26 DIAGNOSIS — E785 Hyperlipidemia, unspecified: Secondary | ICD-10-CM

## 2010-10-26 DIAGNOSIS — E0590 Thyrotoxicosis, unspecified without thyrotoxic crisis or storm: Secondary | ICD-10-CM

## 2010-10-26 DIAGNOSIS — R7301 Impaired fasting glucose: Secondary | ICD-10-CM

## 2010-10-26 DIAGNOSIS — R531 Weakness: Secondary | ICD-10-CM

## 2010-10-26 DIAGNOSIS — Z23 Encounter for immunization: Secondary | ICD-10-CM

## 2010-10-26 DIAGNOSIS — J449 Chronic obstructive pulmonary disease, unspecified: Secondary | ICD-10-CM

## 2010-10-26 DIAGNOSIS — R5383 Other fatigue: Secondary | ICD-10-CM

## 2010-10-26 DIAGNOSIS — R634 Abnormal weight loss: Secondary | ICD-10-CM

## 2010-10-26 DIAGNOSIS — I10 Essential (primary) hypertension: Secondary | ICD-10-CM

## 2010-10-26 DIAGNOSIS — Z139 Encounter for screening, unspecified: Secondary | ICD-10-CM

## 2010-10-26 MED ORDER — LOSARTAN POTASSIUM 25 MG PO TABS: ORAL_TABLET | ORAL | Status: DC

## 2010-10-26 NOTE — Progress Notes (Signed)
  Subjective:    Patient ID: [name], female    DOB: Sep 11, 1940, 70 y.o.   MRN: [id number]  HPI The PT is here for follow up and re-evaluation of chronic medical conditions, medication management and review of any available recent lab and radiology data.  Preventive health is updated, specifically  Cancer screening and Immunization.   Questions or concerns regarding consultations or procedures which the PT has had in the interim are  addressed. The PT denies any adverse reactions to current medications since the last visit.  Reports that she is gradually regaining strength, though still somewhat limited in her ADL's. Acknowledges the need to improve food intake. Still concerned over the fact that she has no recall of some of her behavior when she was critically ill earl;ier this year with urosepsis.    Review of Systems    Denies recent fever or chills. Denies sinus pressure, nasal congestion, ear pain or sore throat. Denies chest congestion, productive cough or wheezing.Chronic shortness of breath with oxygen dependence. Denies chest pains, palpitations and leg swelling Denies abdominal pain, nausea, vomiting,diarrhea or constipation.   Denies dysuria, frequency, hesitancy or incontinence. Denies joint pain, swelling and limitation in mobility. Denies headaches, seizures, numbness, or tingling. Denies depression, anxiety or insomnia. Denies skin break down or rash.     Objective:   Physical Exam Patient alert and oriented and in no cardiopulmonary distress.Looks improved since last visit.  HEENT: No facial asymmetry, EOMI, no sinus tenderness,  oropharynx pink and moist.  Neck supple no adenopathy.  Chest: Clear to auscultation bilaterally.Decreased air entry bilaterally.  CVS: S1, S2 positive murmur, no S3.  ABD: Soft non tender. Bowel sounds normal.  Ext: No edema  MS: Adequate ROM spine, shoulders, hips and knees.  Skin: Intact, no ulcerations or rash  noted.  Psych: Good eye contact, normal affect. Memory loss not anxious or depressed appearing.  CNS: CN 2-12 intact.       Assessment & Plan:

## 2010-10-26 NOTE — Patient Instructions (Signed)
F/u in 2 months.  Your blood pressure, is high, pls start an additional medication, cozaar HALF daily, continue the inderal as before.  Pls COMMIT to ENDURE  Or BOOST   TWO CANS EVERY DAY  And eat 3 meals every day.  LABWORK  NEEDS TO BE DONE BETWEEN 3 TO 7 DAYS BEFORE YOUR NEXT SCEDULED  VISIT.  THIS WILL IMPROVE THE QUALITY OF YOUR CARE.    Labs in 2 months

## 2010-10-26 NOTE — Assessment & Plan Note (Signed)
Reports feeling better, followed by endo

## 2010-10-26 NOTE — Assessment & Plan Note (Signed)
Low fat diet recommended, needs rept lab prior to next visit

## 2010-10-26 NOTE — Assessment & Plan Note (Signed)
Continues to be oxygen dependent, unchanged

## 2010-10-26 NOTE — Assessment & Plan Note (Signed)
Slight weight gain, pt to make more concerted effort to improve nutrition

## 2010-10-26 NOTE — Assessment & Plan Note (Signed)
Gradually regaining strength, but still very limited in her abiliry to carry out ADL'S , sibling who lives with her helps

## 2010-10-27 MED ORDER — INFLUENZA VAC TYPES A & B PF IM SUSP: 0.5000 mL | Freq: Once | INTRAMUSCULAR | Status: DC

## 2010-10-27 NOTE — Progress Notes (Signed)
Addended by: Abner Greenspan on: 10/27/2010 02:42 PM   Modules accepted: Orders

## 2010-11-01 ENCOUNTER — Ambulatory Visit (HOSPITAL_COMMUNITY): Payer: Medicare Other

## 2010-11-03 ENCOUNTER — Other Ambulatory Visit: Payer: Self-pay | Admitting: Family Medicine

## 2010-11-14 LAB — BLOOD GAS, ARTERIAL
FIO2: 0.21
Patient temperature: 37
pCO2 arterial: 36.4
pH, Arterial: 7.433 — ABNORMAL HIGH

## 2010-11-18 LAB — BASIC METABOLIC PANEL
BUN: 13
Calcium: 10.1
Chloride: 105
Creatinine, Ser: 1.23 — ABNORMAL HIGH
GFR calc Af Amer: 53 — ABNORMAL LOW

## 2010-11-18 LAB — CBC
MCHC: 33.8
MCV: 93
Platelets: 285
RBC: 4.01
WBC: 7.2

## 2010-11-22 ENCOUNTER — Encounter: Payer: Self-pay | Admitting: Internal Medicine

## 2010-11-22 ENCOUNTER — Other Ambulatory Visit: Payer: Self-pay | Admitting: Internal Medicine

## 2010-11-22 NOTE — Telephone Encounter (Signed)
Mailed reminder letter to patient for her to call and set up her next OV

## 2010-11-22 NOTE — Telephone Encounter (Signed)
Pt needs OV prior to further RFs to schedule EGD

## 2010-12-22 ENCOUNTER — Other Ambulatory Visit: Payer: Self-pay

## 2010-12-22 ENCOUNTER — Other Ambulatory Visit: Payer: Self-pay | Admitting: Family Medicine

## 2010-12-22 MED ORDER — PANTOPRAZOLE SODIUM 40 MG PO TBEC: 40.0000 mg | DELAYED_RELEASE_TABLET | Freq: Every day | ORAL | Status: DC

## 2010-12-22 NOTE — Telephone Encounter (Signed)
Needs appt with [place] for f/u EGD, hx [facility]. Missed in Aug 2012.

## 2010-12-23 ENCOUNTER — Encounter: Payer: Self-pay | Admitting: Family Medicine

## 2010-12-23 NOTE — Telephone Encounter (Signed)
Can you please make her a follow up appointment? Thanks

## 2010-12-23 NOTE — Telephone Encounter (Signed)
Pt is aware of OV on 11/13 at 230 with KJ

## 2010-12-27 ENCOUNTER — Ambulatory Visit (INDEPENDENT_AMBULATORY_CARE_PROVIDER_SITE_OTHER): Payer: Medicare Other | Admitting: Family Medicine

## 2010-12-27 ENCOUNTER — Encounter: Payer: Self-pay | Admitting: Family Medicine

## 2010-12-27 VITALS — BP 120/80 | HR 83 | Resp 16 | Ht 60.0 in | Wt 93.0 lb

## 2010-12-27 DIAGNOSIS — J449 Chronic obstructive pulmonary disease, unspecified: Secondary | ICD-10-CM

## 2010-12-27 DIAGNOSIS — E0590 Thyrotoxicosis, unspecified without thyrotoxic crisis or storm: Secondary | ICD-10-CM

## 2010-12-27 DIAGNOSIS — E785 Hyperlipidemia, unspecified: Secondary | ICD-10-CM

## 2010-12-27 DIAGNOSIS — R531 Weakness: Secondary | ICD-10-CM

## 2010-12-27 DIAGNOSIS — I10 Essential (primary) hypertension: Secondary | ICD-10-CM

## 2010-12-27 DIAGNOSIS — R5381 Other malaise: Secondary | ICD-10-CM

## 2010-12-27 DIAGNOSIS — F17200 Nicotine dependence, unspecified, uncomplicated: Secondary | ICD-10-CM

## 2010-12-27 NOTE — Progress Notes (Signed)
  Subjective:    Patient ID: [name], female    DOB: [birth year], 70 y.o.   MRN: [id number]  HPI The PT is here for follow up and re-evaluation of chronic medical conditions, medication management and review of any available recent lab and radiology data.  Preventive health is updated, specifically  Cancer screening and Immunization.   Questions or concerns regarding consultations or procedures which the PT has had in the interim are  addressed. The PT denies any adverse reactions to current medications since the last visit.  There are no new concerns.  Reports steady improvement in her health as far as appetite and strength are concerned. Feels better since thyroid is being treated       Review of Systems See HPI Denies recent fever or chills. Denies sinus pressure, nasal congestion, ear pain or sore throat. Chronic  chest congestion and  cough denies  wheezing. Denies chest pains, palpitations and leg swelling Denies abdominal pain, nausea, vomiting,diarrhea or constipation.   Denies dysuria, frequency, hesitancy or incontinence. Denies joint pain, swelling and limitation in mobility. Denies headaches, seizures, numbness, or tingling. Denies depression,or uncontrolled  anxiety or insomnia. Denies skin break down or rash.        Objective:   Physical Exam  Patient alert and oriented and in no cardiopulmonary distress.  HEENT: No facial asymmetry, EOMI, no sinus tenderness,  oropharynx pink and moist.  Neck  Adequate ROM, no adenopathy.  Chest: Clear to auscultation bilaterally.Decreased air entry throughout   CVS: S1, S2 no murmurs, no S3.  ABD: Soft non tender. Bowel sounds normal.  Ext: No edema   MS: Adequate though reduced  ROM spine, shoulders, hips and knees.  Skin: Intact, no ulcerations or rash noted.  Psych: Good eye contact, normal affect. Memory intact not anxious or depressed appearing.  CNS: CN 2-12 intact, power, tone and sensation normal  throughout.       Assessment & Plan:

## 2010-12-27 NOTE — Patient Instructions (Addendum)
F/u in 3 months.  You are doing better, please continue to eat regularly 3 meals per day at least.  Your blood pressure is great. I will call if your labs are not good.  No med changes at this time  You need to STOP smoking , and I am happy that you are getting there. You have agreed to smoke a maximum of 5 cigarettes per day, and I believe that by December 31, you will not smoke anymore

## 2010-12-28 LAB — BASIC METABOLIC PANEL
BUN: 14 mg/dL (ref 6–23)
CO2: 33 mEq/L — ABNORMAL HIGH (ref 19–32)
Calcium: 10.1 mg/dL (ref 8.4–10.5)
Chloride: 98 mEq/L (ref 96–112)
Creat: 0.9 mg/dL (ref 0.50–1.10)
Glucose, Bld: 113 mg/dL — ABNORMAL HIGH (ref 70–99)

## 2010-12-28 LAB — HEPATIC FUNCTION PANEL
ALT: 10 U/L (ref 0–35)
Albumin: 4.4 g/dL (ref 3.5–5.2)
Total Protein: 6.7 g/dL (ref 6.0–8.3)

## 2010-12-28 LAB — LIPID PANEL
Cholesterol: 207 mg/dL — ABNORMAL HIGH (ref 0–200)
HDL: 67 mg/dL (ref >39)
LDL Cholesterol: 124 mg/dL — ABNORMAL HIGH (ref 0–99)
Triglycerides: 80 mg/dL (ref <150)

## 2011-01-03 ENCOUNTER — Ambulatory Visit: Payer: No Typology Code available for payment source | Admitting: Urgent Care

## 2011-01-04 NOTE — Progress Notes (Signed)
Addended by: Abner Greenspan on: 01/04/2011 11:21 AM   Modules accepted: Orders

## 2011-01-10 NOTE — Assessment & Plan Note (Signed)
Treated by endocrine with improvement in symptoms most recent TSH within normal

## 2011-01-10 NOTE — Assessment & Plan Note (Signed)
Unchanged, counseled to quit smoking

## 2011-01-10 NOTE — Assessment & Plan Note (Signed)
Improved but still smoking trying to quit

## 2011-01-10 NOTE — Assessment & Plan Note (Addendum)
Marked improvement since last visit

## 2011-01-10 NOTE — Assessment & Plan Note (Signed)
Controlled, no change in medication  

## 2011-01-10 NOTE — Assessment & Plan Note (Signed)
Uncontrolled, no med change, reduction in fried and fatty foods

## 2011-03-22 ENCOUNTER — Other Ambulatory Visit: Payer: Self-pay | Admitting: Gastroenterology

## 2011-03-22 ENCOUNTER — Other Ambulatory Visit: Payer: Self-pay | Admitting: Family Medicine

## 2011-03-23 ENCOUNTER — Other Ambulatory Visit: Payer: Self-pay | Admitting: Gastroenterology

## 2011-03-23 MED ORDER — PANTOPRAZOLE SODIUM 40 MG PO TBEC: 40.0000 mg | DELAYED_RELEASE_TABLET | Freq: Every day | ORAL | Status: DC

## 2011-03-23 NOTE — Telephone Encounter (Signed)
[name], pt needs ov per [facility] please

## 2011-03-23 NOTE — Progress Notes (Signed)
One 90 d refill. Pt needs ov.

## 2011-03-27 ENCOUNTER — Encounter: Payer: Self-pay | Admitting: Family Medicine

## 2011-03-28 ENCOUNTER — Encounter: Payer: Self-pay | Admitting: Gastroenterology

## 2011-03-28 NOTE — Progress Notes (Signed)
Pt is aware of OV and appt card was mailed °

## 2011-03-28 NOTE — Telephone Encounter (Signed)
Pt is aware of OV on 2/19 @ 0930 with [facility] and appt card was mailed

## 2011-03-29 ENCOUNTER — Ambulatory Visit: Payer: Medicare Other | Admitting: Family Medicine

## 2011-04-10 ENCOUNTER — Encounter: Payer: Self-pay | Admitting: Internal Medicine

## 2011-04-11 ENCOUNTER — Ambulatory Visit: Payer: No Typology Code available for payment source | Admitting: Gastroenterology

## 2011-06-06 ENCOUNTER — Encounter (HOSPITAL_COMMUNITY): Payer: Self-pay | Admitting: Emergency Medicine

## 2011-06-06 ENCOUNTER — Emergency Department (HOSPITAL_COMMUNITY): Payer: Medicare Other

## 2011-06-06 ENCOUNTER — Emergency Department (HOSPITAL_COMMUNITY)
Admission: EM | Admit: 2011-06-06 | Discharge: 2011-06-06 | Disposition: A | Discharge status: Home / Self Care | Payer: Medicare Other | Attending: Emergency Medicine | Admitting: Emergency Medicine

## 2011-06-06 DIAGNOSIS — J4489 Other specified chronic obstructive pulmonary disease: Secondary | ICD-10-CM | POA: Insufficient documentation

## 2011-06-06 DIAGNOSIS — Z9981 Dependence on supplemental oxygen: Secondary | ICD-10-CM | POA: Diagnosis not present

## 2011-06-06 DIAGNOSIS — Z79899 Other long term (current) drug therapy: Secondary | ICD-10-CM | POA: Diagnosis not present

## 2011-06-06 DIAGNOSIS — M810 Age-related osteoporosis without current pathological fracture: Secondary | ICD-10-CM | POA: Diagnosis not present

## 2011-06-06 DIAGNOSIS — I10 Essential (primary) hypertension: Secondary | ICD-10-CM | POA: Diagnosis not present

## 2011-06-06 DIAGNOSIS — R0602 Shortness of breath: Secondary | ICD-10-CM | POA: Insufficient documentation

## 2011-06-06 DIAGNOSIS — E785 Hyperlipidemia, unspecified: Secondary | ICD-10-CM | POA: Insufficient documentation

## 2011-06-06 DIAGNOSIS — J449 Chronic obstructive pulmonary disease, unspecified: Secondary | ICD-10-CM

## 2011-06-06 LAB — DIFFERENTIAL
Basophils Absolute: 0 10*3/uL (ref 0.0–0.1)
Lymphocytes Relative: 23 % (ref 12–46)
Lymphs Abs: 1.8 10*3/uL (ref 0.7–4.0)
Neutro Abs: 5 10*3/uL (ref 1.7–7.7)

## 2011-06-06 LAB — BASIC METABOLIC PANEL
CO2: 36 mEq/L — ABNORMAL HIGH (ref 19–32)
Chloride: 95 mEq/L — ABNORMAL LOW (ref 96–112)
Glucose, Bld: 132 mg/dL — ABNORMAL HIGH (ref 70–99)
Potassium: 4.2 mEq/L (ref 3.5–5.1)
Sodium: 137 mEq/L (ref 135–145)

## 2011-06-06 LAB — CBC
MCV: 90.6 fL (ref 78.0–100.0)
Platelets: 391 10*3/uL (ref 150–400)
RBC: 4.13 MIL/uL (ref 3.87–5.11)
RDW: 13 % (ref 11.5–15.5)
WBC: 7.8 10*3/uL (ref 4.0–10.5)

## 2011-06-06 MED ORDER — PREDNISONE 10 MG PO TABS: 20.0000 mg | ORAL_TABLET | Freq: Every day | ORAL | Status: AC

## 2011-06-06 MED ORDER — MINOCYCLINE HCL 100 MG PO CAPS: 100.0000 mg | ORAL_CAPSULE | Freq: Two times a day (BID) | ORAL | Status: AC

## 2011-06-06 MED ORDER — HYDROCODONE-ACETAMINOPHEN 5-325 MG PO TABS: 1.0000 | ORAL_TABLET | ORAL | Status: DC | PRN

## 2011-06-06 MED ORDER — IPRATROPIUM BROMIDE 0.02 % IN SOLN
0.5000 mg | Freq: Once | RESPIRATORY_TRACT | Status: AC
  Administered 2011-06-06: 0.5 mg via RESPIRATORY_TRACT
  Filled 2011-06-06: qty 2.5

## 2011-06-06 MED ORDER — ALBUTEROL SULFATE (5 MG/ML) 0.5% IN NEBU
2.5000 mg | INHALATION_SOLUTION | Freq: Once | RESPIRATORY_TRACT | Status: AC
  Administered 2011-06-06: 2.5 mg via RESPIRATORY_TRACT
  Filled 2011-06-06: qty 0.5

## 2011-06-06 MED ORDER — MINOCYCLINE HCL 100 MG PO CAPS
100.0000 mg | ORAL_CAPSULE | Freq: Two times a day (BID) | ORAL | Status: DC
  Administered 2011-06-06: 100 mg via ORAL
  Filled 2011-06-06 (×4): qty 1

## 2011-06-06 MED ORDER — METHYLPREDNISOLONE SODIUM SUCC 125 MG IJ SOLR
125.0000 mg | Freq: Once | INTRAMUSCULAR | Status: AC
  Administered 2011-06-06: 125 mg via INTRAVENOUS
  Filled 2011-06-06: qty 2

## 2011-06-06 MED ORDER — ALBUTEROL SULFATE (5 MG/ML) 0.5% IN NEBU
5.0000 mg | INHALATION_SOLUTION | Freq: Once | RESPIRATORY_TRACT | Status: AC
  Administered 2011-06-06: 5 mg via RESPIRATORY_TRACT
  Filled 2011-06-06: qty 1

## 2011-06-06 NOTE — Discharge Instructions (Signed)
Your blood work, chest xray were normal for you. Continue your home medicines. Use nebulizer 4 times a day for the next 4 days and then as needed for wheezing. Take all of the antibiotic as directed. Take all of the prednisone. Follow up with your doctor.    Chronic Obstructive Pulmonary Disease Chronic obstructive pulmonary disease (COPD) is a lung disease. The lungs become damaged, making it hard to get air in and out of your lungs. The damage to your lungs cannot be changed.  HOME CARE  Stop smoking if you smoke. Avoid secondhand smoke.   Only take medicine as told by your doctor.   Talk to your doctor about using cough syrup or over-the-counter medicines.   Drink enough fluids to keep your pee (urine) clear or pale yellow.   Use a humidifier or vaporizer. This may help loosen the thick spit (mucus).   Talk to your doctor about vaccines that help prevent other lung problems (pneumonia and flu vaccines).   Use home oxygen as told by your doctor.   Stay active and exercise.   Eat healthy foods.  GET HELP RIGHT AWAY IF:   Your heart is beating fast.   You become disturbed, confused, shake, or are dazed.   You have trouble breathing.   You have chest pain.   You have a fever.   You cough up thick spit that is yellowish-white or green.   Your breathing becomes worse when you exercise.   You are running out of the medicine you take for your breathing.  MAKE SURE YOU:   Understand these instructions.   Will watch your condition.   Will get help right away if you are not doing well or get worse.  Document Released: 07/26/2007 Document Revised: 01/26/2011 Document Reviewed: 04/08/2010 [facility] Patient Information 2012 [place], [place].

## 2011-06-06 NOTE — ED Provider Notes (Signed)
History     CSN: [id number]  Arrival date & time 06/06/11  1839   First MD Initiated Contact with Patient 06/06/11 2021      Chief Complaint  Patient presents with  . Shortness of Breath  . Respiratory Distress  . Fatigue    (Consider location/radiation/quality/duration/timing/severity/associated sxs/prior treatment) HPI [name] is a 71 y.o. female with a h/o COPD on home O2 at 3L [place], aortic stenosis, HTN, renal insufficiency who presents to the Emergency Department complaining of increasing shortness of breath over the last few days not responding to home nebulizer. It has been associated with loss of appetite. Denies fever, chills, nausea, vomiting, diarrhea, chest pain.   Past Medical History  Diagnosis Date  . Rheumatic fever   . Renal insufficiency   . Aortic stenosis   . Hyperlipidemia   . Hypertension   . COPD with chronic bronchitis   . Osteoporosis   . Sebaceous cyst   . Colon cancer   . Urosepsis 2012    required several hospitalizations for this  . Thyroid disease 2012    hyperthyroid, had abl;ation therapy treated by endo  . Anxiety     Past Surgical History  Procedure Date  . 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 Appendectomy   . Cholecystectomy   . Lumbar laminectomy   . Tonsillectomy   . Colon surgery 2005  . Breast lumpectomy   . Esophagogastroduodenoscopy 10/12/09    short segment Barrett's esophage/small hiatal hernia,antral ersions    Family History  Problem Relation Age of Onset  . Heart attack Mother 45  . Stroke Father 58  . Hypertension Sister     2 sisters    History  Substance Use Topics  . Smoking status: Current Everyday Smoker -- 0.5 packs/day for 50 years    Types: Cigarettes  . Smokeless tobacco: Never Used  . Alcohol Use: No    OB History    Grav Para Term Preterm Abortions TAB SAB Ect Mult Living                  Review of Systems  Constitutional: Negative for fever.       10 Systems reviewed  and are negative for acute change except as noted in the HPI.  HENT: Negative for congestion.   Eyes: Negative for discharge and redness.  Respiratory: Positive for cough, shortness of breath and wheezing.   Cardiovascular: Negative for chest pain.  Gastrointestinal: Negative for vomiting and abdominal pain.  Musculoskeletal: Negative for back pain.  Skin: Negative for rash.  Neurological: Negative for syncope, numbness and headaches.  Psychiatric/Behavioral:       No behavior change.    Allergies  Bupropion hcl; Cyclobenzaprine hcl; Gadolinium; Levofloxacin; Lorazepam; Penicillins; Statins; and Sulfonamide derivatives  Home Medications   Current Outpatient Rx  Name Route Sig Dispense Refill  . ADVAIR DISKUS 250-50 MCG/DOSE IN AEPB  USE ONE PUFF TWICE A DAY 180 each 1  . ASPIRIN 81 MG PO TBEC Oral Take 81 mg by mouth every morning.     . QC CALCIUM/MINERALS/VITAMIN D 600-400 MG-UNIT PO TABS Oral Take 1 tablet by mouth every evening.     Marland Kitchen DIPHENHYDRAMINE HCL (SLEEP) 25 MG PO TABS Oral Take 50 mg by mouth at bedtime as needed. For sleep    . FLUTICASONE PROPIONATE 50 MCG/ACT NA SUSP Nasal 2 sprays by Nasal route daily as needed.      Marland Kitchen LORATADINE 10 MG  PO TABS Oral Take 10 mg by mouth every morning.     Marland Kitchen LOSARTAN POTASSIUM 25 MG PO TABS Oral Take 12.5 mg by mouth daily. If blood pressure levels are normal-patient does not take this medication    . METHIMAZOLE 5 MG PO TABS Oral Take 5 mg by mouth every morning.     Marland Kitchen FISH OIL CONCENTRATE 1000 MG PO CAPS Oral Take 1 capsule by mouth 2 (two) times daily.     Marland Kitchen PANTOPRAZOLE SODIUM 40 MG PO TBEC Oral Take 40 mg by mouth every morning.    Marland Kitchen PROPRANOLOL HCL 20 MG PO TABS Oral Take 20 mg by mouth 2 (two) times daily.      Marland Kitchen PROVENTIL HFA 108 (90 BASE) MCG/ACT IN AERS  INHALE 2 PUFFS BY MOUTH EVERY 4 TO 6 HOURS AS NEEDED 20.1 g 1  . SPIRIVA HANDIHALER 18 MCG IN CAPS  INHALE 1 CAPSULE VIA HANDIHALER ONCE DAILY AT THE SAME TIME EVERY DAY 90 each  1  . TRILIPIX 135 MG PO CPDR  TAKE ONE CAPSULE BY MOUTH AT BEDTIME 90 capsule 2    BP 153/53  Pulse 77  Temp(Src) 98.2 F (36.8 C) (Oral)  Resp 20  Ht 5\' 2"  (1.575 m)  Wt 93 lb (42.185 kg)  BMI 17.01 kg/m2  SpO2 99%  Physical Exam  Nursing note and vitals reviewed. Constitutional: No distress.       Awake, alert, nontoxic appearance.  HENT:  Head: Atraumatic.  Eyes: Right eye exhibits no discharge. Left eye exhibits no discharge.  Neck: Neck supple.  Cardiovascular: Normal rate.   Murmur heard. Pulmonary/Chest: Effort normal. No respiratory distress. She has wheezes. She exhibits no tenderness.  Abdominal: Soft. There is no tenderness. There is no rebound.  Musculoskeletal: She exhibits no tenderness.       Baseline ROM, no obvious new focal weakness.  Neurological:       Mental status and motor strength appears baseline for patient and situation.  Skin: No rash noted.  Psychiatric: She has a normal mood and affect.    ED Course  Procedures (including critical care time) Results for orders placed during the hospital encounter of 06/06/11  CBC      Component Value Range   WBC 7.8  4.0 - 10.5 (K/uL)   RBC 4.13  3.87 - 5.11 (MIL/uL)   Hemoglobin 11.8 (*) 12.0 - 15.0 (g/dL)   HCT 40.9  81.1 - 91.4 (%)   MCV 90.6  78.0 - 100.0 (fL)   MCH 28.6  26.0 - 34.0 (pg)   MCHC 31.6  30.0 - 36.0 (g/dL)   RDW 78.2  95.6 - 21.3 (%)   Platelets 391  150 - 400 (K/uL)  DIFFERENTIAL      Component Value Range   Neutrophils Relative 65  43 - 77 (%)   Neutro Abs 5.0  1.7 - 7.7 (K/uL)   Lymphocytes Relative 23  12 - 46 (%)   Lymphs Abs 1.8  0.7 - 4.0 (K/uL)   Monocytes Relative 11  3 - 12 (%)   Monocytes Absolute 0.8  0.1 - 1.0 (K/uL)   Eosinophils Relative 1  0 - 5 (%)   Eosinophils Absolute 0.1  0.0 - 0.7 (K/uL)   Basophils Relative 0  0 - 1 (%)   Basophils Absolute 0.0  0.0 - 0.1 (K/uL)  BASIC METABOLIC PANEL      Component Value Range   Sodium 137  135 - 145 (mEq/L)  Potassium 4.2  3.5 - 5.1 (mEq/L)   Chloride 95 (*) 96 - 112 (mEq/L)   CO2 36 (*) 19 - 32 (mEq/L)   Glucose, Bld 132 (*) 70 - 99 (mg/dL)   BUN 15  6 - 23 (mg/dL)   Creatinine, Ser 1.61  0.50 - 1.10 (mg/dL)   Calcium 09.6 (*) 8.4 - 10.5 (mg/dL)   GFR calc non Af Amer 83 (*) >90 (mL/min)   GFR calc Af Amer >90  >90 (mL/min)    Dg Chest Port 1 View  06/06/2011  *RADIOLOGY REPORT*  Clinical Data: Shortness of breath  PORTABLE CHEST - 1 VIEW  Comparison: 08/15/2010 CT and chest radiograph  Findings: Lungs are hyper aerated compatible with COPD. Chronically increased interstitial markings are stable.  Previously seen right apex nodule is not readily identified but could be obscured by the right anterior first rib and radiographic technique.  No pleural effusion.  Heart size is normal.  Evidence of median sternotomy.  No acute osseous finding.  IMPRESSION: Stable evidence of COPD.  No acute abnormality.  Given the history of previously evaluated pulmonary nodules, chest CT is recommended July 2013 for continued surveillance.  This could demonstrate 2- year stability from the previous exam 09/17/2009.  Original Report Authenticated By: Harrel Lemon, M.D.       MDM  Patient with COPD on home O2 here with increasing shortness of breath over the last three days. GIven albuterol/atrovent nebulizer treatment and initiated steroid therapy. Pain felt better. Wheezing improved.  Labs and chest xray were unremarkable. Initiated antibiotic therapy.  Pt feels improved after observation and/or treatment in ED.Pt stable in ED with no significant deterioration in condition.The patient appears reasonably screened and/or stabilized for discharge and I doubt any other medical condition or other [facility] requiring further screening, evaluation, or treatment in the ED at this time prior to discharge.  MDM Reviewed: nursing note and vitals Interpretation: labs and x-ray           Nicoletta Dress. Colon Branch, MD 06/09/11  [phone]

## 2011-06-06 NOTE — ED Notes (Signed)
Pt with SOB and resp distress at home.  Pt not eating and becoming weak.  Pt on home O2.

## 2011-06-07 ENCOUNTER — Other Ambulatory Visit: Payer: Self-pay | Admitting: Family Medicine

## 2011-06-07 ENCOUNTER — Telehealth (INDEPENDENT_AMBULATORY_CARE_PROVIDER_SITE_OTHER): Payer: Medicare Other

## 2011-06-07 ENCOUNTER — Ambulatory Visit: Payer: Medicare Other | Admitting: Family Medicine

## 2011-06-07 DIAGNOSIS — N3090 Cystitis, unspecified without hematuria: Secondary | ICD-10-CM

## 2011-06-07 DIAGNOSIS — E0590 Thyrotoxicosis, unspecified without thyrotoxic crisis or storm: Secondary | ICD-10-CM | POA: Diagnosis not present

## 2011-06-07 DIAGNOSIS — R4182 Altered mental status, unspecified: Secondary | ICD-10-CM

## 2011-06-07 LAB — POCT URINALYSIS DIPSTICK
Glucose, UA: NEGATIVE
Spec Grav, UA: 1.025
Urobilinogen, UA: 0.2

## 2011-06-07 MED ORDER — NITROFURANTOIN MONOHYD MACRO 100 MG PO CAPS: 100.0000 mg | ORAL_CAPSULE | Freq: Two times a day (BID) | ORAL | Status: AC

## 2011-06-07 NOTE — Telephone Encounter (Signed)
Sister [name] called and said Meilin is going through the same thing that happened last year. They had to take her to the ER because she had not been taking her meds, will hold them in her mouth and spit them out when they leave, not using her O2. She is having the mental changes again and being mean and uncooperative. At the ER they just gave her some fluids Carney Bern told them she was fine) and they sent her home and she has been right back to not eating, can't walk around, acting very strange just like last year but when the nurses and Drs are around she "knows what to say" she puts on a front and the sisters are scared of what may happen to her. Had appt today but where they were at the hospital so late they rescheduled for Monday. They states she is not acting like herself at all and its just like it was last year. Wants to know what to do     [name] (sister) [phone]

## 2011-06-07 NOTE — Telephone Encounter (Signed)
Advise med sent in for presumed UTI , also she needs to drink t 60 ounces water daily

## 2011-06-07 NOTE — Telephone Encounter (Signed)
I suggest TSH and also ccua, she had bad UTI the last time, order test someone can can collect specimen and bring it back for ccua pls

## 2011-06-07 NOTE — Telephone Encounter (Signed)
Addended by: Abner Greenspan on: 06/07/2011 04:20 PM   Modules accepted: Orders

## 2011-06-07 NOTE — Telephone Encounter (Signed)
Called and left message for pt to return call.  

## 2011-06-07 NOTE — Telephone Encounter (Signed)
Spoke with sister and she is aware of need for urine specimen.  Lab will be added to blood collected at ed

## 2011-06-07 NOTE — Telephone Encounter (Signed)
Brought back urine but not enough for culture. Did UA in office

## 2011-06-08 NOTE — Telephone Encounter (Signed)
Called and left message for pt to return call.  

## 2011-06-12 ENCOUNTER — Ambulatory Visit (INDEPENDENT_AMBULATORY_CARE_PROVIDER_SITE_OTHER): Payer: Medicare Other | Admitting: Family Medicine

## 2011-06-12 ENCOUNTER — Encounter: Payer: Self-pay | Admitting: Family Medicine

## 2011-06-12 VITALS — BP 150/82 | HR 70 | Resp 16 | Ht 60.0 in | Wt 92.0 lb

## 2011-06-12 DIAGNOSIS — J449 Chronic obstructive pulmonary disease, unspecified: Secondary | ICD-10-CM

## 2011-06-12 DIAGNOSIS — E0590 Thyrotoxicosis, unspecified without thyrotoxic crisis or storm: Secondary | ICD-10-CM

## 2011-06-12 DIAGNOSIS — E785 Hyperlipidemia, unspecified: Secondary | ICD-10-CM

## 2011-06-12 DIAGNOSIS — I10 Essential (primary) hypertension: Secondary | ICD-10-CM | POA: Diagnosis not present

## 2011-06-12 DIAGNOSIS — N3090 Cystitis, unspecified without hematuria: Secondary | ICD-10-CM

## 2011-06-12 DIAGNOSIS — R636 Underweight: Secondary | ICD-10-CM

## 2011-06-12 NOTE — Telephone Encounter (Signed)
appt scheduled for today 4/22

## 2011-06-12 NOTE — Patient Instructions (Addendum)
F/U in 8 weeks  Please resubmit a urine specimen for culture.  I believe that you will need tube feeding if you do not  Increase your food intake, so please work consistently on this Weight gain goal of 4 pounds. If you change your mind about an appetite stimulant, marrinol, please call and let me know.  You are referred to your pulmonary Doctor for follow u[p

## 2011-06-12 NOTE — Progress Notes (Signed)
  Subjective:    Patient ID: [name], female    DOB: 1940-11-17, 71 y.o.   MRN: [id number]  HPI The PT is here for follow up and re-evaluation of chronic medical conditions, medication management and review of any available recent lab and radiology data.  Preventive health is updated, specifically  Cancer screening and Immunization.   Questions or concerns regarding consultations or procedures which the PT has had in the interim are  addressed. The PT denies any adverse reactions to current medications since the last visit.  [place] has taken a step backward in the pas several weeks, deterioration in appetite and weight loss, increased confusion at times     Review of Systems See HPI Denies recent fever or chills. Denies sinus pressure, nasal congestion, ear pain or sore throat. Chronic chest congestion and cough. Denies chest pains, palpitations and leg swelling Denies abdominal pain, nausea, vomiting,diarrhea or constipation.   C/o frequency and mild dysuria x 5 days c/o joint pain, s and limitation in mobility.and generalized weakness Denies headaches, seizures, numbness, or tingling. Denies depression, anxiety or insomnia. Denies skin break down or rash.        Objective:   Physical Exam Patient alert ill appearing and in mild  cardiopulmonary distress, relies on supplemental oxygen.Wheelchair dependent due to generalized weakness.Undernourished  HEENT: No facial asymmetry, EOMI, no sinus tenderness,  oropharynx pink and moist.  Neck decreased ROM no adenopathy.  Chest: decreased air entry scattered crackles  CVS: S1, S2 systolic murmurs, no S3.  ABD: Soft non tender. Bowel sounds normal.  Ext: No edema  WU:[id number]  ROM spine, shoulders, hips and knees.  Skin: Intact, no ulcerations or rash noted.  Psych: Good eye contact, flat  affect. Memory mildly impaired  Anxious and depressed appearing.  CNS: CN 2-12 intact, power, normal throughout.     Assessment &  Plan:   Subjective:     Subjective:

## 2011-06-12 NOTE — Telephone Encounter (Signed)
Unable to contact pt by phone 

## 2011-06-15 LAB — URINE CULTURE: Colony Count: NO GROWTH

## 2011-06-20 ENCOUNTER — Other Ambulatory Visit: Payer: Self-pay | Admitting: Family Medicine

## 2011-06-25 ENCOUNTER — Other Ambulatory Visit: Payer: Self-pay | Admitting: Family Medicine

## 2011-07-05 DIAGNOSIS — R636 Underweight: Secondary | ICD-10-CM | POA: Insufficient documentation

## 2011-07-05 NOTE — Assessment & Plan Note (Signed)
Deteriorating, ongoing nicotine use

## 2011-07-05 NOTE — Assessment & Plan Note (Signed)
Uncontrolled , no med change at this time 

## 2011-07-05 NOTE — Assessment & Plan Note (Signed)
Managed by endo

## 2011-07-05 NOTE — Assessment & Plan Note (Signed)
Currently being treated presumptively, based on symptoms

## 2011-07-05 NOTE — Assessment & Plan Note (Signed)
Worsening intake, may require feeding tube

## 2011-07-05 NOTE — Assessment & Plan Note (Signed)
Hyperlipidemia:Low fat diet discussed and encouraged.  Updated lab needed 

## 2011-07-13 ENCOUNTER — Other Ambulatory Visit: Payer: Self-pay | Admitting: Gastroenterology

## 2011-07-14 ENCOUNTER — Ambulatory Visit: Payer: Medicare Other | Admitting: Emergency Medicine

## 2011-07-14 NOTE — Telephone Encounter (Signed)
Patient needs OV.

## 2011-07-18 ENCOUNTER — Encounter: Payer: Self-pay | Admitting: Internal Medicine

## 2011-07-18 NOTE — Progress Notes (Unsigned)
Darl Pikes, please schedule ov.    Tana Coast, PA 07/14/2011 12:22 PM Signed  Patient needs OV.

## 2011-07-18 NOTE — Progress Notes (Signed)
Mailed letter for patient to call and [facility] her OV to further her refills

## 2011-07-28 ENCOUNTER — Telehealth: Payer: Self-pay | Admitting: Family Medicine

## 2011-07-28 ENCOUNTER — Other Ambulatory Visit: Payer: Self-pay | Admitting: Family Medicine

## 2011-07-28 NOTE — Telephone Encounter (Signed)
Already sent in today.

## 2011-07-31 ENCOUNTER — Encounter: Payer: Self-pay | Admitting: Internal Medicine

## 2011-08-04 ENCOUNTER — Other Ambulatory Visit: Payer: Self-pay | Admitting: Family Medicine

## 2011-08-07 ENCOUNTER — Telehealth: Payer: Self-pay | Admitting: Gastroenterology

## 2011-08-07 ENCOUNTER — Ambulatory Visit: Payer: Medicare Other | Admitting: Gastroenterology

## 2011-08-07 ENCOUNTER — Ambulatory Visit: Payer: Medicare Other | Admitting: Emergency Medicine

## 2011-08-07 NOTE — Telephone Encounter (Signed)
Please discuss with provider before rescheduling any further appt. Patient has no showed twice.

## 2011-08-07 NOTE — Telephone Encounter (Signed)
Pt was a no show

## 2011-10-31 ENCOUNTER — Other Ambulatory Visit: Payer: Self-pay | Admitting: Gastroenterology

## 2011-11-02 ENCOUNTER — Other Ambulatory Visit: Payer: Self-pay | Admitting: Gastroenterology

## 2011-11-22 ENCOUNTER — Ambulatory Visit (INDEPENDENT_AMBULATORY_CARE_PROVIDER_SITE_OTHER): Payer: Medicare Other | Admitting: Family Medicine

## 2011-11-22 ENCOUNTER — Encounter: Payer: Self-pay | Admitting: Family Medicine

## 2011-11-22 VITALS — BP 130/68 | HR 92 | Resp 18 | Ht 60.0 in | Wt 100.1 lb

## 2011-11-22 DIAGNOSIS — F17200 Nicotine dependence, unspecified, uncomplicated: Secondary | ICD-10-CM

## 2011-11-22 DIAGNOSIS — R636 Underweight: Secondary | ICD-10-CM

## 2011-11-22 DIAGNOSIS — E0590 Thyrotoxicosis, unspecified without thyrotoxic crisis or storm: Secondary | ICD-10-CM

## 2011-11-22 DIAGNOSIS — E785 Hyperlipidemia, unspecified: Secondary | ICD-10-CM

## 2011-11-22 DIAGNOSIS — I10 Essential (primary) hypertension: Secondary | ICD-10-CM

## 2011-11-22 DIAGNOSIS — Z87440 Personal history of urinary (tract) infections: Secondary | ICD-10-CM | POA: Diagnosis not present

## 2011-11-22 DIAGNOSIS — Z1211 Encounter for screening for malignant neoplasm of colon: Secondary | ICD-10-CM

## 2011-11-22 DIAGNOSIS — N3000 Acute cystitis without hematuria: Secondary | ICD-10-CM | POA: Diagnosis not present

## 2011-11-22 DIAGNOSIS — Z23 Encounter for immunization: Secondary | ICD-10-CM

## 2011-11-22 DIAGNOSIS — J984 Other disorders of lung: Secondary | ICD-10-CM

## 2011-11-22 LAB — POCT URINALYSIS DIPSTICK
Ketones, UA: NEGATIVE
Protein, UA: NEGATIVE
Urobilinogen, UA: 0.2

## 2011-11-22 NOTE — Progress Notes (Signed)
  Subjective:    Patient ID: [name], female    DOB: 10-Jul-1940, 71 y.o.   MRN: [id number]  HPI The PT is here for follow up and re-evaluation of chronic medical conditions, medication management and review of any available recent lab and radiology data.  Preventive health is updated, specifically  Cancer screening and Immunization.   . The PT denies any adverse reactions to current medications since the last visit. States she has been house confined, reports improvement in appetite  There are no new concerns.  1 week h/o increased urinary frequency with mild dysuria, denies fever or chills  Has cut back a lot on cigarettes though still smokes, and not setting a quit date today      Review of Systems See HPI Denies recent fever or chills. Denies sinus pressure, nasal congestion, ear pain or sore throat. Chronic dyspnea, oxygen dependent, no current productive cough Denies chest pains, palpitations and leg swelling Denies abdominal pain, nausea, vomiting,diarrhea or constipation.   Denies dysuria, frequency, hesitancy or incontinence. Denies joint pain, swelling and limitation in mobility. Denies headaches, seizures, numbness, or tingling. Denies depression, anxiety or insomnia. Denies skin break down or rash.        Objective:   Physical Exam  Patient alert and oriented and in no cardiopulmonary distress however oxygen dependent.  HEENT: No facial asymmetry, EOMI, no sinus tenderness,  oropharynx pink and moist.  Neck decreased ROM no adenopathy.  Chest: decreased air entry, scattered crackles and a few wheezes.  CVS: S1, S2 no murmurs, no S3.  ABD: Soft non tender. Bowel sounds normal. Rectal: no mass, guaiac neg stool Ext: No edema  MS: Adequate ROM spine, shoulders, hips and knees.  Skin: Intact, no ulcerations or rash noted.  Psych: Good eye contact, normal affect. Memory intact not anxious or depressed appearing. MMSE; 27  CNS: CN 2-12 intact, power,  tone and sensation normal throughout.       Assessment & Plan:

## 2011-11-22 NOTE — Patient Instructions (Addendum)
Annual wellness in 4 month, please call if you need me before  You look much better and have gained 8 pounds , keep it up   Please schedule your mammogram  Please check about the shingles vaccine  Fasting lipid and cmp first week in Novemeber  Flu vaccine today, also rectal exam is normal  Urine is being sent for testing we will call if you need antibiotics  Congrats on cutting back on smoking, you need to quit   You will be referred for chest CT scan to f/u lung nodules and also for a bone density scan

## 2011-11-23 NOTE — Assessment & Plan Note (Signed)
Hyperlipidemia:Low fat diet discussed and encouraged.  Updated lab needed 

## 2011-11-23 NOTE — Assessment & Plan Note (Signed)
Less dependence on cigarettes per pt, needs to quit. Patient counseled for approximately 5 minutes regarding the health risks of ongoing nicotine use, specifically all types of cancer, heart disease, stroke and respiratory failure. The options available for help with cessation ,the behavioral changes to assist the process, and the option to either gradully reduce usage  Or abruptly stop.is also discussed. Pt is also encouraged to set specific goals in number of cigarettes used daily, as well as to set a quit date.

## 2011-11-23 NOTE — Assessment & Plan Note (Signed)
Ongoing nicotine use will order chest ct

## 2011-11-23 NOTE — Assessment & Plan Note (Signed)
Mild symptoms.t h/o resistant uTI requiring hospitalization, eill send for c/s before treating

## 2011-11-23 NOTE — Assessment & Plan Note (Signed)
Followed by endo, was dx and treated in 2012

## 2011-11-23 NOTE — Assessment & Plan Note (Signed)
Controlled, no change in medication DASH diet and commitment to daily physical activity for a minimum of 30 minutes discussed and encouraged, as a part of hypertension management. The importance of attaining a healthy weight is also discussed.  

## 2011-11-23 NOTE — Assessment & Plan Note (Signed)
Improved, 8 pound weight gain since last visit

## 2011-11-25 ENCOUNTER — Other Ambulatory Visit: Payer: Self-pay | Admitting: Family Medicine

## 2011-11-25 LAB — URINE CULTURE

## 2011-11-25 MED ORDER — NITROFURANTOIN MACROCRYSTAL 50 MG PO CAPS: 50.0000 mg | ORAL_CAPSULE | Freq: Four times a day (QID) | ORAL | Status: DC

## 2011-11-25 MED ORDER — EPINEPHRINE 0.3 MG/0.3ML IJ DEVI: 0.3000 mg | Freq: Once | INTRAMUSCULAR | Status: AC

## 2011-11-26 ENCOUNTER — Encounter (HOSPITAL_COMMUNITY): Payer: Self-pay

## 2011-11-26 ENCOUNTER — Emergency Department (HOSPITAL_COMMUNITY): Payer: Medicare Other

## 2011-11-26 ENCOUNTER — Observation Stay (HOSPITAL_COMMUNITY)
Admission: EM | Admit: 2011-11-26 | Discharge: 2011-11-27 | DRG: 191 | Disposition: A | Discharge status: Home / Self Care | Payer: Medicare Other | Attending: Internal Medicine | Admitting: Internal Medicine

## 2011-11-26 DIAGNOSIS — E039 Hypothyroidism, unspecified: Secondary | ICD-10-CM | POA: Diagnosis present

## 2011-11-26 DIAGNOSIS — Z79899 Other long term (current) drug therapy: Secondary | ICD-10-CM | POA: Diagnosis not present

## 2011-11-26 DIAGNOSIS — N390 Urinary tract infection, site not specified: Secondary | ICD-10-CM | POA: Diagnosis not present

## 2011-11-26 DIAGNOSIS — R062 Wheezing: Secondary | ICD-10-CM

## 2011-11-26 DIAGNOSIS — M818 Other osteoporosis without current pathological fracture: Secondary | ICD-10-CM

## 2011-11-26 DIAGNOSIS — G4700 Insomnia, unspecified: Secondary | ICD-10-CM

## 2011-11-26 DIAGNOSIS — F329 Major depressive disorder, single episode, unspecified: Secondary | ICD-10-CM

## 2011-11-26 DIAGNOSIS — J984 Other disorders of lung: Secondary | ICD-10-CM

## 2011-11-26 DIAGNOSIS — I509 Heart failure, unspecified: Secondary | ICD-10-CM

## 2011-11-26 DIAGNOSIS — Z823 Family history of stroke: Secondary | ICD-10-CM

## 2011-11-26 DIAGNOSIS — Z954 Presence of other heart-valve replacement: Secondary | ICD-10-CM | POA: Diagnosis not present

## 2011-11-26 DIAGNOSIS — Z9089 Acquired absence of other organs: Secondary | ICD-10-CM

## 2011-11-26 DIAGNOSIS — A498 Other bacterial infections of unspecified site: Secondary | ICD-10-CM | POA: Diagnosis present

## 2011-11-26 DIAGNOSIS — E0590 Thyrotoxicosis, unspecified without thyrotoxic crisis or storm: Secondary | ICD-10-CM

## 2011-11-26 DIAGNOSIS — J449 Chronic obstructive pulmonary disease, unspecified: Secondary | ICD-10-CM

## 2011-11-26 DIAGNOSIS — Z8249 Family history of ischemic heart disease and other diseases of the circulatory system: Secondary | ICD-10-CM

## 2011-11-26 DIAGNOSIS — J441 Chronic obstructive pulmonary disease with (acute) exacerbation: Secondary | ICD-10-CM | POA: Diagnosis not present

## 2011-11-26 DIAGNOSIS — I10 Essential (primary) hypertension: Secondary | ICD-10-CM | POA: Diagnosis not present

## 2011-11-26 DIAGNOSIS — R634 Abnormal weight loss: Secondary | ICD-10-CM

## 2011-11-26 DIAGNOSIS — F411 Generalized anxiety disorder: Secondary | ICD-10-CM | POA: Diagnosis not present

## 2011-11-26 DIAGNOSIS — I359 Nonrheumatic aortic valve disorder, unspecified: Secondary | ICD-10-CM

## 2011-11-26 DIAGNOSIS — E079 Disorder of thyroid, unspecified: Secondary | ICD-10-CM

## 2011-11-26 DIAGNOSIS — N3000 Acute cystitis without hematuria: Secondary | ICD-10-CM

## 2011-11-26 DIAGNOSIS — F17200 Nicotine dependence, unspecified, uncomplicated: Secondary | ICD-10-CM | POA: Diagnosis present

## 2011-11-26 DIAGNOSIS — Z23 Encounter for immunization: Secondary | ICD-10-CM

## 2011-11-26 DIAGNOSIS — M810 Age-related osteoporosis without current pathological fracture: Secondary | ICD-10-CM | POA: Diagnosis present

## 2011-11-26 DIAGNOSIS — R6881 Early satiety: Secondary | ICD-10-CM

## 2011-11-26 DIAGNOSIS — R7301 Impaired fasting glucose: Secondary | ICD-10-CM

## 2011-11-26 DIAGNOSIS — R0602 Shortness of breath: Secondary | ICD-10-CM | POA: Diagnosis not present

## 2011-11-26 DIAGNOSIS — R636 Underweight: Secondary | ICD-10-CM

## 2011-11-26 DIAGNOSIS — D72829 Elevated white blood cell count, unspecified: Secondary | ICD-10-CM

## 2011-11-26 DIAGNOSIS — E785 Hyperlipidemia, unspecified: Secondary | ICD-10-CM

## 2011-11-26 DIAGNOSIS — R531 Weakness: Secondary | ICD-10-CM

## 2011-11-26 DIAGNOSIS — M7710 Lateral epicondylitis, unspecified elbow: Secondary | ICD-10-CM

## 2011-11-26 DIAGNOSIS — J42 Unspecified chronic bronchitis: Secondary | ICD-10-CM

## 2011-11-26 DIAGNOSIS — L723 Sebaceous cyst: Secondary | ICD-10-CM

## 2011-11-26 DIAGNOSIS — L989 Disorder of the skin and subcutaneous tissue, unspecified: Secondary | ICD-10-CM

## 2011-11-26 DIAGNOSIS — K5900 Constipation, unspecified: Secondary | ICD-10-CM

## 2011-11-26 DIAGNOSIS — N760 Acute vaginitis: Secondary | ICD-10-CM

## 2011-11-26 DIAGNOSIS — R5381 Other malaise: Secondary | ICD-10-CM

## 2011-11-26 LAB — URINALYSIS, ROUTINE W REFLEX MICROSCOPIC
Glucose, UA: NEGATIVE mg/dL
Leukocytes, UA: NEGATIVE
Nitrite: NEGATIVE
Protein, ur: NEGATIVE mg/dL
pH: 7 (ref 5.0–8.0)

## 2011-11-26 LAB — CBC
MCH: 29.6 pg (ref 26.0–34.0)
MCHC: 32.5 g/dL (ref 30.0–36.0)
MCV: 91.2 fL (ref 78.0–100.0)
Platelets: 375 10*3/uL (ref 150–400)
RDW: 14 % (ref 11.5–15.5)
WBC: 20.4 10*3/uL — ABNORMAL HIGH (ref 4.0–10.5)

## 2011-11-26 LAB — COMPREHENSIVE METABOLIC PANEL
AST: 25 U/L (ref 0–37)
Albumin: 4 g/dL (ref 3.5–5.2)
Calcium: 10.4 mg/dL (ref 8.4–10.5)
Creatinine, Ser: 0.85 mg/dL (ref 0.50–1.10)

## 2011-11-26 MED ORDER — FLEET ENEMA 7-19 GM/118ML RE ENEM: 1.0000 | ENEMA | Freq: Once | RECTAL | Status: AC | PRN

## 2011-11-26 MED ORDER — ACETAMINOPHEN 325 MG PO TABS: 650.0000 mg | ORAL_TABLET | Freq: Four times a day (QID) | ORAL | Status: DC | PRN

## 2011-11-26 MED ORDER — IPRATROPIUM BROMIDE 0.02 % IN SOLN
0.5000 mg | Freq: Once | RESPIRATORY_TRACT | Status: AC
  Administered 2011-11-26: 0.5 mg via RESPIRATORY_TRACT
  Filled 2011-11-26: qty 2.5

## 2011-11-26 MED ORDER — LORATADINE 10 MG PO TABS
10.0000 mg | ORAL_TABLET | Freq: Every day | ORAL | Status: DC
  Administered 2011-11-27: 10 mg via ORAL
  Filled 2011-11-26: qty 1

## 2011-11-26 MED ORDER — LOSARTAN POTASSIUM 25 MG PO TABS
12.5000 mg | ORAL_TABLET | Freq: Every day | ORAL | Status: DC
  Administered 2011-11-27: 12.5 mg via ORAL
  Filled 2011-11-26 (×3): qty 0.5

## 2011-11-26 MED ORDER — ENOXAPARIN SODIUM 40 MG/0.4ML ~~LOC~~ SOLN
40.0000 mg | SUBCUTANEOUS | Status: DC
  Administered 2011-11-27: 40 mg via SUBCUTANEOUS
  Filled 2011-11-26: qty 0.4

## 2011-11-26 MED ORDER — TRAZODONE HCL 50 MG PO TABS
25.0000 mg | ORAL_TABLET | Freq: Every evening | ORAL | Status: DC | PRN
  Administered 2011-11-27: 25 mg via ORAL
  Filled 2011-11-26: qty 2

## 2011-11-26 MED ORDER — ASPIRIN EC 81 MG PO TBEC
81.0000 mg | DELAYED_RELEASE_TABLET | Freq: Every day | ORAL | Status: DC
  Administered 2011-11-27: 81 mg via ORAL
  Filled 2011-11-26: qty 1

## 2011-11-26 MED ORDER — ALBUTEROL SULFATE (5 MG/ML) 0.5% IN NEBU: 2.5000 mg | INHALATION_SOLUTION | RESPIRATORY_TRACT | Status: DC | PRN

## 2011-11-26 MED ORDER — SODIUM CHLORIDE 0.9 % IV SOLN
INTRAVENOUS | Status: DC
  Administered 2011-11-26: 20 mL/h via INTRAVENOUS

## 2011-11-26 MED ORDER — FOSFOMYCIN TROMETHAMINE 3 G PO PACK
3.0000 g | PACK | Freq: Once | ORAL | Status: AC
  Administered 2011-11-27: 3 g via ORAL
  Filled 2011-11-26: qty 3

## 2011-11-26 MED ORDER — METHIMAZOLE 5 MG PO TABS
5.0000 mg | ORAL_TABLET | Freq: Every day | ORAL | Status: DC
  Administered 2011-11-27: 5 mg via ORAL
  Filled 2011-11-26 (×3): qty 1

## 2011-11-26 MED ORDER — FLUTICASONE-SALMETEROL 250-50 MCG/DOSE IN AEPB
1.0000 | INHALATION_SPRAY | Freq: Two times a day (BID) | RESPIRATORY_TRACT | Status: DC
  Administered 2011-11-27: 1 via RESPIRATORY_TRACT
  Filled 2011-11-26: qty 14

## 2011-11-26 MED ORDER — ALBUTEROL SULFATE (5 MG/ML) 0.5% IN NEBU
5.0000 mg | INHALATION_SOLUTION | Freq: Once | RESPIRATORY_TRACT | Status: AC
  Administered 2011-11-26: 5 mg via RESPIRATORY_TRACT
  Filled 2011-11-26: qty 1

## 2011-11-26 MED ORDER — OMEGA-3-ACID ETHYL ESTERS 1 G PO CAPS
1.0000 g | ORAL_CAPSULE | Freq: Every day | ORAL | Status: DC
  Administered 2011-11-27: 1 g via ORAL
  Filled 2011-11-26: qty 1

## 2011-11-26 MED ORDER — SODIUM CHLORIDE 0.9 % IJ SOLN
3.0000 mL | Freq: Two times a day (BID) | INTRAMUSCULAR | Status: DC
  Administered 2011-11-27: 3 mL via INTRAVENOUS
  Filled 2011-11-26: qty 3

## 2011-11-26 MED ORDER — ACETAMINOPHEN 650 MG RE SUPP: 650.0000 mg | Freq: Four times a day (QID) | RECTAL | Status: DC | PRN

## 2011-11-26 MED ORDER — METHYLPREDNISOLONE SODIUM SUCC 125 MG IJ SOLR
125.0000 mg | Freq: Once | INTRAMUSCULAR | Status: AC
  Administered 2011-11-26: 125 mg via INTRAVENOUS
  Filled 2011-11-26: qty 2

## 2011-11-26 MED ORDER — ALBUTEROL SULFATE (5 MG/ML) 0.5% IN NEBU
2.5000 mg | INHALATION_SOLUTION | RESPIRATORY_TRACT | Status: DC
  Administered 2011-11-27 (×2): 2.5 mg via RESPIRATORY_TRACT
  Filled 2011-11-26 (×2): qty 0.5

## 2011-11-26 MED ORDER — FENOFIBRATE 160 MG PO TABS
160.0000 mg | ORAL_TABLET | Freq: Every day | ORAL | Status: DC
  Administered 2011-11-27: 160 mg via ORAL
  Filled 2011-11-26 (×3): qty 1

## 2011-11-26 MED ORDER — GUAIFENESIN ER 600 MG PO TB12
1200.0000 mg | ORAL_TABLET | Freq: Two times a day (BID) | ORAL | Status: DC
  Administered 2011-11-27 (×2): 1200 mg via ORAL
  Filled 2011-11-26 (×2): qty 2

## 2011-11-26 MED ORDER — FLUTICASONE PROPIONATE 50 MCG/ACT NA SUSP
2.0000 | Freq: Every day | NASAL | Status: DC | PRN
  Filled 2011-11-26: qty 16

## 2011-11-26 MED ORDER — PROPRANOLOL HCL 20 MG PO TABS
20.0000 mg | ORAL_TABLET | Freq: Two times a day (BID) | ORAL | Status: DC
  Administered 2011-11-27 (×2): 20 mg via ORAL
  Filled 2011-11-26 (×2): qty 1

## 2011-11-26 MED ORDER — POTASSIUM CHLORIDE IN NACL 20-0.9 MEQ/L-% IV SOLN
INTRAVENOUS | Status: DC
  Administered 2011-11-26: via INTRAVENOUS

## 2011-11-26 MED ORDER — PANTOPRAZOLE SODIUM 40 MG PO TBEC
40.0000 mg | DELAYED_RELEASE_TABLET | Freq: Every day | ORAL | Status: DC
  Administered 2011-11-27: 40 mg via ORAL
  Filled 2011-11-26: qty 1

## 2011-11-26 MED ORDER — IPRATROPIUM BROMIDE 0.02 % IN SOLN
0.5000 mg | RESPIRATORY_TRACT | Status: DC
  Administered 2011-11-27 (×2): 0.5 mg via RESPIRATORY_TRACT
  Filled 2011-11-26 (×2): qty 2.5

## 2011-11-26 MED ORDER — METHYLPREDNISOLONE SODIUM SUCC 125 MG IJ SOLR
125.0000 mg | Freq: Four times a day (QID) | INTRAMUSCULAR | Status: DC
  Administered 2011-11-27 (×2): 125 mg via INTRAVENOUS
  Filled 2011-11-26 (×2): qty 2

## 2011-11-26 MED ORDER — BISACODYL 5 MG PO TBEC: 5.0000 mg | DELAYED_RELEASE_TABLET | Freq: Every day | ORAL | Status: DC | PRN

## 2011-11-26 NOTE — ED Notes (Signed)
Pt presents with SOB. Pt is noted tachypnea,SOB at rest and with exertion. Pt is currently on 3 LPN [place], and reports wearing a 2 LPM [place] at home at all time. Pt reports being SOB since 1999, however sob started to increase this afternoon at 5 pm. Breathing treatment ordered for said pt.

## 2011-11-26 NOTE — ED Provider Notes (Addendum)
History     CSN: [id number]  Arrival date & time 11/26/11  1845   First MD Initiated Contact with Patient 11/26/11 1858      Chief Complaint  Patient presents with  . Shortness of Breath    (Consider location/radiation/quality/duration/timing/severity/associated sxs/prior treatment) Patient is a 71 y.o. female presenting with shortness of breath. The history is provided by the patient and a relative.  Shortness of Breath  Associated symptoms include shortness of breath. Pertinent negatives include no chest pain, no fever and no cough.  pt with hx copd c/o sob onset today, w wheezing. Constant. No specific exacerbating or alleviating factors. Did not use mdi or nebulizer at all today. Is one home o2 2 liters continuously. w copd, notes prior admits, denies icu stay or intubation. Denies cough or fever. No chills/sweats. No leg swelling or pain. No dvt or pe hx. No current or recent cp. No orthopnea or pnd. When pt felt more sob today, she thought may be due to recent rx uti w macrobid - no rash, no hives, no itching, no throat swelling.      Past Medical History  Diagnosis Date  . Rheumatic fever   . Renal insufficiency   . Aortic stenosis   . Hyperlipidemia   . Hypertension   . COPD with chronic bronchitis   . Osteoporosis   . Sebaceous cyst   . Urosepsis 2012    required several hospitalizations for this  . Thyroid disease 2012    hyperthyroid, had abl;ation therapy treated by endo  . Anxiety     Past Surgical History  Procedure Date  . 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 Appendectomy   . Cholecystectomy   . Lumbar laminectomy   . Tonsillectomy   . Colon surgery 2005  . Breast lumpectomy   . Esophagogastroduodenoscopy 10/12/09    short segment Barrett's esophage/small hiatal hernia,antral ersions  . Colonoscopy 07/23/2008     Single diminutive polyp in the rectum status post cold biopsy removal/ Status post right hemicolectomy/ Colonic ulcers in the  upstream end the colon distal to the  anastomosis as described above.  Status post biopsy. The anastomosis itself and neoterminal ileum appeared normal    Family History  Problem Relation Age of Onset  . Heart attack Mother 60  . Stroke Father 37  . Hypertension Sister     2 sisters    History  Substance Use Topics  . Smoking status: Current Some Day Smoker -- 0.5 packs/day for 50 years    Types: Cigarettes  . Smokeless tobacco: Never Used   Comment: sometimes every other day   . Alcohol Use: No    OB History    Grav Para Term Preterm Abortions TAB SAB Ect Mult Living                  Review of Systems  Constitutional: Negative for fever and chills.  HENT: Negative for neck pain.   Eyes: Negative for redness.  Respiratory: Positive for shortness of breath. Negative for cough.   Cardiovascular: Negative for chest pain, palpitations and leg swelling.  Gastrointestinal: Negative for abdominal pain.  Genitourinary: Negative for flank pain.  Musculoskeletal: Negative for back pain.  Skin: Negative for rash.  Neurological: Negative for headaches.  Hematological: Does not bruise/bleed easily.  Psychiatric/Behavioral: Negative for confusion.    Allergies  Bupropion hcl; Cyclobenzaprine hcl; Gadolinium; Levofloxacin; Lorazepam; Penicillins; Statins; and Sulfonamide derivatives  Home Medications   Current  Outpatient Rx  Name Route Sig Dispense Refill  . ADVAIR DISKUS 250-50 MCG/DOSE IN AEPB  USE ONE PUFF TWICE A DAY 180 each 1  . ASPIRIN 81 MG PO TBEC Oral Take 81 mg by mouth every morning.     . QC CALCIUM/MINERALS/VITAMIN D 600-400 MG-UNIT PO TABS Oral Take 1 tablet by mouth every evening.     Marland Kitchen EPINEPHRINE 0.3 MG/0.3ML IJ DEVI Intramuscular Inject 0.3 mLs (0.3 mg total) into the muscle once. 1 Device 2  . FLUTICASONE PROPIONATE 50 MCG/ACT NA SUSP Nasal 2 sprays by Nasal route daily as needed.      Marland Kitchen LORATADINE 10 MG PO TABS Oral Take 10 mg by mouth every morning.     Marland Kitchen  LOSARTAN POTASSIUM 25 MG PO TABS Oral Take 12.5 mg by mouth daily. If blood pressure levels are normal-patient does not take this medication    . METHIMAZOLE 5 MG PO TABS Oral Take 5 mg by mouth every morning.     Marland Kitchen NITROFURANTOIN MACROCRYSTAL 50 MG PO CAPS Oral Take 1 capsule (50 mg total) by mouth 4 (four) times daily. 28 capsule 0  . NITROFURANTOIN MACROCRYSTAL 50 MG PO CAPS Oral Take 1 capsule (50 mg total) by mouth 4 (four) times daily. 28 capsule 0  . FISH OIL CONCENTRATE 1000 MG PO CAPS Oral Take 1 capsule by mouth 2 (two) times daily.     Marland Kitchen OMEPRAZOLE 20 MG PO CPDR Oral Take 20 mg by mouth daily.    Marland Kitchen PANTOPRAZOLE SODIUM 40 MG PO TBEC  TAKE 1 TABLET (40 MG TOTAL) BY MOUTH DAILY. 90 tablet 0  . PROPRANOLOL HCL 20 MG PO TABS Oral Take 20 mg by mouth 2 (two) times daily.      Marland Kitchen PROVENTIL HFA 108 (90 BASE) MCG/ACT IN AERS  INHALE 2 PUFFS BY MOUTH EVERY 4 TO 6 HOURS AS NEEDED 20.1 g 1  . SPIRIVA HANDIHALER 18 MCG IN CAPS  INHALE 1 CAPSULE VIA HANDIHALER ONCE DAILY AT THE SAME TIME EVERY DAY 90 each 1  . TRILIPIX 135 MG PO CPDR  TAKE ONE CAPSULE BY MOUTH AT BEDTIME 90 capsule 2    BP 165/83  Pulse 125  Temp 98.4 F (36.9 C) (Oral)  Resp 32  Ht 5\' 1"  (1.549 m)  Wt 106 lb (48.081 kg)  BMI 20.03 kg/m2  SpO2 89%  Physical Exam  Nursing note and vitals reviewed. Constitutional: She is oriented to person, place, and time. She appears well-developed and well-nourished. No distress.  HENT:  Mouth/Throat: Oropharynx is clear and moist.  Eyes: Conjunctivae normal are normal. No scleral icterus.  Neck: Neck supple. No JVD present. No tracheal deviation present.  Cardiovascular: Regular rhythm, normal heart sounds and intact distal pulses.   No murmur heard. Pulmonary/Chest: She is in respiratory distress. She has wheezes.  Abdominal: Soft. Normal appearance and bowel sounds are normal. She exhibits no distension. There is no tenderness.  Genitourinary:       No cva tenderness    Musculoskeletal: She exhibits no edema and no tenderness.  Neurological: She is alert and oriented to person, place, and time.  Skin: Skin is warm and dry. No rash noted.  Psychiatric: She has a normal mood and affect.    ED Course  Procedures (including critical care time)  Results for orders placed during the hospital encounter of 11/26/11  CBC      Component Value Range   WBC 20.4 (*) 4.0 - 10.5 K/uL   RBC  3.88  3.87 - 5.11 MIL/uL   Hemoglobin 11.5 (*) 12.0 - 15.0 g/dL   HCT 16.1 (*) 09.6 - 04.5 %   MCV 91.2  78.0 - 100.0 fL   MCH 29.6  26.0 - 34.0 pg   MCHC 32.5  30.0 - 36.0 g/dL   RDW 40.9  81.1 - 91.4 %   Platelets 375  150 - 400 K/uL  COMPREHENSIVE METABOLIC PANEL      Component Value Range   Sodium 135  135 - 145 mEq/L   Potassium 3.5  3.5 - 5.1 mEq/L   Chloride 96  96 - 112 mEq/L   CO2 30  19 - 32 mEq/L   Glucose, Bld 126 (*) 70 - 99 mg/dL   BUN 16  6 - 23 mg/dL   Creatinine, Ser 7.82  0.50 - 1.10 mg/dL   Calcium 95.6  8.4 - 21.3 mg/dL   Total Protein 7.2  6.0 - 8.3 g/dL   Albumin 4.0  3.5 - 5.2 g/dL   AST 25  0 - 37 U/L   ALT 10  0 - 35 U/L   Alkaline Phosphatase 73  39 - 117 U/L   Total Bilirubin 0.4  0.3 - 1.2 mg/dL   GFR calc non Af Amer 68 (*) >90 mL/min   GFR calc Af Amer 79 (*) >90 mL/min  TROPONIN I      Component Value Range   Troponin I <0.30  <0.30 ng/mL   Dg Chest Port 1 View  11/26/2011  *RADIOLOGY REPORT*  Clinical Data: Severe shortness of breath, possible allergic reaction  PORTABLE CHEST - 1 VIEW  Comparison: 06/05/2011; 08/15/2010; 08/01/2010; chest CT day 08/15/2010  Findings: Grossly unchanged cardiac silhouette and mediastinal contours post median sternotomy.  Atherosclerotic calcifications within the aortic arch.  The lungs remain hyperexpanded with flattening of the hemidiaphragms and mild diffuse thickening of pulmonary system.  No focal airspace opacities.  No definite pleural effusion or pneumothorax.  Unchanged bones.  IMPRESSION: 1.   Hyperexpanded lungs without acute cardiopulmonary disease.  2.  Note previously described bilateral lung nodules seen on prior chest CT from 07/2010 are not well depicted on this examination.   Original Report Authenticated By: Waynard Reeds, M.D.        MDM  Iv ns. Albuterol and atrovent neb. Solumedrol iv.   Cxr, labs.  Reviewed nursing notes and prior charts for additional history.    Date: 11/26/2011  Rate: 116  Rhythm: sinus tachycardia  QRS Axis: normal  Intervals: normal  ST/T Wave abnormalities: normal  Conduction Disutrbances:none  Narrative Interpretation:   Old EKG Reviewed: unchanged   Recheck wheezing improved put persists.   Albuterol and atrovent neb.  Recheck improved air exchange. Persistent wheezing. Will call [facility] to obs/admit re wheezing/dyspnea/copd exacerbation.   Dr Orvan Falconer states obs status, med surg.           Suzi Roots, MD 11/26/11 2056  Suzi Roots, MD 11/26/11 2126

## 2011-11-26 NOTE — ED Notes (Signed)
Shortness of breath. Started getting worse around 1700 per pt. Started taking a new medication today and think that made it worse per pt. Nitrofurantoin mcr for a UTI.

## 2011-11-26 NOTE — ED Notes (Signed)
Family at bedside. Patient is comfortable at this time. 

## 2011-11-26 NOTE — H&P (Signed)
[facility] History and Physical  [name]  ZOX:[id number]  DOB: 10-31-1940   DOA: 11/26/2011   PCP:   Syliva Overman, MD   Chief Complaint:  Shortness of breath since today  HPI: [name] is an 71 y.o. female.   Elderly Caucasian lady with COPD and multiple allergies was recently diagnosed with Escherichia coli urinary tract infection, and started on nitrofurantoin today. Patient reports that his shortness of breath started with the first dose got progressively worse with each of the next 2 was consecutive doses.  She feels this medication is triggered the attack of bronchospasm, which was not relieved by her albuterol nebulizer; and her sister administered an EpiPen, which gave some relief, but she came to the emergency room for further management, was found to be wheezing diffusely., And continues to be short of breath after the several treatments in the emergency room. Hospitalist service was called to assist.  She denies fever or chills; her cough is nonproductive. She uses 2 L of oxygen at home, and is cutting down on cigarette smoking. She has not been taking steroids.  Rewiew of Systems:   All systems negative except as marked bold or noted in the HPI;  Constitutional: Negative for malaise, fever and chills. ;  Eyes: Negative for eye pain, redness and discharge. ;  ENMT: Negative for ear pain, hoarseness, nasal congestion, sinus pressure and sore throat. ;  Cardiovascular: Negative for chest pain, palpitations, diaphoresis,  peripheral edema. ;  Respiratory: Negative foremoptysis, stridor. ;  Gastrointestinal: Negative for nausea, vomiting, diarrhea, constipation, abdominal pain, melena, blood in stool, hematemesis, jaundice and rectal bleeding. unusual weight loss..   Genitourinary: Negative for frequency, dysuria, incontinence,flank pain and hematuria; Musculoskeletal: Negative for back pain and neck pain. Negative for swelling and trauma.;  Skin: . Negative for  pruritus, rash, abrasions, bruising and skin lesion.; ulcerations Neuro: Negative for headache,and neck stiffness. Negative for weakness, altered level of consciousness , altered mental status, extremity weakness, burning feet, involuntary movement, seizure and syncope.  Psych: negative for  insomnia, tearfulness, panic attacks, hallucinations, paranoia, suicidal or homicidal ideation    Past Medical History  Diagnosis Date  . Rheumatic fever   . Renal insufficiency   . Aortic stenosis   . Hyperlipidemia   . Hypertension   . COPD with chronic bronchitis   . Osteoporosis   . Sebaceous cyst   . Urosepsis 2012    required several hospitalizations for this  . Thyroid disease 2012    hyperthyroid, had abl;ation therapy treated by endo  . Anxiety     Past Surgical History  Procedure Date  . 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 Appendectomy   . Cholecystectomy   . Lumbar laminectomy   . Tonsillectomy   . Colon surgery 2005  . Breast lumpectomy   . Esophagogastroduodenoscopy 10/12/09    short segment Barrett's esophage/small hiatal hernia,antral ersions  . Colonoscopy 07/23/2008     Single diminutive polyp in the rectum status post cold biopsy removal/ Status post right hemicolectomy/ Colonic ulcers in the upstream end the colon distal to the  anastomosis as described above.  Status post biopsy. The anastomosis itself and neoterminal ileum appeared normal    Medications:  HOME MEDS: Prior to Admission medications   Medication Sig Start Date End Date Taking? Authorizing Provider  albuterol (PROVENTIL HFA) 108 (90 BASE) MCG/ACT inhaler Inhale 2 puffs into the lungs every 6 (six) hours as needed. Shortness of breath  Yes Historical Provider, MD  aspirin (ADULT ASPIRIN EC LOW STRENGTH) 81 MG EC tablet Take 81 mg by mouth every morning.    Yes Historical Provider, MD  Calcium Carbonate-Vit D-Min (QC CALCIUM/MINERALS/VITAMIN D) 600-400 MG-UNIT TABS Take 1 tablet by mouth  every evening.    Yes Historical Provider, MD  Choline Fenofibrate (TRILIPIX) 135 MG capsule Take 135 mg by mouth at bedtime.   Yes Historical Provider, MD  fluticasone (FLONASE) 50 MCG/ACT nasal spray Place 2 sprays into the nose daily as needed. Sinus/congestion   Yes Historical Provider, MD  Fluticasone-Salmeterol (ADVAIR) 250-50 MCG/DOSE AEPB Inhale 1 puff into the lungs 2 (two) times daily.   Yes Historical Provider, MD  loratadine (CLARITIN) 10 MG tablet Take 10 mg by mouth every morning.    Yes Historical Provider, MD  losartan (COZAAR) 25 MG tablet Take 12.5 mg by mouth daily. If blood pressure levels are normal-patient does not take this medication 10/26/10  Yes Kerri Perches, MD  methimazole (TAPAZOLE) 5 MG tablet Take 5 mg by mouth every morning.    Yes Historical Provider, MD  Omega-3 Fatty Acids (FISH OIL CONCENTRATE) 1000 MG CAPS Take 1 capsule by mouth 2 (two) times daily.    Yes Historical Provider, MD  omeprazole (PRILOSEC) 20 MG capsule Take 20 mg by mouth daily.   Yes Historical Provider, MD  propranolol (INDERAL) 20 MG tablet Take 20 mg by mouth 2 (two) times daily.     Yes Historical Provider, MD  tiotropium (SPIRIVA) 18 MCG inhalation capsule Place 18 mcg into inhaler and inhale daily.   Yes Historical Provider, MD  EPINEPHrine (EPI-PEN) 0.3 mg/0.3 mL DEVI Inject 0.3 mLs (0.3 mg total) into the muscle once. 11/25/11 11/24/12  Kerri Perches, MD     Allergies:  Allergies  Allergen Reactions  . Bupropion Hcl   . Cyclobenzaprine Hcl   . Gadolinium      Desc: PT REFUSES MR & CT CONTRAST DUE TO PRIOR REACTION, PT STATES HAIR FELL OUT & SKIN CHANGED COLOR   . Levofloxacin   . Lorazepam     Causes mental confusion/breakdown  . Penicillins Other (See Comments)    Child hood allergy  . Statins Other (See Comments)    Joint stiffness  . Sulfonamide Derivatives     Social History:   reports that she has been smoking Cigarettes.  She has a 25 pack-year smoking  history. She has never used smokeless tobacco. She reports that she does not drink alcohol or use illicit drugs.  Family History: Family History  Problem Relation Age of Onset  . Heart attack Mother 2  . Stroke Father 21  . Hypertension Sister     2 sisters     Physical Exam: Filed Vitals:   11/26/11 1951 11/26/11 2023 11/26/11 2050 11/26/11 2131  BP:   141/82   Pulse:   113   Temp:      TempSrc:      Resp:   22   Height:      Weight:      SpO2: 96% 96% 100% 96%   Blood pressure 141/82, pulse 113, temperature 98.6 F (37 C), temperature source Oral, resp. rate 22, height 5\' 1"  (1.549 m), weight 48.081 kg (106 lb), SpO2 96.00%.  GEN:  Pleasant thin Caucasian lady sitting up in the stretcher mild respiratory distress; cooperative with exam PSYCH:  alert and oriented x4; ; affect is appropriate. HEENT: Mucous membranes pink and anicteric; PERRLA; EOM intact; no cervical  lymphadenopathy  Breasts:: Not examined CHEST WALL: No tenderness CHEST: Tachypneic, diffuse bilateral wheezing HEART: Tachycardic regular rhythm; no murmurs rubs or gallops BACK: Mild kyphosis ; no CVA tenderness ABDOMEN:  soft non-tender; no masses, no organomegaly, normal abdominal bowel sounds; no pannus; no intertriginous candida. Rectal Exam: Not done EXTREMITIES:  age-appropriate arthropathy of the hands and knees; no edema; no ulcerations. Genitalia: not examined PULSES: 2+ and symmetric SKIN: Normal hydration no rash or ulceration CNS: Cranial nerves 2-12 grossly intact no focal lateralizing neurologic deficit   Labs on Admission:  Basic Metabolic Panel:  Lab 11/26/11 1610  NA 135  K 3.5  CL 96  CO2 30  GLUCOSE 126*  BUN 16  CREATININE 0.85  CALCIUM 10.4  MG --  PHOS --   Liver Function Tests:  Lab 11/26/11 1924  AST 25  ALT 10  ALKPHOS 73  BILITOT 0.4  PROT 7.2  ALBUMIN 4.0   No results found for this basename: LIPASE:5,AMYLASE:5 in the last 168 hours No results found for  this basename: AMMONIA:5 in the last 168 hours CBC:  Lab 11/26/11 1924  WBC 20.4*  NEUTROABS --  HGB 11.5*  HCT 35.4*  MCV 91.2  PLT 375   Cardiac Enzymes:  Lab 11/26/11 1924  CKTOTAL --  CKMB --  CKMBINDEX --  TROPONINI <0.30   BNP: No components found with this basename: POCBNP:5 D-dimer: No components found with this basename: D-DIMER:5 CBG: No results found for this basename: GLUCAP:5 in the last 168 hours  Radiological Exams on Admission: Dg Chest Port 1 View  11/26/2011  *RADIOLOGY REPORT*  Clinical Data: Severe shortness of breath, possible allergic reaction  PORTABLE CHEST - 1 VIEW  Comparison: 06/05/2011; 08/15/2010; 08/01/2010; chest CT day 08/15/2010  Findings: Grossly unchanged cardiac silhouette and mediastinal contours post median sternotomy.  Atherosclerotic calcifications within the aortic arch.  The lungs remain hyperexpanded with flattening of the hemidiaphragms and mild diffuse thickening of pulmonary system.  No focal airspace opacities.  No definite pleural effusion or pneumothorax.  Unchanged bones.  IMPRESSION: 1.  Hyperexpanded lungs without acute cardiopulmonary disease.  2.  Note previously described bilateral lung nodules seen on prior chest CT from 07/2010 are not well depicted on this examination.   Original Report Authenticated By: Waynard Reeds, M.D.       Assessment/Plan Present on Admission:   .COPD with exacerbation, possibly aggravated by a drug allergy .Leukocytosis, possibly secondary to EpiPen use . E. coli UTI (urinary tract infection), incompletely treated  .NICOTINE ADDICTION .Hyperthyroidism  .HYPERLIPIDEMIA   PLAN: We'll bring this lady on observation for serial nebulizations; will give steroids, but no respiratory antibiotics because of her multiple allergies. If she is not improving by tomorrow consider adding Zithromax.  Will give single dose of fosfomycin  for her urinary tract infection, and she'll be monitored in  hospital so this will be relatively safe.  Continue management of her chronic medical conditions   Code Status:FULL CODE  Family Communication: Plans discussed with patient and her sister, present at the interview and exam Disposition Plan: Possibly home tomorrow with a steroid taper.   [name] Nocturnist [facility] Pager [phone]   11/26/2011, 10:58 PM

## 2011-11-26 NOTE — ED Notes (Signed)
RN at bedside

## 2011-11-26 NOTE — ED Notes (Signed)
Pt up to in room bathroom, SOB with exertion. Breathing treatment initiated at this time.

## 2011-11-27 DIAGNOSIS — R062 Wheezing: Secondary | ICD-10-CM | POA: Diagnosis not present

## 2011-11-27 DIAGNOSIS — N390 Urinary tract infection, site not specified: Secondary | ICD-10-CM | POA: Diagnosis not present

## 2011-11-27 DIAGNOSIS — J441 Chronic obstructive pulmonary disease with (acute) exacerbation: Secondary | ICD-10-CM | POA: Diagnosis not present

## 2011-11-27 LAB — COMPREHENSIVE METABOLIC PANEL
Albumin: 3.3 g/dL — ABNORMAL LOW (ref 3.5–5.2)
BUN: 17 mg/dL (ref 6–23)
Creatinine, Ser: 0.85 mg/dL (ref 0.50–1.10)
GFR calc Af Amer: 79 mL/min — ABNORMAL LOW (ref >90)
GFR calc non Af Amer: 68 mL/min — ABNORMAL LOW (ref >90)
Potassium: 3.9 mEq/L (ref 3.5–5.1)
Total Bilirubin: 0.4 mg/dL (ref 0.3–1.2)
Total Protein: 6.4 g/dL (ref 6.0–8.3)

## 2011-11-27 LAB — CBC
HCT: 30.6 % — ABNORMAL LOW (ref 36.0–46.0)
MCHC: 32.7 g/dL (ref 30.0–36.0)
MCV: 90.5 fL (ref 78.0–100.0)
Platelets: 342 10*3/uL (ref 150–400)
RDW: 13.9 % (ref 11.5–15.5)

## 2011-11-27 LAB — TSH: TSH: 3.065 u[IU]/mL (ref 0.350–4.500)

## 2011-11-27 MED ORDER — PREDNISONE 20 MG PO TABS: ORAL_TABLET | ORAL | Status: DC

## 2011-11-27 MED ORDER — SODIUM CHLORIDE 0.9 % IJ SOLN
INTRAMUSCULAR | Status: AC
  Administered 2011-11-27: 10 mL
  Filled 2011-11-27: qty 3

## 2011-11-27 MED ORDER — BIOTENE DRY MOUTH MT LIQD
15.0000 mL | Freq: Two times a day (BID) | OROMUCOSAL | Status: DC
  Administered 2011-11-27: 15 mL via OROMUCOSAL

## 2011-11-27 NOTE — Care Management Note (Signed)
    Page 1 of 1   11/27/2011     1:48:22 PM   CARE MANAGEMENT NOTE 11/27/2011  Patient:  [name], [name]   Account Number:  [id number]  Date Initiated:  11/27/2011  Documentation initiated by:  Rosemary Holms  Subjective/Objective Assessment:   Pt admitted from home where she lives with her sister. Admitted with COPD exacerbation. Pt is on continuous O2 at home.     Action/Plan:   Sister will bring O2 canister from home for DC.   Anticipated DC Date:  11/27/2011   Anticipated DC Plan:  HOME/SELF CARE      DC Planning Services  CM consult      Choice offered to / List presented to:             Status of service:  Completed, signed off Medicare Important Message given?   (If response is "NO", the following Medicare IM given date fields will be blank) Date Medicare IM given:   Date Additional Medicare IM given:    Discharge Disposition:  HOME/SELF CARE  Per UR Regulation:    If discussed at Long Length of Stay Meetings, dates discussed:    Comments:  11/27/11 1130 Brittie Whisnant Leanord Hawking RN BSN CM

## 2011-11-27 NOTE — Discharge Summary (Signed)
Physician Discharge Summary  [name] ZOX:[id number] DOB: [birth year] DOA: 11/26/2011  PCP: Syliva Overman, MD  Admit date: 11/26/2011 Discharge date: 11/27/2011  Recommendations for Outpatient Follow-up:  1. Follow with primary care physician in a week for repeat urine culture.   Discharge Diagnoses:  1. Acute bronchospasm/COPD exacerbation secondary to nitrofurantoin. 2. Tobacco abuse. 3. Recent UTI, however colony count was rather small. 4. Hypothyroidism, on treatment.   Discharge Condition: Improved and stable.  Diet recommendation: Regular.  Filed Weights   11/26/11 1853 11/26/11 2343 11/27/11 0554  Weight: 48.081 kg (106 lb) 45.7 kg (100 lb 12 oz) 45.8 kg (100 lb 15.5 oz)    History of present illness:  This 71 year old lady was admitted last night with symptoms of dyspnea. Please see initial history as outlined below, [name] is an 71 y.o. female. Elderly Caucasian lady with COPD and multiple allergies was recently diagnosed with Escherichia coli urinary tract infection, and started on nitrofurantoin today. Patient reports that his shortness of breath started with the first dose got progressively worse with each of the next 2 was consecutive doses.  She feels this medication is triggered the attack of bronchospasm, which was not relieved by her albuterol nebulizer; and her sister administered an EpiPen, which gave some relief, but she came to the emergency room for further management, was found to be wheezing diffusely., And continues to be short of breath after the several treatments in the emergency room. Hospitalist service was called to assist.  She denies fever or chills; her cough is nonproductive.  She uses 2 L of oxygen at home, and is cutting down on cigarette smoking.  She has not been taking steroids.  Hospital Course:  Patient was admitted overnight, she was given intravenous steroids. This morning she feels back to her baseline. She feels well enough to  go home now. Her urinary symptoms have resolved for the time being and I have not started her on a new antibiotics. I will list nitrofurantoin as an allergy.  Procedures:  None.  Consultations:  None.  Discharge Exam: Filed Vitals:   11/26/11 2334 11/26/11 2343 11/27/11 0554 11/27/11 0740  BP: 119/71  110/61   Pulse: 111  73   Temp: 98 F (36.7 C)  98 F (36.7 C)   TempSrc: Oral  Oral   Resp: 20  18   Height:  5\' 1"  (1.549 m)    Weight:  45.7 kg (100 lb 12 oz) 45.8 kg (100 lb 15.5 oz)   SpO2: 100%  99% 93%    General: She looks systemically well. She is not dyspneic at rest and she does not have increased work of breathing. Cardiovascular: Heart sounds are present and normal without murmurs. Respiratory: Lung fields show scattered wheezing which I think is her baseline. She is alert and orientated.  Discharge Instructions  Discharge Orders    Future Appointments: Provider: Department: Dept Phone: Center:   11/29/2011 11:00 AM Ap-Ct 1 [facility] [phone] [place] [facility]     Future Orders Please Complete By Expires   Diet - low sodium heart healthy      Increase activity slowly          Medication List     As of 11/27/2011  8:51 AM    STOP taking these medications         nitrofurantoin 50 MG capsule   Commonly known as: MACRODANTIN      TAKE these medications  ADULT ASPIRIN EC LOW STRENGTH 81 MG EC tablet   Generic drug: aspirin   Take 81 mg by mouth every morning.      EPINEPHrine 0.3 mg/0.3 mL Devi   Commonly known as: EPI-PEN   Inject 0.3 mLs (0.3 mg total) into the muscle once.      FISH OIL CONCENTRATE 1000 MG Caps   Take 1 capsule by mouth 2 (two) times daily.      FLONASE 50 MCG/ACT nasal spray   Generic drug: fluticasone   Place 2 sprays into the nose daily as needed. Sinus/congestion      Fluticasone-Salmeterol 250-50 MCG/DOSE Aepb   Commonly known as: ADVAIR   Inhale 1 puff into the lungs 2 (two) times daily.      loratadine 10  MG tablet   Commonly known as: CLARITIN   Take 10 mg by mouth every morning.      losartan 25 MG tablet   Commonly known as: COZAAR   Take 12.5 mg by mouth daily. If blood pressure levels are normal-patient does not take this medication      methimazole 5 MG tablet   Commonly known as: TAPAZOLE   Take 5 mg by mouth every morning.      omeprazole 20 MG capsule   Commonly known as: PRILOSEC   Take 20 mg by mouth daily.      predniSONE 20 MG tablet   Commonly known as: DELTASONE   Take 2 tablets for 2 days, then 1 tablet for 2 days, then half tablet for 2 days, then STOP.      propranolol 20 MG tablet   Commonly known as: INDERAL   Take 20 mg by mouth 2 (two) times daily.      PROVENTIL HFA 108 (90 BASE) MCG/ACT inhaler   Generic drug: albuterol   Inhale 2 puffs into the lungs every 6 (six) hours as needed. Shortness of breath      QC CALCIUM/MINERALS/VITAMIN D 600-400 MG-UNIT Tabs   Take 1 tablet by mouth every evening.      tiotropium 18 MCG inhalation capsule   Commonly known as: SPIRIVA   Place 18 mcg into inhaler and inhale daily.      TRILIPIX 135 MG capsule   Generic drug: Choline Fenofibrate   Take 135 mg by mouth at bedtime.          The results of significant diagnostics from this hospitalization (including imaging, microbiology, ancillary and laboratory) are listed below for reference.    Significant Diagnostic Studies: Dg Chest Port 1 View  11/26/2011  *RADIOLOGY REPORT*  Clinical Data: Severe shortness of breath, possible allergic reaction  PORTABLE CHEST - 1 VIEW  Comparison: 06/05/2011; 08/15/2010; 08/01/2010; chest CT day 08/15/2010  Findings: Grossly unchanged cardiac silhouette and mediastinal contours post median sternotomy.  Atherosclerotic calcifications within the aortic arch.  The lungs remain hyperexpanded with flattening of the hemidiaphragms and mild diffuse thickening of pulmonary system.  No focal airspace opacities.  No definite pleural  effusion or pneumothorax.  Unchanged bones.  IMPRESSION: 1.  Hyperexpanded lungs without acute cardiopulmonary disease.  2.  Note previously described bilateral lung nodules seen on prior chest CT from 07/2010 are not well depicted on this examination.   Original Report Authenticated By: Waynard Reeds, M.D.     Microbiology: Recent Results (from the past 240 hour(s))  URINE CULTURE     Status: Normal   Collection Time   11/22/11  3:53 PM  Component Value Range Status Comment   Culture ESCHERICHIA COLI   Final    Colony Count 90,000 COLONIES/ML   Final    Organism ID, Bacteria ESCHERICHIA COLI   Final      Labs: Basic Metabolic Panel:  Lab 11/27/11 0981 11/26/11 1924  NA 135 135  K 3.9 3.5  CL 99 96  CO2 27 30  GLUCOSE 152* 126*  BUN 17 16  CREATININE 0.85 0.85  CALCIUM 9.8 10.4  MG -- --  PHOS -- --   Liver Function Tests:  Lab 11/27/11 0456 11/26/11 1924  AST 20 25  ALT 7 10  ALKPHOS 63 73  BILITOT 0.4 0.4  PROT 6.4 7.2  ALBUMIN 3.3* 4.0     CBC:  Lab 11/27/11 0456 11/26/11 1924  WBC 24.0* 20.4*  NEUTROABS -- --  HGB 10.0* 11.5*  HCT 30.6* 35.4*  MCV 90.5 91.2  PLT 342 375   Cardiac Enzymes:  Lab 11/26/11 1924  CKTOTAL --  CKMB --  CKMBINDEX --  TROPONINI <0.30     Time coordinating discharge: *Greater than 30 minutes  Signed:  Muaz Shorey C  [facility] 11/27/2011, 8:51 AM

## 2011-11-27 NOTE — Progress Notes (Signed)
Discharge instructions and prescription given, verbalized understanding, out via w/c in stable condition with staff.

## 2011-11-28 ENCOUNTER — Telehealth: Payer: Self-pay | Admitting: Family Medicine

## 2011-11-28 DIAGNOSIS — N390 Urinary tract infection, site not specified: Secondary | ICD-10-CM

## 2011-11-28 NOTE — Telephone Encounter (Signed)
Please make pt aware of the fact that i know she had and allergic reaction to nitrofurantoin and was in the hospital. She needs to bring stop by the office this Friday, 10/11 and resubmit urine for c/s ONLY, or she may leave it at the lab on Saturday the 12 if she prefers. Ensure order gets to lab please, thanks  I need to f/u on the c/s the week of 10/14, please explain to her

## 2011-11-29 ENCOUNTER — Other Ambulatory Visit: Payer: Self-pay

## 2011-11-29 ENCOUNTER — Ambulatory Visit (HOSPITAL_COMMUNITY): Payer: Medicare Other

## 2011-11-29 MED ORDER — PANTOPRAZOLE SODIUM 40 MG PO TBEC: 40.0000 mg | DELAYED_RELEASE_TABLET | Freq: Every day | ORAL | Status: DC

## 2011-11-29 NOTE — Telephone Encounter (Signed)
Called patient and left message for them to return call at the office   

## 2011-12-05 ENCOUNTER — Ambulatory Visit (HOSPITAL_COMMUNITY): Payer: Medicare Other

## 2011-12-05 ENCOUNTER — Telehealth: Payer: Self-pay | Admitting: Family Medicine

## 2011-12-05 ENCOUNTER — Ambulatory Visit: Payer: Medicare Other | Admitting: Family Medicine

## 2011-12-06 NOTE — Telephone Encounter (Signed)
Called patient and left message for them to return call at the office   

## 2011-12-07 NOTE — Telephone Encounter (Signed)
SPOKE WITH PT  SHE IS GOING TO COME BY TO SUBMIT URINE TO BE SENT FOR C/S.

## 2011-12-07 NOTE — Telephone Encounter (Signed)
Noted, will f/u

## 2011-12-07 NOTE — Telephone Encounter (Signed)
Patient will need a follow up on urine c/s next week. Should have submitted specimen earlier in the week, has multiple drug allergies, has in the past been hospitalized with severe UTi, recently was hospitalized with allergic reaction to an antibiotic I had recently started her on

## 2011-12-08 DIAGNOSIS — N390 Urinary tract infection, site not specified: Secondary | ICD-10-CM | POA: Diagnosis not present

## 2011-12-08 NOTE — Addendum Note (Signed)
Addended by: Kandis Fantasia B on: 12/08/2011 03:56 PM   Modules accepted: Orders

## 2011-12-08 NOTE — Telephone Encounter (Signed)
Patient's family member brought in urine specimen in an unsterile cup.  Called and left message for patient notifying that specimen could not be used and would need to re submit.

## 2011-12-08 NOTE — Telephone Encounter (Signed)
Patient aware that she needs to re submit urine and she will go to the lab to do this.  Lab order faxed.

## 2011-12-08 NOTE — Telephone Encounter (Signed)
Spoke with patient and she is to come to the office to resubmit a urine specimen

## 2011-12-11 ENCOUNTER — Telehealth: Payer: Self-pay | Admitting: Family Medicine

## 2011-12-13 ENCOUNTER — Encounter: Payer: Self-pay | Admitting: Family Medicine

## 2011-12-14 LAB — URINE CULTURE

## 2011-12-28 NOTE — Telephone Encounter (Signed)
I am assuming that the nurse is taking care of this

## 2012-01-02 NOTE — Telephone Encounter (Signed)
Looks like at her last hospital admission it states that she was on continuous oxygen at home. I have not heard back from [facility] so if they still have a question and call back I will address.

## 2012-02-05 ENCOUNTER — Other Ambulatory Visit: Payer: Self-pay | Admitting: Family Medicine

## 2012-02-23 ENCOUNTER — Other Ambulatory Visit: Payer: Self-pay | Admitting: Family Medicine

## 2012-02-26 ENCOUNTER — Other Ambulatory Visit: Payer: Self-pay | Admitting: Family Medicine

## 2012-02-27 ENCOUNTER — Other Ambulatory Visit: Payer: Self-pay

## 2012-02-27 ENCOUNTER — Telehealth: Payer: Self-pay | Admitting: Family Medicine

## 2012-02-27 ENCOUNTER — Other Ambulatory Visit: Payer: Self-pay | Admitting: Family Medicine

## 2012-02-27 MED ORDER — PROPRANOLOL HCL 20 MG PO TABS: 20.0000 mg | ORAL_TABLET | Freq: Two times a day (BID) | ORAL | Status: DC

## 2012-02-27 MED ORDER — METHIMAZOLE 5 MG PO TABS: 5.0000 mg | ORAL_TABLET | ORAL | Status: DC

## 2012-02-27 NOTE — Telephone Encounter (Signed)
Sent in requested refills. Also was wanting refill on combivent but I don't see this on her medlist.

## 2012-02-27 NOTE — Telephone Encounter (Signed)
combivent entered historically, please send and let her know along with the others she requests. Let her know I hope she is staying warm and keeping well, thanks

## 2012-02-28 ENCOUNTER — Other Ambulatory Visit: Payer: Self-pay

## 2012-02-28 MED ORDER — IPRATROPIUM-ALBUTEROL 20-100 MCG/ACT IN AERS: 1.0000 | INHALATION_SPRAY | Freq: Four times a day (QID) | RESPIRATORY_TRACT | Status: DC

## 2012-02-28 NOTE — Telephone Encounter (Signed)
Pt aware to check with her pharmacy later

## 2012-03-29 ENCOUNTER — Other Ambulatory Visit: Payer: Self-pay | Admitting: Family Medicine

## 2012-05-16 ENCOUNTER — Emergency Department (HOSPITAL_COMMUNITY): Payer: Medicare Other

## 2012-05-16 ENCOUNTER — Telehealth: Payer: Self-pay | Admitting: Family Medicine

## 2012-05-16 ENCOUNTER — Encounter (HOSPITAL_COMMUNITY): Payer: Self-pay

## 2012-05-16 ENCOUNTER — Emergency Department (HOSPITAL_COMMUNITY)
Admission: EM | Admit: 2012-05-16 | Discharge: 2012-05-16 | Disposition: A | Discharge status: Home / Self Care | Payer: Medicare Other | Attending: Emergency Medicine | Admitting: Emergency Medicine

## 2012-05-16 DIAGNOSIS — E860 Dehydration: Secondary | ICD-10-CM | POA: Insufficient documentation

## 2012-05-16 DIAGNOSIS — Z8739 Personal history of other diseases of the musculoskeletal system and connective tissue: Secondary | ICD-10-CM | POA: Insufficient documentation

## 2012-05-16 DIAGNOSIS — Z862 Personal history of diseases of the blood and blood-forming organs and certain disorders involving the immune mechanism: Secondary | ICD-10-CM | POA: Diagnosis not present

## 2012-05-16 DIAGNOSIS — R110 Nausea: Secondary | ICD-10-CM | POA: Insufficient documentation

## 2012-05-16 DIAGNOSIS — Z872 Personal history of diseases of the skin and subcutaneous tissue: Secondary | ICD-10-CM | POA: Diagnosis not present

## 2012-05-16 DIAGNOSIS — R5381 Other malaise: Secondary | ICD-10-CM | POA: Diagnosis not present

## 2012-05-16 DIAGNOSIS — I10 Essential (primary) hypertension: Secondary | ICD-10-CM | POA: Diagnosis not present

## 2012-05-16 DIAGNOSIS — R42 Dizziness and giddiness: Secondary | ICD-10-CM | POA: Diagnosis not present

## 2012-05-16 DIAGNOSIS — F17200 Nicotine dependence, unspecified, uncomplicated: Secondary | ICD-10-CM | POA: Diagnosis not present

## 2012-05-16 DIAGNOSIS — J449 Chronic obstructive pulmonary disease, unspecified: Secondary | ICD-10-CM | POA: Diagnosis not present

## 2012-05-16 DIAGNOSIS — R0602 Shortness of breath: Secondary | ICD-10-CM | POA: Diagnosis not present

## 2012-05-16 DIAGNOSIS — F411 Generalized anxiety disorder: Secondary | ICD-10-CM | POA: Diagnosis not present

## 2012-05-16 DIAGNOSIS — Z7982 Long term (current) use of aspirin: Secondary | ICD-10-CM | POA: Diagnosis not present

## 2012-05-16 DIAGNOSIS — Z87891 Personal history of nicotine dependence: Secondary | ICD-10-CM | POA: Insufficient documentation

## 2012-05-16 DIAGNOSIS — R630 Anorexia: Secondary | ICD-10-CM | POA: Insufficient documentation

## 2012-05-16 DIAGNOSIS — Z8679 Personal history of other diseases of the circulatory system: Secondary | ICD-10-CM | POA: Diagnosis not present

## 2012-05-16 DIAGNOSIS — J4489 Other specified chronic obstructive pulmonary disease: Secondary | ICD-10-CM | POA: Insufficient documentation

## 2012-05-16 DIAGNOSIS — R197 Diarrhea, unspecified: Secondary | ICD-10-CM | POA: Insufficient documentation

## 2012-05-16 DIAGNOSIS — Z87448 Personal history of other diseases of urinary system: Secondary | ICD-10-CM | POA: Insufficient documentation

## 2012-05-16 DIAGNOSIS — Z79899 Other long term (current) drug therapy: Secondary | ICD-10-CM | POA: Diagnosis not present

## 2012-05-16 DIAGNOSIS — E785 Hyperlipidemia, unspecified: Secondary | ICD-10-CM | POA: Diagnosis not present

## 2012-05-16 DIAGNOSIS — Z8639 Personal history of other endocrine, nutritional and metabolic disease: Secondary | ICD-10-CM | POA: Insufficient documentation

## 2012-05-16 LAB — BASIC METABOLIC PANEL
Chloride: 96 mEq/L (ref 96–112)
Creatinine, Ser: 0.66 mg/dL (ref 0.50–1.10)
GFR calc Af Amer: >90 mL/min (ref >90)
GFR calc non Af Amer: 87 mL/min — ABNORMAL LOW (ref >90)
Potassium: 3.8 mEq/L (ref 3.5–5.1)

## 2012-05-16 LAB — CBC WITH DIFFERENTIAL/PLATELET
Basophils Absolute: 0.1 10*3/uL (ref 0.0–0.1)
Basophils Relative: 1 % (ref 0–1)
MCHC: 31.6 g/dL (ref 30.0–36.0)
Neutro Abs: 5.5 10*3/uL (ref 1.7–7.7)
Neutrophils Relative %: 70 % (ref 43–77)
RDW: 13.6 % (ref 11.5–15.5)

## 2012-05-16 LAB — URINALYSIS, ROUTINE W REFLEX MICROSCOPIC
Nitrite: NEGATIVE
Protein, ur: NEGATIVE mg/dL
Specific Gravity, Urine: >1.03 — ABNORMAL HIGH (ref 1.005–1.030)
Urobilinogen, UA: 0.2 mg/dL (ref 0.0–1.0)

## 2012-05-16 LAB — URINE MICROSCOPIC-ADD ON

## 2012-05-16 LAB — TROPONIN I: Troponin I: <0.3 ng/mL (ref <0.30)

## 2012-05-16 MED ORDER — SODIUM CHLORIDE 0.9 % IV BOLUS (SEPSIS)
500.0000 mL | Freq: Once | INTRAVENOUS | Status: AC
  Administered 2012-05-16: 500 mL via INTRAVENOUS

## 2012-05-16 MED ORDER — SODIUM CHLORIDE 0.9 % IV SOLN
INTRAVENOUS | Status: DC
  Administered 2012-05-16: 14:00:00 via INTRAVENOUS

## 2012-05-16 NOTE — Telephone Encounter (Signed)
noted 

## 2012-05-16 NOTE — Telephone Encounter (Signed)
Spoke with sister and she stated that patient's o2 was at 77 two nights ago.  And that she is not currently with patient.  She states that she has since gotten patient to wear her oxygen and to increase fluids.  Is going to go check on patient and call office back.

## 2012-05-16 NOTE — Telephone Encounter (Signed)
Received return call from sister of patient.  She advised that patient has labored breathing with oxygen on and is sating at 89%.  Spoke with patient and advised her to go to the Ed for treatment.  Patient states that she will go to the ED.

## 2012-05-16 NOTE — ED Notes (Signed)
Patient's family forgot to bring patient's home oxygen. They are going to run home and get it prior to patient discharge.

## 2012-05-16 NOTE — ED Provider Notes (Signed)
History  This chart was scribed for Flint Melter, MD by Shari Heritage, ED Scribe. The patient was seen in room APA17/APA17. Patient's care was started at 1153.   CSN: [id number]  Arrival date & time 05/16/12  1036   First MD Initiated Contact with Patient 05/16/12 1153      Chief Complaint  Patient presents with  . Shortness of Breath  . Weakness    The history is provided by the patient. No language interpreter was used.    HPI Comments: ALEANA FIFITA is a 72 y.o. female with history of COPD, hypertension, aortic stenosis and hyperlipidemia who presents to the Emergency Department complaining of constant, generalized weakness for the past several weeks. Patient states that she has had increased difficulty with ADL. Patient also complains of increased shortness of breath. There is associated nausea, decreased appetite, decreased food intake, sporadic diarrhea and occasional dizziness. Patient says that she hasn't had an appetite for 6-8 weeks and food induces nausea. Patient is on 2L of oxygen at home. Patient's last visit to her PCP was several months ago, but patient states that they had a disagreement about treatment. Patient is currently taking several medicines prescribed by Dr. [name]. Patient is a smoker, but claims she quit a couple of months ago. Patient states that she has had atrial fibrillation before. Patient is not taking any anti-coagulants at this time.  PCP - [name]   Past Medical History  Diagnosis Date  . Rheumatic fever   . Renal insufficiency   . Aortic stenosis   . Hyperlipidemia   . Hypertension   . COPD with chronic bronchitis   . Osteoporosis   . Sebaceous cyst   . Urosepsis 2012    required several hospitalizations for this  . Thyroid disease 2012    hyperthyroid, had abl;ation therapy treated by endo  . Anxiety     Past Surgical History  Procedure Laterality Date  . 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 Appendectomy    .  Cholecystectomy    . Lumbar laminectomy    . Tonsillectomy    . Colon surgery  2005  . Breast lumpectomy    . Esophagogastroduodenoscopy  10/12/09    short segment Barrett's esophage/small hiatal hernia,antral ersions  . Colonoscopy  07/23/2008     Single diminutive polyp in the rectum status post cold biopsy removal/ Status post right hemicolectomy/ Colonic ulcers in the upstream end the colon distal to the  anastomosis as described above.  Status post biopsy. The anastomosis itself and neoterminal ileum appeared normal    Family History  Problem Relation Age of Onset  . Heart attack Mother 6  . Stroke Father 19  . Hypertension Sister     2 sisters    History  Substance Use Topics  . Smoking status: Smoker, Current Status Unknown -- 0.50 packs/day for 50 years    Types: Cigarettes  . Smokeless tobacco: Current User     Comment: sometimes every other day   . Alcohol Use: No    OB History   Grav Para Term Preterm Abortions TAB SAB Ect Mult Living                  Review of Systems A complete 10 system review of systems was obtained and all systems are negative except as noted in the HPI and PMH.   Allergies  Nitrofurantoin; Bupropion hcl; Cyclobenzaprine hcl; Gadolinium; Levofloxacin; Lorazepam; Penicillins; Statins; and Sulfonamide  derivatives  Home Medications   Current Outpatient Rx  Name  Route  Sig  Dispense  Refill  . albuterol (PROVENTIL HFA) 108 (90 BASE) MCG/ACT inhaler   Inhalation   Inhale 2 puffs into the lungs every 6 (six) hours as needed. Shortness of breath         . aspirin (ADULT ASPIRIN EC LOW STRENGTH) 81 MG EC tablet   Oral   Take 81 mg by mouth every morning.          . Calcium Carbonate-Vit D-Min (QC CALCIUM/MINERALS/VITAMIN D) 600-400 MG-UNIT TABS   Oral   Take 1 tablet by mouth every evening.          . Choline Fenofibrate (TRILIPIX) 135 MG capsule   Oral   Take 135 mg by mouth at bedtime.         Marland Kitchen EPINEPHrine (EPI-PEN) 0.3  mg/0.3 mL DEVI   Intramuscular   Inject 0.3 mLs (0.3 mg total) into the muscle once.   1 Device   2   . fluticasone (FLONASE) 50 MCG/ACT nasal spray   Nasal   Place 2 sprays into the nose daily as needed. Sinus/congestion         . Fluticasone-Salmeterol (ADVAIR) 250-50 MCG/DOSE AEPB   Inhalation   Inhale 1 puff into the lungs 2 (two) times daily.         . Ipratropium-Albuterol (COMBIVENT) 20-100 MCG/ACT AERS respimat   Inhalation   Inhale 1 puff into the lungs every 6 (six) hours.   1 Inhaler   5   . lansoprazole (PREVACID) 15 MG capsule   Oral   Take 15 mg by mouth daily.         Marland Kitchen loratadine (CLARITIN) 10 MG tablet   Oral   Take 10 mg by mouth every morning.          . methimazole (TAPAZOLE) 5 MG tablet   Oral   Take 1 tablet (5 mg total) by mouth every morning.   30 tablet   3   . Omega-3 Fatty Acids (FISH OIL CONCENTRATE) 1000 MG CAPS   Oral   Take 1 capsule by mouth 2 (two) times daily.          . pantoprazole (PROTONIX) 40 MG tablet   Oral   Take 1 tablet (40 mg total) by mouth daily.   90 tablet   0   . propranolol (INDERAL) 20 MG tablet   Oral   Take 1 tablet (20 mg total) by mouth 2 (two) times daily.   60 tablet   3   . tiotropium (SPIRIVA) 18 MCG inhalation capsule   Inhalation   Place 18 mcg into inhaler and inhale daily.         . vitamin C (ASCORBIC ACID) 500 MG tablet   Oral   Take 500 mg by mouth daily.           Physical Exam  Nursing note and vitals reviewed. Constitutional: She is oriented to person, place, and time. She appears well-developed and well-nourished.  HENT:  Head: Normocephalic and atraumatic.  Right Ear: External ear normal.  Left Ear: External ear normal.  Mouth/Throat: No oral lesions.  Eyes: Conjunctivae and EOM are normal. Pupils are equal, round, and reactive to light.  Neck: Normal range of motion and phonation normal. Neck supple.  Cardiovascular: Normal rate, normal heart sounds and intact  distal pulses.  An irregular rhythm present.  HR 72.  Pulmonary/Chest: Effort normal. She has  wheezes. She has rhonchi. She exhibits no bony tenderness.  Decreased air movement bilaterally over scattered rhonchi and wheezes.  Abdominal: Soft. Normal appearance. There is no tenderness.  Musculoskeletal: Normal range of motion.       Left foot: She exhibits swelling.  Swelling in ecchymotic lateral foot and ankle. No lower leg swelling.  Neurological: She is alert and oriented to person, place, and time. She has normal strength. No cranial nerve deficit or sensory deficit. She exhibits normal muscle tone. Coordination normal.  Skin: Skin is warm, dry and intact.  Psychiatric: She has a normal mood and affect. Her behavior is normal. Judgment and thought content normal.    ED Course  Procedures (including critical care time) DIAGNOSTIC STUDIES: Oxygen Saturation is 93% on room air, adequate by my interpretation.      Date: 12/08/2011  Rate: 106  Rhythm: sinus tachycardia  QRS Axis: normal  PR and QT Intervals: normal  ST/T Wave abnormalities: early repolarization  PR and QRS Conduction Disutrbances:none  Narrative Interpretation:   Old EKG Reviewed: unchanged- 11/26/11   COORDINATION OF CARE: 12:20 PM- Sister-in-law is bedside. Patient informed of current plan for treatment and evaluation and agrees with plan at this time.   Wt Readings from Last 3 Encounters:  05/16/12 100 lb (45.36 kg)  11/27/11 100 lb 15.5 oz (45.8 kg)  11/22/11 100 lb 1.3 oz (45.396 kg)   Temp Readings from Last 3 Encounters:  05/16/12 97.7 F (36.5 C) Oral  11/27/11 98 F (36.7 C) Oral  06/06/11 98.2 F (36.8 C) Oral   BP Readings from Last 3 Encounters:  05/16/12 154/60  11/27/11 104/63  11/22/11 130/68   Pulse Readings from Last 3 Encounters:  05/16/12 110  11/27/11 84  11/22/11 92    BP 154/60  Pulse 110  Temp(Src) 97.7 F (36.5 C) (Oral)  Resp 24  Ht 5\' 1"  (1.549 m)  Wt 100 lb  (45.36 kg)  BMI 18.9 kg/m2  SpO2 100%  Filed Vitals:   05/16/12 1039 05/16/12 1100 05/16/12 1411  BP: 167/70 160/57 154/60  Pulse: 109  110  Temp: 97.7 F (36.5 C)    TempSrc: Oral    Resp: 24 25 24   Height: 5\' 1"  (1.549 m)    Weight: 100 lb (45.36 kg)    SpO2: 93%  100%    Medications  0.9 %  sodium chloride infusion ( Intravenous Stopped 05/16/12 1732)  sodium chloride 0.9 % bolus 500 mL (0 mLs Intravenous Stopped 05/16/12 1332)    Labs Reviewed  BASIC METABOLIC PANEL - Abnormal; Notable for the following:    CO2 39 (*)    Glucose, Bld 107 (*)    GFR calc non Af Amer 87 (*)    All other components within normal limits  URINALYSIS, ROUTINE W REFLEX MICROSCOPIC - Abnormal; Notable for the following:    Specific Gravity, Urine >1.030 (*)    Hgb urine dipstick TRACE (*)    Bilirubin Urine SMALL (*)    All other components within normal limits  URINE MICROSCOPIC-ADD ON - Abnormal; Notable for the following:    Bacteria, UA FEW (*)    All other components within normal limits  URINE CULTURE  CBC WITH DIFFERENTIAL  TROPONIN I    Dg Chest Portable 1 View  05/16/2012  *RADIOLOGY REPORT*  Clinical Data: Shortness of breath.  Weakness.  COPD with chronic bronchitis.  PORTABLE CHEST - 1 VIEW  Comparison: 11/25/2037.  Findings: Cardiac silhouette is normal size and  shape. Ectasia and nonaneurysmal calcification of the thoracic aorta are seen.  Hilar and mediastinal contours appear stable.  Previous median sternotomy has been performed.  The diaphragm is low-lying with flattening consistent with generalized hyperinflation and obstructive pulmonary disease.  No pulmonary infiltrates or masses are seen.  There is some central peribronchial thickening.  There is slightly osteopenic appearance of bones.  IMPRESSION: Hyperinflation configuration consistent with COPD.  Central peribronchial thickening.   Original Report Authenticated By: Onalee Hua Call    Nursing Notes Reviewed/ Care  Coordinated Applicable Imaging Reviewed Interpretation of Laboratory Data incorporated into ED treatment  1. Dehydration       MDM  Subacute sx with mild dehydration. No resp. Distress, or evidensis for sepsis. Doubt metabolic instability, serious bacterial infection or impending vascular collapse; the patient is stable for discharge.   Plan: Home Medications- usual; Home Treatments-push oral fluids; Recommended follow up- PCP in 1 week   I personally performed the services described in this documentation, which was scribed in my presence. The recorded information has been reviewed and is accurate.    Flint Melter, MD 05/16/12 2023

## 2012-05-16 NOTE — ED Notes (Signed)
In and out catheter completed using sterile technique. Very little urine obtained. Sent to lab for analysis.

## 2012-05-16 NOTE — ED Notes (Signed)
Pt reports being sick for last 2 months. Increasingly sob, and weak, has been unable to eat. Stopped smoking 2 months ago. Denies any fever, normally is on 2l oxygen at home and has been self adjusting at home. +nausea at times. No vomiting, or diarrhea.

## 2012-05-18 LAB — URINE CULTURE

## 2012-05-22 ENCOUNTER — Ambulatory Visit: Payer: Medicare Other | Admitting: Family Medicine

## 2012-05-28 ENCOUNTER — Other Ambulatory Visit: Payer: Self-pay

## 2012-05-28 MED ORDER — PROPRANOLOL HCL 20 MG PO TABS: 20.0000 mg | ORAL_TABLET | Freq: Two times a day (BID) | ORAL | Status: DC

## 2012-05-28 MED ORDER — METHIMAZOLE 5 MG PO TABS: 5.0000 mg | ORAL_TABLET | ORAL | Status: DC

## 2012-07-28 ENCOUNTER — Inpatient Hospital Stay (HOSPITAL_COMMUNITY)
Admission: EM | Admit: 2012-07-28 | Discharge: 2012-07-31 | DRG: 884 | Disposition: A | Discharge status: Skilled Nursing Facility | Payer: Medicare Other | Attending: Internal Medicine | Admitting: Internal Medicine

## 2012-07-28 ENCOUNTER — Emergency Department (HOSPITAL_COMMUNITY): Payer: Medicare Other

## 2012-07-28 ENCOUNTER — Encounter (HOSPITAL_COMMUNITY): Payer: Self-pay | Admitting: *Deleted

## 2012-07-28 DIAGNOSIS — Z8249 Family history of ischemic heart disease and other diseases of the circulatory system: Secondary | ICD-10-CM | POA: Diagnosis not present

## 2012-07-28 DIAGNOSIS — R4789 Other speech disturbances: Secondary | ICD-10-CM | POA: Diagnosis not present

## 2012-07-28 DIAGNOSIS — N289 Disorder of kidney and ureter, unspecified: Secondary | ICD-10-CM | POA: Diagnosis not present

## 2012-07-28 DIAGNOSIS — J449 Chronic obstructive pulmonary disease, unspecified: Secondary | ICD-10-CM

## 2012-07-28 DIAGNOSIS — I10 Essential (primary) hypertension: Secondary | ICD-10-CM | POA: Diagnosis present

## 2012-07-28 DIAGNOSIS — R293 Abnormal posture: Secondary | ICD-10-CM | POA: Diagnosis not present

## 2012-07-28 DIAGNOSIS — F411 Generalized anxiety disorder: Secondary | ICD-10-CM | POA: Diagnosis present

## 2012-07-28 DIAGNOSIS — I6789 Other cerebrovascular disease: Secondary | ICD-10-CM | POA: Diagnosis not present

## 2012-07-28 DIAGNOSIS — I00 Rheumatic fever without heart involvement: Secondary | ICD-10-CM | POA: Diagnosis not present

## 2012-07-28 DIAGNOSIS — Z881 Allergy status to other antibiotic agents status: Secondary | ICD-10-CM

## 2012-07-28 DIAGNOSIS — M810 Age-related osteoporosis without current pathological fracture: Secondary | ICD-10-CM | POA: Diagnosis present

## 2012-07-28 DIAGNOSIS — R404 Transient alteration of awareness: Secondary | ICD-10-CM | POA: Diagnosis not present

## 2012-07-28 DIAGNOSIS — J9610 Chronic respiratory failure, unspecified whether with hypoxia or hypercapnia: Secondary | ICD-10-CM | POA: Diagnosis present

## 2012-07-28 DIAGNOSIS — R4182 Altered mental status, unspecified: Secondary | ICD-10-CM | POA: Diagnosis not present

## 2012-07-28 DIAGNOSIS — F0390 Unspecified dementia without behavioral disturbance: Secondary | ICD-10-CM | POA: Diagnosis not present

## 2012-07-28 DIAGNOSIS — Z23 Encounter for immunization: Secondary | ICD-10-CM

## 2012-07-28 DIAGNOSIS — IMO0002 Reserved for concepts with insufficient information to code with codable children: Secondary | ICD-10-CM | POA: Diagnosis not present

## 2012-07-28 DIAGNOSIS — Z7982 Long term (current) use of aspirin: Secondary | ICD-10-CM | POA: Diagnosis not present

## 2012-07-28 DIAGNOSIS — Z880 Allergy status to penicillin: Secondary | ICD-10-CM | POA: Diagnosis not present

## 2012-07-28 DIAGNOSIS — E0590 Thyrotoxicosis, unspecified without thyrotoxic crisis or storm: Secondary | ICD-10-CM | POA: Diagnosis not present

## 2012-07-28 DIAGNOSIS — Z9981 Dependence on supplemental oxygen: Secondary | ICD-10-CM

## 2012-07-28 DIAGNOSIS — R279 Unspecified lack of coordination: Secondary | ICD-10-CM | POA: Diagnosis not present

## 2012-07-28 DIAGNOSIS — Z66 Do not resuscitate: Secondary | ICD-10-CM | POA: Diagnosis present

## 2012-07-28 DIAGNOSIS — E860 Dehydration: Secondary | ICD-10-CM | POA: Diagnosis present

## 2012-07-28 DIAGNOSIS — I359 Nonrheumatic aortic valve disorder, unspecified: Secondary | ICD-10-CM | POA: Diagnosis present

## 2012-07-28 DIAGNOSIS — Z954 Presence of other heart-valve replacement: Secondary | ICD-10-CM | POA: Diagnosis not present

## 2012-07-28 DIAGNOSIS — Z882 Allergy status to sulfonamides status: Secondary | ICD-10-CM | POA: Diagnosis not present

## 2012-07-28 DIAGNOSIS — J4489 Other specified chronic obstructive pulmonary disease: Secondary | ICD-10-CM | POA: Diagnosis present

## 2012-07-28 DIAGNOSIS — E785 Hyperlipidemia, unspecified: Secondary | ICD-10-CM | POA: Diagnosis present

## 2012-07-28 DIAGNOSIS — Z79899 Other long term (current) drug therapy: Secondary | ICD-10-CM

## 2012-07-28 DIAGNOSIS — Z823 Family history of stroke: Secondary | ICD-10-CM | POA: Diagnosis not present

## 2012-07-28 DIAGNOSIS — R9431 Abnormal electrocardiogram [ECG] [EKG]: Secondary | ICD-10-CM | POA: Diagnosis not present

## 2012-07-28 DIAGNOSIS — E162 Hypoglycemia, unspecified: Secondary | ICD-10-CM | POA: Diagnosis present

## 2012-07-28 DIAGNOSIS — R6889 Other general symptoms and signs: Secondary | ICD-10-CM | POA: Diagnosis not present

## 2012-07-28 DIAGNOSIS — R488 Other symbolic dysfunctions: Secondary | ICD-10-CM | POA: Diagnosis not present

## 2012-07-28 DIAGNOSIS — M6281 Muscle weakness (generalized): Secondary | ICD-10-CM | POA: Diagnosis not present

## 2012-07-28 DIAGNOSIS — Z888 Allergy status to other drugs, medicaments and biological substances status: Secondary | ICD-10-CM | POA: Diagnosis not present

## 2012-07-28 LAB — BLOOD GAS, ARTERIAL
Bicarbonate: 28.1 mEq/L — ABNORMAL HIGH (ref 20.0–24.0)
Patient temperature: 37
TCO2: 25.6 mmol/L (ref 0–100)
pCO2 arterial: 51.9 mmHg — ABNORMAL HIGH (ref 35.0–45.0)
pH, Arterial: 7.353 (ref 7.350–7.450)

## 2012-07-28 LAB — COMPREHENSIVE METABOLIC PANEL
AST: 17 U/L (ref 0–37)
Albumin: 3.9 g/dL (ref 3.5–5.2)
Alkaline Phosphatase: 124 U/L — ABNORMAL HIGH (ref 39–117)
Chloride: 96 mEq/L (ref 96–112)
Potassium: 3.7 mEq/L (ref 3.5–5.1)
Sodium: 137 mEq/L (ref 135–145)
Total Bilirubin: 0.5 mg/dL (ref 0.3–1.2)
Total Protein: 7.7 g/dL (ref 6.0–8.3)

## 2012-07-28 LAB — CBC WITH DIFFERENTIAL/PLATELET
Basophils Absolute: 0.1 10*3/uL (ref 0.0–0.1)
Basophils Relative: 1 % (ref 0–1)
Eosinophils Absolute: 0.4 10*3/uL (ref 0.0–0.7)
Hemoglobin: 13.7 g/dL (ref 12.0–15.0)
MCH: 29 pg (ref 26.0–34.0)
MCHC: 33.3 g/dL (ref 30.0–36.0)
Neutro Abs: 6.1 10*3/uL (ref 1.7–7.7)
Neutrophils Relative %: 65 % (ref 43–77)
Platelets: 333 10*3/uL (ref 150–400)
RDW: 13 % (ref 11.5–15.5)

## 2012-07-28 LAB — AMMONIA: Ammonia: 13 umol/L (ref 11–60)

## 2012-07-28 LAB — URINALYSIS, ROUTINE W REFLEX MICROSCOPIC
Bilirubin Urine: NEGATIVE
Glucose, UA: NEGATIVE mg/dL
Ketones, ur: NEGATIVE mg/dL
Leukocytes, UA: NEGATIVE
Nitrite: NEGATIVE
Specific Gravity, Urine: <1.005 — ABNORMAL LOW (ref 1.005–1.030)
pH: 6 (ref 5.0–8.0)

## 2012-07-28 LAB — LIPASE, BLOOD: Lipase: 27 U/L (ref 11–59)

## 2012-07-28 MED ORDER — ONDANSETRON HCL 4 MG/2ML IJ SOLN
4.0000 mg | Freq: Four times a day (QID) | INTRAMUSCULAR | Status: DC | PRN
  Administered 2012-07-29: 4 mg via INTRAVENOUS
  Filled 2012-07-28: qty 2

## 2012-07-28 MED ORDER — PROPRANOLOL HCL 20 MG PO TABS
20.0000 mg | ORAL_TABLET | Freq: Two times a day (BID) | ORAL | Status: DC
  Administered 2012-07-28 – 2012-07-30 (×4): 20 mg via ORAL
  Filled 2012-07-28 (×4): qty 1

## 2012-07-28 MED ORDER — IPRATROPIUM-ALBUTEROL 20-100 MCG/ACT IN AERS
1.0000 | INHALATION_SPRAY | Freq: Four times a day (QID) | RESPIRATORY_TRACT | Status: DC
  Filled 2012-07-28: qty 4

## 2012-07-28 MED ORDER — BIOTENE DRY MOUTH MT LIQD
15.0000 mL | Freq: Two times a day (BID) | OROMUCOSAL | Status: DC
  Administered 2012-07-28 – 2012-07-31 (×6): 15 mL via OROMUCOSAL

## 2012-07-28 MED ORDER — HALOPERIDOL LACTATE 5 MG/ML IJ SOLN
2.0000 mg | Freq: Four times a day (QID) | INTRAMUSCULAR | Status: DC | PRN
  Administered 2012-07-31: 2 mg via INTRAVENOUS
  Filled 2012-07-28: qty 1

## 2012-07-28 MED ORDER — IPRATROPIUM BROMIDE 0.02 % IN SOLN
RESPIRATORY_TRACT | Status: AC
  Administered 2012-07-28: 0.5 mg
  Filled 2012-07-28: qty 2.5

## 2012-07-28 MED ORDER — ONDANSETRON HCL 4 MG PO TABS: 4.0000 mg | ORAL_TABLET | Freq: Four times a day (QID) | ORAL | Status: DC | PRN

## 2012-07-28 MED ORDER — ACETAMINOPHEN 325 MG PO TABS: 650.0000 mg | ORAL_TABLET | Freq: Four times a day (QID) | ORAL | Status: DC | PRN

## 2012-07-28 MED ORDER — HYDRALAZINE HCL 25 MG PO TABS: 25.0000 mg | ORAL_TABLET | Freq: Four times a day (QID) | ORAL | Status: DC | PRN

## 2012-07-28 MED ORDER — ACETAMINOPHEN 650 MG RE SUPP: 650.0000 mg | Freq: Four times a day (QID) | RECTAL | Status: DC | PRN

## 2012-07-28 MED ORDER — LOSARTAN POTASSIUM 25 MG PO TABS
12.5000 mg | ORAL_TABLET | Freq: Every day | ORAL | Status: DC
  Administered 2012-07-29 – 2012-07-31 (×3): 12.5 mg via ORAL
  Filled 2012-07-28 (×6): qty 0.5

## 2012-07-28 MED ORDER — SODIUM CHLORIDE 0.9 % IV SOLN
INTRAVENOUS | Status: AC
  Administered 2012-07-28: 16:00:00 via INTRAVENOUS

## 2012-07-28 MED ORDER — IPRATROPIUM-ALBUTEROL 20-100 MCG/ACT IN AERS
INHALATION_SPRAY | RESPIRATORY_TRACT | Status: AC
  Filled 2012-07-28: qty 4

## 2012-07-28 MED ORDER — SODIUM CHLORIDE 0.9 % IV SOLN: INTRAVENOUS | Status: DC

## 2012-07-28 MED ORDER — ASPIRIN EC 81 MG PO TBEC
81.0000 mg | DELAYED_RELEASE_TABLET | Freq: Every day | ORAL | Status: DC
  Administered 2012-07-28 – 2012-07-31 (×4): 81 mg via ORAL
  Filled 2012-07-28 (×4): qty 1

## 2012-07-28 MED ORDER — FLUTICASONE PROPIONATE 50 MCG/ACT NA SUSP
2.0000 | Freq: Every day | NASAL | Status: DC | PRN
  Filled 2012-07-28: qty 16

## 2012-07-28 MED ORDER — SODIUM CHLORIDE 0.9 % IV BOLUS (SEPSIS)
250.0000 mL | Freq: Once | INTRAVENOUS | Status: AC
  Administered 2012-07-28: 1000 mL via INTRAVENOUS

## 2012-07-28 MED ORDER — INFLUENZA VAC TYPES A & B PF IM SUSP: 0.5000 mL | Freq: Once | INTRAMUSCULAR | Status: DC

## 2012-07-28 MED ORDER — MOMETASONE FURO-FORMOTEROL FUM 100-5 MCG/ACT IN AERO
2.0000 | INHALATION_SPRAY | Freq: Two times a day (BID) | RESPIRATORY_TRACT | Status: DC
  Administered 2012-07-29 – 2012-07-31 (×5): 2 via RESPIRATORY_TRACT
  Filled 2012-07-28: qty 8.8

## 2012-07-28 MED ORDER — ASPIRIN 81 MG PO TBEC: 81.0000 mg | DELAYED_RELEASE_TABLET | ORAL | Status: DC

## 2012-07-28 MED ORDER — ENOXAPARIN SODIUM 40 MG/0.4ML ~~LOC~~ SOLN
40.0000 mg | SUBCUTANEOUS | Status: DC
  Administered 2012-07-28: 40 mg via SUBCUTANEOUS
  Filled 2012-07-28: qty 0.4

## 2012-07-28 MED ORDER — PANTOPRAZOLE SODIUM 40 MG PO TBEC
40.0000 mg | DELAYED_RELEASE_TABLET | Freq: Every day | ORAL | Status: DC
  Administered 2012-07-28 – 2012-07-31 (×4): 40 mg via ORAL
  Filled 2012-07-28 (×4): qty 1

## 2012-07-28 MED ORDER — TIOTROPIUM BROMIDE MONOHYDRATE 18 MCG IN CAPS
18.0000 ug | ORAL_CAPSULE | Freq: Every day | RESPIRATORY_TRACT | Status: DC
  Filled 2012-07-28: qty 5

## 2012-07-28 MED ORDER — TIOTROPIUM BROMIDE MONOHYDRATE 18 MCG IN CAPS
ORAL_CAPSULE | RESPIRATORY_TRACT | Status: AC
  Filled 2012-07-28: qty 5

## 2012-07-28 MED ORDER — ALBUTEROL SULFATE (5 MG/ML) 0.5% IN NEBU
INHALATION_SOLUTION | RESPIRATORY_TRACT | Status: AC
  Administered 2012-07-28: 2.5 mg
  Filled 2012-07-28: qty 0.5

## 2012-07-28 MED ORDER — INFLUENZA VIRUS VACC SPLIT PF IM SUSP
0.5000 mL | Freq: Once | INTRAMUSCULAR | Status: DC
  Filled 2012-07-28: qty 0.5

## 2012-07-28 MED ORDER — POTASSIUM CHLORIDE IN NACL 20-0.9 MEQ/L-% IV SOLN
INTRAVENOUS | Status: DC
  Administered 2012-07-28 – 2012-07-29 (×2): via INTRAVENOUS

## 2012-07-28 MED ORDER — MOMETASONE FURO-FORMOTEROL FUM 100-5 MCG/ACT IN AERO
INHALATION_SPRAY | RESPIRATORY_TRACT | Status: AC
  Filled 2012-07-28: qty 8.8

## 2012-07-28 MED ORDER — METHIMAZOLE 5 MG PO TABS
5.0000 mg | ORAL_TABLET | ORAL | Status: DC
  Administered 2012-07-29 – 2012-07-31 (×3): 5 mg via ORAL
  Filled 2012-07-28 (×5): qty 1

## 2012-07-28 NOTE — ED Notes (Signed)
Unable to obtain vital signs.  Patient very restless and would not allow b/p cuff nor pulse ox.

## 2012-07-28 NOTE — ED Notes (Signed)
MD at bedside. 

## 2012-07-28 NOTE — ED Notes (Signed)
Family member states that pt has not been sleeping (only a hour and a half this morning), not taking her medications as she should, not eating or drinking as well, and has had increased confusion. States pt has been grasping for things not there, putting the telephone in the trash can, etc.

## 2012-07-28 NOTE — Progress Notes (Signed)
Pt arrived on unit to room 215 from ED.  Dr. Kerry Hough notified via text page of patient's arrival to the floor.  Family at bedside.

## 2012-07-28 NOTE — ED Notes (Signed)
Pt stated she had to go to the bathroom.  Pt was able to urinate using bed pan.

## 2012-07-28 NOTE — ED Provider Notes (Signed)
History  This chart was scribed for Shelda Jakes, MD by Greggory Stallion, ED Scribe. This patient was seen in room [facility]18[facility]18 and the patient's care was started at 7:39 AM.   CSN: [id number]  Arrival date & time 07/28/12  0734    Chief Complaint  Patient presents with  . Altered Mental Status    Patient is a 72 y.o. female presenting with altered mental status. The history is provided by the EMS personnel and a caregiver. The history is limited by the condition of the patient. No language interpreter was used.  Altered Mental Status Presenting symptoms: behavior changes and confusion   Severity:  Moderate Most recent episode:  2 days ago Episode history:  Multiple Timing:  Intermittent Chronicity:  Recurrent Associated symptoms: no fever    Level 5 Caveat: Altered Mental Status  HPI Comments: [name] is a 72 y.o. Female with h/o HTN and COPD brought to ED by EMS who presents to the Emergency Department with intermittent episodes AMS that started Friday night. Caregiver states pt has been confused for two days and it has not stopped. Caregiver states it normally gets better with rest but it hasn't this time. Caregiver states that pt has been complaining of right arm pain for two weeks. EMS states pt was initially agitated but went to sleep en route and remains drowsy. Caregiver told EMS she was more agitated than usual during the night. Caregiver states she is supposed to be on 2L of oxygen but isn't always on it. Caregiver states she does not think the pt has taken all of her medications. She states pt is not eating or drinking very well. Caregiver states she has had a clear nasal discharge for the last few days.   PCP is Dr. Syliva Overman  Past Medical History  Diagnosis Date  . Rheumatic fever   . Renal insufficiency   . Aortic stenosis   . Hyperlipidemia   . Hypertension   . COPD with chronic bronchitis   . Osteoporosis   . Sebaceous cyst   . Urosepsis 2012     required several hospitalizations for this  . Thyroid disease 2012    hyperthyroid, had abl;ation therapy treated by endo  . Anxiety     Past Surgical History  Procedure Laterality Date  . 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 Appendectomy    . Cholecystectomy    . Lumbar laminectomy    . Tonsillectomy    . Colon surgery  2005  . Breast lumpectomy    . Esophagogastroduodenoscopy  10/12/09    short segment Barrett's esophage/small hiatal hernia,antral ersions  . Colonoscopy  07/23/2008     Single diminutive polyp in the rectum status post cold biopsy removal/ Status post right hemicolectomy/ Colonic ulcers in the upstream end the colon distal to the  anastomosis as described above.  Status post biopsy. The anastomosis itself and neoterminal ileum appeared normal    Family History  Problem Relation Age of Onset  . Heart attack Mother 35  . Stroke Father 87  . Hypertension Sister     2 sisters    History  Substance Use Topics  . Smoking status: Smoker, Current Status Unknown -- 0.50 packs/day for 50 years    Types: Cigarettes  . Smokeless tobacco: Current User     Comment: sometimes every other day   . Alcohol Use: No    OB History   Grav Para Term Preterm Abortions  TAB SAB Ect Mult Living                  Review of Systems  Unable to perform ROS: Mental status change  Constitutional: Negative for fever.  HENT: Positive for rhinorrhea.   Cardiovascular: Negative for leg swelling.  Gastrointestinal: Negative for abdominal distention.  Psychiatric/Behavioral: Positive for confusion and altered mental status.    Allergies  Nitrofurantoin; Bupropion hcl; Cyclobenzaprine hcl; Gadolinium; Levofloxacin; Lorazepam; Penicillins; Statins; and Sulfonamide derivatives  Home Medications   Current Outpatient Rx  Name  Route  Sig  Dispense  Refill  . albuterol (PROVENTIL HFA) 108 (90 BASE) MCG/ACT inhaler   Inhalation   Inhale 2 puffs into the lungs every 6  (six) hours as needed. Shortness of breath         . aspirin (ADULT ASPIRIN EC LOW STRENGTH) 81 MG EC tablet   Oral   Take 81 mg by mouth every morning.          . Calcium Carbonate-Vit D-Min (QC CALCIUM/MINERALS/VITAMIN D) 600-400 MG-UNIT TABS   Oral   Take 1 tablet by mouth every evening.          . Choline Fenofibrate (TRILIPIX) 135 MG capsule   Oral   Take 135 mg by mouth at bedtime.         Marland Kitchen EPINEPHrine (EPI-PEN) 0.3 mg/0.3 mL DEVI   Intramuscular   Inject 0.3 mLs (0.3 mg total) into the muscle once.   1 Device   2   . fluticasone (FLONASE) 50 MCG/ACT nasal spray   Nasal   Place 2 sprays into the nose daily as needed. Sinus/congestion         . Fluticasone-Salmeterol (ADVAIR) 250-50 MCG/DOSE AEPB   Inhalation   Inhale 1 puff into the lungs 2 (two) times daily.         . Ipratropium-Albuterol (COMBIVENT) 20-100 MCG/ACT AERS respimat   Inhalation   Inhale 1 puff into the lungs every 6 (six) hours.   1 Inhaler   5   . lansoprazole (PREVACID) 15 MG capsule   Oral   Take 15 mg by mouth daily.         Marland Kitchen loratadine (CLARITIN) 10 MG tablet   Oral   Take 10 mg by mouth every morning.          . methimazole (TAPAZOLE) 5 MG tablet   Oral   Take 1 tablet (5 mg total) by mouth every morning.   90 tablet   1   . Omega-3 Fatty Acids (FISH OIL CONCENTRATE) 1000 MG CAPS   Oral   Take 1 capsule by mouth 2 (two) times daily.          . pantoprazole (PROTONIX) 40 MG tablet   Oral   Take 1 tablet (40 mg total) by mouth daily.   90 tablet   0   . propranolol (INDERAL) 20 MG tablet   Oral   Take 1 tablet (20 mg total) by mouth 2 (two) times daily.   180 tablet   1   . tiotropium (SPIRIVA) 18 MCG inhalation capsule   Inhalation   Place 18 mcg into inhaler and inhale daily.         . vitamin C (ASCORBIC ACID) 500 MG tablet   Oral   Take 500 mg by mouth daily.           BP 157/74  Pulse 99  Temp(Src) 98 F (36.7 C) (Axillary)  Resp 22  Ht 5\' 2"  (1.575 m)  Wt 100 lb (45.36 kg)  BMI 18.29 kg/m2  SpO2 99%  Physical Exam  Nursing note and vitals reviewed. Cardiovascular: Normal rate and regular rhythm.   No murmur heard. Pulmonary/Chest:  Bilateral rare wheeze  Abdominal: Bowel sounds are normal. She exhibits no distension. There is no tenderness.  Musculoskeletal:  No leg swelling.  Neurological:  Confused.  Skin: Skin is warm and dry.    ED Course  Procedures (including critical care time)  DIAGNOSTIC STUDIES: Oxygen Saturation is 99% on [place], normal by my interpretation.    COORDINATION OF CARE: 8:01 AM-Discussed treatment plan with pt at bedside and pt agreed to plan.   Results for orders placed during the hospital encounter of 07/28/12  CBC WITH DIFFERENTIAL      Result Value Range   WBC 9.3  4.0 - 10.5 K/uL   RBC 4.73  3.87 - 5.11 MIL/uL   Hemoglobin 13.7  12.0 - 15.0 g/dL   HCT 13.0  86.5 - 78.4 %   MCV 87.1  78.0 - 100.0 fL   MCH 29.0  26.0 - 34.0 pg   MCHC 33.3  30.0 - 36.0 g/dL   RDW 69.6  29.5 - 28.4 %   Platelets 333  150 - 400 K/uL   Neutrophils Relative % 65  43 - 77 %   Neutro Abs 6.1  1.7 - 7.7 K/uL   Lymphocytes Relative 22  12 - 46 %   Lymphs Abs 2.0  0.7 - 4.0 K/uL   Monocytes Relative 8  3 - 12 %   Monocytes Absolute 0.8  0.1 - 1.0 K/uL   Eosinophils Relative 4  0 - 5 %   Eosinophils Absolute 0.4  0.0 - 0.7 K/uL   Basophils Relative 1  0 - 1 %   Basophils Absolute 0.1  0.0 - 0.1 K/uL  COMPREHENSIVE METABOLIC PANEL      Result Value Range   Sodium 137  135 - 145 mEq/L   Potassium 3.7  3.5 - 5.1 mEq/L   Chloride 96  96 - 112 mEq/L   CO2 33 (*) 19 - 32 mEq/L   Glucose, Bld 106 (*) 70 - 99 mg/dL   BUN 10  6 - 23 mg/dL   Creatinine, Ser 1.32  0.50 - 1.10 mg/dL   Calcium 44.0  8.4 - 10.2 mg/dL   Total Protein 7.7  6.0 - 8.3 g/dL   Albumin 3.9  3.5 - 5.2 g/dL   AST 17  0 - 37 U/L   ALT 7  0 - 35 U/L   Alkaline Phosphatase 124 (*) 39 - 117 U/L   Total Bilirubin 0.5  0.3 - 1.2  mg/dL   GFR calc non Af Amer 51 (*) >90 mL/min   GFR calc Af Amer 59 (*) >90 mL/min  LIPASE, BLOOD      Result Value Range   Lipase 27  11 - 59 U/L  URINALYSIS, ROUTINE W REFLEX MICROSCOPIC      Result Value Range   Color, Urine YELLOW  YELLOW   APPearance CLEAR  CLEAR   Specific Gravity, Urine <1.005 (*) 1.005 - 1.030   pH 6.0  5.0 - 8.0   Glucose, UA NEGATIVE  NEGATIVE mg/dL   Hgb urine dipstick NEGATIVE  NEGATIVE   Bilirubin Urine NEGATIVE  NEGATIVE   Ketones, ur NEGATIVE  NEGATIVE mg/dL   Protein, ur NEGATIVE  NEGATIVE mg/dL   Urobilinogen, UA 0.2  0.0 -  1.0 mg/dL   Nitrite NEGATIVE  NEGATIVE   Leukocytes, UA NEGATIVE  NEGATIVE  BLOOD GAS, ARTERIAL      Result Value Range   O2 Content 2.0     Delivery systems NASAL CANNULA     pH, Arterial 7.353  7.350 - 7.450   pCO2 arterial 51.9 (*) 35.0 - 45.0 mmHg   pO2, Arterial 105.0 (*) 80.0 - 100.0 mmHg   Bicarbonate 28.1 (*) 20.0 - 24.0 mEq/L   TCO2 25.6  0 - 100 mmol/L   Acid-Base Excess 3.0 (*) 0.0 - 2.0 mmol/L   O2 Saturation 97.5     Patient temperature 37.0     Collection site BRACHIAL ARTERY     Drawn by COLLECTED BY RT     Sample type ARTERIAL     Allens test (pass/fail) NOT INDICATED (*) PASS   No results found.  Medications  0.9 %  sodium chloride infusion ( Intravenous Rate/Dose Change 07/28/12 0841)  sodium chloride 0.9 % bolus 250 mL (0 mLs Intravenous Stopped 07/28/12 0900)   Results for orders placed during the hospital encounter of 07/28/12  CBC WITH DIFFERENTIAL      Result Value Range   WBC 9.3  4.0 - 10.5 K/uL   RBC 4.73  3.87 - 5.11 MIL/uL   Hemoglobin 13.7  12.0 - 15.0 g/dL   HCT 09.8  11.9 - 14.7 %   MCV 87.1  78.0 - 100.0 fL   MCH 29.0  26.0 - 34.0 pg   MCHC 33.3  30.0 - 36.0 g/dL   RDW 82.9  56.2 - 13.0 %   Platelets 333  150 - 400 K/uL   Neutrophils Relative % 65  43 - 77 %   Neutro Abs 6.1  1.7 - 7.7 K/uL   Lymphocytes Relative 22  12 - 46 %   Lymphs Abs 2.0  0.7 - 4.0 K/uL   Monocytes  Relative 8  3 - 12 %   Monocytes Absolute 0.8  0.1 - 1.0 K/uL   Eosinophils Relative 4  0 - 5 %   Eosinophils Absolute 0.4  0.0 - 0.7 K/uL   Basophils Relative 1  0 - 1 %   Basophils Absolute 0.1  0.0 - 0.1 K/uL  COMPREHENSIVE METABOLIC PANEL      Result Value Range   Sodium 137  135 - 145 mEq/L   Potassium 3.7  3.5 - 5.1 mEq/L   Chloride 96  96 - 112 mEq/L   CO2 33 (*) 19 - 32 mEq/L   Glucose, Bld 106 (*) 70 - 99 mg/dL   BUN 10  6 - 23 mg/dL   Creatinine, Ser 8.65  0.50 - 1.10 mg/dL   Calcium 78.4  8.4 - 69.6 mg/dL   Total Protein 7.7  6.0 - 8.3 g/dL   Albumin 3.9  3.5 - 5.2 g/dL   AST 17  0 - 37 U/L   ALT 7  0 - 35 U/L   Alkaline Phosphatase 124 (*) 39 - 117 U/L   Total Bilirubin 0.5  0.3 - 1.2 mg/dL   GFR calc non Af Amer 51 (*) >90 mL/min   GFR calc Af Amer 59 (*) >90 mL/min  LIPASE, BLOOD      Result Value Range   Lipase 27  11 - 59 U/L  URINALYSIS, ROUTINE W REFLEX MICROSCOPIC      Result Value Range   Color, Urine YELLOW  YELLOW   APPearance CLEAR  CLEAR  Specific Gravity, Urine <1.005 (*) 1.005 - 1.030   pH 6.0  5.0 - 8.0   Glucose, UA NEGATIVE  NEGATIVE mg/dL   Hgb urine dipstick NEGATIVE  NEGATIVE   Bilirubin Urine NEGATIVE  NEGATIVE   Ketones, ur NEGATIVE  NEGATIVE mg/dL   Protein, ur NEGATIVE  NEGATIVE mg/dL   Urobilinogen, UA 0.2  0.0 - 1.0 mg/dL   Nitrite NEGATIVE  NEGATIVE   Leukocytes, UA NEGATIVE  NEGATIVE  BLOOD GAS, ARTERIAL      Result Value Range   O2 Content 2.0     Delivery systems NASAL CANNULA     pH, Arterial 7.353  7.350 - 7.450   pCO2 arterial 51.9 (*) 35.0 - 45.0 mmHg   pO2, Arterial 105.0 (*) 80.0 - 100.0 mmHg   Bicarbonate 28.1 (*) 20.0 - 24.0 mEq/L   TCO2 25.6  0 - 100 mmol/L   Acid-Base Excess 3.0 (*) 0.0 - 2.0 mmol/L   O2 Saturation 97.5     Patient temperature 37.0     Collection site BRACHIAL ARTERY     Drawn by COLLECTED BY RT     Sample type ARTERIAL     Allens test (pass/fail) NOT INDICATED (*) PASS   Dg Chest 1  View  07/28/2012   *RADIOLOGY REPORT*  Clinical Data: Altered level of consciousness.  CHEST - 1 VIEW  Comparison: Chest x-ray 05/16/2012.  Findings: Lung volumes are normal.  No consolidative airspace disease.  No pleural effusions.  No pneumothorax.  No pulmonary nodule or mass noted.  Pulmonary vasculature and the cardiomediastinal silhouette are within normal limits. Atherosclerosis in the thoracic aorta.  Status post median sternotomy.  IMPRESSION: 1. No radiographic evidence of acute cardiopulmonary disease. 2.  Atherosclerosis.   Original Report Authenticated By: Trudie Reed, M.D.   Ct Head Wo Contrast  07/28/2012   *RADIOLOGY REPORT*  Clinical Data: Altered mental status.  CT HEAD WITHOUT CONTRAST  Technique:  Contiguous axial images were obtained from the base of the skull through the vertex without contrast.  Comparison: MRI of the brain 08/16/2010.  Findings: There are patchy and confluent areas of decreased attenuation throughout the deep and periventricular white matter of the cerebral hemispheres bilaterally, compatible with chronic microvascular ischemic disease. No acute intracranial abnormalities.  Specifically, no evidence of acute intracranial hemorrhage, no definite findings of acute/subacute cerebral ischemia, no mass, mass effect, hydrocephalus or abnormal intra or extra-axial fluid collections.  Visualized paranasal sinuses and mastoids are well pneumatized.  No acute displaced skull fractures are identified.  IMPRESSION: 1.  No acute intracranial abnormalities. 2.  Chronic microvascular ischemic changes in the cerebral white matter, as above.   Original Report Authenticated By: Trudie Reed, M.D.     Date: 07/28/2012  Rate: 93  Rhythm: normal sinus rhythm and sinus arrhythmia  QRS Axis: normal  Intervals: normal  ST/T Wave abnormalities: nonspecific ST changes  Conduction Disutrbances:none  Narrative Interpretation:   Old EKG Reviewed: unchanged No significant changes  noted on EKG. Compared to 05/16/2012.    MDM  Patient with persistent altered mental status. Been going on for about 3 days. Patient does have a history of some dementia and has periods of intermixed the confusion with being loosened. This is last longer. Patient 20 or drinking well states that she is nauseated but food does not taste good. Her family is also having trouble with her confusion because she's throwing things away constantly taking her oxygen off. Workup without any specific findings patient is known to  have severe COPD her blood gas on oxygen without any sniffing abnormalities there. Electrolytes without any significant abnormalities. However clinically patient does appear dehydrated and would benefit from further hydration this may improve the mental status. CT head negative for any acute changes chest x-ray negative for pneumonia urinalysis negative for urinary tract infection no evidence of any infection anywhere.      I personally performed the services described in this documentation, which was scribed in my presence. The recorded information has been reviewed and is accurate.     Shelda Jakes, MD 07/28/12 [id number]

## 2012-07-28 NOTE — H&P (Signed)
[facility] History and Physical  [name] NFA:[id number] DOB: January 16, 1941 DOA: 07/28/2012  Referring physician: Dr. Vanetta Mulders, ER physician PCP: Syliva Overman, MD  Specialists: None  Chief Complaint: Confusion  HPI: [name] is a 72 y.o. female who was brought to the emergency room by her family for alteration in mental status and confusion. Patient is unable to provide any history. Her family reports that she has been increasingly confused, agitated for the past 2-3 days. She's not had a significant fever. No change in cough. No dysuria, vomiting or diarrhea. Her oral intake has been poor. She was having a runny nose. She has had episodes of confusion in the past, but these have been self-limited. Previously they have been attributed to urinary tract infection as well as dehydration. Workup in the emergency room was unremarkable. She does not have any evidence of ongoing infection. She does not appear to be taking any sedative medications.  CT of the head was unremarkable. The patient has been referred for admission for further workup.  Review of Systems: Difficult to assess due to patient's mental status. History is obtained from family members. Pertinent positives as per history of present illness, otherwise negative  Past Medical History  Diagnosis Date  . Rheumatic fever   . Renal insufficiency   . Aortic stenosis   . Hyperlipidemia   . Hypertension   . COPD with chronic bronchitis   . Osteoporosis   . Sebaceous cyst   . Urosepsis 2012    required several hospitalizations for this  . Thyroid disease 2012    hyperthyroid, had abl;ation therapy treated by endo  . Anxiety    Past Surgical History  Procedure Laterality Date  . 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 Appendectomy    . Cholecystectomy    . Lumbar laminectomy    . Tonsillectomy    . Colon surgery  2005  . Breast lumpectomy    . Esophagogastroduodenoscopy  10/12/09    short  segment Barrett's esophage/small hiatal hernia,antral ersions  . Colonoscopy  07/23/2008     Single diminutive polyp in the rectum status post cold biopsy removal/ Status post right hemicolectomy/ Colonic ulcers in the upstream end the colon distal to the  anastomosis as described above.  Status post biopsy. The anastomosis itself and neoterminal ileum appeared normal   Social History:  reports that she has been smoking Cigarettes.  She has a 25 pack-year smoking history. She uses smokeless tobacco. She reports that she does not drink alcohol or use illicit drugs.   Allergies  Allergen Reactions  . Nitrofurantoin Shortness Of Breath    Acute dyspnea  . Bupropion Hcl   . Cyclobenzaprine Hcl   . Gadolinium      Desc: PT REFUSES MR & CT CONTRAST DUE TO PRIOR REACTION, PT STATES HAIR FELL OUT & SKIN CHANGED COLOR   . Levofloxacin   . Lorazepam     Causes mental confusion/breakdown  . Penicillins Other (See Comments)    Child hood allergy  . Statins Other (See Comments)    Joint stiffness  . Sulfonamide Derivatives     Family History  Problem Relation Age of Onset  . Heart attack Mother 34  . Stroke Father 55  . Hypertension Sister     2 sisters    Prior to Admission medications   Medication Sig Start Date End Date Taking? Authorizing Provider  losartan (COZAAR) 25 MG tablet Take 12.5 mg  by mouth daily. If blood pressure levels are normal Patient does not take medication   Yes Historical Provider, MD  omeprazole (PRILOSEC) 20 MG capsule Take 20 mg by mouth daily.   Yes Historical Provider, MD  albuterol (PROVENTIL HFA) 108 (90 BASE) MCG/ACT inhaler Inhale 2 puffs into the lungs every 6 (six) hours as needed. Shortness of breath    Historical Provider, MD  aspirin (ADULT ASPIRIN EC LOW STRENGTH) 81 MG EC tablet Take 81 mg by mouth every morning.     Historical Provider, MD  Calcium Carbonate-Vit D-Min (QC CALCIUM/MINERALS/VITAMIN D) 600-400 MG-UNIT TABS Take 1 tablet by mouth every  evening.     Historical Provider, MD  Choline Fenofibrate (TRILIPIX) 135 MG capsule Take 135 mg by mouth at bedtime.    Historical Provider, MD  EPINEPHrine (EPI-PEN) 0.3 mg/0.3 mL DEVI Inject 0.3 mLs (0.3 mg total) into the muscle once. 11/25/11 11/24/12  Kerri Perches, MD  fluticasone (FLONASE) 50 MCG/ACT nasal spray Place 2 sprays into the nose daily as needed. Sinus/congestion    Historical Provider, MD  Fluticasone-Salmeterol (ADVAIR) 250-50 MCG/DOSE AEPB Inhale 1 puff into the lungs 2 (two) times daily.    Historical Provider, MD  Ipratropium-Albuterol (COMBIVENT) 20-100 MCG/ACT AERS respimat Inhale 1 puff into the lungs every 6 (six) hours. 02/28/12   Kerri Perches, MD  lansoprazole (PREVACID) 15 MG capsule Take 15 mg by mouth daily.    Historical Provider, MD  loratadine (CLARITIN) 10 MG tablet Take 10 mg by mouth every morning.     Historical Provider, MD  methimazole (TAPAZOLE) 5 MG tablet Take 1 tablet (5 mg total) by mouth every morning. 05/28/12   Kerri Perches, MD  Omega-3 Fatty Acids (FISH OIL CONCENTRATE) 1000 MG CAPS Take 1 capsule by mouth 2 (two) times daily.     Historical Provider, MD  pantoprazole (PROTONIX) 40 MG tablet Take 1 tablet (40 mg total) by mouth daily. 11/29/11   Kerri Perches, MD  propranolol (INDERAL) 20 MG tablet Take 1 tablet (20 mg total) by mouth 2 (two) times daily. 05/28/12   Kerri Perches, MD  tiotropium (SPIRIVA) 18 MCG inhalation capsule Place 18 mcg into inhaler and inhale daily.    Historical Provider, MD  vitamin C (ASCORBIC ACID) 500 MG tablet Take 500 mg by mouth daily.    Historical Provider, MD   Physical Exam: Filed Vitals:   07/28/12 1433 07/28/12 1520 07/28/12 1527 07/28/12 1547  BP: 172/78  123/73 156/76  Pulse: 91  82   Temp:    98 F (36.7 C)  TempSrc:    Oral  Resp: 20  20   Height:  5\' 2"  (1.575 m)    Weight:  41.368 kg (91 lb 3.2 oz)    SpO2: 99%  100%      General:  Somnolent, confused, no signs of acute  distress  Eyes: Pupils are equal react to light  ENT: Mucous membranes are dry  Neck: Supple  Cardiovascular: S1, S2, regular rate and rhythm  Respiratory: Clear to auscultation bilaterally  Abdomen: Soft, nontender, nondistended, bowel sounds are active  Skin: No rashes  Musculoskeletal: No pedal edema bilaterally  Psychiatric: Somnolent, confused  Neurologic: Grossly intact, moving all extremities spontaneously, no facial asymmetry  Labs on Admission:  Basic Metabolic Panel:  Recent Labs Lab 07/28/12 0831  NA 137  K 3.7  CL 96  CO2 33*  GLUCOSE 106*  BUN 10  CREATININE 1.08  CALCIUM 10.4  Liver Function Tests:  Recent Labs Lab 07/28/12 0831  AST 17  ALT 7  ALKPHOS 124*  BILITOT 0.5  PROT 7.7  ALBUMIN 3.9    Recent Labs Lab 07/28/12 0831  LIPASE 27   No results found for this basename: AMMONIA,  in the last 168 hours CBC:  Recent Labs Lab 07/28/12 0831  WBC 9.3  NEUTROABS 6.1  HGB 13.7  HCT 41.2  MCV 87.1  PLT 333   Cardiac Enzymes: No results found for this basename: CKTOTAL, CKMB, CKMBINDEX, TROPONINI,  in the last 168 hours  BNP (last 3 results) No results found for this basename: PROBNP,  in the last 8760 hours CBG: No results found for this basename: GLUCAP,  in the last 168 hours  Radiological Exams on Admission: Dg Chest 1 View  07/28/2012   *RADIOLOGY REPORT*  Clinical Data: Altered level of consciousness.  CHEST - 1 VIEW  Comparison: Chest x-ray 05/16/2012.  Findings: Lung volumes are normal.  No consolidative airspace disease.  No pleural effusions.  No pneumothorax.  No pulmonary nodule or mass noted.  Pulmonary vasculature and the cardiomediastinal silhouette are within normal limits. Atherosclerosis in the thoracic aorta.  Status post median sternotomy.  IMPRESSION: 1. No radiographic evidence of acute cardiopulmonary disease. 2.  Atherosclerosis.   Original Report Authenticated By: Trudie Reed, M.D.   Ct Head Wo  Contrast  07/28/2012   *RADIOLOGY REPORT*  Clinical Data: Altered mental status.  CT HEAD WITHOUT CONTRAST  Technique:  Contiguous axial images were obtained from the base of the skull through the vertex without contrast.  Comparison: MRI of the brain 08/16/2010.  Findings: There are patchy and confluent areas of decreased attenuation throughout the deep and periventricular white matter of the cerebral hemispheres bilaterally, compatible with chronic microvascular ischemic disease. No acute intracranial abnormalities.  Specifically, no evidence of acute intracranial hemorrhage, no definite findings of acute/subacute cerebral ischemia, no mass, mass effect, hydrocephalus or abnormal intra or extra-axial fluid collections.  Visualized paranasal sinuses and mastoids are well pneumatized.  No acute displaced skull fractures are identified.  IMPRESSION: 1.  No acute intracranial abnormalities. 2.  Chronic microvascular ischemic changes in the cerebral white matter, as above.   Original Report Authenticated By: Trudie Reed, M.D.    EKG: Independently reviewed. Normal sinus rhythm with no acute ST or T changes  Assessment/Plan Principal Problem:   Altered mental status Active Problems:   HYPERLIPIDEMIA   Essential hypertension, benign   CHRONIC OBSTRUCTIVE PULMONARY DISEASE   Hyperthyroidism   Dehydration   1. Altered mental status. Etiology is not entirely clear, but may be related to an element of dehydration. She does not have any evidence of infection. Chest x-ray/any pneumonia urinalysis does not show any signs of infection. She's not taking any sedative medications and CT head was also unremarkable. Blood gas did show mildly elevated PCO2, the pH is compensated and is likely chronic respiratory acidosis. We'll check TSH, ammonia, B12. She does not have any focal deficits at this time and therefore he does not require an MRI. If her mental status does not improve after hydration, but MRI may be  further considered. It is difficult to assess what her baseline mental status is. Her family reports that she is normally very lucid but does have periods of intermittent confusion. She may be developing some early stages of dementia. Although it does not appear that her current state is near her baseline. 2. Dehydration. Give IV fluids and reassess. 3. COPD. Continue  home oxygen and breathing treatments. 4. Hyperthyroidism. Check TSH and continue Tapazole and propranolol. 5. Hypertension. Continue outpatient regimen 6. Disposition. Family may be considering placement if the patient's mental status did not significantly improve. He wishes with the social worker regarding further insurance costs, assisted-living facility versus skilled nursing facility placement.    Code Status: DNR Family Communication: discussed with sister and sons at the bedside Disposition Plan: to be determined  Time spent:  MEMON,JEHANZEB [facility] Pager [phone]  If 7PM-7AM, please contact night-coverage [email or web address] Password [email or web address] 07/28/2012, 5:20 PM

## 2012-07-28 NOTE — ED Notes (Signed)
AMS after taking Benadryl this morning at 0400. Pt was initially agitated but went to sleep en route and remains drowsy. Caregiver told EMS she was more agitated than usual during the night.

## 2012-07-29 ENCOUNTER — Ambulatory Visit: Payer: Medicare Other | Admitting: Family Medicine

## 2012-07-29 LAB — BASIC METABOLIC PANEL
BUN: 8 mg/dL (ref 6–23)
Chloride: 109 mEq/L (ref 96–112)
Creatinine, Ser: 0.89 mg/dL (ref 0.50–1.10)
GFR calc Af Amer: 74 mL/min — ABNORMAL LOW (ref >90)
Glucose, Bld: 58 mg/dL — ABNORMAL LOW (ref 70–99)
Potassium: 4.4 mEq/L (ref 3.5–5.1)

## 2012-07-29 LAB — GLUCOSE, CAPILLARY
Glucose-Capillary: 67 mg/dL — ABNORMAL LOW (ref 70–99)
Glucose-Capillary: 125 mg/dL — ABNORMAL HIGH (ref 70–99)
Glucose-Capillary: 87 mg/dL (ref 70–99)

## 2012-07-29 LAB — CBC
HCT: 31.9 % — ABNORMAL LOW (ref 36.0–46.0)
Hemoglobin: 10.2 g/dL — ABNORMAL LOW (ref 12.0–15.0)
MCH: 28.6 pg (ref 26.0–34.0)
MCHC: 32 g/dL (ref 30.0–36.0)
RDW: 13.4 % (ref 11.5–15.5)

## 2012-07-29 MED ORDER — ENOXAPARIN SODIUM 30 MG/0.3ML ~~LOC~~ SOLN
30.0000 mg | SUBCUTANEOUS | Status: DC
  Administered 2012-07-29: 30 mg via SUBCUTANEOUS
  Filled 2012-07-29: qty 0.3

## 2012-07-29 MED ORDER — ALBUTEROL SULFATE (5 MG/ML) 0.5% IN NEBU
2.5000 mg | INHALATION_SOLUTION | Freq: Four times a day (QID) | RESPIRATORY_TRACT | Status: DC
  Administered 2012-07-29 – 2012-07-31 (×9): 2.5 mg via RESPIRATORY_TRACT
  Filled 2012-07-29 (×9): qty 0.5

## 2012-07-29 MED ORDER — IPRATROPIUM BROMIDE 0.02 % IN SOLN
0.5000 mg | Freq: Four times a day (QID) | RESPIRATORY_TRACT | Status: DC
  Administered 2012-07-29 – 2012-07-31 (×9): 0.5 mg via RESPIRATORY_TRACT
  Filled 2012-07-29 (×9): qty 2.5

## 2012-07-29 MED ORDER — HYDROXYZINE HCL 25 MG PO TABS
25.0000 mg | ORAL_TABLET | Freq: Three times a day (TID) | ORAL | Status: DC | PRN
  Administered 2012-07-29 – 2012-07-31 (×2): 25 mg via ORAL
  Filled 2012-07-29 (×2): qty 1

## 2012-07-29 MED ORDER — DEXTROSE 50 % IV SOLN
0.5000 | Freq: Once | INTRAVENOUS | Status: AC
  Administered 2012-07-29: 25 mL via INTRAVENOUS
  Filled 2012-07-29: qty 50

## 2012-07-29 MED ORDER — DEXTROSE-NACL 5-0.9 % IV SOLN
INTRAVENOUS | Status: DC
  Administered 2012-07-29: 13:00:00 via INTRAVENOUS

## 2012-07-29 NOTE — Progress Notes (Signed)
Combivent inhaler has been changed to albuterol/atrovent neb treatment due to PT lethargy.

## 2012-07-29 NOTE — Progress Notes (Signed)
[facility] PROGRESS NOTE  [name] AVW:[id number] DOB: [birth year] DOA: 07/28/2012 PCP: Syliva Overman, MD  Assessment/Plan: 1. Altered mental status.  Possibly related to dehydration.  Patient has received iv fluids overnight.  Will need to monitor response today. If she does not improve today, then we can consider further imaging.  Work up thus far has been unremarkable.  TSH/B12 is pending. 2. Dehydration.  On iv fluids 3. COPD. On home oxygen.  Continue nebs. 4. Hyperthyroidism.  Checking tsh 5. Htn.  Stable 6. Hypoglycemia.  Possibly due to decreased po intake.  Resolved this am. 7. Disposition.  Will need to discuss ALF placement pending patient progression.  Code Status: DNR Family Communication: discussed with son at the bedside Disposition Plan: possible placement, pending hospital course   Consultants:  none  Procedures:  none  Antibiotics:  none  HPI/Subjective: Family reports patient was awake all night.  She is somnolent at this time. She knows she is in the hospital.  Quickly falls back asleep during questions.  Objective: Filed Vitals:   07/28/12 2146 07/29/12 0218 07/29/12 0500 07/29/12 0711  BP:   142/49   Pulse:   62   Temp:   97.5 F (36.4 C)   TempSrc:   Oral   Resp:   18   Height:      Weight:      SpO2: 98% 98% 99% 95%    Intake/Output Summary (Last 24 hours) at 07/29/12 0931 Last data filed at 07/29/12 0825  Gross per 24 hour  Intake    100 ml  Output      0 ml  Net    100 ml   Filed Weights   07/28/12 0738 07/28/12 1520  Weight: 45.36 kg (100 lb) 41.368 kg (91 lb 3.2 oz)    Exam:   General:  Somnolent, answers some questions, may still have some confusion  Cardiovascular: s1, s2, rrr  Respiratory: crackles at bases  Abdomen: soft, nt, nd, bs+  Musculoskeletal: no lower extremity edema    Data Reviewed: Basic Metabolic Panel:  Recent Labs Lab 07/28/12 0831 07/29/12 0432  NA 137 144  K 3.7 4.4  CL 96 109   CO2 33* 26  GLUCOSE 106* 58*  BUN 10 8  CREATININE 1.08 0.89  CALCIUM 10.4 8.8   Liver Function Tests:  Recent Labs Lab 07/28/12 0831  AST 17  ALT 7  ALKPHOS 124*  BILITOT 0.5  PROT 7.7  ALBUMIN 3.9    Recent Labs Lab 07/28/12 0831  LIPASE 27    Recent Labs Lab 07/28/12 1848  AMMONIA 13   CBC:  Recent Labs Lab 07/28/12 0831 07/29/12 0432  WBC 9.3 6.5  NEUTROABS 6.1  --   HGB 13.7 10.2*  HCT 41.2 31.9*  MCV 87.1 89.4  PLT 333 249   Cardiac Enzymes: No results found for this basename: CKTOTAL, CKMB, CKMBINDEX, TROPONINI,  in the last 168 hours BNP (last 3 results) No results found for this basename: PROBNP,  in the last 8760 hours CBG:  Recent Labs Lab 07/29/12 0757  GLUCAP 92    No results found for this or any previous visit (from the past 240 hour(s)).   Studies: Dg Chest 1 View  07/28/2012   *RADIOLOGY REPORT*  Clinical Data: Altered level of consciousness.  CHEST - 1 VIEW  Comparison: Chest x-ray 05/16/2012.  Findings: Lung volumes are normal.  No consolidative airspace disease.  No pleural effusions.  No pneumothorax.  No pulmonary nodule  or mass noted.  Pulmonary vasculature and the cardiomediastinal silhouette are within normal limits. Atherosclerosis in the thoracic aorta.  Status post median sternotomy.  IMPRESSION: 1. No radiographic evidence of acute cardiopulmonary disease. 2.  Atherosclerosis.   Original Report Authenticated By: Trudie Reed, M.D.   Ct Head Wo Contrast  07/28/2012   *RADIOLOGY REPORT*  Clinical Data: Altered mental status.  CT HEAD WITHOUT CONTRAST  Technique:  Contiguous axial images were obtained from the base of the skull through the vertex without contrast.  Comparison: MRI of the brain 08/16/2010.  Findings: There are patchy and confluent areas of decreased attenuation throughout the deep and periventricular white matter of the cerebral hemispheres bilaterally, compatible with chronic microvascular ischemic disease. No  acute intracranial abnormalities.  Specifically, no evidence of acute intracranial hemorrhage, no definite findings of acute/subacute cerebral ischemia, no mass, mass effect, hydrocephalus or abnormal intra or extra-axial fluid collections.  Visualized paranasal sinuses and mastoids are well pneumatized.  No acute displaced skull fractures are identified.  IMPRESSION: 1.  No acute intracranial abnormalities. 2.  Chronic microvascular ischemic changes in the cerebral white matter, as above.   Original Report Authenticated By: Trudie Reed, M.D.    Scheduled Meds: . albuterol  2.5 mg Nebulization Q6H  . antiseptic oral rinse  15 mL Mouth Rinse BID  . aspirin EC  81 mg Oral Daily  . enoxaparin (LOVENOX) injection  40 mg Subcutaneous Q24H  . influenza  inactive virus vaccine  0.5 mL Intramuscular Once  . ipratropium  0.5 mg Nebulization Q6H  . losartan  12.5 mg Oral Daily  . methimazole  5 mg Oral [id number]  . mometasone-formoterol  2 puff Inhalation BID  . pantoprazole  40 mg Oral Daily  . propranolol  20 mg Oral BID  . tiotropium  18 mcg Inhalation Daily   Continuous Infusions: . 0.9 % NaCl with KCl 20 mEq / L 125 mL/hr at 07/28/12 1851    Principal Problem:   Altered mental status Active Problems:   HYPERLIPIDEMIA   Essential hypertension, benign   CHRONIC OBSTRUCTIVE PULMONARY DISEASE   Hyperthyroidism   Dehydration    Time spent:    MEMON,JEHANZEB  [facility] Pager [phone]. If 7PM-7AM, please contact night-coverage at [email or web address], password [facility] 07/29/2012, 9:31 AM  LOS: 1 day

## 2012-07-29 NOTE — Progress Notes (Signed)
Inpatient Diabetes Program Recommendations  AACE/ADA: New Consensus Statement on Inpatient Glycemic Control (2013)  Target Ranges:  Prepandial:   less than 140 mg/dL      Peak postprandial:   less than 180 mg/dL (1-2 hours)      Critically ill patients:  140 - 180 mg/dL   Results for ASALEE, BARRETTE (MRN [id number]) as of 07/29/2012 07:41  Ref. Range 07/28/2012 08:31 07/29/2012 04:32  Glucose Latest Range: 70-99 mg/dL 027 (H) 58 (L)    Note: Patient does not have a documented history of diabetes.  Noted blood glucose of 58 mg/dl this morning at 2:53 am.  May want to consider starting IVF with dextrose to prevent hypoglycemic episodes if patient is not eating well.    Thanks, Orlando Penner, RN, MSN, CCRN Diabetes Coordinator Inpatient Diabetes Program [phone]

## 2012-07-29 NOTE — Care Management Note (Unsigned)
    Page 1 of 1   07/31/2012     3:07:47 PM   CARE MANAGEMENT NOTE 07/31/2012  Patient:  [name], [name]   Account Number:  [id number]  Date Initiated:  07/29/2012  Documentation initiated by:  Anibal Henderson  Subjective/Objective Assessment:   admitted from home with altered mental status. Patient lives alone and is fairly independent, but has family available to assist. Patient has oxygen at home.Sister-in-law and friend Mardene Celeste is in the room     Action/Plan:   patient is still somewhat confused, and family is thinking she may need to go to [name] facility if she remains confused. may need home health if returns home. social worker involved   Anticipated DC Date:  07/31/2012   Anticipated DC Plan:  SKILLED NURSING FACILITY  In-house referral  Clinical Social Worker      DC Planning Services  CM consult      Choice offered to / List presented to:             Status of service:  Completed, signed off Medicare Important Message given?  YES (If response is "NO", the following Medicare IM given date fields will be blank) Date Medicare IM given:  07/31/2012 Date Additional Medicare IM given:    Discharge Disposition:    Per UR Regulation:  Reviewed for med. necessity/level of care/duration of stay  If discussed at Long Length of Stay Meetings, dates discussed:    Comments:  07/29/12 1115 Anibal Henderson RN/CM

## 2012-07-29 NOTE — Progress Notes (Signed)
Hypoglycemic Event  CBG: 67  Treatment: D50 IV 25 mL  Symptoms: None  Follow-up CBG: Time:1330 CBG Result:125  Possible Reasons for Event: Inadequate meal intake  Comments/MD notified: Dr. Kerry Hough notified, new orders for D5 25ml IV now and change fluids to D5NS @ 50cc/hr due to patient refusing to eat or drink.     [name]  Remember to initiate Hypoglycemia Order Set & complete

## 2012-07-29 NOTE — Progress Notes (Signed)
UR Chart Review Completed  

## 2012-07-29 NOTE — Clinical Social Work Psychosocial (Signed)
Clinical Social Work Department BRIEF PSYCHOSOCIAL ASSESSMENT 07/29/2012  Patient:  [name], [name]     Account Number:  [id number]     Admit date:  07/28/2012  Clinical Social Worker:  Santa Genera, CLINICAL SOCIAL WORKER  Date/Time:  07/29/2012 12:00 N  Referred by:  Physician  Date Referred:  07/29/2012 Referred for  ALF Placement   Other Referral:   Interview type:  Family Other interview type:   Spoke w son, financial POA, and sister in Social worker at bedside. Patient has dementia and unable to fully participate in assessment.    PSYCHOSOCIAL DATA Living Status:  FAMILY Admitted from facility:   Level of care:   Primary support name:  Lyndel Safe Primary support relationship to patient:  CHILD, ADULT Degree of support available:   Family involved, but patient resists care and intervention    CURRENT CONCERNS Current Concerns  Post-Acute Placement   Other Concerns:    SOCIAL WORK ASSESSMENT / PLAN CSW attempted to meet w patient, patient has dementia and alterned mental status due to medical issues and unable to participate in assessment at present.  Per son, he is financial POA but not healthcare POA.  Patient has refused to give consent for healthcare POA assignment.  Patient lives independently, but has recently had sister move in to assist w care.  Sister may not be able to fully assist patient due to her own medical/health concerns.  Sister in law lives next door and states she spends 6 - 8 hours/day in home providing care.  Says living conditions are not ideal, describes patient as hoarder and w resistance to getting medical needs addressed.  Patient has PCP, Dr [name], but last saw her 2 years ago.  Family makes appointments and patient cancels them.  Per sister in law, patient last had a full bath one year ago when sister in law assisted.    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  Sister in law describes her as quite strong willed.  Thus far, she has resisted  many attempts to provide additional help and care.  Patient says she wants to return home, can become somewhat irritable w family members.  Sister in law says that patient refuses to use walker or cane and is barely able to walk a few feet at home.  Says that patient tends to "perk up" around medical personnel and she may be displaying more function in hospital than she does at home.    Per son, he does not know patient's monthly income. Patient has not applied for Medicaid and son has only recently acquired bank information.  Son's wife is Production designer, theatre/television/film at [place] in [place], assessed patient last night and felt that she had lost a significant amount of function and would need SNF rehab prior to ALF placement.    At this point, CSW will await determination of who is decision maker at present.  If patient is competent and desires to return home at discharge, patient's needs would be addressed by RN CM.  CSW will return tomorrow to talk w son and patient regarding ALF placement; patient would be private pay for ALF.   Assessment/plan status:  Psychosocial Support/Ongoing Assessment of Needs Other assessment/ plan:   Information/referral to community resources:   ALF list    PATIENT'S/FAMILY'S RESPONSE TO PLAN OF CARE: Unable to assess patient wishes, family concerned that patient is not able to care for herself indepedently but patient resists all attempts at providing care needed.  Santa Genera, LCSW Clinical Social Worker [phone])

## 2012-07-29 NOTE — Evaluation (Signed)
Physical Therapy Evaluation Patient Details Name: [name] MRN: [id number] DOB: 10-12-1940 Today's Date: 07/29/2012 Time: 7846-9629 PT Time Calculation (min): 20 min  PT Assessment / Plan / Recommendation Clinical Impression  Pt was seen for evaluation.  She was quite sleepy but cooperative and seemed to be fairly lucid.  She reports that she lives alone and her sister lives in the house next door.  She states that she is independent in all ADLs.  From the eval this AM, it would appear that she is at functional baseline.  I did not see any difficutly with transfers or gait instability.  Because of her dementia, I think that she should have maximal supervision and should not be living alone.  She would be very appropriate for ACLF at d/c, but not for SNF.    PT Assessment  Patent does not need any further PT services    Follow Up Recommendations  No PT follow up    Does the patient have the potential to tolerate intense rehabilitation      Barriers to Discharge        Equipment Recommendations  None recommended by PT    Recommendations for Other Services     Frequency      Precautions / Restrictions Precautions Precautions: None Restrictions Weight Bearing Restrictions: No   Pertinent Vitals/Pain       Mobility  Bed Mobility Bed Mobility: Supine to Sit;Sit to Supine Supine to Sit: 7: Independent;HOB flat Sit to Supine: 7: Independent;HOB flat Transfers Transfers: Sit to Stand;Stand to Sit Sit to Stand: 7: Independent;From bed Stand to Sit: 7: Independent;To bed Ambulation/Gait Ambulation/Gait Assistance: 7: Independent Ambulation Distance (Feet): 80 Feet Assistive device: None Gait Pattern: Within Functional Limits;Trunk flexed;Narrow base of support Gait velocity: slow but steady General Gait Details: pt states that she is O2 dependent and no portable O2 carrier was available  to have her ambulate in the hallway or stairs....she clearly could have ambulated more  and I feel confident that she would not have had any difficulty climbing steps Stairs: No    Exercises     PT Diagnosis:    PT Problem List:   PT Treatment Interventions:     PT Goals    Visit Information  Last PT Received On: 07/29/12    Subjective Data  Subjective: I'm sleepy Patient Stated Goal: none stated   Prior Functioning  Home Living Lives With: Alone Available Help at Discharge: Family;Available PRN/intermittently Type of Home: House Home Access: Stairs to enter [employer] of Steps: pt doesn't remember Home Layout: One level Home Adaptive Equipment: Straight cane;Walker - rolling Prior Function Level of Independence: Independent Able to Take Stairs?: Yes Driving: Yes Vocation: Retired Comments: pt appears fairly lucid this AM and I assume that the info above is correct Communication Communication: No difficulties    Cognition  Cognition Arousal/Alertness: Lethargic Behavior During Therapy: WFL for tasks assessed/performed Overall Cognitive Status: History of cognitive impairments - at baseline    Extremity/Trunk Assessment Right Lower Extremity Assessment RLE ROM/Strength/Tone: WFL for tasks assessed RLE Sensation: WFL - Light Touch RLE Coordination: WFL - gross motor Left Lower Extremity Assessment LLE ROM/Strength/Tone: WFL for tasks assessed LLE Sensation: WFL - Light Touch LLE Coordination: WFL - gross motor Trunk Assessment Trunk Assessment: Normal   Balance Balance Balance Assessed:  (WNL by functional observation)  End of Session PT - End of Session Equipment Utilized During Treatment: Gait belt;Oxygen Activity Tolerance: Patient tolerated treatment well Patient left: in bed;with call  bell/phone within reach;with bed alarm set  GP     Myrlene Broker L 07/29/2012, 9:03 AM

## 2012-07-30 ENCOUNTER — Inpatient Hospital Stay (HOSPITAL_COMMUNITY): Payer: Medicare Other

## 2012-07-30 LAB — BASIC METABOLIC PANEL
CO2: 31 mEq/L (ref 19–32)
Calcium: 8.9 mg/dL (ref 8.4–10.5)
Creatinine, Ser: 0.94 mg/dL (ref 0.50–1.10)
GFR calc Af Amer: 70 mL/min — ABNORMAL LOW (ref >90)
GFR calc non Af Amer: 60 mL/min — ABNORMAL LOW (ref >90)
Sodium: 144 mEq/L (ref 135–145)

## 2012-07-30 LAB — GLUCOSE, CAPILLARY
Glucose-Capillary: 56 mg/dL — ABNORMAL LOW (ref 70–99)
Glucose-Capillary: 92 mg/dL (ref 70–99)

## 2012-07-30 LAB — CBC
MCH: 28.1 pg (ref 26.0–34.0)
MCHC: 31 g/dL (ref 30.0–36.0)
MCV: 90.7 fL (ref 78.0–100.0)
Platelets: 236 10*3/uL (ref 150–400)
RBC: 3.77 MIL/uL — ABNORMAL LOW (ref 3.87–5.11)
RDW: 13.6 % (ref 11.5–15.5)

## 2012-07-30 MED ORDER — ENOXAPARIN SODIUM 40 MG/0.4ML ~~LOC~~ SOLN
40.0000 mg | SUBCUTANEOUS | Status: DC
  Administered 2012-07-30: 40 mg via SUBCUTANEOUS
  Filled 2012-07-30: qty 0.4

## 2012-07-30 NOTE — Progress Notes (Signed)
Hypoglycemic Event  CBG: at 0421 BS was 56, patient alert and oriented, asymptomatic  Treatment: 15 GM carbohydrate snack  Symptoms: None  Follow-up CBG: [id number]:[id number] CBG Result:108  Possible Reasons for Event: Inadequate meal intake  Comments/MD notified:N/A    [name]  Remember to initiate Hypoglycemia Order Set & complete

## 2012-07-30 NOTE — Progress Notes (Signed)
Inpatient Diabetes Program Recommendations  AACE/ADA: New Consensus Statement on Inpatient Glycemic Control (2013)  Target Ranges:  Prepandial:   less than 140 mg/dL      Peak postprandial:   less than 180 mg/dL (1-2 hours)      Critically ill patients:  140 - 180 mg/dL   Results for [name], [name] (MRN [id number]) as of 07/30/2012 08:56  Ref. Range 07/29/2012 12:27 07/29/2012 13:30 07/29/2012 16:51 07/29/2012 19:55 07/30/2012 00:37 07/30/2012 04:21 07/30/2012 04:54 07/30/2012 08:05  Glucose-Capillary Latest Range: 70-99 mg/dL 67 (L) 409 (H) 811 (H) 87 92 56 (L) 108 (H) 101 (H)    Note: Patient does not have a documented history of diabetes.  However, patient is experiencing hypoglycemia.  Noted that patient is receiving IVF with dextrose.  If hypoglycemia continues, may want to consider consulting Dr. Fransico Him.    Thanks, Orlando Penner, RN, MSN, CCRN Diabetes Coordinator Inpatient Diabetes Program [phone]

## 2012-07-30 NOTE — Clinical Social Work Note (Signed)
CSW spoke w patient at bedside and son Jesusita Oka by phone.  Explained PT recommendation for placement at ALF level, explained that patient insurance does not pay for ALF placement and patient would need to pay privately.  Son states that none of facilities patient can afford on her monthly income ($2200/month reportedly) are places that family would place patient.  Do not have enough income to afford places family wants ([facility] [place], [address], [place]).  Family feels that PT evaluation may not reflect patient functional capacity at home, requested another evaluation.  PT will be informed.  Family also requested that CSW fax patient out for SNFs to consider, family may consider paying privately for SNF placement while family completes Medicaid application on patient's behalf.  Patient has limited assets at this point.  Son clearly understands that family may receive a bill for SNF placement if facility cannot bill Medicare, son agreeable to exploring this option.  Patient faxed out to SNFs in [facility].  Patient appears somewhat agreeable to consider options for placement, however clearly states that she wants to return home and "go to work" - was unable to say where she was working.  Son says that patient did not recognize him yesterday and could not find the bathroom in her home of 10 years over the weekend.  Family has noted a progressive decline in cognitive function over several years, however patient has insisted on living on her own and refused offers of help.  Santa Genera, LCSW Clinical Social Worker [phone])

## 2012-07-30 NOTE — Clinical Social Work Note (Signed)
[facility] to meet with pt and son this afternoon at son's request to explore all options.   Derenda Fennel, [place] [phone]

## 2012-07-30 NOTE — Progress Notes (Signed)
[facility] PROGRESS NOTE  [name]:[id number] DOB: 1940-05-18 DOA: 07/28/2012 PCP: Sabrina Overman, MD  Assessment/Plan: 1. Altered mental status.  Patient possibly has some underlying mild cognitive impairment/early dementia. This was likely exacerbated by dehydration. She is now rehydrated her mental status appears to be approaching baseline. Her sister reports that she's had some memory impairment for the past few months which is progressively gotten worse. Workup has been unremarkable in terms of metabolic causes. We will check MRI of her brain to rule out any structural problems. If imaging is otherwise unremarkable, we may be able to start her on Aricept and a followup with her primary care physician 2. Dehydration.  On iv fluids 3. COPD. On home oxygen.  Continue nebs. 4. Hyperthyroidism.  Checking tsh 5. Htn.  Stable 6. Hypoglycemia.  Likely due to decreased by mouth intake. Patient does report nausea which is why she has not been eating. We will give her antiemetics. She's been encouraged to eat. We'll discontinue the dextrose infusion and follow CBGs. 7. Disposition.  Family is looking towards placement, social work is involved. Anticipate that she'll be ready for discharge in the next 24 hours.  Code Status: DNR Family Communication: discussed with son at the bedside Disposition Plan: possible placement, pending hospital course   Consultants:  none  Procedures:  none  Antibiotics:  none  HPI/Subjective: Patient is more awake and alert today. She is able to answer questions. She does have some memory impairment. She is oriented to place, time, person. She feels her biggest problem is her memory impairment. She was noted to be hypoglycemic overnight  Objective: Filed Vitals:   07/29/12 1856 07/29/12 2115 07/30/12 0625 07/30/12 0651  BP:  125/47 139/62   Pulse:  61 64   Temp:  98 F (36.7 C) 97.6 F (36.4 C)   TempSrc:  Oral Oral   Resp:  20 20    Height:      Weight:      SpO2: 89% 100% 100% 100%    Intake/Output Summary (Last 24 hours) at 07/30/12 1153 Last data filed at 07/30/12 1040  Gross per 24 hour  Intake 1494.17 ml  Output      5 ml  Net 1489.17 ml   Filed Weights   07/28/12 0738 07/28/12 1520  Weight: 45.36 kg (100 lb) 41.368 kg (91 lb 3.2 oz)    Exam:   General:  Awake, NAD  Cardiovascular: s1, s2, rrr  Respiratory: cta b  Abdomen: soft, nt, nd, bs+  Musculoskeletal: no lower extremity edema    Data Reviewed: Basic Metabolic Panel:  Recent Labs Lab 07/28/12 0831 07/29/12 0432 07/30/12 0453  NA 137 144 144  K 3.7 4.4 4.0  CL 96 109 108  CO2 33* 26 31  GLUCOSE 106* 58* 129*  BUN 10 8 6   CREATININE 1.08 0.89 0.94  CALCIUM 10.4 8.8 8.9   Liver Function Tests:  Recent Labs Lab 07/28/12 0831  AST 17  ALT 7  ALKPHOS 124*  BILITOT 0.5  PROT 7.7  ALBUMIN 3.9    Recent Labs Lab 07/28/12 0831  LIPASE 27    Recent Labs Lab 07/28/12 1848  AMMONIA 13   CBC:  Recent Labs Lab 07/28/12 0831 07/29/12 0432 07/30/12 0453  WBC 9.3 6.5 6.5  NEUTROABS 6.1  --   --   HGB 13.7 10.2* 10.6*  HCT 41.2 31.9* 34.2*  MCV 87.1 89.4 90.7  PLT 333 249 236   Cardiac Enzymes: No results  found for this basename: CKTOTAL, CKMB, CKMBINDEX, TROPONINI,  in the last 168 hours BNP (last 3 results) No results found for this basename: PROBNP,  in the last 8760 hours CBG:  Recent Labs Lab 07/30/12 0037 07/30/12 0421 07/30/12 0454 07/30/12 0805 07/30/12 1118  GLUCAP 92 56* 108* 101* 96    No results found for this or any previous visit (from the past 240 hour(s)).   Studies: No results found.  Scheduled Meds: . albuterol  2.5 mg Nebulization Q6H  . antiseptic oral rinse  15 mL Mouth Rinse BID  . aspirin EC  81 mg Oral Daily  . enoxaparin (LOVENOX) injection  40 mg Subcutaneous Q24H  . influenza  inactive virus vaccine  0.5 mL Intramuscular Once  . ipratropium  0.5 mg Nebulization  Q6H  . losartan  12.5 mg Oral Daily  . methimazole  5 mg Oral BH-[id number]  . mometasone-formoterol  2 puff Inhalation BID  . pantoprazole  40 mg Oral Daily  . tiotropium  18 mcg Inhalation Daily   Continuous Infusions:    Principal Problem:   Altered mental status Active Problems:   HYPERLIPIDEMIA   Essential hypertension, benign   CHRONIC OBSTRUCTIVE PULMONARY DISEASE   Hyperthyroidism   Dehydration    Time spent:    [name]  [facility] Pager [phone]. If 7PM-7AM, please contact night-coverage at [email or web address], password [facility] 07/30/2012, 11:53 AM  LOS: 2 days

## 2012-07-30 NOTE — Clinical Social Work Placement (Signed)
    Clinical Social Work Department CLINICAL SOCIAL WORK PLACEMENT NOTE 07/31/2012  Patient:  [name], [name]  Account Number:  [id number] Admit date:  07/28/2012  Clinical Social Worker:  Santa Genera, CLINICAL SOCIAL WORKER  Date/time:  07/30/2012 11:00 AM  Clinical Social Work is seeking post-discharge placement for this patient at the following level of care:   SKILLED NURSING   (*CSW will update this form in Epic as items are completed)   07/30/2012  Patient/family provided with [facility] Department of Clinical Social Work's list of facilities offering this level of care within the geographic area requested by the patient (or if unable, by the patient's family).  07/30/2012  Patient/family informed of their freedom to choose among providers that offer the needed level of care, that participate in Medicare, Medicaid or managed care program needed by the patient, have an available bed and are willing to accept the patient.  07/30/2012  Patient/family informed of [facility]' ownership interest in [facility], as well as of the fact that they are under no obligation to receive care at this facility.  PASARR submitted to EDS on 07/30/2012 PASARR number received from EDS on 07/30/2012  FL2 transmitted to all facilities in geographic area requested by pt/family on  07/30/2012 FL2 transmitted to all facilities within larger geographic area on   Patient informed that his/her managed care company has contracts with or will negotiate with  certain facilities, including the following:     Patient/family informed of bed offers received:  07/31/2012 Patient chooses bed at [facility] [place] Physician recommends and patient chooses bed at    Patient to be transferred to [facility] [place] on  07/31/2012 Patient to be transferred to facility by Family will transport  The following physician request were entered in Epic:   Additional Comments: Family has requested SNF  bed search, understand that they may need to pay privately if placement is chosen.  Santa Genera, LCSW Clinical Social Worker [phone])

## 2012-07-30 NOTE — Clinical Social Work Note (Signed)
[facility] would like to make bed offer, but wants to do face to face evaluation of patient first.  CSW advised that, per supervisor, facility admissions and PT could only talk to patient, with patient's permission.  CSW will discuss w patient and family and advise [facility].  Santa Genera, LCSW Clinical Social Worker [phone])

## 2012-07-31 DIAGNOSIS — N289 Disorder of kidney and ureter, unspecified: Secondary | ICD-10-CM | POA: Diagnosis not present

## 2012-07-31 DIAGNOSIS — E785 Hyperlipidemia, unspecified: Secondary | ICD-10-CM | POA: Diagnosis not present

## 2012-07-31 DIAGNOSIS — I10 Essential (primary) hypertension: Secondary | ICD-10-CM

## 2012-07-31 DIAGNOSIS — M6281 Muscle weakness (generalized): Secondary | ICD-10-CM | POA: Diagnosis not present

## 2012-07-31 DIAGNOSIS — R293 Abnormal posture: Secondary | ICD-10-CM | POA: Diagnosis not present

## 2012-07-31 DIAGNOSIS — R279 Unspecified lack of coordination: Secondary | ICD-10-CM | POA: Diagnosis not present

## 2012-07-31 DIAGNOSIS — J449 Chronic obstructive pulmonary disease, unspecified: Secondary | ICD-10-CM | POA: Diagnosis not present

## 2012-07-31 DIAGNOSIS — R488 Other symbolic dysfunctions: Secondary | ICD-10-CM | POA: Diagnosis not present

## 2012-07-31 DIAGNOSIS — E559 Vitamin D deficiency, unspecified: Secondary | ICD-10-CM | POA: Diagnosis not present

## 2012-07-31 DIAGNOSIS — E0590 Thyrotoxicosis, unspecified without thyrotoxic crisis or storm: Secondary | ICD-10-CM | POA: Diagnosis not present

## 2012-07-31 DIAGNOSIS — R4182 Altered mental status, unspecified: Secondary | ICD-10-CM | POA: Diagnosis not present

## 2012-07-31 DIAGNOSIS — F0280 Dementia in other diseases classified elsewhere without behavioral disturbance: Secondary | ICD-10-CM | POA: Diagnosis not present

## 2012-07-31 DIAGNOSIS — I00 Rheumatic fever without heart involvement: Secondary | ICD-10-CM | POA: Diagnosis not present

## 2012-07-31 DIAGNOSIS — E860 Dehydration: Secondary | ICD-10-CM | POA: Diagnosis not present

## 2012-07-31 DIAGNOSIS — I359 Nonrheumatic aortic valve disorder, unspecified: Secondary | ICD-10-CM | POA: Diagnosis not present

## 2012-07-31 LAB — CBC
HCT: 39.3 % (ref 36.0–46.0)
Hemoglobin: 13 g/dL (ref 12.0–15.0)
MCV: 88.3 fL (ref 78.0–100.0)
Platelets: 315 10*3/uL (ref 150–400)
RBC: 4.45 MIL/uL (ref 3.87–5.11)
WBC: 10 10*3/uL (ref 4.0–10.5)

## 2012-07-31 LAB — BASIC METABOLIC PANEL
CO2: 32 mEq/L (ref 19–32)
Calcium: 9.7 mg/dL (ref 8.4–10.5)
Chloride: 101 mEq/L (ref 96–112)
Potassium: 3.5 mEq/L (ref 3.5–5.1)
Sodium: 143 mEq/L (ref 135–145)

## 2012-07-31 LAB — GLUCOSE, CAPILLARY: Glucose-Capillary: 103 mg/dL — ABNORMAL HIGH (ref 70–99)

## 2012-07-31 MED ORDER — DONEPEZIL HCL 5 MG PO TABS: 5.0000 mg | ORAL_TABLET | Freq: Every day | ORAL | Status: DC

## 2012-07-31 MED ORDER — ENOXAPARIN SODIUM 30 MG/0.3ML ~~LOC~~ SOLN: 30.0000 mg | SUBCUTANEOUS | Status: DC

## 2012-07-31 NOTE — Plan of Care (Signed)
Problem: Phase I Progression Outcomes Goal: Initial discharge plan identified Outcome: Completed/Met Date Met:  07/31/12 [facility] at discharge.

## 2012-07-31 NOTE — Clinical Social Work Note (Signed)
Family given bed offers, family wants patient to remain in [place].  [facility] willing to offer bed, will do their own evaluation for OT, PT, Speech at facility.  Family will work w [facility] on preadmission paperwork.  Alerted facility to anticipate patient discharge to their facility later today.  Santa Genera, LCSW Clinical Social Worker [phone])

## 2012-07-31 NOTE — Discharge Summary (Signed)
Physician Discharge Summary  [name]:[id number] DOB: [birth year] DOA: 07/28/2012  PCP: Syliva Overman, MD  Admit date: 07/28/2012 Discharge date: 07/31/2012  Time spent: 40 minutes  Recommendations for Outpatient Follow-up:  1. Patient will be discharged to skilled nursing facility for further care  Discharge Diagnoses:  Principal Problem:   Altered mental status, likely secondary to underlying dementia Active Problems:   HYPERLIPIDEMIA   Essential hypertension, benign   CHRONIC OBSTRUCTIVE PULMONARY DISEASE   Hyperthyroidism   Dehydration   Chronic respiratory failure on home oxygen due to COPD   Hypoglycemia due to poor po intake  Discharge Condition: stable  Diet recommendation: low salt  Filed Weights   07/28/12 0738 07/28/12 1520  Weight: 45.36 kg (100 lb) 41.368 kg (91 lb 3.2 oz)    History of present illness:  [name] is a 72 y.o. female who was brought to the emergency room by her family for alteration in mental status and confusion. Patient is unable to provide any history. Her family reports that she has been increasingly confused, agitated for the past 2-3 days. She's not had a significant fever. No change in cough. No dysuria, vomiting or diarrhea. Her oral intake has been poor. She was having a runny nose. She has had episodes of confusion in the past, but these have been self-limited. Previously they have been attributed to urinary tract infection as well as dehydration. Workup in the emergency room was unremarkable. She does not have any evidence of ongoing infection. She does not appear to be taking any sedative medications. CT of the head was unremarkable. The patient has been referred for admission for further workup.   Hospital Course:  This patient was brought to the hospital by her family for altered mental status. She was exhibiting bizarre behavior, was increasingly confused lethargic. She was found to be clinically dehydrated on admission.  Patient was given IV fluids with improvement in her mental status. At this time, she still remains confused and has some significant memory impairment. She is alert and oriented x3, but often has trouble cancer questions that require memory recall. She often gets frustrated by having this difficulty. Further history from the patient's sister reveals that she's been having these symptoms intermittently for approximately one year now. Her memory impairment has gotten significantly worse the past 2 months. Workup in the hospital was relatively unremarkable. She did not have any evidence of pneumonia chest x-ray and urinalysis was unremarkable. Metabolic workup was also unrevealing. MRI done the brain did not show any acute process. I suspect that her symptoms may be from some underlying dementia that has progressively gotten worse. The patient is having difficulty compensating for her symptoms. I recommended that she follow regularly/closely with her primary care physician. I have also started her on Aricept which can be further adjusted in the outpatient setting. She'll be discharged to a skilled nursing facility for further management.  Procedures:  none  Consultations:  none  Discharge Exam: Filed Vitals:   07/30/12 2213 07/31/12 0533 07/31/12 0803 07/31/12 0948  BP: 150/74 186/84  147/81  Pulse: 66 75  92  Temp: 98 F (36.7 C) 97.4 F (36.3 C)    TempSrc:      Resp: 18 20    Height:      Weight:      SpO2: 90% 99% 86% 98%    General: NAD Cardiovascular: s1, s2, rrr Respiratory: mild exp wheeze b/l  Discharge Instructions  Discharge Orders   Future  Orders Complete By Expires     Diet - low sodium heart healthy  As directed     Increase activity slowly  As directed         Medication List    STOP taking these medications       omeprazole 20 MG capsule  Commonly known as:  PRILOSEC     pantoprazole 40 MG tablet  Commonly known as:  PROTONIX      TAKE these medications        ADULT ASPIRIN EC LOW STRENGTH 81 MG EC tablet  Generic drug:  aspirin  Take 81 mg by mouth every morning.     donepezil 5 MG tablet  Commonly known as:  ARICEPT  Take 1 tablet (5 mg total) by mouth at bedtime.     EPINEPHrine 0.3 mg/0.3 mL Devi  Commonly known as:  EPI-PEN  Inject 0.3 mLs (0.3 mg total) into the muscle once.     FISH OIL CONCENTRATE 1000 MG Caps  Take 1 capsule by mouth 2 (two) times daily.     FLONASE 50 MCG/ACT nasal spray  Generic drug:  fluticasone  Place 2 sprays into the nose daily as needed. Sinus/congestion     Fluticasone-Salmeterol 250-50 MCG/DOSE Aepb  Commonly known as:  ADVAIR  Inhale 1 puff into the lungs 2 (two) times daily.     Ipratropium-Albuterol 20-100 MCG/ACT Aers respimat  Commonly known as:  COMBIVENT  Inhale 1 puff into the lungs every 6 (six) hours.     lansoprazole 15 MG capsule  Commonly known as:  PREVACID  Take 15 mg by mouth daily.     loratadine 10 MG tablet  Commonly known as:  CLARITIN  Take 10 mg by mouth every morning.     losartan 25 MG tablet  Commonly known as:  COZAAR  Take 12.5 mg by mouth daily. If blood pressure levels are normal Patient does not take medication     methimazole 5 MG tablet  Commonly known as:  TAPAZOLE  Take 1 tablet (5 mg total) by mouth every morning.     propranolol 20 MG tablet  Commonly known as:  INDERAL  Take 1 tablet (20 mg total) by mouth 2 (two) times daily.     PROVENTIL HFA 108 (90 BASE) MCG/ACT inhaler  Generic drug:  albuterol  Inhale 2 puffs into the lungs every 6 (six) hours as needed. Shortness of breath     QC CALCIUM/MINERALS/VITAMIN D 600-400 MG-UNIT Tabs  Take 1 tablet by mouth every evening.     tiotropium 18 MCG inhalation capsule  Commonly known as:  SPIRIVA  Place 18 mcg into inhaler and inhale daily.     TRILIPIX 135 MG capsule  Generic drug:  Choline Fenofibrate  Take 135 mg by mouth at bedtime.     vitamin C 500 MG tablet  Commonly known as:   ASCORBIC ACID  Take 500 mg by mouth daily.       Allergies  Allergen Reactions  . Nitrofurantoin Shortness Of Breath    Acute dyspnea  . Bupropion Hcl   . Cyclobenzaprine Hcl   . Gadolinium      Desc: PT REFUSES MR & CT CONTRAST DUE TO PRIOR REACTION, PT STATES HAIR FELL OUT & SKIN CHANGED COLOR   . Levofloxacin   . Lorazepam     Causes mental confusion/breakdown  . Penicillins Other (See Comments)    Child hood allergy  . Statins Other (See Comments)  Joint stiffness  . Sulfonamide Derivatives       The results of significant diagnostics from this hospitalization (including imaging, microbiology, ancillary and laboratory) are listed below for reference.    Significant Diagnostic Studies: Dg Chest 1 View  07/28/2012   *RADIOLOGY REPORT*  Clinical Data: Altered level of consciousness.  CHEST - 1 VIEW  Comparison: Chest x-ray 05/16/2012.  Findings: Lung volumes are normal.  No consolidative airspace disease.  No pleural effusions.  No pneumothorax.  No pulmonary nodule or mass noted.  Pulmonary vasculature and the cardiomediastinal silhouette are within normal limits. Atherosclerosis in the thoracic aorta.  Status post median sternotomy.  IMPRESSION: 1. No radiographic evidence of acute cardiopulmonary disease. 2.  Atherosclerosis.   Original Report Authenticated By: Trudie Reed, M.D.   Ct Head Wo Contrast  07/28/2012   *RADIOLOGY REPORT*  Clinical Data: Altered mental status.  CT HEAD WITHOUT CONTRAST  Technique:  Contiguous axial images were obtained from the base of the skull through the vertex without contrast.  Comparison: MRI of the brain 08/16/2010.  Findings: There are patchy and confluent areas of decreased attenuation throughout the deep and periventricular white matter of the cerebral hemispheres bilaterally, compatible with chronic microvascular ischemic disease. No acute intracranial abnormalities.  Specifically, no evidence of acute intracranial hemorrhage, no  definite findings of acute/subacute cerebral ischemia, no mass, mass effect, hydrocephalus or abnormal intra or extra-axial fluid collections.  Visualized paranasal sinuses and mastoids are well pneumatized.  No acute displaced skull fractures are identified.  IMPRESSION: 1.  No acute intracranial abnormalities. 2.  Chronic microvascular ischemic changes in the cerebral white matter, as above.   Original Report Authenticated By: Trudie Reed, M.D.   Mr Brain Ltd W/o Cm  07/30/2012   *RADIOLOGY REPORT*  Clinical Data: 72 year old female with persisting confusion. Altered mental status.  MRI HEAD WITHOUT CONTRAST  Technique:  Multiplanar, multiecho pulse sequences of the brain and surrounding structures were obtained according to standard protocol without intravenous contrast.  Comparison: Head CT without contrast 07/28/2012.  Brain MRI 08/16/2010.  Findings: The examination had to be discontinued prior to completion due to patient inability to continue, refusal to continue.  Axial diffusion and sagittal T1-weighted imaging was obtained.  No restricted diffusion to suggest acute infarction.  No ventriculomegaly. No midline shift, mass effect, or strong evidence of mass lesion.  Stable and negative appearance of the pituitary, cervicomedullary junction visualized cervical spine.  Normal bone marrow signal.  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  No strong evidence of acute intracranial hemorrhage.  IMPRESSION: 1. The examination had to be discontinued prior to completion. 2.  No evidence of acute infarct.   Original Report Authenticated By: Erskine Speed, M.D.    Microbiology: No results found for this or any previous visit (from the past 240 hour(s)).   Labs: Basic Metabolic Panel:  Recent Labs Lab 07/28/12 0831 07/29/12 0432 07/30/12 0453 07/31/12 0439  NA 137 144 144 143  K 3.7 4.4 4.0 3.5  CL 96 109 108 101  CO2 33* 26 31 32  GLUCOSE 106* 58* 129* 98  BUN 10  8 6 7   CREATININE 1.08 0.89 0.94 0.87  CALCIUM 10.4 8.8 8.9 9.7   Liver Function Tests:  Recent Labs Lab 07/28/12 0831  AST 17  ALT 7  ALKPHOS 124*  BILITOT 0.5  PROT 7.7  ALBUMIN 3.9    Recent Labs Lab 07/28/12 0831  LIPASE 27    Recent Labs Lab 07/28/12 1848  AMMONIA 13   CBC:  Recent Labs Lab 07/28/12 0831 07/29/12 0432 07/30/12 0453 07/31/12 0439  WBC 9.3 6.5 6.5 10.0  NEUTROABS 6.1  --   --   --   HGB 13.7 10.2* 10.6* 13.0  HCT 41.2 31.9* 34.2* 39.3  MCV 87.1 89.4 90.7 88.3  PLT 333 249 236 315   Cardiac Enzymes: No results found for this basename: CKTOTAL, CKMB, CKMBINDEX, TROPONINI,  in the last 168 hours BNP: BNP (last 3 results) No results found for this basename: PROBNP,  in the last 8760 hours CBG:  Recent Labs Lab 07/30/12 1118 07/30/12 1617 07/30/12 2011 07/30/12 2336 07/31/12 0538  GLUCAP 96 100* 100* 100* 103*       Signed:  MEMON,JEHANZEB  [facility] 07/31/2012, 11:41 AM

## 2012-07-31 NOTE — Progress Notes (Signed)
Report called to Katie at [facility].  Katie asked about pt's Cozaar ordered.  States that pt does not take when BPs are normal.  Notified Dr. Kerry Hough via text page for verification of order.

## 2012-07-31 NOTE — Progress Notes (Signed)
Dr. Kerry Hough returned page and stated that the patient should take her Cozaar daily and not on a PRN basis.  Stated that the facility could then make their adjustments from this.  Called Katie at [facility] and notified her of this.  Verbalized understanding.

## 2012-07-31 NOTE — Progress Notes (Signed)
Pt's family here to pick up pt for transport to [facility].  Information packet for [facility] provided to patient's family.  Pt's IV removed.  Site WNL.  Pt transported by NT via w/c to main entrance for discharge.  Pt transported with portable O2 tank to [facility].  Pt stable at time of discharge.

## 2012-07-31 NOTE — Clinical Social Work Note (Signed)
Patient ready for discharge today, will go to [facility]. [facility] agreeable to accepting patient. Discharge summary faxed to [facility] via [facility], FL2 reviewed w RN and updated as needed.  Discharge packet prepared and placed w shadow chart for transport.  Family in room informed and agreeable, says financial POA has gone to [facility] to sign preadmission paperwork.  Family has been told that SNF facility will be assessing patient and determining insurance payment status, aware of our PT recommendation.  Family will transport patient in their car.   CSW signing off as no further SW needs have been identified.  Santa Genera, LCSW Clinical Social Worker [phone])

## 2012-08-01 LAB — CORTISOL-AM, BLOOD: Cortisol - AM: 24.7 ug/dL — ABNORMAL HIGH (ref 4.3–22.4)

## 2012-08-09 IMAGING — CR DG CHEST 2V
2 series · 2 of 2 positions shown · non-contrast
Comparison: 07/19/2010

CLINICAL DATA: Left flank pain.  Congestion.  Abdominal pain.

CHEST - 2 VIEW

[view not recorded (1 of 2)]
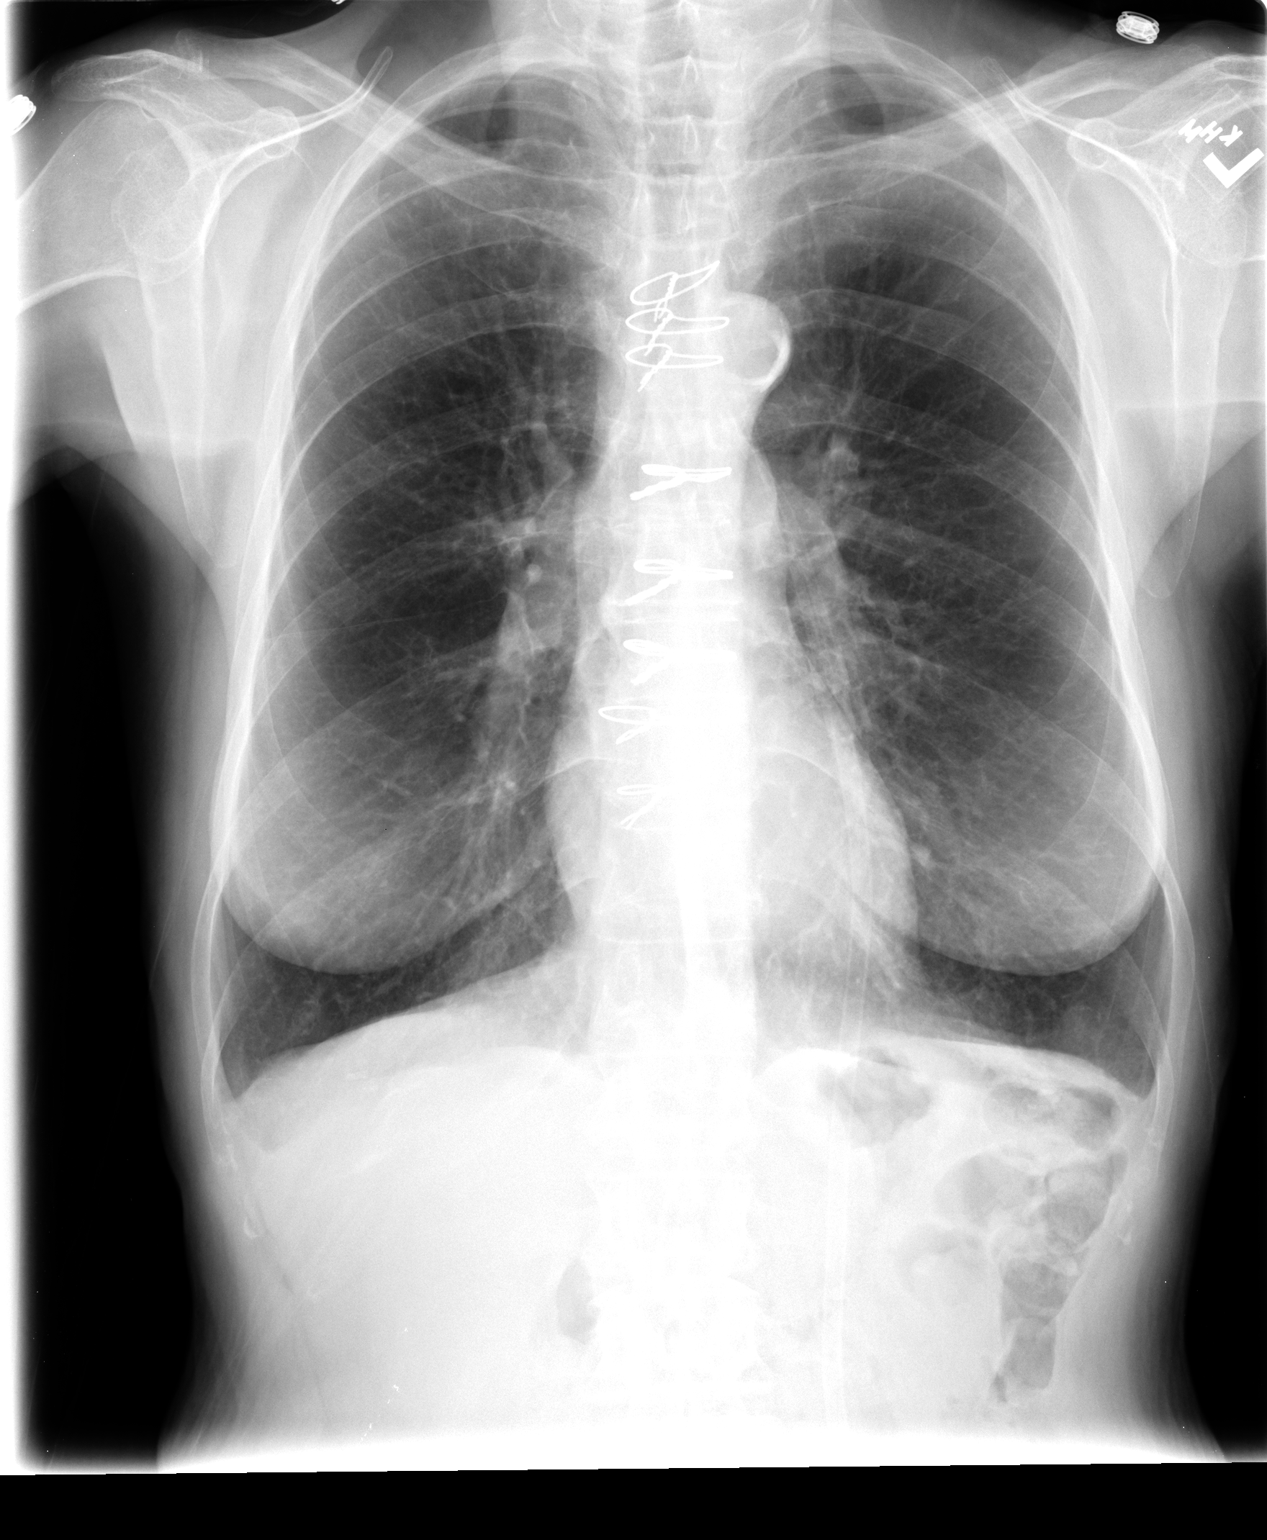

[view not recorded (2 of 2)]
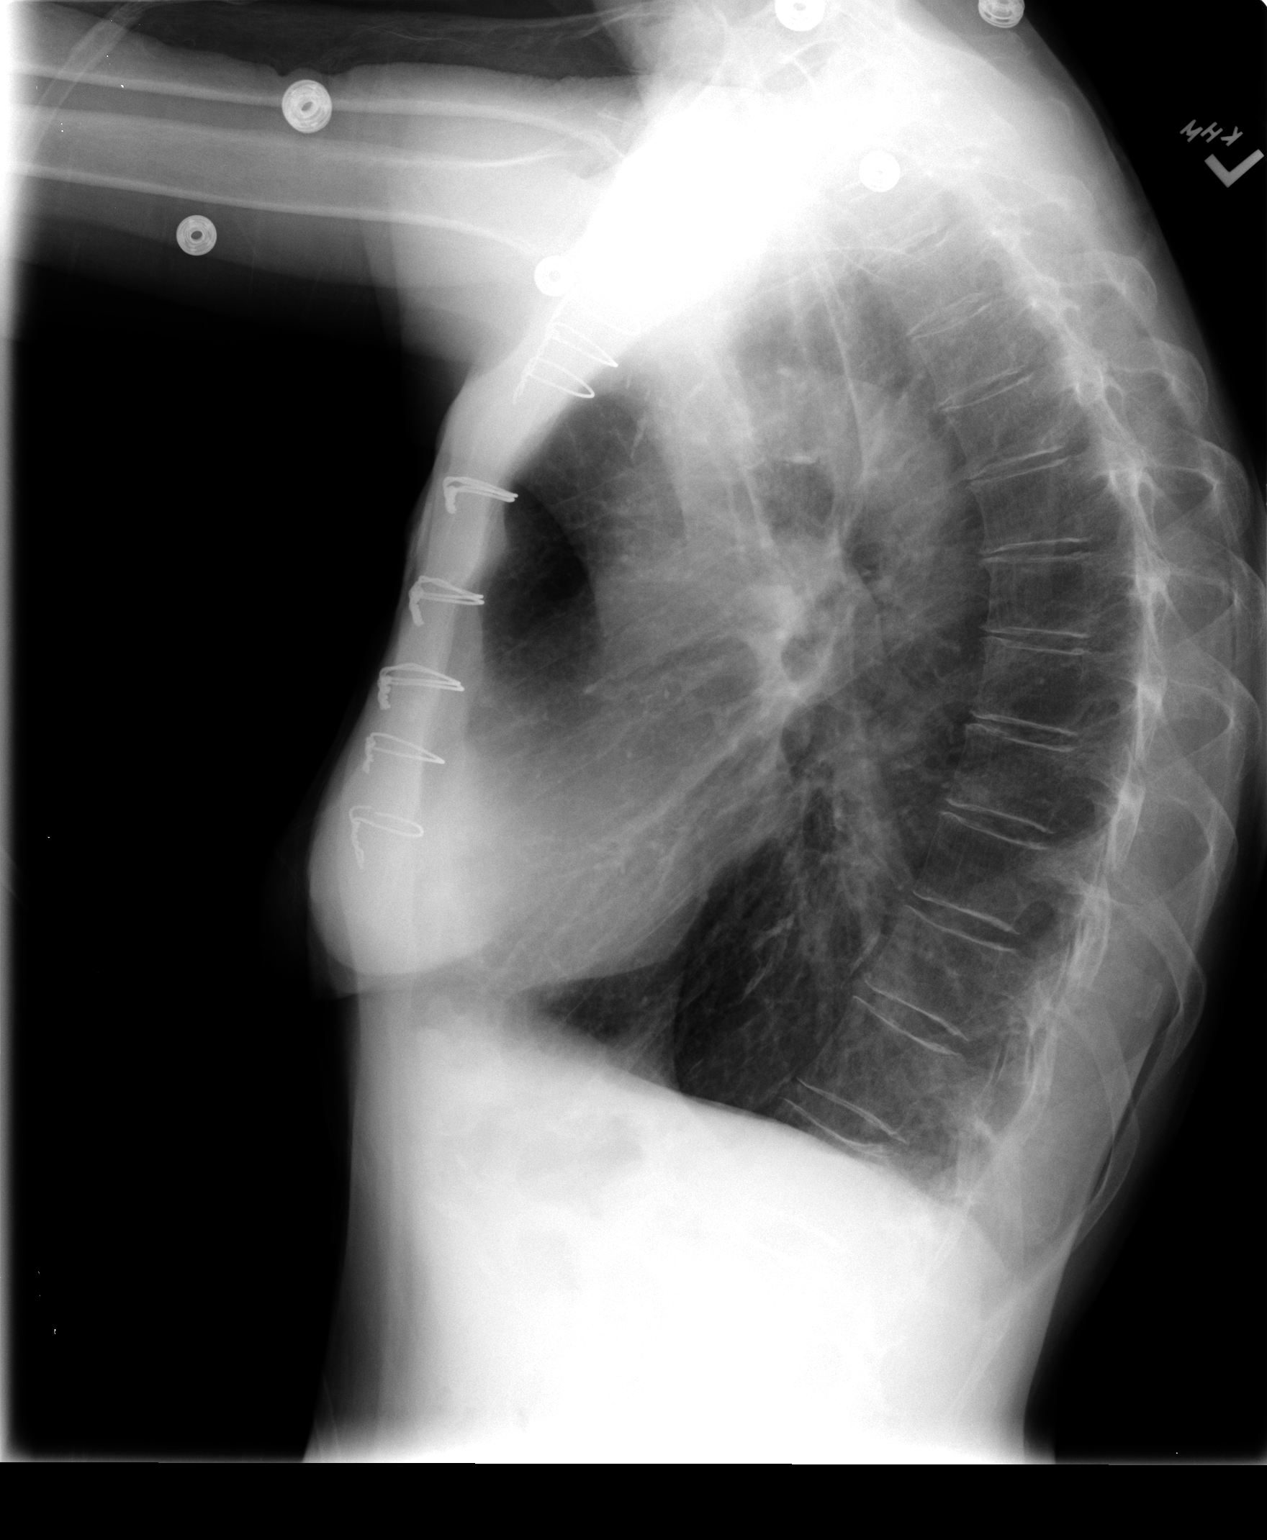

[2 of 2 positions shown; findings below may reference images not displayed]

FINDINGS: Atherosclerotic calcification of the aortic arch noted
along with prior median sternotomy.

Emphysema is present.  No pneumonia or pleural effusion identified.

Heart size appears normal.
IMPRESSION: 1.  Emphysema.
2.  Atherosclerosis.

## 2012-08-20 IMAGING — US US RENAL
1 series · 14 of 25 positions shown · non-contrast
Comparison: CT abdomen 10/20/2009.

CLINICAL DATA: 69-year-old female with recurrent urinary tract
infection.

RENAL/URINARY TRACT ULTRASOUND COMPLETE

[Series 1: us renal · 0.25mm/px · 14 of 39 slices shown]
[im 1/39]
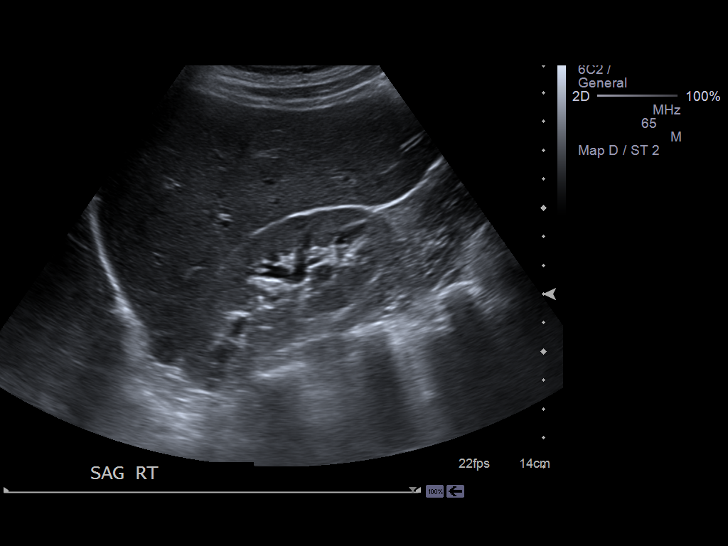
[im 4/39]
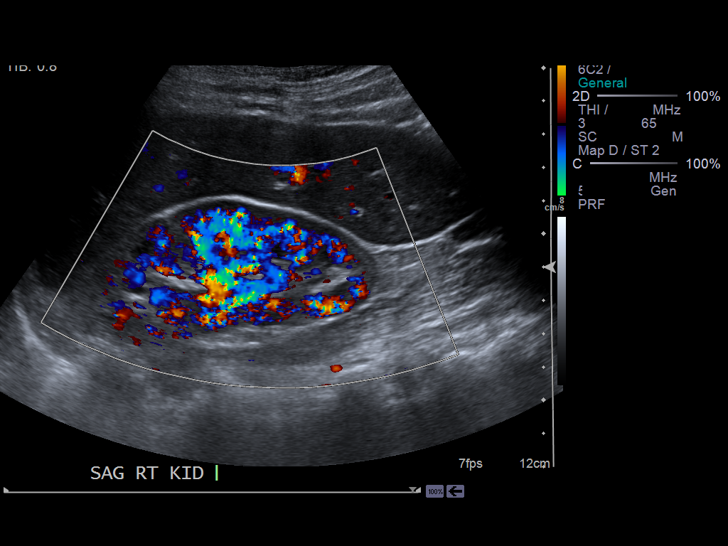
[im 7/39]
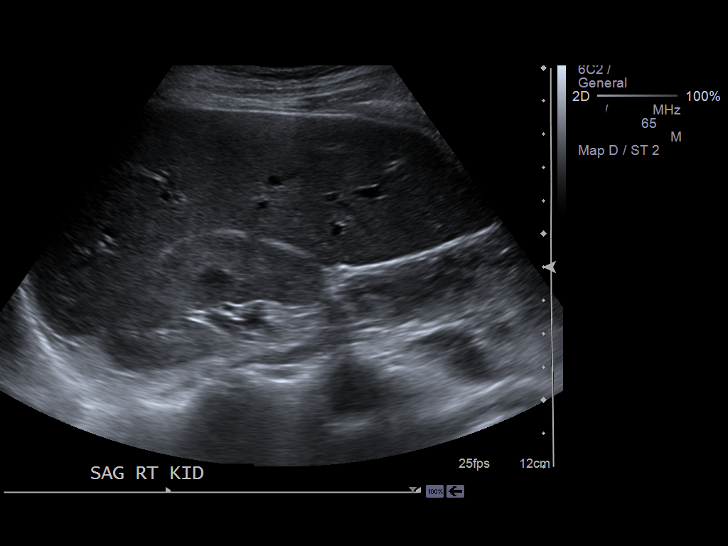
[im 10/39]
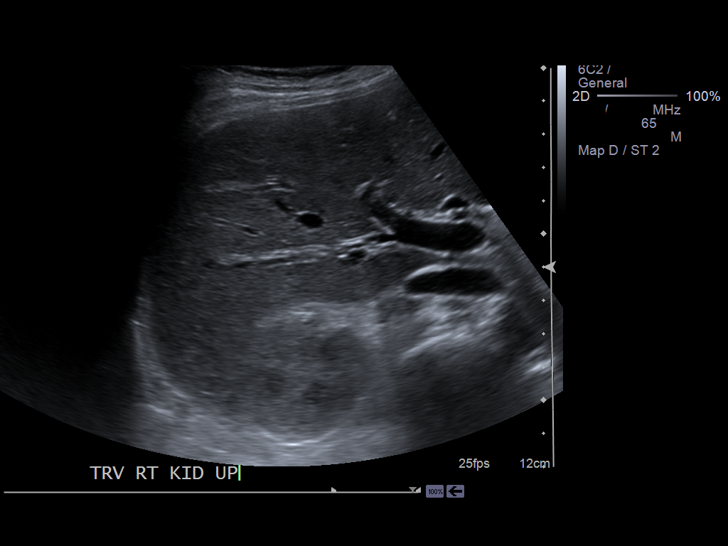
[im 13/39]
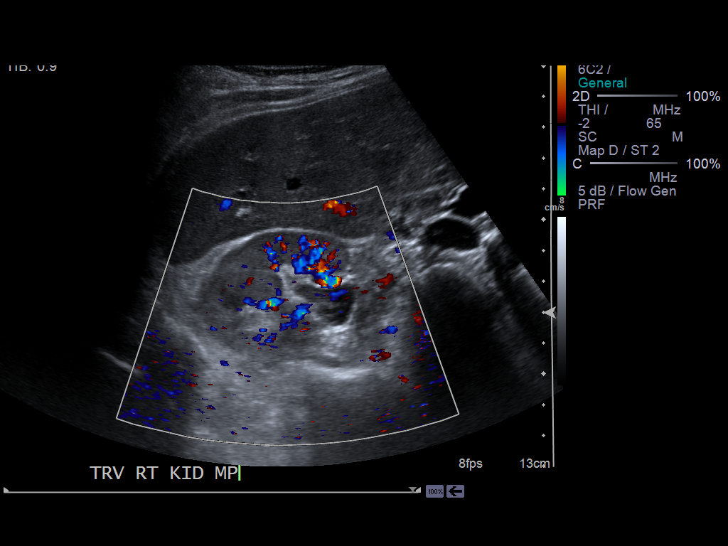
[im 15/39]
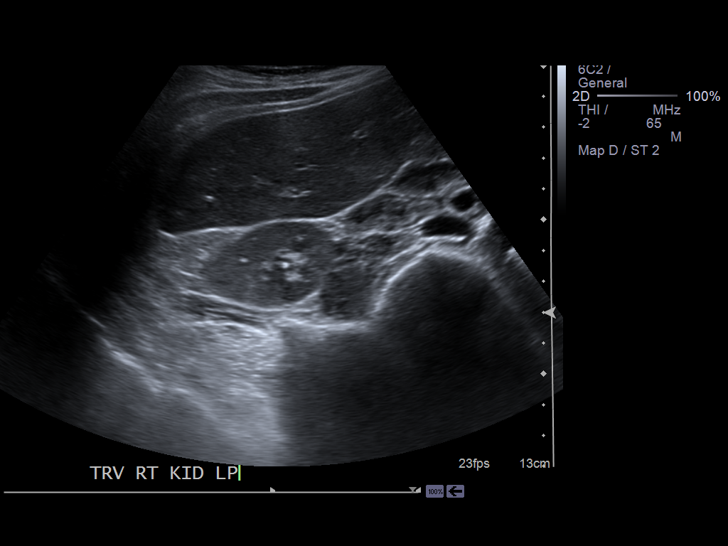
[im 18/39]
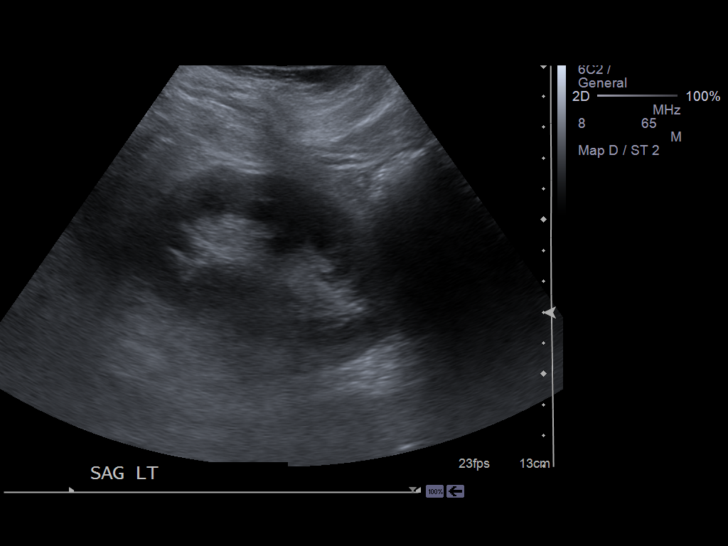
[im 21/39]
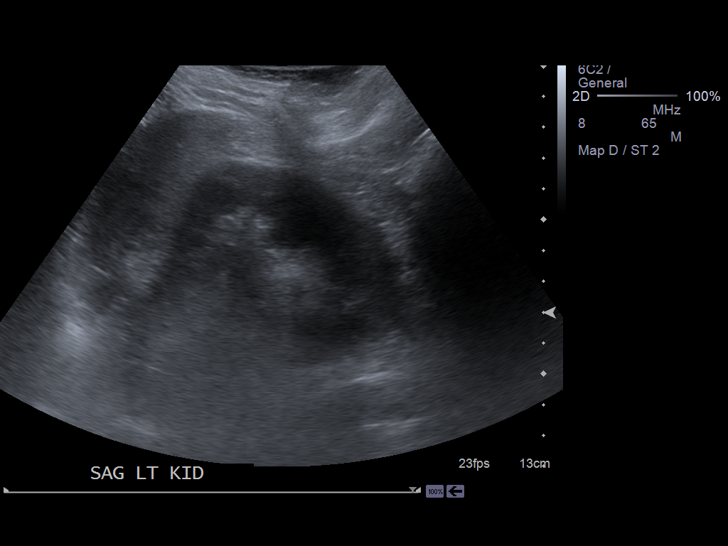
[im 24/39]
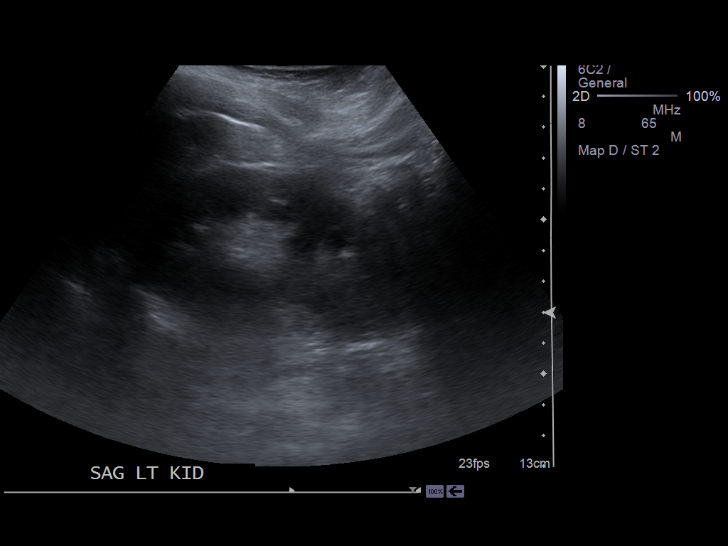
[im 26/39]
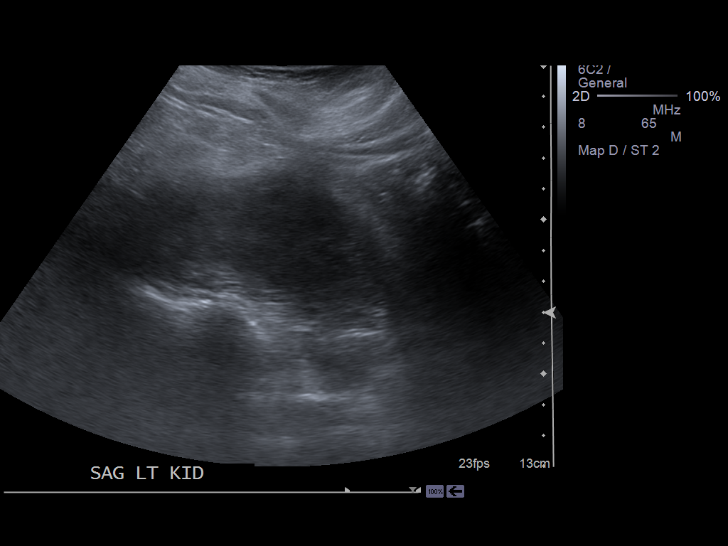
[im 29/39]
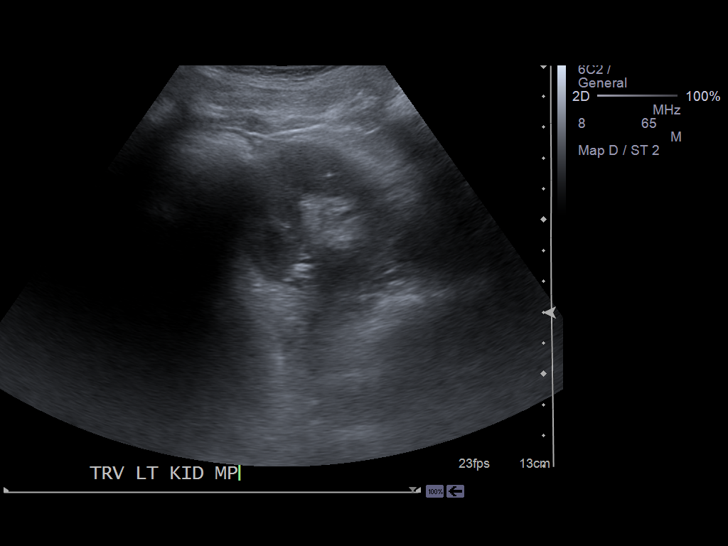
[im 32/39]
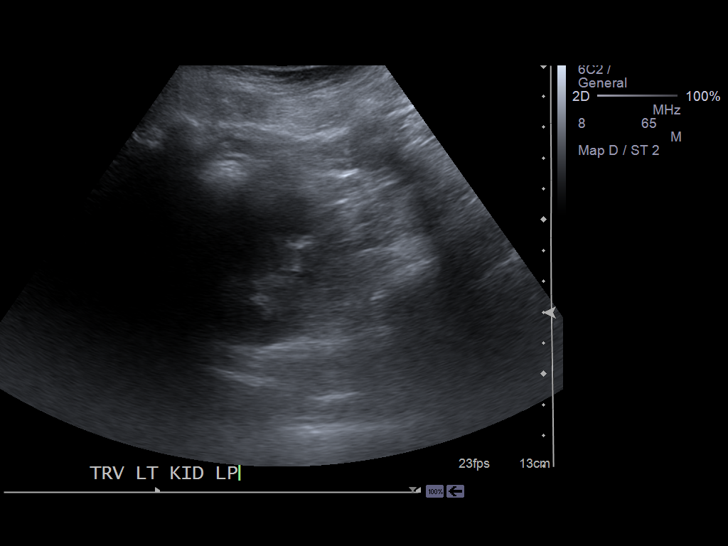
[im 35/39]
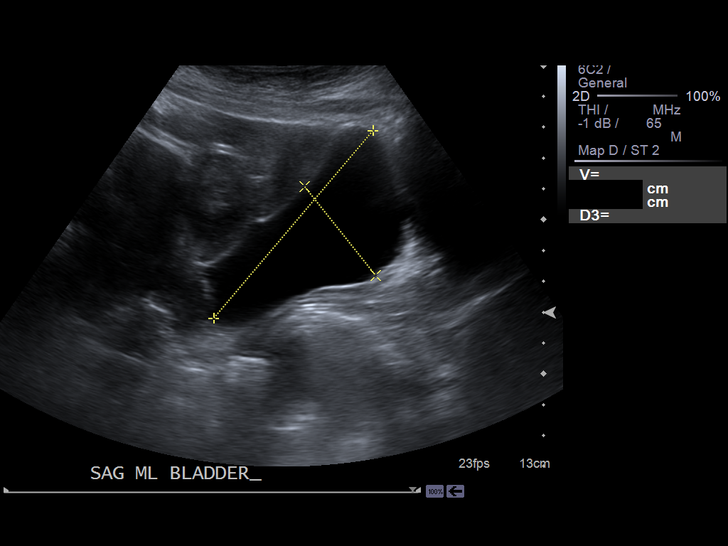
[im 39/39]
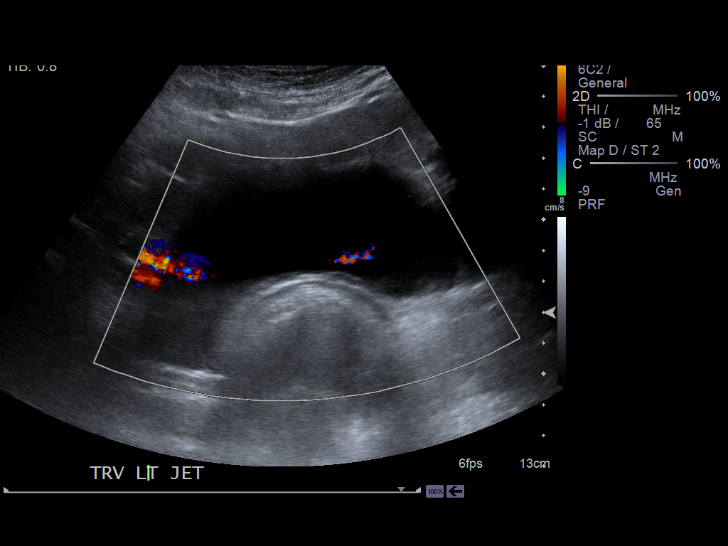

[14 of 25 positions shown; findings below may reference images not displayed]

FINDINGS: Right Kidney:  No hydronephrosis.  Renal length 9.3 cm.
Corticomedullary differentiation within normal limits.

Left Kidney:  No hydronephrosis.  Renal length 9.1 cm.
Corticomedullary differentiation within normal limits.

Bladder:  Pre void volume of 167 ml.  Bilateral ureteral jets
demonstrated.  No wall thickening or echogenic debris.
IMPRESSION: Normal study.

## 2012-08-22 DIAGNOSIS — E0590 Thyrotoxicosis, unspecified without thyrotoxic crisis or storm: Secondary | ICD-10-CM | POA: Diagnosis not present

## 2012-08-22 DIAGNOSIS — E559 Vitamin D deficiency, unspecified: Secondary | ICD-10-CM | POA: Diagnosis not present

## 2012-08-22 DIAGNOSIS — E785 Hyperlipidemia, unspecified: Secondary | ICD-10-CM | POA: Diagnosis not present

## 2012-08-22 DIAGNOSIS — G309 Alzheimer's disease, unspecified: Secondary | ICD-10-CM | POA: Diagnosis not present

## 2012-08-29 ENCOUNTER — Telehealth: Payer: Self-pay | Admitting: Family Medicine

## 2012-08-29 ENCOUNTER — Other Ambulatory Visit: Payer: Self-pay

## 2012-08-29 MED ORDER — DONEPEZIL HCL 5 MG PO TABS: 5.0000 mg | ORAL_TABLET | Freq: Every day | ORAL | Status: DC

## 2012-08-30 ENCOUNTER — Other Ambulatory Visit: Payer: Self-pay

## 2012-08-30 ENCOUNTER — Other Ambulatory Visit: Payer: Self-pay | Admitting: Family Medicine

## 2012-08-30 DIAGNOSIS — Z5189 Encounter for other specified aftercare: Secondary | ICD-10-CM | POA: Diagnosis not present

## 2012-08-30 DIAGNOSIS — R279 Unspecified lack of coordination: Secondary | ICD-10-CM | POA: Diagnosis not present

## 2012-08-30 DIAGNOSIS — I359 Nonrheumatic aortic valve disorder, unspecified: Secondary | ICD-10-CM | POA: Diagnosis not present

## 2012-08-30 DIAGNOSIS — F0390 Unspecified dementia without behavioral disturbance: Secondary | ICD-10-CM | POA: Diagnosis not present

## 2012-08-30 DIAGNOSIS — N289 Disorder of kidney and ureter, unspecified: Secondary | ICD-10-CM | POA: Diagnosis not present

## 2012-08-30 DIAGNOSIS — J449 Chronic obstructive pulmonary disease, unspecified: Secondary | ICD-10-CM | POA: Diagnosis not present

## 2012-08-30 DIAGNOSIS — I10 Essential (primary) hypertension: Secondary | ICD-10-CM | POA: Diagnosis not present

## 2012-08-30 DIAGNOSIS — F411 Generalized anxiety disorder: Secondary | ICD-10-CM | POA: Diagnosis not present

## 2012-08-30 DIAGNOSIS — M6281 Muscle weakness (generalized): Secondary | ICD-10-CM | POA: Diagnosis not present

## 2012-08-30 MED ORDER — DONEPEZIL HCL 5 MG PO TABS: 5.0000 mg | ORAL_TABLET | Freq: Every day | ORAL | Status: DC

## 2012-08-30 NOTE — Telephone Encounter (Signed)
Script entered.Pls fax and let pt know Also let her know I am happy that she is home, needs an appt first week in August , and needs to come in, I am looking forward to seeing her let her know

## 2012-09-03 DIAGNOSIS — F0390 Unspecified dementia without behavioral disturbance: Secondary | ICD-10-CM | POA: Diagnosis not present

## 2012-09-03 DIAGNOSIS — M6281 Muscle weakness (generalized): Secondary | ICD-10-CM | POA: Diagnosis not present

## 2012-09-03 DIAGNOSIS — R279 Unspecified lack of coordination: Secondary | ICD-10-CM | POA: Diagnosis not present

## 2012-09-03 DIAGNOSIS — I10 Essential (primary) hypertension: Secondary | ICD-10-CM | POA: Diagnosis not present

## 2012-09-03 DIAGNOSIS — J449 Chronic obstructive pulmonary disease, unspecified: Secondary | ICD-10-CM | POA: Diagnosis not present

## 2012-09-03 DIAGNOSIS — Z5189 Encounter for other specified aftercare: Secondary | ICD-10-CM | POA: Diagnosis not present

## 2012-09-04 ENCOUNTER — Encounter: Payer: Self-pay | Admitting: Family Medicine

## 2012-09-04 ENCOUNTER — Ambulatory Visit (INDEPENDENT_AMBULATORY_CARE_PROVIDER_SITE_OTHER): Payer: Medicare Other | Admitting: Family Medicine

## 2012-09-04 VITALS — BP 150/74 | HR 80 | Resp 16 | Ht 60.0 in | Wt 92.1 lb

## 2012-09-04 DIAGNOSIS — E785 Hyperlipidemia, unspecified: Secondary | ICD-10-CM | POA: Diagnosis not present

## 2012-09-04 DIAGNOSIS — E079 Disorder of thyroid, unspecified: Secondary | ICD-10-CM

## 2012-09-04 DIAGNOSIS — F329 Major depressive disorder, single episode, unspecified: Secondary | ICD-10-CM

## 2012-09-04 DIAGNOSIS — G4700 Insomnia, unspecified: Secondary | ICD-10-CM | POA: Diagnosis not present

## 2012-09-04 DIAGNOSIS — I10 Essential (primary) hypertension: Secondary | ICD-10-CM

## 2012-09-04 DIAGNOSIS — F0391 Unspecified dementia with behavioral disturbance: Secondary | ICD-10-CM

## 2012-09-04 MED ORDER — MIRTAZAPINE 7.5 MG PO TABS: 7.5000 mg | ORAL_TABLET | Freq: Every day | ORAL | Status: DC

## 2012-09-04 NOTE — Patient Instructions (Addendum)
F/u in mid to end  September, call if you need me before   New additional medication for depression and sleep and appetitie, call if you need me before    Please check in  On your [facility] family the first week in August  Enjoy your cat.  Ensure  You do your exercises  Continue aricept 5mg  dose until September, then dose will be increased to 10 mg   I am thankful that you have stopped smoking in the past 4 months, this is best for your physical health

## 2012-09-04 NOTE — Telephone Encounter (Signed)
Patient in for appt 7/16

## 2012-09-05 DIAGNOSIS — R279 Unspecified lack of coordination: Secondary | ICD-10-CM | POA: Diagnosis not present

## 2012-09-05 DIAGNOSIS — F0390 Unspecified dementia without behavioral disturbance: Secondary | ICD-10-CM | POA: Diagnosis not present

## 2012-09-05 DIAGNOSIS — Z5189 Encounter for other specified aftercare: Secondary | ICD-10-CM | POA: Diagnosis not present

## 2012-09-05 DIAGNOSIS — M6281 Muscle weakness (generalized): Secondary | ICD-10-CM | POA: Diagnosis not present

## 2012-09-05 DIAGNOSIS — J449 Chronic obstructive pulmonary disease, unspecified: Secondary | ICD-10-CM | POA: Diagnosis not present

## 2012-09-05 DIAGNOSIS — I10 Essential (primary) hypertension: Secondary | ICD-10-CM | POA: Diagnosis not present

## 2012-09-08 ENCOUNTER — Encounter: Payer: Self-pay | Admitting: Family Medicine

## 2012-09-08 ENCOUNTER — Telehealth: Payer: Self-pay | Admitting: Family Medicine

## 2012-09-08 DIAGNOSIS — E785 Hyperlipidemia, unspecified: Secondary | ICD-10-CM

## 2012-09-08 DIAGNOSIS — I10 Essential (primary) hypertension: Secondary | ICD-10-CM

## 2012-09-08 DIAGNOSIS — F0390 Unspecified dementia without behavioral disturbance: Secondary | ICD-10-CM | POA: Insufficient documentation

## 2012-09-08 NOTE — Assessment & Plan Note (Signed)
Pt easily agitated, was in skilled nursing facility post recent d/c where she "did well" was even ambulating without oxygen Tolerating current aricept dose will inc to treating dose at net visit

## 2012-09-08 NOTE — Telephone Encounter (Signed)
Pls contact pt and her siste obynne, she needs fasting lipid and chem 7 3 to 5 days before sept visit. On cholesterol med and has not been checked in nearly 2 years! Pls order the ;llabs and either mail or fax over , whichever preferred, thanks

## 2012-09-08 NOTE — Assessment & Plan Note (Addendum)
Poor sleep, poor appetite and depression, start remeron, no interest in therapy, gets agitated readily, states everyone is trating he "like a baby"

## 2012-09-08 NOTE — Assessment & Plan Note (Signed)
Followed by endo and needs to re establish

## 2012-09-08 NOTE — Progress Notes (Signed)
  Subjective:    Patient ID: [name], female    DOB: 03/19/1940, 72 y.o.   MRN: [id number]  HPI Pt i with her sister "[name]" for f/u. Hs in the past cancelled visits independently, stating she is wasting my time, family not happy with this and ask that they be reminded and kept up with her appts. Pt was hospitalized in June for 3 days with severe confusion, attributed in part to underlying dementia, was in rehab for approx 1 monht, and back home is already noted to be regressing per her sibling. Nicotine free for 4 monhts, but asks repeatedly "what if I want to smoke?" Appetite is poor, and sleep not goos med requested for this   Review of Systems See HPI Denies recent fever or chills. Denies sinus pressure, nasal congestion, ear pain or sore throat. Chronic shortness of breath, and cough and poor exercise tolerance Denies chest pains, palpitations and leg swelling Denies abdominal pain, nausea, vomiting,diarrhea or constipation.   Denies dysuria, frequency, hesitancy or incontinence. Denies joint pain,Denies headaches, seizures, numbness, or tingling.  Denies skin break down or rash.        Objective:   Physical Exam  Patient alert and oriented and in no cardiopulmonary distress at rest. Undernourished  HEENT: No facial asymmetry, EOMI, no sinus tenderness,  oropharynx pink and moist.  Neck decreased though adequate ROM, no adenopathy.  Chest: Clear to auscultation bilaterally.Decreased air entry throughout  CVS: S1, S2 systolic  murmur, no S3.  ABD: Soft non tender. Bowel sounds normal.  Ext: No edema  MS: Adequate ROM spine, shoulders, hips and knees.  Skin: Intact, no ulcerations or rash noted.  Psych: Good eye contact, Memory loss, both  anxious and depressed appearing.  CNS: CN 2-12 intact, power, tone and sensation normal throughout.       Assessment & Plan:

## 2012-09-08 NOTE — Assessment & Plan Note (Signed)
Controlled, no change in medication  

## 2012-09-08 NOTE — Assessment & Plan Note (Signed)
Updated lab needed for next visit, continue current meds

## 2012-09-09 NOTE — Telephone Encounter (Signed)
Patient aware and order mailed with instructions to do 1 week fasting before next visit

## 2012-09-09 NOTE — Addendum Note (Signed)
Addended by: Abner Greenspan on: 09/09/2012 08:18 AM   Modules accepted: Orders

## 2012-09-10 ENCOUNTER — Telehealth: Payer: Self-pay | Admitting: Family Medicine

## 2012-09-10 DIAGNOSIS — Z5189 Encounter for other specified aftercare: Secondary | ICD-10-CM | POA: Diagnosis not present

## 2012-09-10 DIAGNOSIS — J449 Chronic obstructive pulmonary disease, unspecified: Secondary | ICD-10-CM | POA: Diagnosis not present

## 2012-09-10 DIAGNOSIS — F0390 Unspecified dementia without behavioral disturbance: Secondary | ICD-10-CM | POA: Diagnosis not present

## 2012-09-10 DIAGNOSIS — M6281 Muscle weakness (generalized): Secondary | ICD-10-CM | POA: Diagnosis not present

## 2012-09-10 DIAGNOSIS — R279 Unspecified lack of coordination: Secondary | ICD-10-CM | POA: Diagnosis not present

## 2012-09-10 DIAGNOSIS — I10 Essential (primary) hypertension: Secondary | ICD-10-CM | POA: Diagnosis not present

## 2012-09-10 NOTE — Telephone Encounter (Signed)
Yes ok pls send in stamped d/c order on letterhead

## 2012-09-10 NOTE — Telephone Encounter (Signed)
Is this ok?

## 2012-09-11 DIAGNOSIS — I10 Essential (primary) hypertension: Secondary | ICD-10-CM | POA: Diagnosis not present

## 2012-09-11 DIAGNOSIS — F0390 Unspecified dementia without behavioral disturbance: Secondary | ICD-10-CM | POA: Diagnosis not present

## 2012-09-11 DIAGNOSIS — Z5189 Encounter for other specified aftercare: Secondary | ICD-10-CM | POA: Diagnosis not present

## 2012-09-11 DIAGNOSIS — M6281 Muscle weakness (generalized): Secondary | ICD-10-CM | POA: Diagnosis not present

## 2012-09-11 DIAGNOSIS — R279 Unspecified lack of coordination: Secondary | ICD-10-CM | POA: Diagnosis not present

## 2012-09-11 DIAGNOSIS — J449 Chronic obstructive pulmonary disease, unspecified: Secondary | ICD-10-CM

## 2012-09-12 DIAGNOSIS — Z5189 Encounter for other specified aftercare: Secondary | ICD-10-CM | POA: Diagnosis not present

## 2012-09-12 DIAGNOSIS — R279 Unspecified lack of coordination: Secondary | ICD-10-CM | POA: Diagnosis not present

## 2012-09-12 DIAGNOSIS — F0390 Unspecified dementia without behavioral disturbance: Secondary | ICD-10-CM | POA: Diagnosis not present

## 2012-09-12 DIAGNOSIS — I10 Essential (primary) hypertension: Secondary | ICD-10-CM | POA: Diagnosis not present

## 2012-09-12 DIAGNOSIS — M6281 Muscle weakness (generalized): Secondary | ICD-10-CM | POA: Diagnosis not present

## 2012-09-12 DIAGNOSIS — J449 Chronic obstructive pulmonary disease, unspecified: Secondary | ICD-10-CM | POA: Diagnosis not present

## 2012-09-12 NOTE — Telephone Encounter (Signed)
Discontinue order faxed

## 2012-09-17 DIAGNOSIS — M6281 Muscle weakness (generalized): Secondary | ICD-10-CM | POA: Diagnosis not present

## 2012-09-17 DIAGNOSIS — R279 Unspecified lack of coordination: Secondary | ICD-10-CM | POA: Diagnosis not present

## 2012-09-17 DIAGNOSIS — J449 Chronic obstructive pulmonary disease, unspecified: Secondary | ICD-10-CM | POA: Diagnosis not present

## 2012-09-17 DIAGNOSIS — Z5189 Encounter for other specified aftercare: Secondary | ICD-10-CM | POA: Diagnosis not present

## 2012-09-17 DIAGNOSIS — F0390 Unspecified dementia without behavioral disturbance: Secondary | ICD-10-CM | POA: Diagnosis not present

## 2012-09-17 DIAGNOSIS — I10 Essential (primary) hypertension: Secondary | ICD-10-CM | POA: Diagnosis not present

## 2012-09-19 DIAGNOSIS — M6281 Muscle weakness (generalized): Secondary | ICD-10-CM | POA: Diagnosis not present

## 2012-09-19 DIAGNOSIS — F0390 Unspecified dementia without behavioral disturbance: Secondary | ICD-10-CM | POA: Diagnosis not present

## 2012-09-19 DIAGNOSIS — Z5189 Encounter for other specified aftercare: Secondary | ICD-10-CM | POA: Diagnosis not present

## 2012-09-19 DIAGNOSIS — R279 Unspecified lack of coordination: Secondary | ICD-10-CM | POA: Diagnosis not present

## 2012-09-19 DIAGNOSIS — I10 Essential (primary) hypertension: Secondary | ICD-10-CM | POA: Diagnosis not present

## 2012-09-19 DIAGNOSIS — J449 Chronic obstructive pulmonary disease, unspecified: Secondary | ICD-10-CM | POA: Diagnosis not present

## 2012-09-24 DIAGNOSIS — R279 Unspecified lack of coordination: Secondary | ICD-10-CM | POA: Diagnosis not present

## 2012-09-24 DIAGNOSIS — I10 Essential (primary) hypertension: Secondary | ICD-10-CM | POA: Diagnosis not present

## 2012-09-24 DIAGNOSIS — J449 Chronic obstructive pulmonary disease, unspecified: Secondary | ICD-10-CM | POA: Diagnosis not present

## 2012-09-24 DIAGNOSIS — F0390 Unspecified dementia without behavioral disturbance: Secondary | ICD-10-CM | POA: Diagnosis not present

## 2012-09-24 DIAGNOSIS — M6281 Muscle weakness (generalized): Secondary | ICD-10-CM | POA: Diagnosis not present

## 2012-09-24 DIAGNOSIS — Z5189 Encounter for other specified aftercare: Secondary | ICD-10-CM | POA: Diagnosis not present

## 2012-09-26 DIAGNOSIS — M6281 Muscle weakness (generalized): Secondary | ICD-10-CM | POA: Diagnosis not present

## 2012-09-26 DIAGNOSIS — J449 Chronic obstructive pulmonary disease, unspecified: Secondary | ICD-10-CM | POA: Diagnosis not present

## 2012-09-26 DIAGNOSIS — I10 Essential (primary) hypertension: Secondary | ICD-10-CM | POA: Diagnosis not present

## 2012-09-26 DIAGNOSIS — Z5189 Encounter for other specified aftercare: Secondary | ICD-10-CM | POA: Diagnosis not present

## 2012-09-26 DIAGNOSIS — R279 Unspecified lack of coordination: Secondary | ICD-10-CM | POA: Diagnosis not present

## 2012-09-26 DIAGNOSIS — F0390 Unspecified dementia without behavioral disturbance: Secondary | ICD-10-CM | POA: Diagnosis not present

## 2012-10-02 DIAGNOSIS — I10 Essential (primary) hypertension: Secondary | ICD-10-CM | POA: Diagnosis not present

## 2012-10-02 DIAGNOSIS — M6281 Muscle weakness (generalized): Secondary | ICD-10-CM | POA: Diagnosis not present

## 2012-10-02 DIAGNOSIS — J449 Chronic obstructive pulmonary disease, unspecified: Secondary | ICD-10-CM | POA: Diagnosis not present

## 2012-10-02 DIAGNOSIS — R279 Unspecified lack of coordination: Secondary | ICD-10-CM | POA: Diagnosis not present

## 2012-10-02 DIAGNOSIS — F0390 Unspecified dementia without behavioral disturbance: Secondary | ICD-10-CM | POA: Diagnosis not present

## 2012-10-02 DIAGNOSIS — Z5189 Encounter for other specified aftercare: Secondary | ICD-10-CM | POA: Diagnosis not present

## 2012-10-03 DIAGNOSIS — R279 Unspecified lack of coordination: Secondary | ICD-10-CM | POA: Diagnosis not present

## 2012-10-03 DIAGNOSIS — Z5189 Encounter for other specified aftercare: Secondary | ICD-10-CM | POA: Diagnosis not present

## 2012-10-03 DIAGNOSIS — J449 Chronic obstructive pulmonary disease, unspecified: Secondary | ICD-10-CM | POA: Diagnosis not present

## 2012-10-03 DIAGNOSIS — I10 Essential (primary) hypertension: Secondary | ICD-10-CM | POA: Diagnosis not present

## 2012-10-03 DIAGNOSIS — F0390 Unspecified dementia without behavioral disturbance: Secondary | ICD-10-CM | POA: Diagnosis not present

## 2012-10-03 DIAGNOSIS — M6281 Muscle weakness (generalized): Secondary | ICD-10-CM | POA: Diagnosis not present

## 2012-10-09 DIAGNOSIS — J449 Chronic obstructive pulmonary disease, unspecified: Secondary | ICD-10-CM | POA: Diagnosis not present

## 2012-10-09 DIAGNOSIS — M6281 Muscle weakness (generalized): Secondary | ICD-10-CM | POA: Diagnosis not present

## 2012-10-09 DIAGNOSIS — R279 Unspecified lack of coordination: Secondary | ICD-10-CM | POA: Diagnosis not present

## 2012-10-09 DIAGNOSIS — I10 Essential (primary) hypertension: Secondary | ICD-10-CM | POA: Diagnosis not present

## 2012-10-09 DIAGNOSIS — Z5189 Encounter for other specified aftercare: Secondary | ICD-10-CM | POA: Diagnosis not present

## 2012-10-09 DIAGNOSIS — F0390 Unspecified dementia without behavioral disturbance: Secondary | ICD-10-CM | POA: Diagnosis not present

## 2012-10-10 DIAGNOSIS — M6281 Muscle weakness (generalized): Secondary | ICD-10-CM | POA: Diagnosis not present

## 2012-10-10 DIAGNOSIS — R279 Unspecified lack of coordination: Secondary | ICD-10-CM | POA: Diagnosis not present

## 2012-10-10 DIAGNOSIS — F0390 Unspecified dementia without behavioral disturbance: Secondary | ICD-10-CM | POA: Diagnosis not present

## 2012-10-10 DIAGNOSIS — I10 Essential (primary) hypertension: Secondary | ICD-10-CM | POA: Diagnosis not present

## 2012-10-10 DIAGNOSIS — J449 Chronic obstructive pulmonary disease, unspecified: Secondary | ICD-10-CM | POA: Diagnosis not present

## 2012-10-10 DIAGNOSIS — Z5189 Encounter for other specified aftercare: Secondary | ICD-10-CM | POA: Diagnosis not present

## 2012-10-22 ENCOUNTER — Other Ambulatory Visit: Payer: Self-pay

## 2012-10-22 ENCOUNTER — Other Ambulatory Visit: Payer: Self-pay | Admitting: Family Medicine

## 2012-10-22 MED ORDER — CHOLINE FENOFIBRATE 135 MG PO CPDR: 135.0000 mg | DELAYED_RELEASE_CAPSULE | Freq: Every day | ORAL | Status: DC

## 2012-11-12 ENCOUNTER — Ambulatory Visit (INDEPENDENT_AMBULATORY_CARE_PROVIDER_SITE_OTHER): Payer: Medicare Other | Admitting: Family Medicine

## 2012-11-12 ENCOUNTER — Encounter: Payer: Self-pay | Admitting: Family Medicine

## 2012-11-12 VITALS — BP 148/80 | HR 63 | Resp 16 | Wt 96.1 lb

## 2012-11-12 DIAGNOSIS — Z23 Encounter for immunization: Secondary | ICD-10-CM | POA: Diagnosis not present

## 2012-11-12 DIAGNOSIS — F329 Major depressive disorder, single episode, unspecified: Secondary | ICD-10-CM | POA: Diagnosis not present

## 2012-11-12 DIAGNOSIS — F03918 Unspecified dementia, unspecified severity, with other behavioral disturbance: Secondary | ICD-10-CM

## 2012-11-12 DIAGNOSIS — E0590 Thyrotoxicosis, unspecified without thyrotoxic crisis or storm: Secondary | ICD-10-CM

## 2012-11-12 DIAGNOSIS — I10 Essential (primary) hypertension: Secondary | ICD-10-CM

## 2012-11-12 DIAGNOSIS — F0391 Unspecified dementia with behavioral disturbance: Secondary | ICD-10-CM

## 2012-11-12 DIAGNOSIS — E785 Hyperlipidemia, unspecified: Secondary | ICD-10-CM | POA: Diagnosis not present

## 2012-11-12 DIAGNOSIS — F3289 Other specified depressive episodes: Secondary | ICD-10-CM

## 2012-11-12 MED ORDER — DONEPEZIL HCL 10 MG PO TABS: 10.0000 mg | ORAL_TABLET | Freq: Every day | ORAL | Status: DC

## 2012-11-12 NOTE — Patient Instructions (Addendum)
F/u in 2 month, call if you need me before  Flu vaccine today  We will schedule appt for f/u with Dr Fransico Him   iNCREASE ARICEPT TP STD TREATING DOSE OF 10MG  DAILY  eNSURE YOU TAKE REMERON 7.5MG  TOTAL DOSE EVERY NIGHT  wE WILL ASK LINCARE TO CHECK TO SEE IF YOU NEED OXYGEN WHILE SLEEPING

## 2012-11-12 NOTE — Progress Notes (Signed)
  Subjective:    Patient ID: [name], female    DOB: [birth year], 72 y.o.   MRN: [id number]  HPI The PT is here for follow up and re-evaluation of chronic medical conditions, medication management and review of any available recent lab and radiology data.  Preventive health is updated, specifically  Cancer screening and Immunization.   The PT denies any adverse reactions to current medications since the last visit. Doing well with both aricept and remeron, though she may unintentionally be under dosing the remeron. States she feels better and her appetite and energy have improved There are no new concerns.  There are no specific complaints, has cut back a lot on nicotine, trying  To quit       Review of Systems See HPI Denies recent fever or chills. Denies sinus pressure, nasal congestion, ear pain or sore throat. Denies chest congestion, productive cough or wheezing. Denies chest pains, palpitations and leg swelling Denies abdominal pain, nausea, vomiting,diarrhea or constipation.   Denies dysuria, frequency, hesitancy or incontinence. Denies joint pain, swelling and limitation in mobility. Denies headaches, seizures, numbness, or tingling. Denies uncontrolled  depression, anxiety or insomnia. Denies skin break down or rash.        Objective:   Physical Exam Patient alert and oriented and in no cardiopulmonary distress.  HEENT: No facial asymmetry, EOMI, no sinus tenderness,  oropharynx pink and moist.  Neck supple no adenopathy.  Chest: Clear to auscultation bilaterally.Decrerased air entry throughout  CVS: S1, S2 no murmurs, no S3.  ABD: Soft non tender. Bowel sounds normal.  Ext: No edema  MS: Adequate ROM spine, shoulders, hips and knees.  Skin: Intact, no ulcerations or rash noted.  Psych: Good eye contact, normal affect. Memory impaired not anxious or depressed appearing.  CNS: CN 2-12 intact, power, tone and sensation normal throughout.         Assessment & Plan:

## 2012-11-17 NOTE — Assessment & Plan Note (Signed)
Controlled, no change in medication  

## 2012-11-17 NOTE — Assessment & Plan Note (Signed)
Improved, pt to continue remeron, ensure she takes full dose prescribed

## 2012-11-17 NOTE — Assessment & Plan Note (Signed)
Improved, tolerating aricept , inc to std treating dose

## 2012-11-17 NOTE — Assessment & Plan Note (Signed)
Needs to re establish with endo, will refer

## 2012-11-17 NOTE — Assessment & Plan Note (Signed)
Updated lab needed Will request at next visit

## 2012-11-18 ENCOUNTER — Telehealth: Payer: Self-pay

## 2012-11-18 NOTE — Telephone Encounter (Signed)
Yes pls print I will sign, she get 15 tans per month of the 15 mg tab

## 2012-11-19 MED ORDER — MIRTAZAPINE 15 MG PO TABS: 7.5000 mg | ORAL_TABLET | Freq: Every day | ORAL | Status: DC

## 2012-11-19 NOTE — Telephone Encounter (Signed)
New dose sent with note to dc 7.5mg  tab

## 2012-11-26 ENCOUNTER — Other Ambulatory Visit: Payer: Self-pay | Admitting: Family Medicine

## 2012-11-26 ENCOUNTER — Other Ambulatory Visit (HOSPITAL_COMMUNITY): Payer: Self-pay | Admitting: Specialist

## 2012-11-26 DIAGNOSIS — E0590 Thyrotoxicosis, unspecified without thyrotoxic crisis or storm: Secondary | ICD-10-CM | POA: Diagnosis not present

## 2012-11-26 DIAGNOSIS — E049 Nontoxic goiter, unspecified: Secondary | ICD-10-CM

## 2012-11-28 ENCOUNTER — Ambulatory Visit (HOSPITAL_COMMUNITY)
Admission: RE | Admit: 2012-11-28 | Discharge: 2012-11-28 | Disposition: A | Discharge status: Home / Self Care | Payer: Medicare Other | Source: Ambulatory Visit | Attending: Specialist | Admitting: Specialist

## 2012-11-28 DIAGNOSIS — E042 Nontoxic multinodular goiter: Secondary | ICD-10-CM | POA: Diagnosis not present

## 2012-11-28 DIAGNOSIS — E049 Nontoxic goiter, unspecified: Secondary | ICD-10-CM

## 2012-11-28 DIAGNOSIS — E0590 Thyrotoxicosis, unspecified without thyrotoxic crisis or storm: Secondary | ICD-10-CM | POA: Diagnosis not present

## 2012-11-29 ENCOUNTER — Other Ambulatory Visit: Payer: Self-pay | Admitting: Family Medicine

## 2012-12-03 DIAGNOSIS — E0590 Thyrotoxicosis, unspecified without thyrotoxic crisis or storm: Secondary | ICD-10-CM | POA: Diagnosis not present

## 2012-12-03 DIAGNOSIS — E042 Nontoxic multinodular goiter: Secondary | ICD-10-CM | POA: Diagnosis not present

## 2012-12-10 ENCOUNTER — Other Ambulatory Visit: Payer: Self-pay | Admitting: Family Medicine

## 2013-01-14 ENCOUNTER — Ambulatory Visit: Payer: Medicare Other | Admitting: Family Medicine

## 2013-01-31 ENCOUNTER — Other Ambulatory Visit: Payer: Self-pay

## 2013-01-31 DIAGNOSIS — E0590 Thyrotoxicosis, unspecified without thyrotoxic crisis or storm: Secondary | ICD-10-CM | POA: Diagnosis not present

## 2013-01-31 DIAGNOSIS — E042 Nontoxic multinodular goiter: Secondary | ICD-10-CM | POA: Diagnosis not present

## 2013-01-31 MED ORDER — MIRTAZAPINE 15 MG PO TABS: 7.5000 mg | ORAL_TABLET | Freq: Every day | ORAL | Status: DC

## 2013-02-04 ENCOUNTER — Other Ambulatory Visit: Payer: Self-pay

## 2013-02-04 MED ORDER — MIRTAZAPINE 7.5 MG PO TABS: 7.5000 mg | ORAL_TABLET | Freq: Every day | ORAL | Status: DC

## 2013-02-12 ENCOUNTER — Other Ambulatory Visit: Payer: Self-pay | Admitting: Family Medicine

## 2013-02-17 ENCOUNTER — Other Ambulatory Visit: Payer: Self-pay

## 2013-02-17 MED ORDER — FENOFIBRIC ACID 135 MG PO CPDR: DELAYED_RELEASE_CAPSULE | ORAL | Status: DC

## 2013-02-24 ENCOUNTER — Other Ambulatory Visit: Payer: Self-pay

## 2013-02-24 DIAGNOSIS — F03918 Unspecified dementia, unspecified severity, with other behavioral disturbance: Secondary | ICD-10-CM

## 2013-02-24 DIAGNOSIS — F0391 Unspecified dementia with behavioral disturbance: Secondary | ICD-10-CM

## 2013-02-24 MED ORDER — DONEPEZIL HCL 10 MG PO TABS: 10.0000 mg | ORAL_TABLET | Freq: Every day | ORAL | Status: DC

## 2013-02-25 ENCOUNTER — Other Ambulatory Visit: Payer: Self-pay | Admitting: Family Medicine

## 2013-05-12 ENCOUNTER — Other Ambulatory Visit: Payer: Self-pay | Admitting: Family Medicine

## 2013-05-19 ENCOUNTER — Other Ambulatory Visit: Payer: Self-pay | Admitting: Family Medicine

## 2013-05-21 ENCOUNTER — Other Ambulatory Visit: Payer: Self-pay | Admitting: Family Medicine

## 2013-06-13 ENCOUNTER — Other Ambulatory Visit: Payer: Self-pay | Admitting: Family Medicine

## 2013-06-17 ENCOUNTER — Other Ambulatory Visit: Payer: Self-pay | Admitting: Family Medicine

## 2013-06-18 ENCOUNTER — Telehealth: Payer: Self-pay | Admitting: Family Medicine

## 2013-06-18 ENCOUNTER — Other Ambulatory Visit: Payer: Self-pay | Admitting: Family Medicine

## 2013-06-18 NOTE — Telephone Encounter (Signed)
Refill sent for 30 day supply only. Needs appt before anymore refills

## 2013-06-26 ENCOUNTER — Telehealth: Payer: Self-pay | Admitting: *Deleted

## 2013-06-26 DIAGNOSIS — M899 Disorder of bone, unspecified: Secondary | ICD-10-CM

## 2013-06-26 DIAGNOSIS — R7301 Impaired fasting glucose: Secondary | ICD-10-CM

## 2013-06-26 DIAGNOSIS — R5383 Other fatigue: Secondary | ICD-10-CM

## 2013-06-26 DIAGNOSIS — M949 Disorder of cartilage, unspecified: Secondary | ICD-10-CM

## 2013-06-26 DIAGNOSIS — R5381 Other malaise: Secondary | ICD-10-CM

## 2013-06-26 DIAGNOSIS — I10 Essential (primary) hypertension: Secondary | ICD-10-CM

## 2013-06-26 DIAGNOSIS — E785 Hyperlipidemia, unspecified: Secondary | ICD-10-CM

## 2013-06-26 NOTE — Telephone Encounter (Signed)
See in comment

## 2013-06-27 NOTE — Telephone Encounter (Signed)
Number listed in chart was wrong number. Unable to reach patient on home number Mailed lab order

## 2013-06-27 NOTE — Addendum Note (Signed)
Addended by: Eual Fines on: 06/27/2013 11:29 AM   Modules accepted: Orders

## 2013-06-27 NOTE — Telephone Encounter (Signed)
Lipid, cmp vit d cbc fasting needed

## 2013-07-17 DIAGNOSIS — R5383 Other fatigue: Secondary | ICD-10-CM | POA: Diagnosis not present

## 2013-07-17 DIAGNOSIS — I10 Essential (primary) hypertension: Secondary | ICD-10-CM | POA: Diagnosis not present

## 2013-07-17 DIAGNOSIS — R5381 Other malaise: Secondary | ICD-10-CM | POA: Diagnosis not present

## 2013-07-17 DIAGNOSIS — M899 Disorder of bone, unspecified: Secondary | ICD-10-CM | POA: Diagnosis not present

## 2013-07-17 DIAGNOSIS — E785 Hyperlipidemia, unspecified: Secondary | ICD-10-CM | POA: Diagnosis not present

## 2013-07-17 LAB — CBC WITH DIFFERENTIAL/PLATELET
BASOS PCT: 1 % (ref 0–1)
Basophils Absolute: 0.1 10*3/uL (ref 0.0–0.1)
Eosinophils Absolute: 0.3 10*3/uL (ref 0.0–0.7)
Eosinophils Relative: 4 % (ref 0–5)
HEMATOCRIT: 37.5 % (ref 36.0–46.0)
Hemoglobin: 12.5 g/dL (ref 12.0–15.0)
LYMPHS ABS: 1.8 10*3/uL (ref 0.7–4.0)
Lymphocytes Relative: 27 % (ref 12–46)
MCH: 28.7 pg (ref 26.0–34.0)
MCHC: 33.3 g/dL (ref 30.0–36.0)
MCV: 86 fL (ref 78.0–100.0)
MONO ABS: 0.5 10*3/uL (ref 0.1–1.0)
MONOS PCT: 7 % (ref 3–12)
NEUTROS ABS: 4.1 10*3/uL (ref 1.7–7.7)
NEUTROS PCT: 61 % (ref 43–77)
Platelets: 405 10*3/uL — ABNORMAL HIGH (ref 150–400)
RBC: 4.36 MIL/uL (ref 3.87–5.11)
RDW: 14.2 % (ref 11.5–15.5)
WBC: 6.7 10*3/uL (ref 4.0–10.5)

## 2013-07-17 LAB — LIPID PANEL
Cholesterol: 211 mg/dL — ABNORMAL HIGH (ref 0–200)
HDL: 72 mg/dL (ref >39)
LDL Cholesterol: 121 mg/dL — ABNORMAL HIGH (ref 0–99)
Total CHOL/HDL Ratio: 2.9 Ratio
Triglycerides: 90 mg/dL (ref <150)
VLDL: 18 mg/dL (ref 0–40)

## 2013-07-17 LAB — COMPREHENSIVE METABOLIC PANEL
ALBUMIN: 4.3 g/dL (ref 3.5–5.2)
ALK PHOS: 57 U/L (ref 39–117)
ALT: 14 U/L (ref 0–35)
AST: 25 U/L (ref 0–37)
BUN: 14 mg/dL (ref 6–23)
CO2: 30 meq/L (ref 19–32)
Calcium: 9.9 mg/dL (ref 8.4–10.5)
Chloride: 102 mEq/L (ref 96–112)
Creat: 1.05 mg/dL (ref 0.50–1.10)
GLUCOSE: 99 mg/dL (ref 70–99)
POTASSIUM: 4.6 meq/L (ref 3.5–5.3)
SODIUM: 140 meq/L (ref 135–145)
TOTAL PROTEIN: 6.8 g/dL (ref 6.0–8.3)
Total Bilirubin: 0.5 mg/dL (ref 0.2–1.2)

## 2013-07-18 LAB — VITAMIN D 25 HYDROXY (VIT D DEFICIENCY, FRACTURES): Vit D, 25-Hydroxy: 40 ng/mL (ref 30–89)

## 2013-07-20 ENCOUNTER — Other Ambulatory Visit: Payer: Self-pay | Admitting: Family Medicine

## 2013-07-22 ENCOUNTER — Encounter: Payer: Self-pay | Admitting: Family Medicine

## 2013-07-22 ENCOUNTER — Ambulatory Visit (INDEPENDENT_AMBULATORY_CARE_PROVIDER_SITE_OTHER): Payer: Medicare Other | Admitting: Family Medicine

## 2013-07-22 ENCOUNTER — Other Ambulatory Visit: Payer: Self-pay | Admitting: Family Medicine

## 2013-07-22 ENCOUNTER — Encounter (INDEPENDENT_AMBULATORY_CARE_PROVIDER_SITE_OTHER): Payer: Self-pay

## 2013-07-22 VITALS — BP 104/66 | HR 82 | Resp 18 | Wt 107.0 lb

## 2013-07-22 DIAGNOSIS — I10 Essential (primary) hypertension: Secondary | ICD-10-CM | POA: Diagnosis not present

## 2013-07-22 DIAGNOSIS — W19XXXA Unspecified fall, initial encounter: Secondary | ICD-10-CM

## 2013-07-22 DIAGNOSIS — E785 Hyperlipidemia, unspecified: Secondary | ICD-10-CM | POA: Diagnosis not present

## 2013-07-22 DIAGNOSIS — I509 Heart failure, unspecified: Secondary | ICD-10-CM

## 2013-07-22 DIAGNOSIS — F03918 Unspecified dementia, unspecified severity, with other behavioral disturbance: Secondary | ICD-10-CM

## 2013-07-22 DIAGNOSIS — J449 Chronic obstructive pulmonary disease, unspecified: Secondary | ICD-10-CM | POA: Diagnosis not present

## 2013-07-22 DIAGNOSIS — F0391 Unspecified dementia with behavioral disturbance: Secondary | ICD-10-CM

## 2013-07-22 DIAGNOSIS — Y92009 Unspecified place in unspecified non-institutional (private) residence as the place of occurrence of the external cause: Secondary | ICD-10-CM

## 2013-07-22 MED ORDER — IPRATROPIUM-ALBUTEROL 20-100 MCG/ACT IN AERS: 1.0000 | INHALATION_SPRAY | Freq: Four times a day (QID) | RESPIRATORY_TRACT | Status: DC

## 2013-07-22 NOTE — Patient Instructions (Addendum)
Annual wellness in  October, call if you need me before  Blood work is very good  Please start word searches to stimulate the brain   Home safety is important  You are referred for xray of  Left shoulder, right shoulder, left forearm and elbow and you are referred to Dr Aline Brochure for further evaluation   Fall Prevention and Home Safety Falls cause injuries and can affect all age groups. It is possible to prevent falls.  HOW TO PREVENT FALLS  Wear shoes with rubber soles that do not have an opening for your toes.  Keep the inside and outside of your house well lit.  Use night lights throughout your home.  Remove clutter from floors.  Clean up floor spills.  Remove throw rugs or fasten them to the floor with carpet tape.  Do not place electrical cords across pathways.  Put grab bars by your tub, shower, and toilet. Do not use towel bars as grab bars.  Put handrails on both sides of the stairway. Fix loose handrails.  Do not climb on stools or stepladders, if possible.  Do not wax your floors.  Repair uneven or unsafe sidewalks, walkways, or stairs.  Keep items you use a lot within reach.  Be aware of pets.  Keep emergency numbers next to the telephone.  Put smoke detectors in your home and near bedrooms. Ask your doctor what other things you can do to prevent falls. Document Released: 12/03/2008 Document Revised: 08/08/2011 Document Reviewed: 05/09/2011 [facility] Patient Information 2014 [place], [place].

## 2013-07-23 ENCOUNTER — Other Ambulatory Visit: Payer: Self-pay | Admitting: Family Medicine

## 2013-07-24 ENCOUNTER — Telehealth: Payer: Self-pay | Admitting: Family Medicine

## 2013-07-24 NOTE — Telephone Encounter (Signed)
Since Combivent and Spiriva are very similar, Dr said she cannot be on both at the same time due to heart risk. Patient filled combivent 07/22/2013. Pharmacy to notify patient

## 2013-07-29 ENCOUNTER — Emergency Department (HOSPITAL_COMMUNITY): Payer: Medicare Other

## 2013-07-29 ENCOUNTER — Encounter (HOSPITAL_COMMUNITY): Payer: Self-pay | Admitting: Emergency Medicine

## 2013-07-29 ENCOUNTER — Emergency Department (HOSPITAL_COMMUNITY)
Admission: EM | Admit: 2013-07-29 | Discharge: 2013-07-29 | Disposition: A | Discharge status: Home / Self Care | Payer: Medicare Other | Attending: Emergency Medicine | Admitting: Emergency Medicine

## 2013-07-29 DIAGNOSIS — Z954 Presence of other heart-valve replacement: Secondary | ICD-10-CM | POA: Diagnosis not present

## 2013-07-29 DIAGNOSIS — Z880 Allergy status to penicillin: Secondary | ICD-10-CM | POA: Insufficient documentation

## 2013-07-29 DIAGNOSIS — S59909A Unspecified injury of unspecified elbow, initial encounter: Secondary | ICD-10-CM | POA: Diagnosis not present

## 2013-07-29 DIAGNOSIS — M25529 Pain in unspecified elbow: Secondary | ICD-10-CM | POA: Diagnosis not present

## 2013-07-29 DIAGNOSIS — Y929 Unspecified place or not applicable: Secondary | ICD-10-CM | POA: Insufficient documentation

## 2013-07-29 DIAGNOSIS — S4980XA Other specified injuries of shoulder and upper arm, unspecified arm, initial encounter: Secondary | ICD-10-CM | POA: Diagnosis not present

## 2013-07-29 DIAGNOSIS — S46909A Unspecified injury of unspecified muscle, fascia and tendon at shoulder and upper arm level, unspecified arm, initial encounter: Secondary | ICD-10-CM | POA: Insufficient documentation

## 2013-07-29 DIAGNOSIS — R0602 Shortness of breath: Secondary | ICD-10-CM | POA: Diagnosis not present

## 2013-07-29 DIAGNOSIS — W19XXXA Unspecified fall, initial encounter: Secondary | ICD-10-CM

## 2013-07-29 DIAGNOSIS — Z872 Personal history of diseases of the skin and subcutaneous tissue: Secondary | ICD-10-CM | POA: Insufficient documentation

## 2013-07-29 DIAGNOSIS — F411 Generalized anxiety disorder: Secondary | ICD-10-CM | POA: Insufficient documentation

## 2013-07-29 DIAGNOSIS — M25519 Pain in unspecified shoulder: Secondary | ICD-10-CM | POA: Diagnosis not present

## 2013-07-29 DIAGNOSIS — M810 Age-related osteoporosis without current pathological fracture: Secondary | ICD-10-CM | POA: Insufficient documentation

## 2013-07-29 DIAGNOSIS — M25539 Pain in unspecified wrist: Secondary | ICD-10-CM | POA: Diagnosis not present

## 2013-07-29 DIAGNOSIS — Z87891 Personal history of nicotine dependence: Secondary | ICD-10-CM | POA: Diagnosis not present

## 2013-07-29 DIAGNOSIS — Z8619 Personal history of other infectious and parasitic diseases: Secondary | ICD-10-CM | POA: Insufficient documentation

## 2013-07-29 DIAGNOSIS — I359 Nonrheumatic aortic valve disorder, unspecified: Secondary | ICD-10-CM | POA: Insufficient documentation

## 2013-07-29 DIAGNOSIS — Z79899 Other long term (current) drug therapy: Secondary | ICD-10-CM | POA: Diagnosis not present

## 2013-07-29 DIAGNOSIS — Z7982 Long term (current) use of aspirin: Secondary | ICD-10-CM | POA: Diagnosis not present

## 2013-07-29 DIAGNOSIS — I10 Essential (primary) hypertension: Secondary | ICD-10-CM | POA: Insufficient documentation

## 2013-07-29 DIAGNOSIS — R296 Repeated falls: Secondary | ICD-10-CM | POA: Insufficient documentation

## 2013-07-29 DIAGNOSIS — R079 Chest pain, unspecified: Secondary | ICD-10-CM | POA: Diagnosis not present

## 2013-07-29 DIAGNOSIS — S6990XA Unspecified injury of unspecified wrist, hand and finger(s), initial encounter: Secondary | ICD-10-CM | POA: Diagnosis not present

## 2013-07-29 DIAGNOSIS — J441 Chronic obstructive pulmonary disease with (acute) exacerbation: Secondary | ICD-10-CM | POA: Diagnosis not present

## 2013-07-29 DIAGNOSIS — Y9389 Activity, other specified: Secondary | ICD-10-CM | POA: Insufficient documentation

## 2013-07-29 HISTORY — DX: Unspecified dementia, unspecified severity, without behavioral disturbance, psychotic disturbance, mood disturbance, and anxiety: F03.90

## 2013-07-29 LAB — CBC WITH DIFFERENTIAL/PLATELET
BASOS PCT: 0 % (ref 0–1)
Basophils Absolute: 0 10*3/uL (ref 0.0–0.1)
Eosinophils Absolute: 0 10*3/uL (ref 0.0–0.7)
Eosinophils Relative: 0 % (ref 0–5)
HEMATOCRIT: 39.4 % (ref 36.0–46.0)
HEMOGLOBIN: 12.6 g/dL (ref 12.0–15.0)
LYMPHS ABS: 1 10*3/uL (ref 0.7–4.0)
LYMPHS PCT: 11 % — AB (ref 12–46)
MCH: 29.5 pg (ref 26.0–34.0)
MCHC: 32 g/dL (ref 30.0–36.0)
MCV: 92.3 fL (ref 78.0–100.0)
MONO ABS: 0.1 10*3/uL (ref 0.1–1.0)
Monocytes Relative: 1 % — ABNORMAL LOW (ref 3–12)
NEUTROS ABS: 8.4 10*3/uL — AB (ref 1.7–7.7)
Neutrophils Relative %: 88 % — ABNORMAL HIGH (ref 43–77)
Platelets: 369 10*3/uL (ref 150–400)
RBC: 4.27 MIL/uL (ref 3.87–5.11)
RDW: 13.5 % (ref 11.5–15.5)
WBC: 9.6 10*3/uL (ref 4.0–10.5)

## 2013-07-29 LAB — COMPREHENSIVE METABOLIC PANEL
ALT: 15 U/L (ref 0–35)
AST: 30 U/L (ref 0–37)
Albumin: 3.5 g/dL (ref 3.5–5.2)
Alkaline Phosphatase: 60 U/L (ref 39–117)
BILIRUBIN TOTAL: 0.3 mg/dL (ref 0.3–1.2)
BUN: 18 mg/dL (ref 6–23)
CHLORIDE: 99 meq/L (ref 96–112)
CO2: 30 meq/L (ref 19–32)
Calcium: 10.4 mg/dL (ref 8.4–10.5)
Creatinine, Ser: 0.85 mg/dL (ref 0.50–1.10)
GFR calc Af Amer: 78 mL/min — ABNORMAL LOW (ref >90)
GFR, EST NON AFRICAN AMERICAN: 67 mL/min — AB (ref >90)
Glucose, Bld: 109 mg/dL — ABNORMAL HIGH (ref 70–99)
Potassium: 4.8 mEq/L (ref 3.7–5.3)
Sodium: 144 mEq/L (ref 137–147)
Total Protein: 7.1 g/dL (ref 6.0–8.3)

## 2013-07-29 LAB — PRO B NATRIURETIC PEPTIDE: PRO B NATRI PEPTIDE: 352.5 pg/mL — AB (ref 0–125)

## 2013-07-29 LAB — URINALYSIS, ROUTINE W REFLEX MICROSCOPIC
BILIRUBIN URINE: NEGATIVE
Glucose, UA: NEGATIVE mg/dL
Hgb urine dipstick: NEGATIVE
KETONES UR: NEGATIVE mg/dL
NITRITE: NEGATIVE
PROTEIN: NEGATIVE mg/dL
Specific Gravity, Urine: 1.015 (ref 1.005–1.030)
Urobilinogen, UA: 0.2 mg/dL (ref 0.0–1.0)
pH: 5.5 (ref 5.0–8.0)

## 2013-07-29 LAB — URINE MICROSCOPIC-ADD ON

## 2013-07-29 LAB — I-STAT CG4 LACTIC ACID, ED: Lactic Acid, Venous: 0.79 mmol/L (ref 0.5–2.2)

## 2013-07-29 MED ORDER — ALBUTEROL SULFATE (2.5 MG/3ML) 0.083% IN NEBU
2.5000 mg | INHALATION_SOLUTION | Freq: Once | RESPIRATORY_TRACT | Status: AC
  Administered 2013-07-29: 2.5 mg via RESPIRATORY_TRACT
  Filled 2013-07-29: qty 3

## 2013-07-29 MED ORDER — IPRATROPIUM-ALBUTEROL 0.5-2.5 (3) MG/3ML IN SOLN
3.0000 mL | Freq: Once | RESPIRATORY_TRACT | Status: AC
  Administered 2013-07-29: 3 mL via RESPIRATORY_TRACT
  Filled 2013-07-29: qty 3

## 2013-07-29 MED ORDER — PREDNISONE 20 MG PO TABS: 40.0000 mg | ORAL_TABLET | Freq: Every day | ORAL | Status: AC

## 2013-07-29 NOTE — ED Provider Notes (Signed)
CSN: [id number]     Arrival date & time 07/29/13  1403 History  This chart was scribed for Carmin Muskrat, MD by Roe Coombs, ED Scribe. The patient was seen in room APA18/APA18. Patient's care was started at 2:21 PM.    Chief Complaint  Patient presents with  . Shortness of Breath  . Chest Pain    The history is provided by the patient. No language interpreter was used.    HPI Comments: [name] is a 73 y.o. female with history of hypertension, COPD, hyperlipidemia who presents to the Emergency Department complaining of intermittent left anterior chest pain for the past 5-6 days. The pain does not radiate. Patient also reports shortness of breath and wheezing for the past 1 weeks that improved with 1 Duo Neb treatment and Solumedrol 125 mg en route to the ED via EMS. Per family, has not been feeling well for the past month. Patient has not been eating full meals and has been experiencing generalized weakness for several weeks. Patient further reports that she had a fall about two weeks ago and is complaining of left shoulder pain since the fall. She denies confusion, disorientation, weight loss, weight gain, leg swelling. Patient denies abdominal pain, nausea, vomiting, diarrhea, fever, or chills.   Past Medical History  Diagnosis Date  . Rheumatic fever   . Renal insufficiency   . Aortic stenosis   . Hyperlipidemia   . Hypertension   . COPD with chronic bronchitis   . Osteoporosis   . Sebaceous cyst   . Urosepsis 2012    required several hospitalizations for this  . Thyroid disease 2012    hyperthyroid, had abl;ation therapy treated by endo  . Anxiety    Past Surgical History  Procedure Laterality Date  . 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 Appendectomy    . Cholecystectomy    . Lumbar laminectomy    . Tonsillectomy    . Colon surgery  2005  . Breast lumpectomy    . Esophagogastroduodenoscopy  10/12/09    short segment Barrett's esophage/small hiatal  hernia,antral ersions  . Colonoscopy  07/23/2008     Single diminutive polyp in the rectum status post cold biopsy removal/ Status post right hemicolectomy/ Colonic ulcers in the upstream end the colon distal to the  anastomosis as described above.  Status post biopsy. The anastomosis itself and neoterminal ileum appeared normal   Family History  Problem Relation Age of Onset  . Heart attack Mother 25  . Stroke Father 33  . Hypertension Sister     2 sisters   History  Substance Use Topics  . Smoking status: Former Smoker -- 0.50 packs/day for 50 years    Types: Cigarettes    Quit date: 04/30/2012  . Smokeless tobacco: Not on file     Comment: sometimes every other day   . Alcohol Use: No   OB History   Grav Para Term Preterm Abortions TAB SAB Ect Mult Living                 Review of Systems  Constitutional:       Per HPI, otherwise negative  HENT:       Per HPI, otherwise negative  Respiratory:       Per HPI, otherwise negative  Cardiovascular:       Per HPI, otherwise negative  Gastrointestinal: Negative for vomiting.  Endocrine:       Negative aside from  HPI  Genitourinary:       Neg aside from HPI   Musculoskeletal:       Per HPI, otherwise negative  Skin: Negative.   Neurological: Negative for syncope.      Allergies  Nitrofurantoin; Bupropion hcl; Cyclobenzaprine hcl; Gadolinium; Levofloxacin; Lorazepam; Penicillins; Statins; and Sulfonamide derivatives  Home Medications   Prior to Admission medications   Medication Sig Start Date End Date Taking? Authorizing Provider  albuterol (PROVENTIL HFA) 108 (90 BASE) MCG/ACT inhaler Inhale 2 puffs into the lungs every 6 (six) hours as needed. Shortness of breath    Historical Provider, MD  aspirin (ADULT ASPIRIN EC LOW STRENGTH) 81 MG EC tablet Take 81 mg by mouth every morning.     Historical Provider, MD  Calcium Carbonate-Vit D-Min (QC CALCIUM/MINERALS/VITAMIN D) 600-400 MG-UNIT TABS Take 1 tablet by mouth  every evening.     Historical Provider, MD  Choline Fenofibrate (FENOFIBRIC ACID) 135 MG CPDR TAKE 1 CAPSULE (135 MG TOTAL) BY MOUTH AT BEDTIME. 02/25/13   Fayrene Helper, MD  donepezil (ARICEPT) 10 MG tablet TAKE 1 TABLET (10 MG TOTAL) BY MOUTH AT BEDTIME. 05/19/13   Fayrene Helper, MD  fluticasone [facility]) 50 MCG/ACT nasal spray Place 2 sprays into the nose daily as needed. Sinus/congestion    Historical Provider, MD  Fluticasone-Salmeterol (ADVAIR) 250-50 MCG/DOSE AEPB Inhale 1 puff into the lungs 2 (two) times daily.    Historical Provider, MD  Ipratropium-Albuterol (COMBIVENT) 20-100 MCG/ACT AERS respimat Inhale 1 puff into the lungs every 6 (six) hours. 07/22/13   Fayrene Helper, MD  lansoprazole (PREVACID) 15 MG capsule Take 15 mg by mouth daily.    Historical Provider, MD  loratadine (CLARITIN) 10 MG tablet Take 10 mg by mouth every morning.     Historical Provider, MD  losartan (COZAAR) 25 MG tablet Take 12.5 mg by mouth daily. If blood pressure levels are normal Patient does not take medication    Historical Provider, MD  methimazole (TAPAZOLE) 5 MG tablet Take 1 tablet (5 mg total) by mouth every morning. 05/28/12   Fayrene Helper, MD  mirtazapine (REMERON) 7.5 MG tablet TAKE 1 TABLET BY MOUTH AT BEDTIME 05/12/13   Fayrene Helper, MD  Omega-3 Fatty Acids (FISH OIL CONCENTRATE) 1000 MG CAPS Take 1 capsule by mouth 2 (two) times daily.     Historical Provider, MD  propranolol (INDERAL) 20 MG tablet TAKE 1 TABLET (20 MG TOTAL) BY MOUTH 2 (TWO) TIMES DAILY. 11/29/12   Fayrene Helper, MD  propranolol (INDERAL) 20 MG tablet TAKE 1 TABLET (20 MG TOTAL) BY MOUTH 2 (TWO) TIMES DAILY.    Fayrene Helper, MD  SPIRIVA HANDIHALER 18 MCG inhalation capsule INHALE 1 CAPSULE WITH HANDIHALER AT THE SAME TIME EVERY DAY    Fayrene Helper, MD  SPIRIVA HANDIHALER 18 MCG inhalation capsule INHALE 1 CAPSULE WITH HANDIHALER AT THE SAME TIME EVERY DAY 07/22/13   Fayrene Helper, MD   vitamin C (ASCORBIC ACID) 500 MG tablet Take 500 mg by mouth daily.    Historical Provider, MD   Triage Vitals: BP 133/59  Pulse 90  Temp(Src) 97.8 F (36.6 C) (Oral)  Resp 24  Ht 5\' 4"  (1.626 m)  Wt 109 lb (49.442 kg)  BMI 18.70 kg/m2  SpO2 100% Physical Exam  Nursing note and vitals reviewed. Constitutional: She is oriented to person, place, and time. She appears well-developed and well-nourished. No distress.  HENT:  Head: Normocephalic and atraumatic.  Eyes:  Conjunctivae and EOM are normal.  Cardiovascular: Normal rate and regular rhythm.   Pulmonary/Chest: No stridor. No respiratory distress.  Clear shallow inspiration.   Abdominal: She exhibits no distension.  Musculoskeletal: She exhibits no edema.  Patient complains of pain in the left arm following a fall that occurred at least 2 weeks ago. Patient can not specify where the pain is, but seems to indicate pain in the wrist, elbow, shoulder. Patient does move the wrist freely, moves all freely, moves the shoulder freely  Neurological: She is alert and oriented to person, place, and time. No cranial nerve deficit.  Skin: Skin is warm and dry.  Psychiatric: Her speech is delayed. Cognition and memory are impaired.    ED Course  Procedures (including critical care time)    COORDINATION OF CARE: 2:28 PM- Patient informed of current plan for treatment and evaluation and agrees with plan at this time.     Labs Review Labs Reviewed  CBC WITH DIFFERENTIAL - Abnormal; Notable for the following:    Neutrophils Relative % 88 (*)    Neutro Abs 8.4 (*)    Lymphocytes Relative 11 (*)    Monocytes Relative 1 (*)    All other components within normal limits  COMPREHENSIVE METABOLIC PANEL - Abnormal; Notable for the following:    Glucose, Bld 109 (*)    GFR calc non Af Amer 67 (*)    GFR calc Af Amer 78 (*)    All other components within normal limits  PRO B NATRIURETIC PEPTIDE - Abnormal; Notable for the following:    Pro  B Natriuretic peptide (BNP) 352.5 (*)    All other components within normal limits  URINALYSIS, ROUTINE W REFLEX MICROSCOPIC - Abnormal; Notable for the following:    Leukocytes, UA TRACE (*)    All other components within normal limits  URINE MICROSCOPIC-ADD ON  I-STAT CG4 LACTIC ACID, ED    Imaging Review Dg Chest 2 View  07/29/2013   CLINICAL DATA:  Shortness of breath  EXAM: CHEST  2 VIEW  COMPARISON:  07/28/2012  FINDINGS: Cardiomediastinal silhouette is stable. Status post median sternotomy. Atherosclerotic calcifications of thoracic aorta again noted. Mild hyperinflation. Osteopenia and mild degenerative changes thoracic spine. No acute infiltrate or pulmonary edema.  IMPRESSION: No active disease. Mild hyperinflation. Status post median sternotomy.   Electronically Signed   By: Lahoma Crocker M.D.   On: 07/29/2013 16:22   Dg Elbow Complete Left  07/29/2013   CLINICAL DATA:  Injury, pain  EXAM: LEFT ELBOW - COMPLETE 3+ VIEW  COMPARISON:  None.  FINDINGS: Bones are osteopenic. Normal alignment without fracture or effusion. No definite focal soft tissue abnormality.  IMPRESSION: No acute osseous finding   Electronically Signed   By: Daryll Brod M.D.   On: 07/29/2013 17:43   Dg Wrist Complete Left  07/29/2013   CLINICAL DATA:  Injury, pain  EXAM: LEFT WRIST - COMPLETE 3+ VIEW  COMPARISON:  None.  FINDINGS: Bones are osteopenic. Normal alignment without acute fracture. Distal radius, ulna and carpal bones appear intact. Chondrocalcinosis noted of the joint space. Peripheral IV noted dorsally.  IMPRESSION: Osteopenia.  No acute osseous finding   Electronically Signed   By: Daryll Brod M.D.   On: 07/29/2013 17:44   Dg Shoulder Left  07/29/2013   CLINICAL DATA:  Fall, left shoulder pain  EXAM: LEFT SHOULDER - 2+ VIEW  COMPARISON:  None.  FINDINGS: Bones are osteopenic. Minor AC joint degenerative change. Normal alignment without acute  fracture. No subluxation or dislocation.  IMPRESSION: No acute  osseous finding.   Electronically Signed   By: Daryll Brod M.D.   On: 07/29/2013 17:42     EKG Interpretation   Date/Time:  Tuesday July 29 2013 14:12:39 EDT Ventricular Rate:  87 PR Interval:  150 QRS Duration: 71 QT Interval:  357 QTC Calculation: 429 R Axis:   57 Text Interpretation:  Sinus rhythm Probable left atrial enlargement  Artifact in lead(s) I II aVR aVL aVF Sinus rhythm Artifact Abnormal ekg  Confirmed by Carmin Muskrat  MD ([id number]) on 07/29/2013 2:19:02 PM      6:10 PM Patient in no distress.  I discussed all findings with patient and her daughter. Patient will follow up with her primary care physician.      MDM   Patient with history of dementia, COPD presents with ongoing dyspnea.  Patient improved substantially here following EMS provision of albuterol, initiation of steroids. Patient's evaluation does not suggest pneumonia or other concurrent infection. Patient was discharged in stable condition to follow up with primary care.     I personally performed the services described in this documentation, which was scribed in my presence. The recorded information has been reviewed and is accurate.      Carmin Muskrat, MD 07/29/13 [phone]

## 2013-07-29 NOTE — ED Notes (Signed)
Dr Lockwood at bedside,  

## 2013-07-29 NOTE — ED Notes (Signed)
Breathing tx started. No change in status.

## 2013-07-29 NOTE — Discharge Instructions (Signed)
As discussed, it is important that you follow up as soon as possible with your physician for continued management of your condition.  Please be sure to use your albuterol every 4 hours for the next days in addition to be provided steroids.  If you develop any new, or concerning changes in your condition, please return to the emergency department immediately.   Chronic Obstructive Pulmonary Disease Chronic obstructive pulmonary disease (COPD) is a common lung condition in which airflow from the lungs is limited. COPD is a general term that can be used to describe many different lung problems that limit airflow, including both chronic bronchitis and emphysema. If you have COPD, your lung function will probably never return to normal, but there are measures you can take to improve lung function and make yourself feel better.  CAUSES   Smoking (common).   Exposure to secondhand smoke.   Genetic problems.  Chronic inflammatory lung diseases or recurrent infections. SYMPTOMS   Shortness of breath, especially with physical activity.   Deep, persistent (chronic) cough with a large amount of thick mucus.   Wheezing.   Rapid breaths (tachypnea).   Gray or bluish discoloration (cyanosis) of the skin, especially in fingers, toes, or lips.   Fatigue.   Weight loss.   Frequent infections or episodes when breathing symptoms become much worse (exacerbations).   Chest tightness. DIAGNOSIS  Your healthcare provider will take a medical history and perform a physical examination to make the initial diagnosis. Additional tests for COPD may include:   Lung (pulmonary) function tests.  Chest X-ray.  CT scan.  Blood tests. TREATMENT  Treatment available to help you feel better when you have COPD include:   Inhaler and nebulizer medicines. These help manage the symptoms of COPD and make your breathing more comfortable  Supplemental oxygen. Supplemental oxygen is only helpful if  you have a low oxygen level in your blood.   Exercise and physical activity. These are beneficial for nearly all people with COPD. Some people may also benefit from a pulmonary rehabilitation program. HOME CARE INSTRUCTIONS   Take all medicines (inhaled or pills) as directed by your health care provider.  Only take over-the-counter or prescription medicines for pain, fever, or discomfort as directed by your health care provider.   Avoid over-the-counter medicines or cough syrups that dry up your airway (such as antihistamines) and slow down the elimination of secretions unless instructed otherwise by your healthcare provider.   If you are a smoker, the most important thing that you can do is stop smoking. Continuing to smoke will cause further lung damage and breathing trouble. Ask your health care provider for help with quitting smoking. He or she can direct you to community resources or hospitals that provide support.  Avoid exposure to irritants such as smoke, chemicals, and fumes that aggravate your breathing.  Use oxygen therapy and pulmonary rehabilitation if directed by your health care provider. If you require home oxygen therapy, ask your healthcare provider whether you should purchase a pulse oximeter to measure your oxygen level at home.   Avoid contact with individuals who have a contagious illness.  Avoid extreme temperature and humidity changes.  Eat healthy foods. Eating smaller, more frequent meals and resting before meals may help you maintain your strength.  Stay active, but balance activity with periods of rest. Exercise and physical activity will help you maintain your ability to do things you want to do.  Preventing infection and hospitalization is very important when you have  COPD. Make sure to receive all the vaccines your health care provider recommends, especially the pneumococcal and influenza vaccines. Ask your healthcare provider whether you need a pneumonia  vaccine.  Learn and use relaxation techniques to manage stress.  Learn and use controlled breathing techniques as directed by your health care provider. Controlled breathing techniques include:   Pursed lip breathing. Start by breathing in (inhaling) through your nose for 1 second. Then, purse your lips as if you were going to whistle and breathe out (exhale) through the pursed lips for 2 seconds.   Diaphragmatic breathing. Start by putting one hand on your abdomen just above your waist. Inhale slowly through your nose. The hand on your abdomen should move out. Then purse your lips and exhale slowly. You should be able to feel the hand on your abdomen moving in as you exhale.   Learn and use controlled coughing to clear mucus from your lungs. Controlled coughing is a series of short, progressive coughs. The steps of controlled coughing are:  1. Lean your head slightly forward.  2. Breathe in deeply using diaphragmatic breathing.  3. Try to hold your breath for 3 seconds.  4. Keep your mouth slightly open while coughing twice.  5. Spit any mucus out into a tissue.  6. Rest and repeat the steps once or twice as needed. SEEK MEDICAL CARE IF:   You are coughing up more mucus than usual.   There is a change in the color or thickness of your mucus.   Your breathing is more labored than usual.   Your breathing is faster than usual.  SEEK IMMEDIATE MEDICAL CARE IF:   You have shortness of breath while you are resting.   You have shortness of breath that prevents you from:  Being able to talk.   Performing your usual physical activities.   You have chest pain lasting longer than 5 minutes.   Your skin color is more cyanotic than usual.  You measure low oxygen saturations for longer than 5 minutes with a pulse oximeter. MAKE SURE YOU:   Understand these instructions.  Will watch your condition.  Will get help right away if you are not doing well or get  worse. Document Released: 11/16/2004 Document Revised: 11/27/2012 Document Reviewed: 10/03/2012 [facility] Patient Information 2014 [place], [place].

## 2013-07-29 NOTE — ED Notes (Signed)
Pt c/o sob, wheezing that has become worse over the past week, pt reports that she has not felt good for the past month, has been weak, not eating or drinking fluids well. Pt arrived to er via EMS, had been given one duo neb and 125mg  on solumedrol while enroute to er with improvement in symptoms, on arrival to er pt able to answer questions, denies any pain, is still receiving duoneb started by ems,

## 2013-07-31 ENCOUNTER — Other Ambulatory Visit: Payer: Self-pay | Admitting: Family Medicine

## 2013-08-21 ENCOUNTER — Ambulatory Visit: Payer: Medicare Other | Admitting: Family Medicine

## 2013-08-28 ENCOUNTER — Ambulatory Visit (INDEPENDENT_AMBULATORY_CARE_PROVIDER_SITE_OTHER): Payer: Medicare Other | Admitting: Orthopedic Surgery

## 2013-08-28 ENCOUNTER — Encounter: Payer: Self-pay | Admitting: Orthopedic Surgery

## 2013-08-28 VITALS — BP 117/77 | Ht 64.0 in | Wt 109.0 lb

## 2013-08-28 DIAGNOSIS — M7552 Bursitis of left shoulder: Secondary | ICD-10-CM

## 2013-08-28 DIAGNOSIS — M67919 Unspecified disorder of synovium and tendon, unspecified shoulder: Secondary | ICD-10-CM | POA: Diagnosis not present

## 2013-08-28 DIAGNOSIS — M7550 Bursitis of unspecified shoulder: Secondary | ICD-10-CM | POA: Insufficient documentation

## 2013-08-28 DIAGNOSIS — M719 Bursopathy, unspecified: Secondary | ICD-10-CM

## 2013-08-28 NOTE — Patient Instructions (Signed)
You have received a steroid shot. 15% of patients experience increased pain at the injection site with in the next 24 hours. This is best treated with ice and tylenol extra strength 2 tabs every 8 hours. If you are still having pain please call the office.   Shoulder contusion

## 2013-08-28 NOTE — Progress Notes (Signed)
Subjective:     Patient ID: [name], female   DOB: 04-02-1940, 73 y.o.   MRN: [id number]  Shoulder Pain    chief complaint is left shoulder pain  Dr. Moshe Cipro referral  History about 3-5 months ago 73 year-old female fell in a door came down on her left shoulder. Since that time she said pain catching locking stiffness with intensity of 6/10 over the left shoulder. Deltoid region. No treatment. She did have x-rays that were negative for fracture   Review of Systems Recent weight loss active dental issues bleeding problems shortness of breath breathing difficulty oxygen requirement nausea vomiting without bowel habit change she wears glasses depression anxiety otherwise normal    Objective:   Physical Exam BP 117/77  Ht 5\' 4"  (1.626 m)  Wt 109 lb (49.442 kg)  BMI 18.70 kg/m2  General appearance: Development, nutrition are normal. Body habitus small No gross deformities are noted and grooming normal.  Peripheral vascular system no swelling minor varicose veins are noted and pulses are palpable without tenderness, temperature warm to touch no edema.  No palpable lymph nodes are noted in the cervical area or axillae.  The skin overlying the right and left shoulder cervical and thoracic spine is normal without rash, lesion or ulceration  Deep tendon reflexes are normal and equal. And pathologic reflexes such as Hoffman sign are negative.  Sensation remains normal.  The patient is oriented to person place and time, the mood and affect are normal  Ambulation remains normal  Cervical spine no mass or tenderness. Range of motion is normal. Muscle tone is normal. Skin is normal.  Right shoulder inspection reveals no tenderness or malalignment. There is no crepitation. The range of motion remains full flexion internal and external rotation. Stability tests are normal in abduction external rotation inferior subluxation test as well as the posterior stress test. Manual muscle  testing of the supraspinatus, internal and external rotators 5 over 5  Impingement sign is normal, Hawkins maneuver normal, a.c. joint stress test normal.  Left shoulder  There is tenderness around the anterior deltoid and the posterior aspect of the acromion, internal rotation is limited, forward elevation is limited. External rotation is normal. The patient is stable in abduction external rotation. There is mild weakness of the supraspinatus tendon 4/5 with normal internal and external rotation strength are 5/5. The impingement sign is positive The a.c. joint stress test is normal A Hawkins maneuver is normal      Assessment:     CLINICAL DATA: Fall, left shoulder pain  EXAM:  LEFT SHOULDER - 2+ VIEW  COMPARISON: None.  FINDINGS:  Bones are osteopenic. Minor AC joint degenerative change. Normal  alignment without acute fracture. No subluxation or dislocation.  IMPRESSION:  No acute osseous finding.  Electronically Signed  By: Daryll Brod M.D.  On: 07/29/2013 17:42    Posttraumatic bursitis left shoulder    Plan:     Subacromial injection

## 2013-08-29 ENCOUNTER — Other Ambulatory Visit: Payer: Self-pay | Admitting: Family Medicine

## 2013-09-01 ENCOUNTER — Other Ambulatory Visit: Payer: Self-pay | Admitting: Family Medicine

## 2013-09-08 ENCOUNTER — Encounter: Payer: Self-pay | Admitting: Family Medicine

## 2013-10-28 ENCOUNTER — Inpatient Hospital Stay (HOSPITAL_COMMUNITY)
Admission: EM | Admit: 2013-10-28 | Discharge: 2013-11-01 | DRG: 640 | Disposition: A | Discharge status: Skilled Nursing Facility | Payer: Medicare Other | Attending: Internal Medicine | Admitting: Internal Medicine

## 2013-10-28 ENCOUNTER — Emergency Department (HOSPITAL_COMMUNITY): Payer: Medicare Other

## 2013-10-28 ENCOUNTER — Encounter (HOSPITAL_COMMUNITY): Payer: Self-pay | Admitting: Emergency Medicine

## 2013-10-28 DIAGNOSIS — R0602 Shortness of breath: Secondary | ICD-10-CM | POA: Diagnosis not present

## 2013-10-28 DIAGNOSIS — F329 Major depressive disorder, single episode, unspecified: Secondary | ICD-10-CM | POA: Diagnosis not present

## 2013-10-28 DIAGNOSIS — Z952 Presence of prosthetic heart valve: Secondary | ICD-10-CM

## 2013-10-28 DIAGNOSIS — J449 Chronic obstructive pulmonary disease, unspecified: Secondary | ICD-10-CM | POA: Diagnosis present

## 2013-10-28 DIAGNOSIS — R5381 Other malaise: Secondary | ICD-10-CM | POA: Diagnosis not present

## 2013-10-28 DIAGNOSIS — J441 Chronic obstructive pulmonary disease with (acute) exacerbation: Secondary | ICD-10-CM

## 2013-10-28 DIAGNOSIS — E860 Dehydration: Secondary | ICD-10-CM | POA: Diagnosis not present

## 2013-10-28 DIAGNOSIS — L723 Sebaceous cyst: Secondary | ICD-10-CM | POA: Diagnosis not present

## 2013-10-28 DIAGNOSIS — I509 Heart failure, unspecified: Secondary | ICD-10-CM | POA: Diagnosis present

## 2013-10-28 DIAGNOSIS — Z8249 Family history of ischemic heart disease and other diseases of the circulatory system: Secondary | ICD-10-CM | POA: Diagnosis not present

## 2013-10-28 DIAGNOSIS — Z91199 Patient's noncompliance with other medical treatment and regimen due to unspecified reason: Secondary | ICD-10-CM | POA: Diagnosis not present

## 2013-10-28 DIAGNOSIS — E0590 Thyrotoxicosis, unspecified without thyrotoxic crisis or storm: Secondary | ICD-10-CM | POA: Diagnosis not present

## 2013-10-28 DIAGNOSIS — R110 Nausea: Secondary | ICD-10-CM | POA: Diagnosis present

## 2013-10-28 DIAGNOSIS — R269 Unspecified abnormalities of gait and mobility: Secondary | ICD-10-CM | POA: Diagnosis not present

## 2013-10-28 DIAGNOSIS — R488 Other symbolic dysfunctions: Secondary | ICD-10-CM | POA: Diagnosis not present

## 2013-10-28 DIAGNOSIS — M818 Other osteoporosis without current pathological fracture: Secondary | ICD-10-CM | POA: Diagnosis not present

## 2013-10-28 DIAGNOSIS — I517 Cardiomegaly: Secondary | ICD-10-CM | POA: Diagnosis not present

## 2013-10-28 DIAGNOSIS — E872 Acidosis, unspecified: Secondary | ICD-10-CM | POA: Diagnosis present

## 2013-10-28 DIAGNOSIS — F0390 Unspecified dementia without behavioral disturbance: Secondary | ICD-10-CM | POA: Diagnosis not present

## 2013-10-28 DIAGNOSIS — J4489 Other specified chronic obstructive pulmonary disease: Secondary | ICD-10-CM | POA: Diagnosis present

## 2013-10-28 DIAGNOSIS — Z87891 Personal history of nicotine dependence: Secondary | ICD-10-CM

## 2013-10-28 DIAGNOSIS — R4182 Altered mental status, unspecified: Secondary | ICD-10-CM | POA: Diagnosis not present

## 2013-10-28 DIAGNOSIS — Z9049 Acquired absence of other specified parts of digestive tract: Secondary | ICD-10-CM

## 2013-10-28 DIAGNOSIS — I10 Essential (primary) hypertension: Secondary | ICD-10-CM | POA: Diagnosis present

## 2013-10-28 DIAGNOSIS — I359 Nonrheumatic aortic valve disorder, unspecified: Secondary | ICD-10-CM | POA: Diagnosis present

## 2013-10-28 DIAGNOSIS — Z9981 Dependence on supplemental oxygen: Secondary | ICD-10-CM | POA: Diagnosis not present

## 2013-10-28 DIAGNOSIS — R627 Adult failure to thrive: Secondary | ICD-10-CM | POA: Diagnosis present

## 2013-10-28 DIAGNOSIS — E079 Disorder of thyroid, unspecified: Secondary | ICD-10-CM | POA: Diagnosis not present

## 2013-10-28 DIAGNOSIS — F03918 Unspecified dementia, unspecified severity, with other behavioral disturbance: Secondary | ICD-10-CM

## 2013-10-28 DIAGNOSIS — F411 Generalized anxiety disorder: Secondary | ICD-10-CM | POA: Diagnosis not present

## 2013-10-28 DIAGNOSIS — J9610 Chronic respiratory failure, unspecified whether with hypoxia or hypercapnia: Secondary | ICD-10-CM | POA: Diagnosis present

## 2013-10-28 DIAGNOSIS — Z823 Family history of stroke: Secondary | ICD-10-CM | POA: Diagnosis not present

## 2013-10-28 DIAGNOSIS — F3289 Other specified depressive episodes: Secondary | ICD-10-CM | POA: Diagnosis not present

## 2013-10-28 DIAGNOSIS — J984 Other disorders of lung: Secondary | ICD-10-CM | POA: Diagnosis not present

## 2013-10-28 DIAGNOSIS — N289 Disorder of kidney and ureter, unspecified: Secondary | ICD-10-CM | POA: Diagnosis not present

## 2013-10-28 DIAGNOSIS — W19XXXA Unspecified fall, initial encounter: Secondary | ICD-10-CM | POA: Insufficient documentation

## 2013-10-28 DIAGNOSIS — E785 Hyperlipidemia, unspecified: Secondary | ICD-10-CM | POA: Diagnosis present

## 2013-10-28 DIAGNOSIS — I639 Cerebral infarction, unspecified: Secondary | ICD-10-CM | POA: Diagnosis present

## 2013-10-28 DIAGNOSIS — F0391 Unspecified dementia with behavioral disturbance: Secondary | ICD-10-CM

## 2013-10-28 DIAGNOSIS — I00 Rheumatic fever without heart involvement: Secondary | ICD-10-CM | POA: Diagnosis not present

## 2013-10-28 DIAGNOSIS — J42 Unspecified chronic bronchitis: Secondary | ICD-10-CM | POA: Diagnosis not present

## 2013-10-28 DIAGNOSIS — M810 Age-related osteoporosis without current pathological fracture: Secondary | ICD-10-CM | POA: Diagnosis present

## 2013-10-28 DIAGNOSIS — M6281 Muscle weakness (generalized): Secondary | ICD-10-CM | POA: Diagnosis not present

## 2013-10-28 DIAGNOSIS — E43 Unspecified severe protein-calorie malnutrition: Secondary | ICD-10-CM | POA: Diagnosis present

## 2013-10-28 DIAGNOSIS — R5383 Other fatigue: Secondary | ICD-10-CM | POA: Diagnosis present

## 2013-10-28 DIAGNOSIS — J9819 Other pulmonary collapse: Secondary | ICD-10-CM | POA: Diagnosis not present

## 2013-10-28 DIAGNOSIS — F17200 Nicotine dependence, unspecified, uncomplicated: Secondary | ICD-10-CM | POA: Diagnosis not present

## 2013-10-28 DIAGNOSIS — R531 Weakness: Secondary | ICD-10-CM | POA: Diagnosis present

## 2013-10-28 DIAGNOSIS — I6350 Cerebral infarction due to unspecified occlusion or stenosis of unspecified cerebral artery: Secondary | ICD-10-CM | POA: Diagnosis not present

## 2013-10-28 DIAGNOSIS — Z9119 Patient's noncompliance with other medical treatment and regimen: Secondary | ICD-10-CM | POA: Diagnosis not present

## 2013-10-28 DIAGNOSIS — Y92009 Unspecified place in unspecified non-institutional (private) residence as the place of occurrence of the external cause: Principal | ICD-10-CM | POA: Insufficient documentation

## 2013-10-28 LAB — CBC WITH DIFFERENTIAL/PLATELET
BASOS PCT: 1 % (ref 0–1)
Basophils Absolute: 0 10*3/uL (ref 0.0–0.1)
Eosinophils Absolute: 0 10*3/uL (ref 0.0–0.7)
Eosinophils Relative: 1 % (ref 0–5)
HEMATOCRIT: 41.9 % (ref 36.0–46.0)
HEMOGLOBIN: 13.5 g/dL (ref 12.0–15.0)
LYMPHS ABS: 1.5 10*3/uL (ref 0.7–4.0)
Lymphocytes Relative: 25 % (ref 12–46)
MCH: 29.9 pg (ref 26.0–34.0)
MCHC: 32.2 g/dL (ref 30.0–36.0)
MCV: 92.9 fL (ref 78.0–100.0)
MONO ABS: 0.6 10*3/uL (ref 0.1–1.0)
Monocytes Relative: 10 % (ref 3–12)
Neutro Abs: 3.8 10*3/uL (ref 1.7–7.7)
Neutrophils Relative %: 63 % (ref 43–77)
Platelets: 419 10*3/uL — ABNORMAL HIGH (ref 150–400)
RBC: 4.51 MIL/uL (ref 3.87–5.11)
RDW: 14.8 % (ref 11.5–15.5)
WBC: 6 10*3/uL (ref 4.0–10.5)

## 2013-10-28 LAB — URINALYSIS, ROUTINE W REFLEX MICROSCOPIC
Glucose, UA: NEGATIVE mg/dL
HGB URINE DIPSTICK: NEGATIVE
Ketones, ur: 15 mg/dL — AB
Leukocytes, UA: NEGATIVE
Nitrite: NEGATIVE
PH: 6 (ref 5.0–8.0)
Protein, ur: NEGATIVE mg/dL
Specific Gravity, Urine: 1.02 (ref 1.005–1.030)
UROBILINOGEN UA: 2 mg/dL — AB (ref 0.0–1.0)

## 2013-10-28 LAB — BASIC METABOLIC PANEL
Anion gap: 15 (ref 5–15)
BUN: 19 mg/dL (ref 6–23)
CHLORIDE: 95 meq/L — AB (ref 96–112)
CO2: 33 mEq/L — ABNORMAL HIGH (ref 19–32)
CREATININE: 0.62 mg/dL (ref 0.50–1.10)
Calcium: 10 mg/dL (ref 8.4–10.5)
GFR calc non Af Amer: 89 mL/min — ABNORMAL LOW (ref >90)
GLUCOSE: 143 mg/dL — AB (ref 70–99)
Potassium: 3.3 mEq/L — ABNORMAL LOW (ref 3.7–5.3)
Sodium: 143 mEq/L (ref 137–147)

## 2013-10-28 LAB — TROPONIN I

## 2013-10-28 LAB — HEPATIC FUNCTION PANEL
ALT: 22 U/L (ref 0–35)
AST: 36 U/L (ref 0–37)
Albumin: 3.2 g/dL — ABNORMAL LOW (ref 3.5–5.2)
Alkaline Phosphatase: 68 U/L (ref 39–117)
BILIRUBIN TOTAL: 0.5 mg/dL (ref 0.3–1.2)
Bilirubin, Direct: <0.2 mg/dL (ref 0.0–0.3)
Total Protein: 6.8 g/dL (ref 6.0–8.3)

## 2013-10-28 LAB — PHOSPHORUS: Phosphorus: 3.5 mg/dL (ref 2.3–4.6)

## 2013-10-28 LAB — LIPASE, BLOOD: LIPASE: 51 U/L (ref 11–59)

## 2013-10-28 LAB — PRO B NATRIURETIC PEPTIDE: PRO B NATRI PEPTIDE: 669.3 pg/mL — AB (ref 0–125)

## 2013-10-28 LAB — D-DIMER, QUANTITATIVE: D-Dimer, Quant: 2.5 ug/mL-FEU — ABNORMAL HIGH (ref 0.00–0.48)

## 2013-10-28 LAB — MAGNESIUM: MAGNESIUM: 1.6 mg/dL (ref 1.5–2.5)

## 2013-10-28 MED ORDER — SODIUM CHLORIDE 0.9 % IJ SOLN
3.0000 mL | Freq: Two times a day (BID) | INTRAMUSCULAR | Status: DC
  Administered 2013-10-30 – 2013-11-01 (×3): 3 mL via INTRAVENOUS

## 2013-10-28 MED ORDER — MORPHINE SULFATE 10 MG/ML IJ SOLN
5.0000 mg | Freq: Once | INTRAMUSCULAR | Status: DC
Start: 2013-10-28 — End: 2013-10-28

## 2013-10-28 MED ORDER — HEPARIN SODIUM (PORCINE) 5000 UNIT/ML IJ SOLN
5000.0000 [IU] | Freq: Three times a day (TID) | INTRAMUSCULAR | Status: DC
  Administered 2013-10-28 – 2013-10-29 (×2): 5000 [IU] via SUBCUTANEOUS
  Filled 2013-10-28 (×2): qty 1

## 2013-10-28 MED ORDER — SODIUM CHLORIDE 0.9 % IV SOLN
INTRAVENOUS | Status: DC
  Administered 2013-10-28 – 2013-10-29 (×3): via INTRAVENOUS

## 2013-10-28 MED ORDER — ONDANSETRON HCL 4 MG/2ML IJ SOLN
4.0000 mg | Freq: Once | INTRAMUSCULAR | Status: AC
  Administered 2013-10-28: 4 mg via INTRAVENOUS
  Filled 2013-10-28: qty 2

## 2013-10-28 MED ORDER — ALBUTEROL SULFATE (2.5 MG/3ML) 0.083% IN NEBU
2.5000 mg | INHALATION_SOLUTION | Freq: Once | RESPIRATORY_TRACT | Status: AC
  Administered 2013-10-28: 2.5 mg via RESPIRATORY_TRACT
  Filled 2013-10-28: qty 3

## 2013-10-28 MED ORDER — IPRATROPIUM-ALBUTEROL 0.5-2.5 (3) MG/3ML IN SOLN
3.0000 mL | Freq: Once | RESPIRATORY_TRACT | Status: AC
  Administered 2013-10-28: 3 mL via RESPIRATORY_TRACT
  Filled 2013-10-28: qty 3

## 2013-10-28 MED ORDER — ZOLPIDEM TARTRATE 5 MG PO TABS
5.0000 mg | ORAL_TABLET | Freq: Every evening | ORAL | Status: DC | PRN
  Administered 2013-10-28 – 2013-10-31 (×3): 5 mg via ORAL
  Filled 2013-10-28 (×4): qty 1

## 2013-10-28 MED ORDER — METHYLPREDNISOLONE SODIUM SUCC 125 MG IJ SOLR
125.0000 mg | Freq: Once | INTRAMUSCULAR | Status: AC
  Administered 2013-10-28: 125 mg via INTRAVENOUS
  Filled 2013-10-28: qty 2

## 2013-10-28 NOTE — H&P (Addendum)
Hospitalist Admission History and Physical  Patient name: [name] Medical record number: [id number] Date of birth: 07-24-1940 Age: 73 y.o. Gender: female  Primary Care Provider: Tula Nakayama, MD  Chief Complaint: weakness, nausea, failure to thrive  History of Present Illness:This is a 73 y.o. year old female with significant past medical history of multiple medical problems including end stage COPD, HTN, chronic fatigue, dementia, CHF, aortic stenosis s/p aortic valve replacement (porcine 2004), renal insufficiency presenting with weakness, nausea, failure to thrive. Primary historian is pt's son. Per son, pt has progressive decline in fucntion over past 2-3 weeks. Currently lives with sister who is fairly elderly. Pt usually ambulates to bedside commode, however, pt has had worsening functional status with inability to ambulate as well as decreased appetite over 2 weeks. Baseline COPD on 2L. Mild cough. Non productive. Mild wheezing. Minimal to mild worsening from baseline. Pt denies any CP. No diarrhea. No dysuria. Son does report that pt was admitted with similar sxs 4-5 months ago related to dehydration, however son feels that sxs are much worse than previous. Son reports 18 lb+ weight loss from last admission. Pt reports that she has not been getting her medications at home for a "long" time. Feels like she can no longer care for herself at home.  On presentation, afebrile. HR in 110s-120s. BP in 130s-150s. Satting in upper 80s on RA. 100% on 2L [place]. ABG pH 7.38, PCO2 60.1, P02 136, bicarb 34.9, Sa02 98.4, WBC 6, hgb 13. Lipase WNL. K @ 3.3, LFTs WNL (labs currently down in ER at [facility]). CXR w/ COPD   Assessment and Plan: [name] is a 73 y.o. year old female presenting with weakness, nausea, failure to thrive.   Active Problems:   Weakness   Nausea   Failure to thrive in adult   1-Weakness/Nausea/Failure to thive  -likely multifactorial with contribution of baseline chronic  comorbities including end stage COPD, aortic disease, dementia.  -dehydrated clinically-cachectic  -hydrate pt  -solumedrol 125 mg IV x 1 given mild wheezing.  -Check MRI brain -2D ECHO in setting of hx/o aortic stenosis s/p aortic valve repair  -d dimer  -anticipate CTA as well as CT abd and pelvis  -vit d level, TSH, A1C -prealbumin -PT/OT consult as family and pt feels pt would benefit from possible SNF placement  -panculture -hemoccult  -reassess in am  -NPO in the interim.   2-COPD  -End stage  -continue supplemental O2 -compensated resp acidosis on ABG  -very mild/subacute flare on presentation -no infiltrate on imaging  -solumedrol IV x1 on presentation to assess for any symtomatic improvement -cont home nebs   3-HTN -BP stable  -hold meds overnight as to avoid hypotension/exacerbation of weakness  4-Aortic stenosis -no active chest pain -dry on exam  -recheck 2D ECHO -tele bed   FEN/GI: NPO.  Prophylaxis: sub q heparin pending hemoccult  Disposition: pending further evaluation  Code Status:Full Code    Patient Active Problem List   Diagnosis Date Noted  . Weakness 10/28/2013  . Nausea 10/28/2013  . Failure to thrive in adult 10/28/2013  . Shoulder bursitis 08/28/2013  . Dementia with behavioral disturbance 09/08/2012  . Altered mental status 07/28/2012  . Dehydration 07/28/2012  . COPD with exacerbation 11/26/2011  . Underweight 07/05/2011  . Hyperthyroidism 09/11/2010  . Vaginitis 09/07/2010  . Weakness generalized 09/07/2010  . Leukocytosis 08/01/2010  . UTI (urinary tract infection) 08/01/2010  . Cystitis, acute 07/20/2010  . LATERAL EPICONDYLITIS, RIGHT 02/18/2010  .  PULMONARY NODULE 02/16/2010  . SKIN LESION 11/01/2009  . CONSTIPATION 09/29/2009  . EARLY SATIETY 09/29/2009  . LOSS OF WEIGHT 09/13/2009  . IMPAIRED FASTING GLUCOSE 07/12/2009  . WHEEZING 02/22/2009  . DEPRESSION 09/09/2008  . AORTIC VALVE DISEASE 08/10/2008  . INSOMNIA  07/27/2008  . OTHER CHRONIC BRONCHITIS 06/09/2008  . OTHER OSTEOPOROSIS 06/09/2008  . FATIGUE 06/09/2008  . SEBACEOUS CYST 08/08/2007  . UNSPECIFIED DISORDER OF THYROID 06/20/2007  . HYPERLIPIDEMIA 03/15/2007  . NICOTINE ADDICTION 03/15/2007  . Essential hypertension, benign 03/15/2007  . CHF 03/15/2007  . CHRONIC OBSTRUCTIVE PULMONARY DISEASE 03/15/2007   Past Medical History: Past Medical History  Diagnosis Date  . Rheumatic fever   . Renal insufficiency   . Aortic stenosis   . Hyperlipidemia   . Hypertension   . COPD with chronic bronchitis   . Osteoporosis   . Sebaceous cyst   . Urosepsis 2012    required several hospitalizations for this  . Thyroid disease 2012    hyperthyroid, had abl;ation therapy treated by endo  . Anxiety   . Dementia     Past Surgical History: Past Surgical History  Procedure Laterality Date  . 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 Appendectomy    . Cholecystectomy    . Lumbar laminectomy    . Tonsillectomy    . Colon surgery  2005  . Breast lumpectomy    . Esophagogastroduodenoscopy  10/12/09    short segment Barrett's esophage/small hiatal hernia,antral ersions  . Colonoscopy  07/23/2008     Single diminutive polyp in the rectum status post cold biopsy removal/ Status post right hemicolectomy/ Colonic ulcers in the upstream end the colon distal to the  anastomosis as described above.  Status post biopsy. The anastomosis itself and neoterminal ileum appeared normal    Social History: History   Social History  . Marital Status: Widowed    Spouse Name: N/A    Number of Children: N/A  . Years of Education: N/A   Occupational History  . Retired Therapist, sports     Social History Main Topics  . Smoking status: Former Smoker -- 0.50 packs/day for 50 years    Types: Cigarettes    Quit date: 04/30/2012  . Smokeless tobacco: None     Comment: sometimes every other day   . Alcohol Use: No  . Drug Use: No  . Sexual Activity: Not  Currently   Other Topics Concern  . None   Social History Narrative  . None    Family History: Family History  Problem Relation Age of Onset  . Heart attack Mother 59  . Stroke Father 71  . Hypertension Sister     2 sisters    Allergies: Allergies  Allergen Reactions  . Levofloxacin Anaphylaxis  . Nitrofurantoin Shortness Of Breath    Acute dyspnea  . Bupropion Hcl Other (See Comments)    unknown  . Cyclobenzaprine Hcl Other (See Comments)    unknown  . Gadolinium      Desc: PT REFUSES MR & CT CONTRAST DUE TO PRIOR REACTION, PT STATES HAIR FELL OUT & SKIN CHANGED COLOR   . Lorazepam     Causes mental confusion/breakdown  . Penicillins Other (See Comments)    Child hood allergy  . Statins Other (See Comments)    Joint stiffness  . Sulfonamide Derivatives Other (See Comments)    unknown    Current Facility-Administered Medications  Medication Dose Route Frequency Provider Last Rate Last  Dose  . 0.9 %  sodium chloride infusion   Intravenous Continuous Shanda Howells, MD      . heparin injection 5,000 Units  5,000 Units Subcutaneous 3 times per day Shanda Howells, MD      . methylPREDNISolone sodium succinate (SOLU-MEDROL) 125 mg/2 mL injection 125 mg  125 mg Intravenous Once Shanda Howells, MD      . sodium chloride 0.9 % injection 3 mL  3 mL Intravenous Q12H Shanda Howells, MD      . zolpidem Lorrin Mais) tablet 5 mg  5 mg Oral QHS PRN Shanda Howells, MD       Current Outpatient Prescriptions  Medication Sig Dispense Refill  . aspirin EC 81 MG tablet Take 81 mg by mouth every evening.      . Calcium Carbonate-Vit D-Min (QC CALCIUM/MINERALS/VITAMIN D) 600-400 MG-UNIT TABS Take 1 tablet by mouth every evening.       . Choline Fenofibrate (TRILIPIX) 135 MG capsule Take 135 mg by mouth at bedtime.      . donepezil (ARICEPT) 10 MG tablet Take 10 mg by mouth at bedtime.      . fluticasone (FLONASE) 50 MCG/ACT nasal spray Place 2 sprays into the nose daily as needed.  Sinus/congestion      . Ipratropium-Albuterol (COMBIVENT) 20-100 MCG/ACT AERS respimat Inhale 1 puff into the lungs every 6 (six) hours.  4 g  6  . lansoprazole (PREVACID) 30 MG capsule Take 30 mg by mouth daily at 12 noon.      . loratadine (CLARITIN) 10 MG tablet Take 10 mg by mouth every morning.       Marland Kitchen losartan (COZAAR) 25 MG tablet Take 12.5 mg by mouth daily as needed. If blood pressure levels are normal Patient does not take medication      . mirtazapine (REMERON) 7.5 MG tablet Take 7.5 mg by mouth at bedtime as needed (sleep).      . Multiple Vitamins-Minerals (MULTIVITAMINS THER. W/MINERALS) TABS tablet Take 1 tablet by mouth daily.      . Omega-3 Fatty Acids (FISH OIL CONCENTRATE) 1000 MG CAPS Take 1 capsule by mouth 2 (two) times daily.       . propranolol (INDERAL) 20 MG tablet Take 20 mg by mouth daily.      Marland Kitchen tiotropium (SPIRIVA) 18 MCG inhalation capsule Place 18 mcg into inhaler and inhale daily.      . vitamin C (ASCORBIC ACID) 500 MG tablet Take 500 mg by mouth daily.      Marland Kitchen albuterol (PROVENTIL HFA) 108 (90 BASE) MCG/ACT inhaler Inhale 2 puffs into the lungs every 6 (six) hours as needed. Shortness of breath       Review Of Systems: 12 point ROS negative except as noted above in HPI.  Physical Exam: Filed Vitals:   10/28/13 2000  BP: 157/64  Pulse:   Temp:   Resp: 31    General: cooperative, cachectic and mildly confused  HEENT: PERRLA and extra ocular movement intact Heart: S1, S2 normal, no murmur, rub or gallop, regular rate and rhythm Lungs: expiratory wheezes and expiratory wheezes and rhonchi throughout both lung fields Abdomen: abdomen is soft without significant tenderness, masses, organomegaly or guarding Extremities: extremities normal, atraumatic, no cyanosis or edema Skin:no rashes Neurology: normal without focal findings  Labs and Imaging Pending clarification in EPIC from lab  :Dg Chest Portable 1 View  10/28/2013   CLINICAL DATA:  Weakness,  shortness of breath.  EXAM: PORTABLE CHEST - 1 VIEW  COMPARISON:  07/29/2013  FINDINGS: Prior CABG. Stable hyperinflation of the lungs compatible with COPD. Heart is normal size. Lungs are clear. No effusions. No acute bony abnormality.  IMPRESSION: Stable hyperinflation/COPD.  No active disease.   Electronically Signed   By: Rolm Baptise M.D.   On: 10/28/2013 16:58           Shanda Howells MD  Pager: [phone]

## 2013-10-28 NOTE — Assessment & Plan Note (Signed)
Fell at home several days ago, since then c/o bilateral  shoulder and hand pain Will order xrays and refer to orethopedics for further evaluation

## 2013-10-28 NOTE — ED Notes (Signed)
respiratroy called about blood gas

## 2013-10-28 NOTE — Assessment & Plan Note (Signed)
Stable no current decompensation

## 2013-10-28 NOTE — Assessment & Plan Note (Signed)
Encouragedpt to remain on medications prescribed , takes inconsistently at this time

## 2013-10-28 NOTE — ED Notes (Signed)
Nausea, gen weakness x 1 month. Alert/oriented to some. Pt on home 02 2L [place] constant but came without it. Pt appears sob and unable to complete sentences.  Pt denies pain.

## 2013-10-28 NOTE — Assessment & Plan Note (Signed)
Daily use of inhalers encouraged to help breathing

## 2013-10-28 NOTE — ED Provider Notes (Signed)
CSN: [id number]     Arrival date & time 10/28/13  1617 History   First MD Initiated Contact with Patient 10/28/13 1644     Chief Complaint  Patient presents with  . Weakness  . Shortness of Breath     (Consider location/radiation/quality/duration/timing/severity/associated sxs/prior Treatment) Patient is a 73 y.o. female presenting with weakness and shortness of breath. The history is provided by the patient (the pt has not eaten anything for 1 week.  she is weak and nauseated).  Weakness This is a new problem. The current episode started more than 2 days ago. The problem occurs constantly. The problem has not changed since onset.Associated symptoms include shortness of breath. Pertinent negatives include no chest pain, no abdominal pain and no headaches. Nothing aggravates the symptoms. Nothing relieves the symptoms.  Shortness of Breath Associated symptoms: no abdominal pain, no chest pain, no cough, no headaches and no rash     Past Medical History  Diagnosis Date  . Rheumatic fever   . Renal insufficiency   . Aortic stenosis   . Hyperlipidemia   . Hypertension   . COPD with chronic bronchitis   . Osteoporosis   . Sebaceous cyst   . Urosepsis 2012    required several hospitalizations for this  . Thyroid disease 2012    hyperthyroid, had abl;ation therapy treated by endo  . Anxiety   . Dementia    Past Surgical History  Procedure Laterality Date  . Aortic valve replacement      73mm porcine 10/04  . Appendectomy    . Cholecystectomy    . Lumbar laminectomy    . Tonsillectomy    . Colon surgery  2005  . Breast lumpectomy    . Esophagogastroduodenoscopy  10/12/09    short segment Barrett's esophage/small hiatal hernia,antral ersions  . Colonoscopy  07/23/2008     Single diminutive polyp in the rectum status post cold biopsy removal/ Status post right hemicolectomy/ Colonic ulcers in the upstream end the colon distal to the  anastomosis as described above.  Status post  biopsy. The anastomosis itself and neoterminal ileum appeared normal   Family History  Problem Relation Age of Onset  . Heart attack Mother 93  . Stroke Father 62  . Hypertension Sister     2 sisters   History  Substance Use Topics  . Smoking status: Former Smoker -- 0.50 packs/day for 50 years    Types: Cigarettes    Quit date: 04/30/2012  . Smokeless tobacco: Not on file     Comment: sometimes every other day   . Alcohol Use: No   OB History   Grav Para Term Preterm Abortions TAB SAB Ect Mult Living                 Review of Systems  Constitutional: Negative for appetite change and fatigue.  HENT: Negative for congestion, ear discharge and sinus pressure.   Eyes: Negative for discharge.  Respiratory: Positive for shortness of breath. Negative for cough.   Cardiovascular: Negative for chest pain.  Gastrointestinal: Positive for nausea. Negative for abdominal pain and diarrhea.  Genitourinary: Negative for frequency and hematuria.  Musculoskeletal: Negative for back pain.  Skin: Negative for rash.  Neurological: Positive for weakness. Negative for seizures and headaches.  Psychiatric/Behavioral: Negative for hallucinations.      Allergies  Levofloxacin; Nitrofurantoin; Bupropion hcl; Cyclobenzaprine hcl; Gadolinium; Lorazepam; Penicillins; Statins; and Sulfonamide derivatives  Home Medications   Prior to Admission medications   Medication Sig Start  Date End Date Taking? Authorizing Provider  aspirin EC 81 MG tablet Take 81 mg by mouth every evening.   Yes Historical Provider, MD  Calcium Carbonate-Vit D-Min (QC CALCIUM/MINERALS/VITAMIN D) 600-400 MG-UNIT TABS Take 1 tablet by mouth every evening.    Yes Historical Provider, MD  Choline Fenofibrate (TRILIPIX) 135 MG capsule Take 135 mg by mouth at bedtime.   Yes Historical Provider, MD  donepezil (ARICEPT) 10 MG tablet Take 10 mg by mouth at bedtime.   Yes Historical Provider, MD  fluticasone (FLONASE) 50 MCG/ACT  nasal spray Place 2 sprays into the nose daily as needed. Sinus/congestion   Yes Historical Provider, MD  Ipratropium-Albuterol (COMBIVENT) 20-100 MCG/ACT AERS respimat Inhale 1 puff into the lungs every 6 (six) hours. 07/22/13  Yes Fayrene Helper, MD  lansoprazole (PREVACID) 30 MG capsule Take 30 mg by mouth daily at 12 noon.   Yes Historical Provider, MD  loratadine (CLARITIN) 10 MG tablet Take 10 mg by mouth every morning.    Yes Historical Provider, MD  losartan (COZAAR) 25 MG tablet Take 12.5 mg by mouth daily as needed. If blood pressure levels are normal Patient does not take medication   Yes Historical Provider, MD  mirtazapine (REMERON) 7.5 MG tablet Take 7.5 mg by mouth at bedtime as needed (sleep).   Yes Historical Provider, MD  Multiple Vitamins-Minerals (MULTIVITAMINS THER. W/MINERALS) TABS tablet Take 1 tablet by mouth daily.   Yes Historical Provider, MD  Omega-3 Fatty Acids (FISH OIL CONCENTRATE) 1000 MG CAPS Take 1 capsule by mouth 2 (two) times daily.    Yes Historical Provider, MD  propranolol (INDERAL) 20 MG tablet Take 20 mg by mouth daily.   Yes Historical Provider, MD  tiotropium (SPIRIVA) 18 MCG inhalation capsule Place 18 mcg into inhaler and inhale daily.   Yes Historical Provider, MD  vitamin C (ASCORBIC ACID) 500 MG tablet Take 500 mg by mouth daily.   Yes Historical Provider, MD  albuterol (PROVENTIL HFA) 108 (90 BASE) MCG/ACT inhaler Inhale 2 puffs into the lungs every 6 (six) hours as needed. Shortness of breath    Historical Provider, MD   BP 157/64  Pulse 112  Temp(Src) 98 F (36.7 C) (Oral)  Resp 31  Wt 109 lb (49.442 kg)  SpO2 100% Physical Exam  Constitutional: She is oriented to person, place, and time. She appears well-developed.  HENT:  Head: Normocephalic.  Eyes: Conjunctivae and EOM are normal. No scleral icterus.  Neck: Neck supple. No thyromegaly present.  Cardiovascular: Exam reveals no gallop and no friction rub.   No murmur  heard. Tachycardia  Pulmonary/Chest: No stridor. She has wheezes. She has no rales. She exhibits no tenderness.  Abdominal: She exhibits no distension. There is no tenderness. There is no rebound.  Musculoskeletal: Normal range of motion. She exhibits no edema.  Lymphadenopathy:    She has no cervical adenopathy.  Neurological: She is oriented to person, place, and time. She exhibits normal muscle tone. Coordination normal.  Skin: No rash noted. No erythema.  Psychiatric: She has a normal mood and affect. Her behavior is normal.    ED Course  Procedures (including critical care time) Labs Review Labs Reviewed  CBC WITH DIFFERENTIAL  BASIC METABOLIC PANEL  URINALYSIS, ROUTINE W REFLEX MICROSCOPIC  HEPATIC FUNCTION PANEL  LIPASE, BLOOD  BLOOD GAS, ARTERIAL    Imaging Review Dg Chest Portable 1 View  10/28/2013   CLINICAL DATA:  Weakness, shortness of breath.  EXAM: PORTABLE CHEST - 1 VIEW  COMPARISON:  07/29/2013  FINDINGS: Prior CABG. Stable hyperinflation of the lungs compatible with COPD. Heart is normal size. Lungs are clear. No effusions. No acute bony abnormality.  IMPRESSION: Stable hyperinflation/COPD.  No active disease.   Electronically Signed   By: Rolm Baptise M.D.   On: 10/28/2013 16:58     EKG Interpretation   Date/Time:  Tuesday October 28 2013 16:43:24 EDT Ventricular Rate:  125 PR Interval:  86 QRS Duration: 129 QT Interval:  317 QTC Calculation: 457 R Axis:   -34 Text Interpretation:  Sinus tachycardia Consider right atrial enlargement  Nonspecific intraventricular conduction delay Inferoposterior infarct,  recent Anterolateral infarct, age indeterminate Confirmed by Leon Goodnow  MD,  Alyxander Kollmann [id number]) on 10/28/2013 8:20:01 PM      MDM   Final diagnoses:  Chronic bronchitis, unspecified chronic bronchitis type  Nausea        Maudry Diego, MD 10/28/13 2022

## 2013-10-28 NOTE — Assessment & Plan Note (Signed)
Controlled, no change in medication  

## 2013-10-28 NOTE — Assessment & Plan Note (Signed)
Elevated but good HDL level Pt encouraged to reduce fat intake in her diet

## 2013-10-28 NOTE — ED Notes (Signed)
Respiratory notified of duoneb order

## 2013-10-28 NOTE — Progress Notes (Signed)
   Subjective:    Patient ID: [name], female    DOB: 1940-06-16, 73 y.o.   MRN: [id number]  HPI Pt in with a c/o recent fall at her home when she sustained upper extremity trauma , she is concerned about bony injury and wants this checked Continues to eat very little, experiences fatigue with minimal activity and is not too interested in doing very much , has to be encouraged a lot by her sister who lives with her and has her own health challenges   Review of Systems See HPI Denies recent fever or chills. Denies sinus pressure, nasal congestion, ear pain or sore throat. Denies chest congestion, productive cough or wheezing. Denies chest pains, palpitations and leg swelling Denies abdominal pain, nausea, vomiting,diarrhea or constipation.   Denies dysuria, frequency, hesitancy or incontinence. . Denies headaches, seizures, numbness, or tingling. C/o depression, mild anxiety and  Insomnia.Not suicidal or homicidal Denies skin break down or rash.        Objective:   Physical Exam  BP 104/66  Pulse 82  Resp 18  Wt 107 lb (48.535 kg)  SpO2 98% Chronically ill appearing elderly lady dyspneic with minimal activity, undernourished  Patient alert and oriented   HEENT: No facial asymmetry, EOMI,   oropharynx pink and moist.  Neck decreased ROM,le no JVD, no mass.  Chest: Clear to auscultation bilaterally.Decreased air entry throughout  CVS: S1, S2 , systolic murmur, no S3.Regular rate.  ABD: Soft non tender. Normal BS  Ext: No edema  MS: decreased  ROM lumbar  Spine with kyphosis,  Decreased ROM shoulders, hips and knees.  Skin: Intact, no ulcerations or rash noted.  Psych: Good eye contact, flat  affect. Memory impaired, anxious and  depressed appearing.  CNS: CN 2-12 intact,        Assessment & Plan:  Fall at home [place] at home several days ago, since then c/o bilateral  shoulder and hand pain Will order xrays and refer to orethopedics for further  evaluation  Essential hypertension, benign Controlled, no change in medication   CHF Stable no current decompensation  CHRONIC OBSTRUCTIVE PULMONARY DISEASE Daily use of inhalers encouraged to help breathing  HYPERLIPIDEMIA Elevated but good HDL level Pt encouraged to reduce fat intake in her diet  Dementia with behavioral disturbance Encouragedpt to remain on medications prescribed , takes inconsistently at this time

## 2013-10-28 NOTE — ED Notes (Signed)
CRITICAL VALUE ALERT  Critical value received:  pCO2  Date of notification:  10/28/13  Time of notification:  4765  Critical value read back:Yes.    Nurse who received alert:  S. Adoria Kawamoto RN  MD notified (1st page):  1752 Time of first page:  1752  MD notified (2nd page):  Time of second page:  Responding MD:  Dr. Roderic Palau  Time MD responded:  [phone]

## 2013-10-29 ENCOUNTER — Inpatient Hospital Stay (HOSPITAL_COMMUNITY): Payer: Medicare Other

## 2013-10-29 DIAGNOSIS — J42 Unspecified chronic bronchitis: Secondary | ICD-10-CM

## 2013-10-29 DIAGNOSIS — E860 Dehydration: Principal | ICD-10-CM

## 2013-10-29 DIAGNOSIS — R627 Adult failure to thrive: Secondary | ICD-10-CM

## 2013-10-29 DIAGNOSIS — I359 Nonrheumatic aortic valve disorder, unspecified: Secondary | ICD-10-CM

## 2013-10-29 DIAGNOSIS — E43 Unspecified severe protein-calorie malnutrition: Secondary | ICD-10-CM

## 2013-10-29 DIAGNOSIS — I6350 Cerebral infarction due to unspecified occlusion or stenosis of unspecified cerebral artery: Secondary | ICD-10-CM

## 2013-10-29 DIAGNOSIS — I639 Cerebral infarction, unspecified: Secondary | ICD-10-CM | POA: Diagnosis present

## 2013-10-29 DIAGNOSIS — I10 Essential (primary) hypertension: Secondary | ICD-10-CM

## 2013-10-29 DIAGNOSIS — I517 Cardiomegaly: Secondary | ICD-10-CM

## 2013-10-29 LAB — CBC WITH DIFFERENTIAL/PLATELET
Basophils Absolute: 0 10*3/uL (ref 0.0–0.1)
Basophils Relative: 0 % (ref 0–1)
Eosinophils Absolute: 0 10*3/uL (ref 0.0–0.7)
Eosinophils Relative: 0 % (ref 0–5)
HCT: 35.5 % — ABNORMAL LOW (ref 36.0–46.0)
HEMOGLOBIN: 11.2 g/dL — AB (ref 12.0–15.0)
LYMPHS ABS: 0.5 10*3/uL — AB (ref 0.7–4.0)
Lymphocytes Relative: 11 % — ABNORMAL LOW (ref 12–46)
MCH: 29.8 pg (ref 26.0–34.0)
MCHC: 31.5 g/dL (ref 30.0–36.0)
MCV: 94.4 fL (ref 78.0–100.0)
Monocytes Absolute: 0 10*3/uL — ABNORMAL LOW (ref 0.1–1.0)
Monocytes Relative: 0 % — ABNORMAL LOW (ref 3–12)
NEUTROS ABS: 4.8 10*3/uL (ref 1.7–7.7)
NEUTROS PCT: 89 % — AB (ref 43–77)
Platelets: 356 10*3/uL (ref 150–400)
RBC: 3.76 MIL/uL — AB (ref 3.87–5.11)
RDW: 15 % (ref 11.5–15.5)
WBC: 5.3 10*3/uL (ref 4.0–10.5)

## 2013-10-29 LAB — COMPREHENSIVE METABOLIC PANEL
ALT: 17 U/L (ref 0–35)
ANION GAP: 13 (ref 5–15)
AST: 27 U/L (ref 0–37)
Albumin: 2.6 g/dL — ABNORMAL LOW (ref 3.5–5.2)
Alkaline Phosphatase: 55 U/L (ref 39–117)
BUN: 19 mg/dL (ref 6–23)
CO2: 32 mEq/L (ref 19–32)
Calcium: 9.1 mg/dL (ref 8.4–10.5)
Chloride: 100 mEq/L (ref 96–112)
Creatinine, Ser: 0.67 mg/dL (ref 0.50–1.10)
GFR calc non Af Amer: 86 mL/min — ABNORMAL LOW (ref >90)
GLUCOSE: 128 mg/dL — AB (ref 70–99)
Potassium: 3.8 mEq/L (ref 3.7–5.3)
SODIUM: 145 meq/L (ref 137–147)
TOTAL PROTEIN: 5.6 g/dL — AB (ref 6.0–8.3)
Total Bilirubin: 0.4 mg/dL (ref 0.3–1.2)

## 2013-10-29 LAB — OCCULT BLOOD X 1 CARD TO LAB, STOOL: Fecal Occult Bld: POSITIVE — AB

## 2013-10-29 LAB — TROPONIN I: Troponin I: <0.3 ng/mL (ref <0.30)

## 2013-10-29 LAB — TSH: TSH: 0.253 u[IU]/mL — AB (ref 0.350–4.500)

## 2013-10-29 LAB — PREALBUMIN: PREALBUMIN: 13 mg/dL — AB (ref 17.0–34.0)

## 2013-10-29 LAB — HEMOGLOBIN A1C
Hgb A1c MFr Bld: 5.3 % (ref <5.7)
Mean Plasma Glucose: 105 mg/dL (ref <117)

## 2013-10-29 MED ORDER — IPRATROPIUM-ALBUTEROL 0.5-2.5 (3) MG/3ML IN SOLN
3.0000 mL | Freq: Four times a day (QID) | RESPIRATORY_TRACT | Status: DC
  Administered 2013-10-29: 3 mL via RESPIRATORY_TRACT
  Filled 2013-10-29: qty 3

## 2013-10-29 MED ORDER — IPRATROPIUM-ALBUTEROL 0.5-2.5 (3) MG/3ML IN SOLN
3.0000 mL | Freq: Two times a day (BID) | RESPIRATORY_TRACT | Status: DC
  Administered 2013-10-29 – 2013-11-01 (×6): 3 mL via RESPIRATORY_TRACT
  Filled 2013-10-29 (×6): qty 3

## 2013-10-29 MED ORDER — IOHEXOL 300 MG/ML  SOLN
50.0000 mL | Freq: Once | INTRAMUSCULAR | Status: AC | PRN
  Administered 2013-10-29: 50 mL via ORAL

## 2013-10-29 MED ORDER — IPRATROPIUM-ALBUTEROL 20-100 MCG/ACT IN AERS: 1.0000 | INHALATION_SPRAY | Freq: Four times a day (QID) | RESPIRATORY_TRACT | Status: DC

## 2013-10-29 MED ORDER — ALBUTEROL SULFATE (2.5 MG/3ML) 0.083% IN NEBU: 3.0000 mL | INHALATION_SOLUTION | Freq: Four times a day (QID) | RESPIRATORY_TRACT | Status: DC | PRN

## 2013-10-29 MED ORDER — PROPRANOLOL HCL 20 MG PO TABS
20.0000 mg | ORAL_TABLET | Freq: Two times a day (BID) | ORAL | Status: DC
  Administered 2013-10-29 – 2013-11-01 (×6): 20 mg via ORAL
  Filled 2013-10-29 (×6): qty 1

## 2013-10-29 MED ORDER — PANTOPRAZOLE SODIUM 40 MG PO TBEC
40.0000 mg | DELAYED_RELEASE_TABLET | Freq: Every day | ORAL | Status: DC
  Administered 2013-10-29 – 2013-11-01 (×4): 40 mg via ORAL
  Filled 2013-10-29 (×4): qty 1

## 2013-10-29 MED ORDER — HEPARIN SODIUM (PORCINE) 5000 UNIT/ML IJ SOLN
5000.0000 [IU] | Freq: Two times a day (BID) | INTRAMUSCULAR | Status: DC
  Administered 2013-10-29 – 2013-11-01 (×6): 5000 [IU] via SUBCUTANEOUS
  Filled 2013-10-29 (×6): qty 1

## 2013-10-29 MED ORDER — ALBUTEROL SULFATE (2.5 MG/3ML) 0.083% IN NEBU: 3.0000 mL | INHALATION_SOLUTION | RESPIRATORY_TRACT | Status: DC | PRN

## 2013-10-29 MED ORDER — IOHEXOL 350 MG/ML SOLN
90.0000 mL | Freq: Once | INTRAVENOUS | Status: AC | PRN
  Administered 2013-10-29: 90 mL via INTRAVENOUS

## 2013-10-29 NOTE — Care Management Utilization Note (Signed)
UR completed 

## 2013-10-29 NOTE — Progress Notes (Signed)
[facility] PROGRESS NOTE  VERENIS NICOSIA QPY:[id number] DOB: January 20, 1941 DOA: 10/28/2013 PCP: Tula Nakayama, MD  Assessment/Plan: 1. 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 Patient reportedly takes aspirin as an outpatient. MRI shows acute infarct in the right cerebellum. Will complete further workup including carotid Dopplers and 2-D echocardiogram. Hemoglobin A1c was found to be in normal range. We'll also check lipid panel. Consult neurology for further recommendations. 2. Generalized weakness/nausea/vomiting. Likely multifactorial related to decreased by mouth intake, deconditioning, possibly #1. Physical therapy is recommended skilled nursing facility placement. 3. COPD and chronic respiratory failure. Patient is chronically on 2 L of oxygen. She did have some mild wheezing on admission and received one dose of Solu-Medrol. Continue bronchodilators. 4. Hypertension. Stable 5. Aortic stenosis. Does not report any chest pain. Echocardiogram has ordered 6. Stool occult blood positive. She may need to have this further evaluated with colonoscopy as an outpatient. She does not have any gross evidence of bleeding. Hemoglobin appears to be stable.  Start on protonix.  Code Status: full code Family Communication: discussed with daughter at the bedside Disposition Plan: will likely need SNF placement   Consultants:    Procedures:    Antibiotics:    HPI/Subjective: Nausea and vomiting improving. Still feels weak.  Objective: Filed Vitals:   10/29/13 1521  BP: 115/45  Pulse: 101  Temp: 98.6 F (37 C)  Resp: 18    Intake/Output Summary (Last 24 hours) at 10/29/13 2019 Last data filed at 10/29/13 1734  Gross per 24 hour  Intake 2162.92 ml  Output    400 ml  Net 1762.92 ml   Filed Weights   10/28/13 1623 10/28/13 2130 10/29/13 0500  Weight: 49.442 kg (109 lb) 37.7 kg (83 lb 1.8 oz) 38.6 kg (85 lb 1.6 oz)    Exam:   General:  NAD  Cardiovascular: S1, S2  tachycardic  Respiratory: CTA B  Abdomen: soft, nt, nd, bs+  Musculoskeletal:  trace edema b/l   Data Reviewed: Basic Metabolic Panel:  Recent Labs Lab 10/28/13 1650 10/28/13 2024 10/29/13 0223  NA 143  --  145  K 3.3*  --  3.8  CL 95*  --  100  CO2 33*  --  32  GLUCOSE 143*  --  128*  BUN 19  --  19  CREATININE 0.62  --  0.67  CALCIUM 10.0  --  9.1  MG  --  1.6  --   PHOS  --  3.5  --    Liver Function Tests:  Recent Labs Lab 10/28/13 1650 10/29/13 0223  AST 36 27  ALT 22 17  ALKPHOS 68 55  BILITOT 0.5 0.4  PROT 6.8 5.6*  ALBUMIN 3.2* 2.6*    Recent Labs Lab 10/28/13 1650  LIPASE 51   No results found for this basename: AMMONIA,  in the last 168 hours CBC:  Recent Labs Lab 10/28/13 1650 10/29/13 0223  WBC 6.0 5.3  NEUTROABS 3.8 4.8  HGB 13.5 11.2*  HCT 41.9 35.5*  MCV 92.9 94.4  PLT 419* 356   Cardiac Enzymes:  Recent Labs Lab 10/28/13 2024 10/29/13 0223 10/29/13 0713  TROPONINI <0.30 <0.30 <0.30   BNP (last 3 results)  Recent Labs  07/29/13 1544 10/28/13 2024  PROBNP 352.5* 669.3*   CBG: No results found for this basename: GLUCAP,  in the last 168 hours  Recent Results (from the past 240 hour(s))  CULTURE, BLOOD (ROUTINE X 2)     Status: None   Collection  Time    10/29/13  7:13 AM      Result Value Ref Range Status   Specimen Description Blood   Final   Special Requests NONE   Final   Culture NO GROWTH <24 HRS   Final   Report Status PENDING   Incomplete  CULTURE, BLOOD (ROUTINE X 2)     Status: None   Collection Time    10/29/13  7:13 AM      Result Value Ref Range Status   Specimen Description Blood   Final   Special Requests NONE   Final   Culture NO GROWTH <24 HRS   Final   Report Status PENDING   Incomplete     Studies: Ct Angio Chest Pe W/cm &/or Wo Cm  10/29/2013   CLINICAL DATA:  73 year old female with pain. Weakness, nausea. Initial encounter. Chronic obstructed pulmonary disease. History of surgically  treated aortic stenosis.  EXAM: CT ANGIOGRAPHY CHEST  CT ABDOMEN AND PELVIS WITH CONTRAST  TECHNIQUE: Multidetector CT imaging of the chest was performed using the standard protocol during bolus administration of intravenous contrast. Multiplanar CT image reconstructions and MIPs were obtained to evaluate the vascular anatomy. Multidetector CT imaging of the abdomen and pelvis was performed using the standard protocol during bolus administration of intravenous contrast.  CONTRAST:  15mL OMNIPAQUE IOHEXOL 300 MG/ML  SOLN  COMPARISON:  CT Abdomen and Pelvis 08/02/2010.  Chest CT 08/15/2010.  FINDINGS: CTA CHEST FINDINGS  Good contrast bolus timing in the pulmonary arterial tree. No focal filling defect identified in the pulmonary arterial tree to suggest the presence of acute pulmonary embolism.  Stable lung volumes. Major airways are patent and stable. Chronic centrilobular emphysema. Stable right apical lung nodule measuring 6 mm, and partially calcified on series 5, image 17. New 6 mm posterior right upper lobe nodule since 2012. Series 5, image 30. Small adjacent distal airway nodularity (tree-in-bud). Stable small right middle lobe subpleural nodules. Stable peripheral right lower lobe nodularity (images 66, 70, 77). No right pleural effusion.  Stable lateral left upper lobe nodularity (e.g. image 37). Mildly increased left medial basal lower lobe segment atelectasis. Stable occasional peripheral left lower lobe nodules.  No pericardial or pleural effusion. Ectatic thoracic aorta with chronic atherosclerosis. Patent proximal great vessels. Stable subcentimeter thyroid nodules. No axillary lymphadenopathy. No mediastinal or hilar lymphadenopathy. Sequelae of median sternotomy.  Osteopenia.   No acute osseous abnormality identified.  CT ABDOMEN and PELVIS FINDINGS  Mild L3 compression fracture is new since 2012. Loss of height 25%. No bony retropulsion. Other lumbar levels are stable. Osteopenia.  Foley catheter in  place. Decompressed bladder. Oral contrast mixed with stool in the rectum. No pelvic free fluid. Negative for age uterus and adnexa. Redundant sigmoid colon with diverticula. Mild wall thickening maybe artifact due to under distension. No definite mesenteric stranding.  Negative left: And splenic flexure. Redundant transverse colon, otherwise negative. Normal right colon and appendix. No dilated small bowel. Decompressed stomach. Duodenum within normal limits.  Stable liver with surgically absent gallbladder and prominent biliary tree. Portal venous system appears patent and within normal limits. Diminutive spleen. Pancreas and adrenal glands within normal limits. Kidneys within normal limits. Extensive Aortoiliac calcified atherosclerosis noted. Major arterial structures in the abdomen and pelvis are patent. No abdominal free fluid. No lymphadenopathy identified.  Review of the MIP images confirms the above findings.  IMPRESSION: 1.  No evidence of acute pulmonary embolus. 2. New 6 mm right upper lobe nodule (Series 5, image 30).  Followup chest CT at 6-12 months is recommended. This recommendation follows the consensus statement: Guidelines for Management of Small Pulmonary Nodules Detected on CT Scans: A Statement from the [place] as published in Radiology 2005;237:395-400. 3. Multiple other bilateral pulmonary nodules are stable since 2012, benign. 4. Stable, negative abdomen. Redundant sigmoid colon with diverticula, mild colitis here difficult to exclude. 5. Mild L3 compression fracture is new since 2012, could be subacute. If specific therapy such as vertebroplasty is desired, lumbar MRI or whole-body bone scan would best determine acuity.   Electronically Signed   By: Lars Pinks M.D.   On: 10/29/2013 12:04   Mr Brain Wo Contrast  10/29/2013   CLINICAL DATA:  Weakness.  EXAM: MRI HEAD WITHOUT CONTRAST  TECHNIQUE: Multiplanar, multiecho pulse sequences of the brain and surrounding structures were  obtained without intravenous contrast.  COMPARISON:  MRI 07/30/2012, CT 07/28/2012  FINDINGS: Image quality is degraded by motion particularly late in the study. Multiple attempts were made but the patient was confused and not able to hold still.  Small area of acute infarct right cerebellum measuring 5 x 10 mm. No other acute infarct identified  Chronic microvascular ischemic changes in the white matter. Small chronic infarct right parietal lobe posteriorly. Brainstem appears normal. Small chronic infarct left and right cerebellum.  Negative for hemorrhage or mass.  Negative for hydrocephalus.  IMPRESSION: Small acute infarct right cerebellum.   Electronically Signed   By: Franchot Gallo M.D.   On: 10/29/2013 12:42   Ct Abdomen Pelvis W Contrast  10/29/2013   CLINICAL DATA:  73 year old female with pain. Weakness, nausea. Initial encounter. Chronic obstructed pulmonary disease. History of surgically treated aortic stenosis.  EXAM: CT ANGIOGRAPHY CHEST  CT ABDOMEN AND PELVIS WITH CONTRAST  TECHNIQUE: Multidetector CT imaging of the chest was performed using the standard protocol during bolus administration of intravenous contrast. Multiplanar CT image reconstructions and MIPs were obtained to evaluate the vascular anatomy. Multidetector CT imaging of the abdomen and pelvis was performed using the standard protocol during bolus administration of intravenous contrast.  CONTRAST:  21mL OMNIPAQUE IOHEXOL 300 MG/ML  SOLN  COMPARISON:  CT Abdomen and Pelvis 08/02/2010.  Chest CT 08/15/2010.  FINDINGS: CTA CHEST FINDINGS  Good contrast bolus timing in the pulmonary arterial tree. No focal filling defect identified in the pulmonary arterial tree to suggest the presence of acute pulmonary embolism.  Stable lung volumes. Major airways are patent and stable. Chronic centrilobular emphysema. Stable right apical lung nodule measuring 6 mm, and partially calcified on series 5, image 17. New 6 mm posterior right upper lobe  nodule since 2012. Series 5, image 30. Small adjacent distal airway nodularity (tree-in-bud). Stable small right middle lobe subpleural nodules. Stable peripheral right lower lobe nodularity (images 66, 70, 77). No right pleural effusion.  Stable lateral left upper lobe nodularity (e.g. image 37). Mildly increased left medial basal lower lobe segment atelectasis. Stable occasional peripheral left lower lobe nodules.  No pericardial or pleural effusion. Ectatic thoracic aorta with chronic atherosclerosis. Patent proximal great vessels. Stable subcentimeter thyroid nodules. No axillary lymphadenopathy. No mediastinal or hilar lymphadenopathy. Sequelae of median sternotomy.  Osteopenia.   No acute osseous abnormality identified.  CT ABDOMEN and PELVIS FINDINGS  Mild L3 compression fracture is new since 2012. Loss of height 25%. No bony retropulsion. Other lumbar levels are stable. Osteopenia.  Foley catheter in place. Decompressed bladder. Oral contrast mixed with stool in the rectum. No pelvic free fluid. Negative for age uterus and  adnexa. Redundant sigmoid colon with diverticula. Mild wall thickening maybe artifact due to under distension. No definite mesenteric stranding.  Negative left: And splenic flexure. Redundant transverse colon, otherwise negative. Normal right colon and appendix. No dilated small bowel. Decompressed stomach. Duodenum within normal limits.  Stable liver with surgically absent gallbladder and prominent biliary tree. Portal venous system appears patent and within normal limits. Diminutive spleen. Pancreas and adrenal glands within normal limits. Kidneys within normal limits. Extensive Aortoiliac calcified atherosclerosis noted. Major arterial structures in the abdomen and pelvis are patent. No abdominal free fluid. No lymphadenopathy identified.  Review of the MIP images confirms the above findings.  IMPRESSION: 1.  No evidence of acute pulmonary embolus. 2. New 6 mm right upper lobe nodule  (Series 5, image 30). Followup chest CT at 6-12 months is recommended. This recommendation follows the consensus statement: Guidelines for Management of Small Pulmonary Nodules Detected on CT Scans: A Statement from the [place] as published in Radiology 2005;237:395-400. 3. Multiple other bilateral pulmonary nodules are stable since 2012, benign. 4. Stable, negative abdomen. Redundant sigmoid colon with diverticula, mild colitis here difficult to exclude. 5. Mild L3 compression fracture is new since 2012, could be subacute. If specific therapy such as vertebroplasty is desired, lumbar MRI or whole-body bone scan would best determine acuity.   Electronically Signed   By: Lars Pinks M.D.   On: 10/29/2013 12:04   Dg Chest Portable 1 View  10/28/2013   CLINICAL DATA:  Weakness, shortness of breath.  EXAM: PORTABLE CHEST - 1 VIEW  COMPARISON:  07/29/2013  FINDINGS: Prior CABG. Stable hyperinflation of the lungs compatible with COPD. Heart is normal size. Lungs are clear. No effusions. No acute bony abnormality.  IMPRESSION: Stable hyperinflation/COPD.  No active disease.   Electronically Signed   By: Rolm Baptise M.D.   On: 10/28/2013 16:58    Scheduled Meds: . heparin  5,000 Units Subcutaneous BID  . ipratropium-albuterol  3 mL Nebulization BID  . pantoprazole  40 mg Oral Daily  . sodium chloride  3 mL Intravenous Q12H   Continuous Infusions: . sodium chloride 125 mL/hr at 10/29/13 1630    Active Problems:   Weakness   Nausea   Failure to thrive in adult   Protein-calorie malnutrition, severe    Time spent: 12mins    MEMON,JEHANZEB  [facility] Pager [phone]. If 7PM-7AM, please contact night-coverage at [email or web address], password [facility] 10/29/2013, 8:19 PM  LOS: 1 day

## 2013-10-29 NOTE — Evaluation (Signed)
Physical Therapy Evaluation Patient Details Name: [name] MRN: [id number] DOB: [birth year] Today's Date: 10/29/2013   History of Present Illness  73 y.o. year old female with significant past medical history of multiple medical problems including end stage COPD, HTN, chronic fatigue, dementia, CHF, aortic stenosis s/p aortic valve replacement (porcine 2004), renal insufficiency presenting with weakness, nausea, failure to thrive. Primary historian is pt's son. Per son, pt has progressive decline in fucntion over past 2-3 weeks. Currently lives with sister who is fairly elderly. Pt usually ambulates to bedside commode, however, pt has had worsening functional status with inability to ambulate as well as decreased appetite over 2 weeks. Baseline COPD on 2L  Clinical Impression  Pt is a 73 year old female who presents to PT with dx of weakness.  Pt answers questions with appropriate responses, though does have some difficulty word finding (requesting to PT for nail clippers/file, though unable to tell PT name of object only held out hand and pointed to finger nails); will request ST consult, or recommend ST while at [facility] rehab.   During evaluation, pt required encouragement to participate as she was awaiting to go to xray per pt report.  Pt was able to complete bed mobility skills mod (I), transfers with use of RW and supervision, and gait of 10 feet with min guard and use of RW.  VC throughout for techniques (hand placement for transfers, and sequencing to get to EOB), though limited physical assist required.  Pt did require use of RW, which she reports she does not normally use secondary to decreased activity tolerance and weakness.  Recommend continued PT while in the hospital to address strengthening, activity tolerance, and balance for improved functional mobility skills with transition to SNF for continued rehab.  Will defer to SNF for DME, though recommend use of RW if pt is to return home.      Follow Up Recommendations SNF    Equipment Recommendations  Other (comment) (Will defer to SNF, recommend use of RW if pt is to go home)    Recommendations for Other Services Speech consult (Cognitive impairment, word finding issues during PT evaluation)     Precautions / Restrictions Precautions Precautions: None Restrictions Weight Bearing Restrictions: No      Mobility  Bed Mobility Overal bed mobility: Modified Independent                Transfers Overall transfer level: Needs assistance Equipment used: Rolling walker (2 wheeled) Transfers: Sit to/from Stand Sit to Stand: Supervision            Ambulation/Gait Ambulation/Gait assistance: Supervision;Min guard Ambulation Distance (Feet): 10 Feet Assistive device: Rolling walker (2 wheeled) Gait Pattern/deviations: Step-through pattern;Trunk flexed;Decreased stride length   Gait velocity interpretation: Below normal speed for age/gender       Balance Overall balance assessment: Needs assistance Sitting-balance support: Feet unsupported;Single extremity supported Sitting balance-Leahy Scale: Fair     Standing balance support: Bilateral upper extremity supported;During functional activity Standing balance-Leahy Scale: Fair                               Pertinent Vitals/Pain Pain Assessment: No/denies pain    Home Living Family/patient expects to be discharged to:: Skilled nursing facility Living Arrangements: Other relatives (Sister)   Type of Home: House Home Access: Stairs to enter   [employer] of Steps: 3 steps Home Layout: One level Home Equipment: Shower seat;Cane - single  point;Walker - 2 wheels;Bedside commode;Grab bars - tub/shower;Grab bars - toilet      Prior Function Level of Independence: Independent               Hand Dominance   Dominant Hand: Right    Extremity/Trunk Assessment   Upper Extremity Assessment: Defer to OT evaluation            Lower Extremity Assessment: Generalized weakness         Communication   Communication: No difficulties  Cognition Arousal/Alertness: Awake/alert Behavior During Therapy: WFL for tasks assessed/performed Overall Cognitive Status: History of cognitive impairments - at baseline                        Assessment/Plan    PT Assessment Patient needs continued PT services  PT Diagnosis Difficulty walking;Generalized weakness   PT Problem List Decreased strength;Decreased activity tolerance;Decreased mobility  PT Treatment Interventions Balance training;Gait training;Neuromuscular re-education;Functional mobility training;Therapeutic activities;Therapeutic exercise;Stair training   PT Goals (Current goals can be found in the Care Plan section) Acute Rehab PT Goals Patient Stated Goal: none stated PT Goal Formulation: With patient Time For Goal Achievement: 11/12/13 Potential to Achieve Goals: Good    Frequency Min 3X/week    End of Session Equipment Utilized During Treatment: Gait belt;Oxygen Activity Tolerance: Patient limited by fatigue Patient left: in bed;with call bell/phone within reach;with bed alarm set           Time: 3893-7342 PT Time Calculation (min): 21 min   Charges:   PT Evaluation $Initial PT Evaluation Tier I: 1 Procedure     Desirai Traxler 10/29/2013, 10:23 AM

## 2013-10-29 NOTE — Evaluation (Signed)
Occupational Therapy Evaluation Patient Details Name: [name] MRN: [id number] DOB: 11/15/1940 Today's Date: 10/29/2013    History of Present Illness 73 y.o. year old female with significant past medical history of multiple medical problems including end stage COPD, HTN, chronic fatigue, dementia, CHF, aortic stenosis s/p aortic valve replacement (porcine 2004), renal insufficiency presenting with weakness, nausea, failure to thrive. Primary historian is pt's son. Per son, pt has progressive decline in fucntion over past 2-3 weeks. Currently lives with sister who is fairly elderly. Pt usually ambulates to bedside commode, however, pt has had worsening functional status with inability to ambulate as well as decreased appetite over 2 weeks. Baseline COPD on 2L   Clinical Impression   Patient sleeping upon therapy arrival. Patient agreeable to participate in OT eval with some encouragement. Patient with history of cognitive deficits states that sister lives with her and they care for each other. Patient states that she does what she wants to do and she did not take her medication due to her believing that she did not need it. 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 Pt will decreased BUE strength. MMT: 3/5 in all ranges.  Pt reports that her daughter in law owns a SNF in [place] and she would be agreeable to go for therapy at discharge. Recommend D/C to SNF.    Follow Up Recommendations  SNF    Equipment Recommendations  None recommended by OT       Precautions / Restrictions Precautions Precautions: Fall      Mobility Bed Mobility Overal bed mobility: Independent                          ADL Overall ADL's : At baseline                                       General ADL Comments: LBD: Independent seated on edge of bed. Patient refused to transfer or stand at end of bed.                Pertinent Vitals/Pain Pain Assessment:  No/denies pain     Hand Dominance Right   Extremity/Trunk Assessment Upper Extremity Assessment Upper Extremity Assessment: Generalized weakness   Lower Extremity Assessment Lower Extremity Assessment: Defer to PT evaluation       Communication Communication Communication: No difficulties   Cognition Arousal/Alertness: Awake/alert Behavior During Therapy: Flat affect Overall Cognitive Status: History of cognitive impairments - at baseline                                Home Living Family/patient expects to be discharged to:: Private residence     Type of Home: House Home Access: Stairs to enter [employer] of Steps: 3 steps   Home Layout: One level     Bathroom Shower/Tub: Walk-in shower         Home Equipment: Shower seat;Cane - single point;Walker - 2 wheels;Bedside commode;Grab bars - tub/shower;Grab bars - toilet          Prior Functioning/Environment Level of Independence: Independent             OT Diagnosis: Generalized weakness   OT Problem List: Decreased strength;Decreased activity tolerance  Barriers to D/C:  None             End of Session    Activity Tolerance: Patient tolerated treatment well Patient left: in bed;with call bell/phone within reach;with bed alarm set   Time: 0211-1735 OT Time Calculation (min): 17 min Charges:  OT General Charges $OT Visit: 1 Procedure OT Evaluation $Initial OT Evaluation Tier I: 1 Procedure G-Codes:    Ailene Ravel, OTR/L,CBIS   10/29/2013, 8:45 AM

## 2013-10-29 NOTE — Progress Notes (Signed)
*  PRELIMINARY RESULTS* Echocardiogram 2D Echocardiogram has been performed.  Leavy Cella 10/29/2013, 2:44 PM

## 2013-10-29 NOTE — Clinical Social Work Psychosocial (Signed)
Clinical Social Work Department BRIEF PSYCHOSOCIAL ASSESSMENT 10/29/2013  Patient:  [name], [name]     Account Number:  [id number]     Admit date:  10/28/2013  Clinical Social Worker:  Wyatt Haste  Date/Time:  10/29/2013 03:46 PM  Referred by:  CSW  Date Referred:  10/29/2013 Referred for  SNF Placement   Other Referral:   Interview type:  Patient Other interview type:    PSYCHOSOCIAL DATA Living Status:  SIBLING Admitted from facility:   Level of care:   Primary support name:  Robin Primary support relationship to patient:  SIBLING Degree of support available:   supportive family, but pt states she does things on her own    CURRENT CONCERNS Current Concerns  Post-Acute Placement   Other Concerns:    SOCIAL WORK ASSESSMENT / PLAN CSW met with pt at bedside. Pt alert and oriented and reports she lives with her sister. Pt came to ED due to weakness. She states her family is all there for each other, but that she cares for herself. Pt has children who she said live close enough that they can be there if needed. She describes difficulty recently with bathing and ambulating. Pt is afraid she is going to fall. At baseline, she ambulates without assistive device inside the home, but uses a cane when outside. Pt explains that she and her sister are "not as good at taking care of ourselves as we used to be." She appeared to be having a difficult time with this. CSW provided brief support, but pt was not wanting to continue conversation. She said neighbors bring in meals some to help them out. CSW discussed PT recommendation of SNF with pt. She states her daughter-in-law runs a "home" in [place] and she and her sister have considered going to stay there together. Pt is not sure of name of facility. CSW offered to look at SNF first for more therapy. Pt states she is going home from hospital and will work it out to go to the "home" on her own. She did not indicates she wanted to continue  discussion further. CSW requested to call pt's family to share discussion, but she states this is "not necessary."   Assessment/plan status:  Referral to [employer] Other assessment/ plan:   Information/referral to community resources:   CM for home health/equipment    PATIENT'S/FAMILY'S RESPONSE TO PLAN OF CARE: Pt is not open to going to SNF from hospital and is working on own plan to go to family member's "home" that she runs in [place]. CSW will sign off, but can be reconsulted if needed. CM aware of home health needs.       Benay Pike, [place]
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
## 2013-10-30 ENCOUNTER — Inpatient Hospital Stay (HOSPITAL_COMMUNITY): Payer: Medicare Other

## 2013-10-30 DIAGNOSIS — J449 Chronic obstructive pulmonary disease, unspecified: Secondary | ICD-10-CM

## 2013-10-30 LAB — CBC WITH DIFFERENTIAL/PLATELET
Basophils Absolute: 0 10*3/uL (ref 0.0–0.1)
Basophils Relative: 0 % (ref 0–1)
Eosinophils Absolute: 0.1 10*3/uL (ref 0.0–0.7)
Eosinophils Relative: 1 % (ref 0–5)
HCT: 31.8 % — ABNORMAL LOW (ref 36.0–46.0)
Hemoglobin: 10.2 g/dL — ABNORMAL LOW (ref 12.0–15.0)
LYMPHS ABS: 1.7 10*3/uL (ref 0.7–4.0)
LYMPHS PCT: 22 % (ref 12–46)
MCH: 29.8 pg (ref 26.0–34.0)
MCHC: 32.1 g/dL (ref 30.0–36.0)
MCV: 93 fL (ref 78.0–100.0)
Monocytes Absolute: 0.6 10*3/uL (ref 0.1–1.0)
Monocytes Relative: 7 % (ref 3–12)
NEUTROS ABS: 5.4 10*3/uL (ref 1.7–7.7)
Neutrophils Relative %: 70 % (ref 43–77)
PLATELETS: 346 10*3/uL (ref 150–400)
RBC: 3.42 MIL/uL — AB (ref 3.87–5.11)
RDW: 15.4 % (ref 11.5–15.5)
WBC: 7.8 10*3/uL (ref 4.0–10.5)

## 2013-10-30 LAB — COMPREHENSIVE METABOLIC PANEL
ALT: 14 U/L (ref 0–35)
ANION GAP: 9 (ref 5–15)
AST: 25 U/L (ref 0–37)
Albumin: 2.3 g/dL — ABNORMAL LOW (ref 3.5–5.2)
Alkaline Phosphatase: 48 U/L (ref 39–117)
BILIRUBIN TOTAL: 0.4 mg/dL (ref 0.3–1.2)
BUN: 15 mg/dL (ref 6–23)
CALCIUM: 8.5 mg/dL (ref 8.4–10.5)
CHLORIDE: 102 meq/L (ref 96–112)
CO2: 31 mEq/L (ref 19–32)
CREATININE: 0.64 mg/dL (ref 0.50–1.10)
GFR calc Af Amer: >90 mL/min (ref >90)
GFR calc non Af Amer: 88 mL/min — ABNORMAL LOW (ref >90)
Glucose, Bld: 59 mg/dL — ABNORMAL LOW (ref 70–99)
Potassium: 3.1 mEq/L — ABNORMAL LOW (ref 3.7–5.3)
Sodium: 142 mEq/L (ref 137–147)
Total Protein: 4.9 g/dL — ABNORMAL LOW (ref 6.0–8.3)

## 2013-10-30 LAB — VITAMIN D 25 HYDROXY (VIT D DEFICIENCY, FRACTURES): Vit D, 25-Hydroxy: 47 ng/mL (ref 30–89)

## 2013-10-30 LAB — URINE CULTURE: Colony Count: 2000

## 2013-10-30 LAB — LIPID PANEL
CHOL/HDL RATIO: 2.1 ratio
CHOLESTEROL: 197 mg/dL (ref 0–200)
HDL: 92 mg/dL (ref >39)
LDL Cholesterol: 79 mg/dL (ref 0–99)
Triglycerides: 128 mg/dL (ref <150)
VLDL: 26 mg/dL (ref 0–40)

## 2013-10-30 MED ORDER — POTASSIUM CHLORIDE CRYS ER 20 MEQ PO TBCR
40.0000 meq | EXTENDED_RELEASE_TABLET | ORAL | Status: AC
  Administered 2013-10-30 – 2013-10-31 (×3): 40 meq via ORAL
  Filled 2013-10-30 (×3): qty 2

## 2013-10-30 MED ORDER — THIAMINE HCL 100 MG/ML IJ SOLN
100.0000 mg | Freq: Every day | INTRAMUSCULAR | Status: DC
  Administered 2013-10-30 – 2013-11-01 (×3): 100 mg via INTRAVENOUS
  Filled 2013-10-30 (×3): qty 2

## 2013-10-30 MED ORDER — ASPIRIN 325 MG PO TABS
325.0000 mg | ORAL_TABLET | Freq: Every day | ORAL | Status: DC
  Administered 2013-10-30 – 2013-11-01 (×3): 325 mg via ORAL
  Filled 2013-10-30 (×3): qty 1

## 2013-10-30 NOTE — Clinical Documentation Improvement (Signed)
PLEASE SPECIFY TYPE AND ACUITY OF CHF: Possible Clinical Conditions?   Chronic Systolic Congestive Heart Failure Chronic Diastolic Congestive Heart Failure Chronic Systolic & Diastolic Congestive Heart Failure Acute Systolic Congestive Heart Failure Acute Diastolic Congestive Heart Failure Acute Systolic & Diastolic Congestive Heart Failure Acute on Chronic Systolic Congestive Heart Failure Acute on Chronic Diastolic Congestive Heart Failure Acute on Chronic Systolic & Diastolic Congestive Heart Failure Other Condition Cannot Clinically Determine  Supporting Information: (As per notes) " Pt has CHF"  Thank You, Alessandra Grout, RN, BSN, CCDS,Clinical Documentation Specialist:  [phone]  [phone]=[facility] [place]- [facility] Information Management

## 2013-10-30 NOTE — Progress Notes (Signed)
Pt refuses PT this AM.  She c/o general malaise and inability to work with PT.  I was unsuccessful in encouraging her otherwise.  Will try again later today if time allows.

## 2013-10-30 NOTE — Consult Note (Signed)
[place] A. Merlene Laughter, MD     [email or web address]          [name] is an 73 y.o. female.   ASSESSMENT/PLAN:  1. 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 Risk factors include hypertension and age. The small infarct likely does not represent a failure of aspirin and she has been noncompliant with her medications including aspirin. I would suggest restarting aspirin. 81 mg or 325 could be used but I would suggest a aspirin that is not coated as the coated aspirin has been found not to be observed as well. 2. Generalized weakness and gait impairment. This is most likely multifactorial including malnutrition and dehydration. The patient will be started empirically on thiamine. Vitamin B12 will also be checked. Physical therapy is recommended.   This is a 73 year old white female who presents with about 4 week history of global weakness of unclear etiology. The chart says 2 weeks but in talking to the patient she tells me that it has been going on for at least 4 weeks. She does not report specific focal weakness. She presented to the hospital with these complaints and also having significant nausea. She denies headaches, focal numbness, dysarthria, dysphagia or chest pain. She appears to have had some dyspnea. 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 The review of systems otherwise negative.  GENERAL: This is a pleasant undernourished appearing female in no acute distress.  HEENT: Supple. Atraumatic normocephalic.   ABDOMEN: soft  EXTREMITIES: No edema   BACK: Normal.  SKIN: Normal by inspection.    MENTAL STATUS: She is awake and alert. She is lucid. She knows the season the hospital at [facility]. Speech and language are generally unremarkable. She followed commands well.  CRANIAL NERVES: Pupils are equal, round and reactive to light and accommodation; extra ocular movements are full, there is no significant nystagmus; visual fields are full;  upper and lower facial muscles are normal in strength and symmetric, there is no flattening of the nasolabial folds; tongue is midline; uvula is midline; shoulder elevation is normal.  MOTOR: She has 4+/5 strength in the upper and lower extremities both proximally and distally. Bulk and tone are normal.  COORDINATION: Left finger to nose is normal, right finger to nose is normal, No rest tremor; no intention tremor; no postural tremor; no bradykinesia.  REFLEXES: Deep tendon reflexes are symmetrical and normal. Babinski reflexes are flexor bilaterally.   SENSATION: Normal to light touch.    Past Medical History  Diagnosis Date  . Rheumatic fever   . Renal insufficiency   . Aortic stenosis   . Hyperlipidemia   . Hypertension   . COPD with chronic bronchitis   . Osteoporosis   . Sebaceous cyst   . Urosepsis 2012    required several hospitalizations for this  . Thyroid disease 2012    hyperthyroid, had abl;ation therapy treated by endo  . Anxiety   . Dementia     Past Surgical History  Procedure Laterality Date  . 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 Appendectomy    . Cholecystectomy    . Lumbar laminectomy    . Tonsillectomy    . Colon surgery  2005  . Breast lumpectomy    . Esophagogastroduodenoscopy  10/12/09    short segment Barrett's esophage/small hiatal hernia,antral ersions  . Colonoscopy  07/23/2008     Single diminutive polyp in the rectum status post cold biopsy removal/ Status post right  hemicolectomy/ Colonic ulcers in the upstream end the colon distal to the  anastomosis as described above.  Status post biopsy. The anastomosis itself and neoterminal ileum appeared normal  . Cardiac valve replacement      Family History  Problem Relation Age of Onset  . Heart attack Mother 87  . Stroke Father 73  . Hypertension Sister     2 sisters    Social History:  reports that she quit smoking about 18 months ago. Her smoking use included Cigarettes.  She has a 25 pack-year smoking history. She does not have any smokeless tobacco history on file. She reports that she does not drink alcohol or use illicit drugs.  Allergies:  Allergies  Allergen Reactions  . Levofloxacin Anaphylaxis  . Nitrofurantoin Shortness Of Breath    Acute dyspnea  . Bupropion Hcl Other (See Comments)    unknown  . Cyclobenzaprine Hcl Other (See Comments)    unknown  . Gadolinium      Desc: PT REFUSES MR & CT CONTRAST DUE TO PRIOR REACTION, PT STATES HAIR FELL OUT & SKIN CHANGED COLOR   . Lorazepam     Causes mental confusion/breakdown  . Penicillins Other (See Comments)    Child hood allergy  . Statins Other (See Comments)    Joint stiffness  . Sulfonamide Derivatives Other (See Comments)    unknown    Medications: Prior to Admission medications   Medication Sig Start Date End Date Taking? Authorizing Provider  aspirin EC 81 MG tablet Take 81 mg by mouth every evening.   Yes Historical Provider, MD  Calcium Carbonate-Vit D-Min (QC CALCIUM/MINERALS/VITAMIN D) 600-400 MG-UNIT TABS Take 1 tablet by mouth every evening.    Yes Historical Provider, MD  Choline Fenofibrate (TRILIPIX) 135 MG capsule Take 135 mg by mouth at bedtime.   Yes Historical Provider, MD  donepezil (ARICEPT) 10 MG tablet Take 10 mg by mouth at bedtime.   Yes Historical Provider, MD  fluticasone (FLONASE) 50 MCG/ACT nasal spray Place 2 sprays into the nose daily as needed. Sinus/congestion   Yes Historical Provider, MD  Ipratropium-Albuterol (COMBIVENT) 20-100 MCG/ACT AERS respimat Inhale 1 puff into the lungs every 6 (six) hours. 07/22/13  Yes Fayrene Helper, MD  lansoprazole (PREVACID) 30 MG capsule Take 30 mg by mouth daily at 12 noon.   Yes Historical Provider, MD  loratadine (CLARITIN) 10 MG tablet Take 10 mg by mouth every morning.    Yes Historical Provider, MD  losartan (COZAAR) 25 MG tablet Take 12.5 mg by mouth daily as needed. If blood pressure levels are normal Patient does  not take medication   Yes Historical Provider, MD  mirtazapine (REMERON) 7.5 MG tablet Take 7.5 mg by mouth at bedtime as needed (sleep).   Yes Historical Provider, MD  Multiple Vitamins-Minerals (MULTIVITAMINS THER. W/MINERALS) TABS tablet Take 1 tablet by mouth daily.   Yes Historical Provider, MD  Omega-3 Fatty Acids (FISH OIL CONCENTRATE) 1000 MG CAPS Take 1 capsule by mouth 2 (two) times daily.    Yes Historical Provider, MD  propranolol (INDERAL) 20 MG tablet Take 20 mg by mouth daily.   Yes Historical Provider, MD  tiotropium (SPIRIVA) 18 MCG inhalation capsule Place 18 mcg into inhaler and inhale daily.   Yes Historical Provider, MD  vitamin C (ASCORBIC ACID) 500 MG tablet Take 500 mg by mouth daily.   Yes Historical Provider, MD  albuterol (PROVENTIL HFA) 108 (90 BASE) MCG/ACT inhaler Inhale 2 puffs into the lungs every 6 (  six) hours as needed. Shortness of breath    Historical Provider, MD    Scheduled Meds: . heparin  5,000 Units Subcutaneous BID  . ipratropium-albuterol  3 mL Nebulization BID  . pantoprazole  40 mg Oral Daily  . propranolol  20 mg Oral BID  . sodium chloride  3 mL Intravenous Q12H   Continuous Infusions: . sodium chloride 50 mL/hr at 10/29/13 2154   PRN Meds:.albuterol, zolpidem   Blood pressure 115/59, pulse 73, temperature 98.1 F (36.7 C), temperature source Oral, resp. rate 18, height _0  (1.549 m), weight 39.3 kg (86 lb 10.3 oz), SpO2 100.00%.   Results for orders placed during the hospital encounter of 10/28/13 (from the past 48 hour(s))  URINALYSIS, ROUTINE W REFLEX MICROSCOPIC     Status: Abnormal   Collection Time    10/28/13  6:11 PM      Result Value Ref Range   Color, Urine YELLOW  YELLOW   APPearance CLEAR  CLEAR   Specific Gravity, Urine 1.020  1.005 - 1.030   pH 6.0  5.0 - 8.0   Glucose, UA NEGATIVE  NEGATIVE mg/dL   Hgb urine dipstick NEGATIVE  NEGATIVE   Bilirubin Urine MODERATE (*) NEGATIVE   Ketones, ur 15 (*) NEGATIVE mg/dL    Protein, ur NEGATIVE  NEGATIVE mg/dL   Urobilinogen, UA 2.0 (*) 0.0 - 1.0 mg/dL   Nitrite NEGATIVE  NEGATIVE   Leukocytes, UA NEGATIVE  NEGATIVE   Comment: MICROSCOPIC NOT DONE ON URINES WITH NEGATIVE PROTEIN, BLOOD, LEUKOCYTES, NITRITE, OR GLUCOSE <1000 mg/dL.  PRO B NATRIURETIC PEPTIDE     Status: Abnormal   Collection Time    10/28/13  8:24 PM      Result Value Ref Range   Pro B Natriuretic peptide (BNP) 669.3 (*) 0 - 125 pg/mL  TROPONIN I     Status: None   Collection Time    10/28/13  8:24 PM      Result Value Ref Range   Troponin I <0.30  <0.30 ng/mL   Comment:            Due to the release kinetics of cTnI,     a negative result within the first hours     of the onset of symptoms does not rule out     myocardial infarction with certainty.     If myocardial infarction is still suspected,     repeat the test at appropriate intervals.  HEMOGLOBIN A1C     Status: None   Collection Time    10/28/13  8:24 PM      Result Value Ref Range   Hemoglobin A1C 5.3  <5.7 %   Comment: (NOTE)                                                                               According to the ADA Clinical Practice Recommendations for 2011, when     HbA1c is used as a screening test:      >=6.5%   Diagnostic of Diabetes Mellitus               (if abnormal result is confirmed)     5.7-6.4%  Increased risk of developing Diabetes Mellitus     References:Diagnosis and Classification of Diabetes Mellitus,Diabetes     WYOV,7858,85(OYDXA 1):S62-S69 and Standards of Medical Care in             Diabetes - 2011,Diabetes JOIN,8676,72 (Suppl 1):S11-S61.   Mean Plasma Glucose 105  <117 mg/dL   Comment: Performed at [facility]     Status: None   Collection Time    10/28/13  8:24 PM      Result Value Ref Range   Phosphorus 3.5  2.3 - 4.6 mg/dL  MAGNESIUM     Status: None   Collection Time    10/28/13  8:24 PM      Result Value Ref Range   Magnesium 1.6  1.5 - 2.5 mg/dL    D-DIMER, QUANTITATIVE     Status: Abnormal   Collection Time    10/28/13  8:24 PM      Result Value Ref Range   D-Dimer, Quant 2.50 (*) 0.00 - 0.48 ug/mL-FEU   Comment:            AT THE INHOUSE ESTABLISHED CUTOFF     VALUE OF 0.48 ug/mL FEU,     THIS ASSAY HAS BEEN DOCUMENTED     IN THE LITERATURE TO HAVE     A SENSITIVITY AND NEGATIVE     PREDICTIVE VALUE OF AT LEAST     98 TO 99%.  THE TEST RESULT     SHOULD BE CORRELATED WITH     AN ASSESSMENT OF THE CLINICAL     PROBABILITY OF DVT / VTE.  COMPREHENSIVE METABOLIC PANEL     Status: Abnormal   Collection Time    10/29/13  2:23 AM      Result Value Ref Range   Sodium 145  137 - 147 mEq/L   Potassium 3.8  3.7 - 5.3 mEq/L   Chloride 100  96 - 112 mEq/L   CO2 32  19 - 32 mEq/L   Glucose, Bld 128 (*) 70 - 99 mg/dL   BUN 19  6 - 23 mg/dL   Creatinine, Ser 0.67  0.50 - 1.10 mg/dL   Calcium 9.1  8.4 - 10.5 mg/dL   Total Protein 5.6 (*) 6.0 - 8.3 g/dL   Albumin 2.6 (*) 3.5 - 5.2 g/dL   AST 27  0 - 37 U/L   ALT 17  0 - 35 U/L   Alkaline Phosphatase 55  39 - 117 U/L   Total Bilirubin 0.4  0.3 - 1.2 mg/dL   GFR calc non Af Amer 86 (*) >90 mL/min   GFR calc Af Amer >90  >90 mL/min   Comment: (NOTE)     The eGFR has been calculated using the CKD EPI equation.     This calculation has not been validated in all clinical situations.     eGFR's persistently <90 mL/min signify possible Chronic Kidney     Disease.   Anion gap 13  5 - 15  CBC WITH DIFFERENTIAL     Status: Abnormal   Collection Time    10/29/13  2:23 AM      Result Value Ref Range   WBC 5.3  4.0 - 10.5 K/uL   RBC 3.76 (*) 3.87 - 5.11 MIL/uL   Hemoglobin 11.2 (*) 12.0 - 15.0 g/dL   Comment: DELTA CHECK NOTED   HCT 35.5 (*) 36.0 - 46.0 %   MCV 94.4  78.0 - 100.0 fL  MCH 29.8  26.0 - 34.0 pg   MCHC 31.5  30.0 - 36.0 g/dL   RDW 15.0  11.5 - 15.5 %   Platelets 356  150 - 400 K/uL   Neutrophils Relative % 89 (*) 43 - 77 %   Neutro Abs 4.8  1.7 - 7.7 K/uL    Lymphocytes Relative 11 (*) 12 - 46 %   Lymphs Abs 0.5 (*) 0.7 - 4.0 K/uL   Monocytes Relative 0 (*) 3 - 12 %   Monocytes Absolute 0.0 (*) 0.1 - 1.0 K/uL   Eosinophils Relative 0  0 - 5 %   Eosinophils Absolute 0.0  0.0 - 0.7 K/uL   Basophils Relative 0  0 - 1 %   Basophils Absolute 0.0  0.0 - 0.1 K/uL  TROPONIN I     Status: None   Collection Time    10/29/13  2:23 AM      Result Value Ref Range   Troponin I <0.30  <0.30 ng/mL   Comment:            Due to the release kinetics of cTnI,     a negative result within the first hours     of the onset of symptoms does not rule out     myocardial infarction with certainty.     If myocardial infarction is still suspected,     repeat the test at appropriate intervals.  VITAMIN D 25 HYDROXY     Status: None   Collection Time    10/29/13  2:23 AM      Result Value Ref Range   Vit D, 25-Hydroxy 47  30 - 89 ng/mL   Comment: (NOTE)     This assay accurately quantifies Vitamin D, which is the sum of the     25-Hydroxy forms of Vitamin D2 and D3.  Studies have shown that the     optimum concentration of 25-Hydroxy Vitamin D is 30 ng/mL or higher.      Concentrations of Vitamin D between 20 and 29 ng/mL are considered to     be insufficient and concentrations less than 20 ng/mL are considered     to be deficient for Vitamin D.     Performed at [facility]  TROPONIN I     Status: None   Collection Time    10/29/13  7:13 AM      Result Value Ref Range   Troponin I <0.30  <0.30 ng/mL   Comment:            Due to the release kinetics of cTnI,     a negative result within the first hours     of the onset of symptoms does not rule out     myocardial infarction with certainty.     If myocardial infarction is still suspected,     repeat the test at appropriate intervals.  PREALBUMIN     Status: Abnormal   Collection Time    10/29/13  7:13 AM      Result Value Ref Range   Prealbumin 13.0 (*) 17.0 - 34.0 mg/dL   Comment: Performed at  [facility], BLOOD (ROUTINE X 2)     Status: None   Collection Time    10/29/13  7:13 AM      Result Value Ref Range   Specimen Description BLOOD LEFT ARM     Special Requests BOTTLES DRAWN AEROBIC AND ANAEROBIC 6CC  Culture NO GROWTH 1 DAY     Report Status PENDING    CULTURE, BLOOD (ROUTINE X 2)     Status: None   Collection Time    10/29/13  7:13 AM      Result Value Ref Range   Specimen Description BLOOD LEFT ARM     Special Requests BOTTLES DRAWN AEROBIC ONLY 6CC     Culture NO GROWTH 1 DAY     Report Status PENDING    OCCULT BLOOD X 1 CARD TO LAB, STOOL     Status: Abnormal   Collection Time    10/29/13 10:48 AM      Result Value Ref Range   Fecal Occult Bld POSITIVE (*) NEGATIVE  URINE CULTURE     Status: None   Collection Time    10/29/13 11:10 AM      Result Value Ref Range   Specimen Description URINE, CLEAN CATCH     Special Requests NONE     Culture  Setup Time       Value: 10/29/2013 11:25     Performed at [facility] Count       Value: 2,000 COLONIES/ML     Performed at [facility]   Culture       Value: INSIGNIFICANT GROWTH     Performed at [facility]   Report Status 10/30/2013 FINAL    COMPREHENSIVE METABOLIC PANEL     Status: Abnormal   Collection Time    10/30/13  5:43 AM      Result Value Ref Range   Sodium 142  137 - 147 mEq/L   Potassium 3.1 (*) 3.7 - 5.3 mEq/L   Comment: DELTA CHECK NOTED   Chloride 102  96 - 112 mEq/L   CO2 31  19 - 32 mEq/L   Glucose, Bld 59 (*) 70 - 99 mg/dL   BUN 15  6 - 23 mg/dL   Creatinine, Ser 0.64  0.50 - 1.10 mg/dL   Calcium 8.5  8.4 - 10.5 mg/dL   Total Protein 4.9 (*) 6.0 - 8.3 g/dL   Albumin 2.3 (*) 3.5 - 5.2 g/dL   AST 25  0 - 37 U/L   ALT 14  0 - 35 U/L   Alkaline Phosphatase 48  39 - 117 U/L   Total Bilirubin 0.4  0.3 - 1.2 mg/dL   GFR calc non Af Amer 88 (*) >90 mL/min   GFR calc Af Amer >90  >90 mL/min   Comment: (NOTE)     The eGFR has been  calculated using the CKD EPI equation.     This calculation has not been validated in all clinical situations.     eGFR's persistently <90 mL/min signify possible Chronic Kidney     Disease.   Anion gap 9  5 - 15  CBC WITH DIFFERENTIAL     Status: Abnormal   Collection Time    10/30/13  5:43 AM      Result Value Ref Range   WBC 7.8  4.0 - 10.5 K/uL   RBC 3.42 (*) 3.87 - 5.11 MIL/uL   Hemoglobin 10.2 (*) 12.0 - 15.0 g/dL   HCT 31.8 (*) 36.0 - 46.0 %   MCV 93.0  78.0 - 100.0 fL   MCH 29.8  26.0 - 34.0 pg   MCHC 32.1  30.0 - 36.0 g/dL   RDW 15.4  11.5 - 15.5 %   Platelets 346  150 - 400  K/uL   Neutrophils Relative % 70  43 - 77 %   Neutro Abs 5.4  1.7 - 7.7 K/uL   Lymphocytes Relative 22  12 - 46 %   Lymphs Abs 1.7  0.7 - 4.0 K/uL   Monocytes Relative 7  3 - 12 %   Monocytes Absolute 0.6  0.1 - 1.0 K/uL   Eosinophils Relative 1  0 - 5 %   Eosinophils Absolute 0.1  0.0 - 0.7 K/uL   Basophils Relative 0  0 - 1 %   Basophils Absolute 0.0  0.0 - 0.1 K/uL  LIPID PANEL     Status: None   Collection Time    10/30/13  5:43 AM      Result Value Ref Range   Cholesterol 197  0 - 200 mg/dL   Triglycerides 158  <309 mg/dL   HDL 92  >40 mg/dL   Total CHOL/HDL Ratio 2.1     VLDL 26  0 - 40 mg/dL   LDL Cholesterol 79  0 - 99 mg/dL   Comment:            Total Cholesterol/HDL:CHD Risk     Coronary Heart Disease Risk Table                         Men   Women      1/2 Average Risk   3.4   3.3      Average Risk       5.0   4.4      2 X Average Risk   9.6   7.1      3 X Average Risk  23.4   11.0                Use the calculated Patient Ratio     above and the CHD Risk Table     to determine the patient's CHD Risk.                ATP III CLASSIFICATION (LDL):      <100     mg/dL   Optimal      768-088  mg/dL   Near or Above                        Optimal      130-159  mg/dL   Borderline      110-315  mg/dL   High      >945     mg/dL   Very High    Studies/Results:  CAROTID Mild  to moderate carotid atherosclerosis. No hemodynamically  significant ICA stenosis by ultrasound.   BRAIN MRI MRI HEAD WITHOUT CONTRAST  TECHNIQUE:  Multiplanar, multiecho pulse sequences of the brain and surrounding  structures were obtained without intravenous contrast.  COMPARISON: MRI 07/30/2012, CT 07/28/2012  FINDINGS:  Image quality is degraded by motion particularly late in the study.  Multiple attempts were made but the patient was confused and not  able to hold still.  Small area of acute infarct right cerebellum measuring 5 x 10 mm. No  other acute infarct identified  Chronic microvascular ischemic changes in the white matter. Small  chronic infarct right parietal lobe posteriorly. Brainstem appears  normal. Small chronic infarct left and right cerebellum.  Negative for hemorrhage or mass. Negative for hydrocephalus.  IMPRESSION:  Small acute infarct right cerebellum.    ECHO - Left ventricle: The cavity size  was normal. Wall thickness was increased in a pattern of mild LVH. Systolic function was vigorous. The estimated ejection fraction was in the range of 65% to 70%. Doppler parameters are consistent with abnormal left ventricular relaxation (grade 1 diastolic dysfunction). Doppler parameters are consistent with elevated ventricular end-diastolic filling pressure. - Aortic valve: A bioprosthesis was present - 21 mm porcine valve based on records. There was no significant regurgitation. Overall poorly visualized, no gradients are reported. There is no obvious perivalvular leak. - Mitral valve: Calcified annulus. There was trivial regurgitation. - Right atrium: Central venous pressure (est): 3 mm Hg. - Tricuspid valve: There was trivial regurgitation. - Pulmonary arteries: Systolic pressure could not be accurately estimated. - Pericardium, extracardiac: There was no pericardial effusion.  Impressions:  - Overall limited images. There is mild LVH with LVEF  approximately 37-62%, grade 1 diastolic dysfunction with increased filling pressures. Bioprosthetic aortic valve is poorly visualized, no obvious aortic regurgitation or perivalvular leak, no gradients reported. Trivial tricuspid regurgitation, unable to assess PASP.     Juanita Streight A. Merlene Laughter, M.D.  Diplomate, Tax adviser of Psychiatry and Neurology ( Neurology). 10/30/2013, 6:01 PM

## 2013-10-30 NOTE — Progress Notes (Signed)
[facility] PROGRESS NOTE  [name] EXB:[id number] DOB: 1940/10/07 DOA: 10/28/2013 PCP: Tula Nakayama, MD  Assessment/Plan: 1. 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 Patient reportedly does not take aspirin regularly as an outpatient. MRI shows acute infarct in the right cerebellum. Carotid dopplers and 2D echo are unremarkable. Hemoglobin A1c was found to be in normal range. Lipid panel shows LDL of 79. Consult neurology for further recommendations. 2. Generalized weakness/nausea/vomiting. Likely multifactorial related to decreased by mouth intake, deconditioning, possibly #1. Physical therapy has recommended skilled nursing facility placement. 3. COPD and chronic respiratory failure. Patient is chronically on 2 L of oxygen. She did have some mild wheezing on admission and received one dose of Solu-Medrol. Continue bronchodilators. 4. Hypertension. Stable 5. Aortic stenosis. Does not report any chest pain. Echocardiogram has ordered 6. Stool occult blood positive. She may need to have this further evaluated with colonoscopy as an outpatient. Her son reports that she had a colonoscopy done several years ago. No reported gross evidence of bleeding.  Patient is unsure as to the color of her stools, since she does not look at them. Hemoglobin appears to be stable.  Start on protonix. 7. Severe protein calorie malnutrition. Nutrition consult 8. RUL lung nodule.  Will need surveillance CT as an outpatient  Code Status: full code Family Communication: discussed with daughter at the bedside Disposition Plan: will likely need SNF placement   Consultants:    Procedures: Echo: - Overall limited images. There is mild LVH with LVEF approximately 10-27%, grade 1 diastolic dysfunction with increased filling pressures. Bioprosthetic aortic valve is poorly visualized, no obvious aortic regurgitation or perivalvular leak, no gradients reported. Trivial tricuspid regurgitation, unable to  assess PASP    Antibiotics:    HPI/Subjective: Nausea and vomiting improving. Still feels weak.  Objective: Filed Vitals:   10/30/13 1431  BP: 115/59  Pulse: 73  Temp: 98.1 F (36.7 C)  Resp: 18    Intake/Output Summary (Last 24 hours) at 10/30/13 1930 Last data filed at 10/30/13 1809  Gross per 24 hour  Intake    510 ml  Output    280 ml  Net    230 ml   Filed Weights   10/28/13 2130 10/29/13 0500 10/30/13 0447  Weight: 37.7 kg (83 lb 1.8 oz) 38.6 kg (85 lb 1.6 oz) 39.3 kg (86 lb 10.3 oz)    Exam:   General:  NAD  Cardiovascular: S1, S2 tachycardic  Respiratory: CTA B  Abdomen: soft, nt, nd, bs+  Musculoskeletal:  trace edema b/l   Data Reviewed: Basic Metabolic Panel:  Recent Labs Lab 10/28/13 1650 10/28/13 2024 10/29/13 0223 10/30/13 0543  NA 143  --  145 142  K 3.3*  --  3.8 3.1*  CL 95*  --  100 102  CO2 33*  --  32 31  GLUCOSE 143*  --  128* 59*  BUN 19  --  19 15  CREATININE 0.62  --  0.67 0.64  CALCIUM 10.0  --  9.1 8.5  MG  --  1.6  --   --   PHOS  --  3.5  --   --    Liver Function Tests:  Recent Labs Lab 10/28/13 1650 10/29/13 0223 10/30/13 0543  AST 36 27 25  ALT 22 17 14   ALKPHOS 68 55 48  BILITOT 0.5 0.4 0.4  PROT 6.8 5.6* 4.9*  ALBUMIN 3.2* 2.6* 2.3*    Recent Labs Lab 10/28/13 1650  LIPASE 51  No results found for this basename: AMMONIA,  in the last 168 hours CBC:  Recent Labs Lab 10/28/13 1650 10/29/13 0223 10/30/13 0543  WBC 6.0 5.3 7.8  NEUTROABS 3.8 4.8 5.4  HGB 13.5 11.2* 10.2*  HCT 41.9 35.5* 31.8*  MCV 92.9 94.4 93.0  PLT 419* 356 346   Cardiac Enzymes:  Recent Labs Lab 10/28/13 2024 10/29/13 0223 10/29/13 0713  TROPONINI <0.30 <0.30 <0.30   BNP (last 3 results)  Recent Labs  07/29/13 1544 10/28/13 2024  PROBNP 352.5* 669.3*   CBG: No results found for this basename: GLUCAP,  in the last 168 hours  Recent Results (from the past 240 hour(s))  CULTURE, BLOOD (ROUTINE X 2)      Status: None   Collection Time    10/29/13  7:13 AM      Result Value Ref Range Status   Specimen Description BLOOD LEFT ARM   Final   Special Requests BOTTLES DRAWN AEROBIC AND ANAEROBIC 6CC   Final   Culture NO GROWTH 1 DAY   Final   Report Status PENDING   Incomplete  CULTURE, BLOOD (ROUTINE X 2)     Status: None   Collection Time    10/29/13  7:13 AM      Result Value Ref Range Status   Specimen Description BLOOD LEFT ARM   Final   Special Requests BOTTLES DRAWN AEROBIC ONLY 6CC   Final   Culture NO GROWTH 1 DAY   Final   Report Status PENDING   Incomplete  URINE CULTURE     Status: None   Collection Time    10/29/13 11:10 AM      Result Value Ref Range Status   Specimen Description URINE, CLEAN CATCH   Final   Special Requests NONE   Final   Culture  Setup Time     Final   Value: 10/29/2013 11:25     Performed at [facility]     Final   Value: 2,000 COLONIES/ML     Performed at [facility]   Culture     Final   Value: INSIGNIFICANT GROWTH     Performed at [facility]   Report Status 10/30/2013 FINAL   Final     Studies: Ct Angio Chest Pe W/cm &/or Wo Cm  10/29/2013   CLINICAL DATA:  73 year old female with pain. Weakness, nausea. Initial encounter. Chronic obstructed pulmonary disease. History of surgically treated aortic stenosis.  EXAM: CT ANGIOGRAPHY CHEST  CT ABDOMEN AND PELVIS WITH CONTRAST  TECHNIQUE: Multidetector CT imaging of the chest was performed using the standard protocol during bolus administration of intravenous contrast. Multiplanar CT image reconstructions and MIPs were obtained to evaluate the vascular anatomy. Multidetector CT imaging of the abdomen and pelvis was performed using the standard protocol during bolus administration of intravenous contrast.  CONTRAST:  57mL OMNIPAQUE IOHEXOL 300 MG/ML  SOLN  COMPARISON:  CT Abdomen and Pelvis 08/02/2010.  Chest CT 08/15/2010.  FINDINGS: CTA CHEST FINDINGS  Good  contrast bolus timing in the pulmonary arterial tree. No focal filling defect identified in the pulmonary arterial tree to suggest the presence of acute pulmonary embolism.  Stable lung volumes. Major airways are patent and stable. Chronic centrilobular emphysema. Stable right apical lung nodule measuring 6 mm, and partially calcified on series 5, image 17. New 6 mm posterior right upper lobe nodule since 2012. Series 5, image 30. Small adjacent distal airway nodularity (tree-in-bud). Stable small right  middle lobe subpleural nodules. Stable peripheral right lower lobe nodularity (images 66, 70, 77). No right pleural effusion.  Stable lateral left upper lobe nodularity (e.g. image 37). Mildly increased left medial basal lower lobe segment atelectasis. Stable occasional peripheral left lower lobe nodules.  No pericardial or pleural effusion. Ectatic thoracic aorta with chronic atherosclerosis. Patent proximal great vessels. Stable subcentimeter thyroid nodules. No axillary lymphadenopathy. No mediastinal or hilar lymphadenopathy. Sequelae of median sternotomy.  Osteopenia.   No acute osseous abnormality identified.  CT ABDOMEN and PELVIS FINDINGS  Mild L3 compression fracture is new since 2012. Loss of height 25%. No bony retropulsion. Other lumbar levels are stable. Osteopenia.  Foley catheter in place. Decompressed bladder. Oral contrast mixed with stool in the rectum. No pelvic free fluid. Negative for age uterus and adnexa. Redundant sigmoid colon with diverticula. Mild wall thickening maybe artifact due to under distension. No definite mesenteric stranding.  Negative left: And splenic flexure. Redundant transverse colon, otherwise negative. Normal right colon and appendix. No dilated small bowel. Decompressed stomach. Duodenum within normal limits.  Stable liver with surgically absent gallbladder and prominent biliary tree. Portal venous system appears patent and within normal limits. Diminutive spleen. Pancreas  and adrenal glands within normal limits. Kidneys within normal limits. Extensive Aortoiliac calcified atherosclerosis noted. Major arterial structures in the abdomen and pelvis are patent. No abdominal free fluid. No lymphadenopathy identified.  Review of the MIP images confirms the above findings.  IMPRESSION: 1.  No evidence of acute pulmonary embolus. 2. New 6 mm right upper lobe nodule (Series 5, image 30). Followup chest CT at 6-12 months is recommended. This recommendation follows the consensus statement: Guidelines for Management of Small Pulmonary Nodules Detected on CT Scans: A Statement from the [place] as published in Radiology 2005;237:395-400. 3. Multiple other bilateral pulmonary nodules are stable since 2012, benign. 4. Stable, negative abdomen. Redundant sigmoid colon with diverticula, mild colitis here difficult to exclude. 5. Mild L3 compression fracture is new since 2012, could be subacute. If specific therapy such as vertebroplasty is desired, lumbar MRI or whole-body bone scan would best determine acuity.   Electronically Signed   By: Lars Pinks M.D.   On: 10/29/2013 12:04   Mr Brain Wo Contrast  10/29/2013   CLINICAL DATA:  Weakness.  EXAM: MRI HEAD WITHOUT CONTRAST  TECHNIQUE: Multiplanar, multiecho pulse sequences of the brain and surrounding structures were obtained without intravenous contrast.  COMPARISON:  MRI 07/30/2012, CT 07/28/2012  FINDINGS: Image quality is degraded by motion particularly late in the study. Multiple attempts were made but the patient was confused and not able to hold still.  Small area of acute infarct right cerebellum measuring 5 x 10 mm. No other acute infarct identified  Chronic microvascular ischemic changes in the white matter. Small chronic infarct right parietal lobe posteriorly. Brainstem appears normal. Small chronic infarct left and right cerebellum.  Negative for hemorrhage or mass.  Negative for hydrocephalus.  IMPRESSION: Small acute infarct  right cerebellum.   Electronically Signed   By: Franchot Gallo M.D.   On: 10/29/2013 12:42   Ct Abdomen Pelvis W Contrast  10/29/2013   CLINICAL DATA:  74 year old female with pain. Weakness, nausea. Initial encounter. Chronic obstructed pulmonary disease. History of surgically treated aortic stenosis.  EXAM: CT ANGIOGRAPHY CHEST  CT ABDOMEN AND PELVIS WITH CONTRAST  TECHNIQUE: Multidetector CT imaging of the chest was performed using the standard protocol during bolus administration of intravenous contrast. Multiplanar CT image reconstructions and MIPs were obtained  to evaluate the vascular anatomy. Multidetector CT imaging of the abdomen and pelvis was performed using the standard protocol during bolus administration of intravenous contrast.  CONTRAST:  49mL OMNIPAQUE IOHEXOL 300 MG/ML  SOLN  COMPARISON:  CT Abdomen and Pelvis 08/02/2010.  Chest CT 08/15/2010.  FINDINGS: CTA CHEST FINDINGS  Good contrast bolus timing in the pulmonary arterial tree. No focal filling defect identified in the pulmonary arterial tree to suggest the presence of acute pulmonary embolism.  Stable lung volumes. Major airways are patent and stable. Chronic centrilobular emphysema. Stable right apical lung nodule measuring 6 mm, and partially calcified on series 5, image 17. New 6 mm posterior right upper lobe nodule since 2012. Series 5, image 30. Small adjacent distal airway nodularity (tree-in-bud). Stable small right middle lobe subpleural nodules. Stable peripheral right lower lobe nodularity (images 66, 70, 77). No right pleural effusion.  Stable lateral left upper lobe nodularity (e.g. image 37). Mildly increased left medial basal lower lobe segment atelectasis. Stable occasional peripheral left lower lobe nodules.  No pericardial or pleural effusion. Ectatic thoracic aorta with chronic atherosclerosis. Patent proximal great vessels. Stable subcentimeter thyroid nodules. No axillary lymphadenopathy. No mediastinal or hilar  lymphadenopathy. Sequelae of median sternotomy.  Osteopenia.   No acute osseous abnormality identified.  CT ABDOMEN and PELVIS FINDINGS  Mild L3 compression fracture is new since 2012. Loss of height 25%. No bony retropulsion. Other lumbar levels are stable. Osteopenia.  Foley catheter in place. Decompressed bladder. Oral contrast mixed with stool in the rectum. No pelvic free fluid. Negative for age uterus and adnexa. Redundant sigmoid colon with diverticula. Mild wall thickening maybe artifact due to under distension. No definite mesenteric stranding.  Negative left: And splenic flexure. Redundant transverse colon, otherwise negative. Normal right colon and appendix. No dilated small bowel. Decompressed stomach. Duodenum within normal limits.  Stable liver with surgically absent gallbladder and prominent biliary tree. Portal venous system appears patent and within normal limits. Diminutive spleen. Pancreas and adrenal glands within normal limits. Kidneys within normal limits. Extensive Aortoiliac calcified atherosclerosis noted. Major arterial structures in the abdomen and pelvis are patent. No abdominal free fluid. No lymphadenopathy identified.  Review of the MIP images confirms the above findings.  IMPRESSION: 1.  No evidence of acute pulmonary embolus. 2. New 6 mm right upper lobe nodule (Series 5, image 30). Followup chest CT at 6-12 months is recommended. This recommendation follows the consensus statement: Guidelines for Management of Small Pulmonary Nodules Detected on CT Scans: A Statement from the [place] as published in Radiology 2005;237:395-400. 3. Multiple other bilateral pulmonary nodules are stable since 2012, benign. 4. Stable, negative abdomen. Redundant sigmoid colon with diverticula, mild colitis here difficult to exclude. 5. Mild L3 compression fracture is new since 2012, could be subacute. If specific therapy such as vertebroplasty is desired, lumbar MRI or whole-body bone scan  would best determine acuity.   Electronically Signed   By: Lars Pinks M.D.   On: 10/29/2013 12:04   US Carotid Bilateral  10/30/2013   CLINICAL DATA:  Hypertension, stroke, hyperlipidemia  EXAM: BILATERAL CAROTID DUPLEX ULTRASOUND  TECHNIQUE: Pearline Cables scale imaging, color Doppler and duplex ultrasound were performed of bilateral carotid and vertebral arteries in the neck.  COMPARISON:  None.  FINDINGS: Criteria: Quantification of carotid stenosis is based on velocity parameters that correlate the residual internal carotid diameter with NASCET-based stenosis levels, using the diameter of the distal internal carotid lumen as the denominator for stenosis measurement.  The following velocity measurements  were obtained:  RIGHT  ICA:  77/22 cm/sec  CCA:  10/1 cm/sec  SYSTOLIC ICA/CCA RATIO:  7.51  DIASTOLIC ICA/CCA RATIO:  0.25  ECA:  161 cm/sec  LEFT  ICA:  73/16 cm/sec  CCA:  85/2 cm/sec  SYSTOLIC ICA/CCA RATIO:  2.1  DIASTOLIC ICA/CCA RATIO:  2.2  ECA:  86 cm/sec  RIGHT CAROTID ARTERY: Minor echogenic shadowing plaque formation. No hemodynamically significant right ICA stenosis, velocity elevation, or turbulent flow. Degree of narrowing less than 50%.  RIGHT VERTEBRAL ARTERY:  Antegrade  LEFT CAROTID ARTERY: Similar scattered minor echogenic plaque formation. No hemodynamically significant left ICA stenosis, velocity elevation, or turbulent flow.  LEFT VERTEBRAL ARTERY:  Antegrade  IMPRESSION: Mild to moderate carotid atherosclerosis. No hemodynamically significant ICA stenosis by ultrasound.   Electronically Signed   By: Daryll Brod M.D.   On: 10/30/2013 13:35    Scheduled Meds: . aspirin  325 mg Oral Daily  . heparin  5,000 Units Subcutaneous BID  . ipratropium-albuterol  3 mL Nebulization BID  . pantoprazole  40 mg Oral Daily  . propranolol  20 mg Oral BID  . sodium chloride  3 mL Intravenous Q12H  . thiamine IV  100 mg Intravenous Daily   Continuous Infusions: . sodium chloride 50 mL/hr at 10/29/13  2154    Active Problems:   AORTIC VALVE DISEASE   CHRONIC OBSTRUCTIVE PULMONARY DISEASE   Dehydration   Weakness   Nausea   Failure to thrive in adult   Protein-calorie malnutrition, severe   Cerebellar stroke    Time spent: 32mins    Elizabeth Paulsen  [facility] Pager [phone]. If 7PM-7AM, please contact night-coverage at [email or web address], password [facility] 10/30/2013, 7:30 PM  LOS: 2 days

## 2013-10-31 LAB — CLOSTRIDIUM DIFFICILE BY PCR: Toxigenic C. Difficile by PCR: NEGATIVE

## 2013-10-31 LAB — COMPREHENSIVE METABOLIC PANEL
ALK PHOS: 56 U/L (ref 39–117)
ALT: 17 U/L (ref 0–35)
AST: 27 U/L (ref 0–37)
Albumin: 2.5 g/dL — ABNORMAL LOW (ref 3.5–5.2)
Anion gap: 10 (ref 5–15)
BUN: 10 mg/dL (ref 6–23)
CO2: 29 mEq/L (ref 19–32)
Calcium: 8.9 mg/dL (ref 8.4–10.5)
Chloride: 106 mEq/L (ref 96–112)
Creatinine, Ser: 0.66 mg/dL (ref 0.50–1.10)
GFR calc non Af Amer: 87 mL/min — ABNORMAL LOW (ref >90)
GLUCOSE: 65 mg/dL — AB (ref 70–99)
Potassium: 4.8 mEq/L (ref 3.7–5.3)
Sodium: 145 mEq/L (ref 137–147)
TOTAL PROTEIN: 5.4 g/dL — AB (ref 6.0–8.3)
Total Bilirubin: 0.4 mg/dL (ref 0.3–1.2)

## 2013-10-31 LAB — HOMOCYSTEINE: HOMOCYSTEINE-NORM: 17.1 umol/L — AB (ref 4.0–15.4)

## 2013-10-31 LAB — CBC WITH DIFFERENTIAL/PLATELET
BASOS ABS: 0 10*3/uL (ref 0.0–0.1)
BASOS PCT: 0 % (ref 0–1)
Eosinophils Absolute: 0.2 10*3/uL (ref 0.0–0.7)
Eosinophils Relative: 2 % (ref 0–5)
HCT: 36.6 % (ref 36.0–46.0)
Hemoglobin: 11.9 g/dL — ABNORMAL LOW (ref 12.0–15.0)
Lymphocytes Relative: 18 % (ref 12–46)
Lymphs Abs: 1.6 10*3/uL (ref 0.7–4.0)
MCH: 30.2 pg (ref 26.0–34.0)
MCHC: 32.5 g/dL (ref 30.0–36.0)
MCV: 92.9 fL (ref 78.0–100.0)
MONOS PCT: 6 % (ref 3–12)
Monocytes Absolute: 0.5 10*3/uL (ref 0.1–1.0)
NEUTROS ABS: 6.3 10*3/uL (ref 1.7–7.7)
NEUTROS PCT: 74 % (ref 43–77)
Platelets: 361 10*3/uL (ref 150–400)
RBC: 3.94 MIL/uL (ref 3.87–5.11)
RDW: 15.4 % (ref 11.5–15.5)
WBC: 8.6 10*3/uL (ref 4.0–10.5)

## 2013-10-31 LAB — GLUCOSE, CAPILLARY
GLUCOSE-CAPILLARY: 69 mg/dL — AB (ref 70–99)
GLUCOSE-CAPILLARY: 114 mg/dL — AB (ref 70–99)
Glucose-Capillary: 84 mg/dL (ref 70–99)
Glucose-Capillary: 182 mg/dL — ABNORMAL HIGH (ref 70–99)
Glucose-Capillary: 150 mg/dL — ABNORMAL HIGH (ref 70–99)

## 2013-10-31 LAB — MAGNESIUM: Magnesium: 1.4 mg/dL — ABNORMAL LOW (ref 1.5–2.5)

## 2013-10-31 LAB — VITAMIN B12: Vitamin B-12: 365 pg/mL (ref 211–911)

## 2013-10-31 MED ORDER — STROKE: EARLY STAGES OF RECOVERY BOOK
Freq: Once | Status: AC
  Administered 2013-10-31: 11:00:00
  Filled 2013-10-31: qty 1

## 2013-10-31 MED ORDER — ENSURE COMPLETE PO LIQD
237.0000 mL | Freq: Two times a day (BID) | ORAL | Status: DC
  Administered 2013-10-31 – 2013-11-01 (×2): 237 mL via ORAL

## 2013-10-31 MED ORDER — MAGNESIUM SULFATE 4000MG/100ML IJ SOLN
4.0000 g | Freq: Once | INTRAMUSCULAR | Status: AC
  Administered 2013-10-31: 4 g via INTRAVENOUS
  Filled 2013-10-31: qty 100

## 2013-10-31 NOTE — Clinical Social Work Placement (Signed)
Clinical Social Work Department CLINICAL SOCIAL WORK PLACEMENT NOTE 10/31/2013  Patient:  RASHONDA, WARRIOR  Account Number:  [id number] Admit date:  10/28/2013  Clinical Social Worker:  Benay Pike, LCSW  Date/time:  10/31/2013 10:55 AM  Clinical Social Work is seeking post-discharge placement for this patient at the following level of care:   [place]   (*CSW will update this form in Epic as items are completed)   10/31/2013  Patient/family provided with [facility] Department of Clinical Social Work's list of facilities offering this level of care within the geographic area requested by the patient (or if unable, by the patient's family).  10/31/2013  Patient/family informed of their freedom to choose among providers that offer the needed level of care, that participate in Medicare, Medicaid or managed care program needed by the patient, have an available bed and are willing to accept the patient.  10/31/2013  Patient/family informed of [facility]' ownership interest in [facility], as well as of the fact that they are under no obligation to receive care at this facility.  PASARR submitted to EDS on  PASARR number received on   FL2 transmitted to all facilities in geographic area requested by pt/family on  10/31/2013 FL2 transmitted to all facilities within larger geographic area on   Patient informed that his/her managed care company has contracts with or will negotiate with  certain facilities, including the following:     Patient/family informed of bed offers received:  10/31/2013 Patient chooses bed at [place] Physician recommends and patient chooses bed at  [place]  Patient to be transferred to  on   Patient to be transferred to facility by  Patient and family notified of transfer on  Name of family member notified:    The following physician request were entered in Epic:   Additional Comments: Pt has existing pasarr.  Benay Pike, [place]

## 2013-10-31 NOTE — Progress Notes (Signed)
[facility] PROGRESS NOTE  [name] OJJ:[id number] DOB: 06-12-1940 DOA: 10/28/2013 PCP: Tula Nakayama, MD  Assessment/Plan: 1. 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 Patient reportedly does not take aspirin regularly as an outpatient. MRI shows acute infarct in the right cerebellum. Carotid dopplers and 2D echo are unremarkable. Hemoglobin A1c was found to be in normal range. Lipid panel shows LDL of 79. Neurology input appreciated. Recommendations are for daily aspirin for secondary prevention. 2. Generalized weakness/nausea/vomiting. Likely multifactorial related to decreased by mouth intake, deconditioning, possibly #1. Physical therapy has recommended skilled nursing facility placement. 3. COPD and chronic respiratory failure. Patient is chronically on 2 L of oxygen. She did have some mild wheezing on admission and received one dose of Solu-Medrol. Continue bronchodilators. Respiratory status appears to be at baseline 4. Hypertension. Stable 5. Aortic stenosis. Does not report any chest pain.  6. Stool occult blood positive. She may need to have this further evaluated with colonoscopy as an outpatient. Her son reports that she had a colonoscopy done several years ago. No reported gross evidence of bleeding.  Patient is unsure as to the color of her stools, since she does not look at them. Hemoglobin appears to be stable.  Started on protonix. 7. Severe protein calorie malnutrition. Appreciate nutrition input 8. RUL lung nodule.  Will need surveillance CT as an outpatient  Code Status: full code Family Communication: discussed with daughter at the bedside Disposition Plan: will likely need SNF placement   Consultants:  Neurology  Procedures: Echo: - Overall limited images. There is mild LVH with LVEF approximately 93-71%, grade 1 diastolic dysfunction with increased filling pressures. Bioprosthetic aortic valve is poorly visualized, no obvious aortic regurgitation or  perivalvular leak, no gradients reported. Trivial tricuspid regurgitation, unable to assess PASP    Antibiotics:    HPI/Subjective: No new complaints.  Feels breathing is at baseline.  Reports brown colored stools.  Objective: Filed Vitals:   10/31/13 1336  BP: 121/36  Pulse: 65  Temp: 97.7 F (36.5 C)  Resp: 20    Intake/Output Summary (Last 24 hours) at 10/31/13 1902 Last data filed at 10/31/13 0900  Gross per 24 hour  Intake    240 ml  Output      0 ml  Net    240 ml   Filed Weights   10/29/13 0500 10/30/13 0447 10/31/13 0500  Weight: 38.6 kg (85 lb 1.6 oz) 39.3 kg (86 lb 10.3 oz) 40.1 kg (88 lb 6.5 oz)    Exam:   General:  NAD  Cardiovascular: S1, S2 RRR  Respiratory: CTA B  Abdomen: soft, nt, nd, bs+  Musculoskeletal:  trace edema b/l   Data Reviewed: Basic Metabolic Panel:  Recent Labs Lab 10/28/13 1650 10/28/13 2024 10/29/13 0223 10/30/13 0543 10/31/13 0500  NA 143  --  145 142 145  K 3.3*  --  3.8 3.1* 4.8  CL 95*  --  100 102 106  CO2 33*  --  32 31 29  GLUCOSE 143*  --  128* 59* 65*  BUN 19  --  19 15 10   CREATININE 0.62  --  0.67 0.64 0.66  CALCIUM 10.0  --  9.1 8.5 8.9  MG  --  1.6  --   --  1.4*  PHOS  --  3.5  --   --   --    Liver Function Tests:  Recent Labs Lab 10/28/13 1650 10/29/13 0223 10/30/13 0543 10/31/13 0500  AST 36 27 25  27  ALT 22 17 14 17   ALKPHOS 68 55 48 56  BILITOT 0.5 0.4 0.4 0.4  PROT 6.8 5.6* 4.9* 5.4*  ALBUMIN 3.2* 2.6* 2.3* 2.5*    Recent Labs Lab 10/28/13 1650  LIPASE 51   No results found for this basename: AMMONIA,  in the last 168 hours CBC:  Recent Labs Lab 10/28/13 1650 10/29/13 0223 10/30/13 0543 10/31/13 0500  WBC 6.0 5.3 7.8 8.6  NEUTROABS 3.8 4.8 5.4 6.3  HGB 13.5 11.2* 10.2* 11.9*  HCT 41.9 35.5* 31.8* 36.6  MCV 92.9 94.4 93.0 92.9  PLT 419* 356 346 361   Cardiac Enzymes:  Recent Labs Lab 10/28/13 2024 10/29/13 0223 10/29/13 0713  TROPONINI <0.30 <0.30 <0.30    BNP (last 3 results)  Recent Labs  07/29/13 1544 10/28/13 2024  PROBNP 352.5* 669.3*   CBG:  Recent Labs Lab 10/31/13 0840 10/31/13 0854 10/31/13 1128 10/31/13 1652  GLUCAP 69* 84 182* 114*    Recent Results (from the past 240 hour(s))  CULTURE, BLOOD (ROUTINE X 2)     Status: None   Collection Time    10/29/13  7:13 AM      Result Value Ref Range Status   Specimen Description BLOOD LEFT ARM   Final   Special Requests BOTTLES DRAWN AEROBIC AND ANAEROBIC 6CC   Final   Culture NO GROWTH 1 DAY   Final   Report Status PENDING   Incomplete  CULTURE, BLOOD (ROUTINE X 2)     Status: None   Collection Time    10/29/13  7:13 AM      Result Value Ref Range Status   Specimen Description BLOOD LEFT ARM   Final   Special Requests BOTTLES DRAWN AEROBIC ONLY 6CC   Final   Culture NO GROWTH 1 DAY   Final   Report Status PENDING   Incomplete  URINE CULTURE     Status: None   Collection Time    10/29/13 11:10 AM      Result Value Ref Range Status   Specimen Description URINE, CLEAN CATCH   Final   Special Requests NONE   Final   Culture  Setup Time     Final   Value: 10/29/2013 11:25     Performed at [facility]     Final   Value: 2,000 COLONIES/ML     Performed at [facility]   Culture     Final   Value: INSIGNIFICANT GROWTH     Performed at [facility]   Report Status 10/30/2013 FINAL   Final  CLOSTRIDIUM DIFFICILE BY PCR     Status: None   Collection Time    10/31/13  8:57 AM      Result Value Ref Range Status   C difficile by pcr NEGATIVE  NEGATIVE Final     Studies: US Carotid Bilateral  10/30/2013   CLINICAL DATA:  Hypertension, stroke, hyperlipidemia  EXAM: BILATERAL CAROTID DUPLEX ULTRASOUND  TECHNIQUE: Pearline Cables scale imaging, color Doppler and duplex ultrasound were performed of bilateral carotid and vertebral arteries in the neck.  COMPARISON:  None.  FINDINGS: Criteria: Quantification of carotid stenosis is based on  velocity parameters that correlate the residual internal carotid diameter with NASCET-based stenosis levels, using the diameter of the distal internal carotid lumen as the denominator for stenosis measurement.  The following velocity measurements were obtained:  RIGHT  ICA:  77/22 cm/sec  CCA:  27/7 cm/sec  SYSTOLIC ICA/CCA  RATIO:  4.54  DIASTOLIC ICA/CCA RATIO:  0.98  ECA:  161 cm/sec  LEFT  ICA:  73/16 cm/sec  CCA:  11/9 cm/sec  SYSTOLIC ICA/CCA RATIO:  2.1  DIASTOLIC ICA/CCA RATIO:  2.2  ECA:  86 cm/sec  RIGHT CAROTID ARTERY: Minor echogenic shadowing plaque formation. No hemodynamically significant right ICA stenosis, velocity elevation, or turbulent flow. Degree of narrowing less than 50%.  RIGHT VERTEBRAL ARTERY:  Antegrade  LEFT CAROTID ARTERY: Similar scattered minor echogenic plaque formation. No hemodynamically significant left ICA stenosis, velocity elevation, or turbulent flow.  LEFT VERTEBRAL ARTERY:  Antegrade  IMPRESSION: Mild to moderate carotid atherosclerosis. No hemodynamically significant ICA stenosis by ultrasound.   Electronically Signed   By: Daryll Brod M.D.   On: 10/30/2013 13:35   Dg Chest Port 1 View  10/30/2013   CLINICAL DATA:  Shortness of breath, weakness, nausea, vomiting, stroke, hypertension, renal insufficiency, COPD, dementia  EXAM: PORTABLE CHEST - 1 VIEW  COMPARISON:  Portable exam 1947 hr compared to 10/28/2013  FINDINGS: Normal heart size post median sternotomy, by history post AVR.  Atherosclerotic calcification aorta.  Mediastinal contours and pulmonary vascularity normal.  Emphysematous changes with mild streaky atelectasis in medial LEFT lower lobe.  No pulmonary infiltrate, pleural effusion or pneumothorax.  Bones demineralized.  IMPRESSION: COPD changes with minimal streaky atelectasis in LEFT lower lobe.  No acute abnormalities.   Electronically Signed   By: Lavonia Dana M.D.   On: 10/30/2013 20:04    Scheduled Meds: . aspirin  325 mg Oral Daily  . feeding  supplement (ENSURE COMPLETE)  237 mL Oral BID BM  . heparin  5,000 Units Subcutaneous BID  . ipratropium-albuterol  3 mL Nebulization BID  . pantoprazole  40 mg Oral Daily  . propranolol  20 mg Oral BID  . sodium chloride  3 mL Intravenous Q12H  . thiamine IV  100 mg Intravenous Daily   Continuous Infusions:    Active Problems:   AORTIC VALVE DISEASE   CHRONIC OBSTRUCTIVE PULMONARY DISEASE   Dehydration   Weakness   Nausea   Failure to thrive in adult   Protein-calorie malnutrition, severe   Cerebellar stroke    Time spent: 92mins    Alette Kataoka  [facility] Pager [phone]. If 7PM-7AM, please contact night-coverage at [email or web address], password [facility] 10/31/2013, 7:02 PM  LOS: 3 days

## 2013-10-31 NOTE — Progress Notes (Signed)
Hypoglycemic Event  CBG: 69  Treatment: 15 GM carbohydrate snack  Symptoms: None  Follow-up CBG: Time: 0858 CBG Result: 84  Possible Reasons for Event: Unknown  Comments/MD notified: Blood sugar corrected with 15G Carbs     [name] H  Remember to initiate Hypoglycemia Order Set & complete

## 2013-10-31 NOTE — Progress Notes (Signed)
Pt is just now beginning to eat lunch, declines any PT today.

## 2013-10-31 NOTE — Progress Notes (Signed)
NUTRITION FOLLOW UP  Intervention:   Ensure Complete po BID, each supplement provides 350 kcal and 13 grams of protein  Nutrition Dx:   Inadequate oral intake related to failure to thrive as evidenced by pt hx (has not eaten anything in 1 week) and limited food choices over past 3 weeks per pt, increased weakness, nausea on admission.  Goal:   Pt to meet >/= 90% of their estimated nutrition needs; goal not met   Monitor:   Percent po intake (meals and supplements), labs and wt trends   Assessment:   Pt from home. Her hx includes dementia, end-stage COPD and CHF. Progressive decline and failure to thrive in adult. Decreased appetite and increased weakness prior to admission. She is currently NPO and has just completed her [place] prep.  Pt was evaluated by PT and recommendation is for SNF placement. PER CSW, pt is refusing and is requesting to be placed in a group home run by a family member.  Noted 3# wt gain since admission.  Meal intake is variable (ranging from 0-100%), averaging around 50%. Diet has been advanced to Heart Healthy, will order supplement for additional support.  Labs reviewed. Mg low (1.4); receiving supplement. Glucose down (65). CBGs 69-84.   Height: Ht Readings from Last 1 Encounters:  10/28/13 _0  (1.549 m)    Weight Status:   Wt Readings from Last 1 Encounters:  10/31/13 88 lb 6.5 oz (40.1 kg)  Admit wt:  10/29/13  85 lb 1.6 oz (38.6 kg)   Re-estimated needs:  Kcal: 0938-1829 Protein: 52-62 grams Fluid: 1.2-1.4 L  Skin: stage I pressure ulcer on upper mid sacrum, abscess on upper back  Diet Order: Cardiac   Intake/Output Summary (Last 24 hours) at 10/31/13 1048 Last data filed at 10/31/13 0900  Gross per 24 hour  Intake    460 ml  Output    200 ml  Net    260 ml    Last BM: 10/31/13   Labs:   Recent Labs Lab 10/28/13 1650 10/28/13 2024 10/29/13 0223 10/30/13 0543 10/31/13 0500  NA 143  --  145 142 145  K 3.3*  --  3.8 3.1* 4.8   CL 95*  --  100 102 106  CO2 33*  --  32 31 29  BUN 19  --  _1 CREATININE 0.62  --  0.67 0.64 0.66  CALCIUM 10.0  --  9.1 8.5 8.9  MG  --  1.6  --   --  1.4*  PHOS  --  3.5  --   --   --   GLUCOSE 143*  --  128* 59* 65*    CBG (last 3)   Recent Labs  10/31/13 0840 10/31/13 0854  GLUCAP 69* 84    Scheduled Meds: . aspirin  325 mg Oral Daily  . heparin  5,000 Units Subcutaneous BID  . ipratropium-albuterol  3 mL Nebulization BID  . magnesium sulfate 1 - 4 g bolus IVPB  4 g Intravenous Once  . pantoprazole  40 mg Oral Daily  . propranolol  20 mg Oral BID  . sodium chloride  3 mL Intravenous Q12H  . thiamine IV  100 mg Intravenous Daily    Continuous Infusions:   Jinger Middlesworth A. Jimmye Norman, RD, LDN Pager: [phone]

## 2013-10-31 NOTE — Clinical Social Work Note (Signed)
Pt and son aware of bed offer at [facility]. Pt did not want to discuss at this time, but family will continue to talk to her about it. Facility notified of possible d/c tomorrow.   Benay Pike, [place]

## 2013-10-31 NOTE — Clinical Social Work Note (Signed)
Pt's son, Linna Hoff present in room. Pt sleeping. Discussed that pt had refused placement two days ago. Linna Hoff states that he has discussed it further with pt and convinced her that this is best option. MD feels SNF is most appropriate for rehab as opposed to ALF where daughter-in-law works. Linna Hoff also agrees. Pt has been to [facility] in past and they request to return there if possible. CSW will initiate bed search and follow up with pt for her input.   Benay Pike, [place]

## 2013-10-31 NOTE — Clinical Social Work Placement (Signed)
Clinical Social Work Department CLINICAL SOCIAL WORK PLACEMENT NOTE 10/31/2013  Patient:  [name], [name]  Account Number:  [id number] Admit date:  10/28/2013  Clinical Social Worker:  Benay Pike, LCSW  Date/time:  10/31/2013 10:55 AM  Clinical Social Work is seeking post-discharge placement for this patient at the following level of care:   [place]   (*CSW will update this form in Epic as items are completed)   10/31/2013  Patient/family provided with [place] Department of Clinical Social Work's list of facilities offering this level of care within the geographic area requested by the patient (or if unable, by the patient's family).  10/31/2013  Patient/family informed of their freedom to choose among providers that offer the needed level of care, that participate in Medicare, Medicaid or managed care program needed by the patient, have an available bed and are willing to accept the patient.  10/31/2013  Patient/family informed of [facility]' ownership interest in [facility], as well as of the fact that they are under no obligation to receive care at this facility.  PASARR submitted to EDS on  PASARR number received on   FL2 transmitted to all facilities in geographic area requested by pt/family on  10/31/2013 FL2 transmitted to all facilities within larger geographic area on   Patient informed that his/her managed care company has contracts with or will negotiate with  certain facilities, including the following:     Patient/family informed of bed offers received:   Patient chooses bed at  Physician recommends and patient chooses bed at    Patient to be transferred to  on   Patient to be transferred to facility by  Patient and family notified of transfer on  Name of family member notified:    The following physician request were entered in Epic:   Additional Comments: Pt has existing pasarr.  Benay Pike, [place]

## 2013-11-01 DIAGNOSIS — R5383 Other fatigue: Secondary | ICD-10-CM | POA: Diagnosis not present

## 2013-11-01 DIAGNOSIS — G309 Alzheimer's disease, unspecified: Secondary | ICD-10-CM | POA: Diagnosis not present

## 2013-11-01 DIAGNOSIS — J42 Unspecified chronic bronchitis: Secondary | ICD-10-CM | POA: Diagnosis not present

## 2013-11-01 DIAGNOSIS — R4182 Altered mental status, unspecified: Secondary | ICD-10-CM | POA: Diagnosis not present

## 2013-11-01 DIAGNOSIS — I00 Rheumatic fever without heart involvement: Secondary | ICD-10-CM | POA: Diagnosis not present

## 2013-11-01 DIAGNOSIS — R488 Other symbolic dysfunctions: Secondary | ICD-10-CM | POA: Diagnosis not present

## 2013-11-01 DIAGNOSIS — J9610 Chronic respiratory failure, unspecified whether with hypoxia or hypercapnia: Secondary | ICD-10-CM | POA: Diagnosis not present

## 2013-11-01 DIAGNOSIS — F0280 Dementia in other diseases classified elsewhere without behavioral disturbance: Secondary | ICD-10-CM | POA: Diagnosis not present

## 2013-11-01 DIAGNOSIS — I359 Nonrheumatic aortic valve disorder, unspecified: Secondary | ICD-10-CM | POA: Diagnosis not present

## 2013-11-01 DIAGNOSIS — F17200 Nicotine dependence, unspecified, uncomplicated: Secondary | ICD-10-CM | POA: Diagnosis not present

## 2013-11-01 DIAGNOSIS — E079 Disorder of thyroid, unspecified: Secondary | ICD-10-CM | POA: Diagnosis not present

## 2013-11-01 DIAGNOSIS — J449 Chronic obstructive pulmonary disease, unspecified: Secondary | ICD-10-CM | POA: Diagnosis not present

## 2013-11-01 DIAGNOSIS — F341 Dysthymic disorder: Secondary | ICD-10-CM | POA: Diagnosis not present

## 2013-11-01 DIAGNOSIS — F419 Anxiety disorder, unspecified: Secondary | ICD-10-CM | POA: Diagnosis not present

## 2013-11-01 DIAGNOSIS — E43 Unspecified severe protein-calorie malnutrition: Secondary | ICD-10-CM | POA: Diagnosis not present

## 2013-11-01 DIAGNOSIS — I10 Essential (primary) hypertension: Secondary | ICD-10-CM | POA: Diagnosis not present

## 2013-11-01 DIAGNOSIS — N289 Disorder of kidney and ureter, unspecified: Secondary | ICD-10-CM | POA: Diagnosis not present

## 2013-11-01 DIAGNOSIS — F329 Major depressive disorder, single episode, unspecified: Secondary | ICD-10-CM | POA: Diagnosis not present

## 2013-11-01 DIAGNOSIS — E785 Hyperlipidemia, unspecified: Secondary | ICD-10-CM | POA: Diagnosis not present

## 2013-11-01 DIAGNOSIS — M6281 Muscle weakness (generalized): Secondary | ICD-10-CM | POA: Diagnosis not present

## 2013-11-01 DIAGNOSIS — E0590 Thyrotoxicosis, unspecified without thyrotoxic crisis or storm: Secondary | ICD-10-CM | POA: Diagnosis not present

## 2013-11-01 DIAGNOSIS — E860 Dehydration: Secondary | ICD-10-CM | POA: Diagnosis not present

## 2013-11-01 DIAGNOSIS — L723 Sebaceous cyst: Secondary | ICD-10-CM | POA: Diagnosis not present

## 2013-11-01 DIAGNOSIS — M818 Other osteoporosis without current pathological fracture: Secondary | ICD-10-CM | POA: Diagnosis not present

## 2013-11-01 DIAGNOSIS — R5381 Other malaise: Secondary | ICD-10-CM | POA: Diagnosis not present

## 2013-11-01 DIAGNOSIS — J309 Allergic rhinitis, unspecified: Secondary | ICD-10-CM | POA: Diagnosis not present

## 2013-11-01 DIAGNOSIS — I350 Nonrheumatic aortic (valve) stenosis: Secondary | ICD-10-CM | POA: Diagnosis not present

## 2013-11-01 DIAGNOSIS — I6350 Cerebral infarction due to unspecified occlusion or stenosis of unspecified cerebral artery: Secondary | ICD-10-CM | POA: Diagnosis not present

## 2013-11-01 DIAGNOSIS — E162 Hypoglycemia, unspecified: Secondary | ICD-10-CM | POA: Diagnosis not present

## 2013-11-01 DIAGNOSIS — R110 Nausea: Secondary | ICD-10-CM | POA: Diagnosis not present

## 2013-11-01 DIAGNOSIS — R05 Cough: Secondary | ICD-10-CM | POA: Diagnosis not present

## 2013-11-01 DIAGNOSIS — F411 Generalized anxiety disorder: Secondary | ICD-10-CM | POA: Diagnosis not present

## 2013-11-01 DIAGNOSIS — I509 Heart failure, unspecified: Secondary | ICD-10-CM | POA: Diagnosis not present

## 2013-11-01 DIAGNOSIS — Z79899 Other long term (current) drug therapy: Secondary | ICD-10-CM | POA: Diagnosis not present

## 2013-11-01 DIAGNOSIS — J441 Chronic obstructive pulmonary disease with (acute) exacerbation: Secondary | ICD-10-CM | POA: Diagnosis not present

## 2013-11-01 DIAGNOSIS — F3289 Other specified depressive episodes: Secondary | ICD-10-CM | POA: Diagnosis not present

## 2013-11-01 DIAGNOSIS — R627 Adult failure to thrive: Secondary | ICD-10-CM | POA: Diagnosis not present

## 2013-11-01 DIAGNOSIS — R0989 Other specified symptoms and signs involving the circulatory and respiratory systems: Secondary | ICD-10-CM | POA: Diagnosis not present

## 2013-11-01 DIAGNOSIS — E559 Vitamin D deficiency, unspecified: Secondary | ICD-10-CM | POA: Diagnosis not present

## 2013-11-01 DIAGNOSIS — F0390 Unspecified dementia without behavioral disturbance: Secondary | ICD-10-CM | POA: Diagnosis not present

## 2013-11-01 LAB — CBC WITH DIFFERENTIAL/PLATELET
BASOS ABS: 0 10*3/uL (ref 0.0–0.1)
BASOS PCT: 0 % (ref 0–1)
EOS PCT: 4 % (ref 0–5)
Eosinophils Absolute: 0.2 10*3/uL (ref 0.0–0.7)
HCT: 34.2 % — ABNORMAL LOW (ref 36.0–46.0)
Hemoglobin: 10.9 g/dL — ABNORMAL LOW (ref 12.0–15.0)
Lymphocytes Relative: 27 % (ref 12–46)
Lymphs Abs: 1.7 10*3/uL (ref 0.7–4.0)
MCH: 30 pg (ref 26.0–34.0)
MCHC: 31.9 g/dL (ref 30.0–36.0)
MCV: 94.2 fL (ref 78.0–100.0)
Monocytes Absolute: 0.6 10*3/uL (ref 0.1–1.0)
Monocytes Relative: 9 % (ref 3–12)
Neutro Abs: 3.7 10*3/uL (ref 1.7–7.7)
Neutrophils Relative %: 60 % (ref 43–77)
Platelets: 313 10*3/uL (ref 150–400)
RBC: 3.63 MIL/uL — ABNORMAL LOW (ref 3.87–5.11)
RDW: 15.8 % — AB (ref 11.5–15.5)
WBC: 6.2 10*3/uL (ref 4.0–10.5)

## 2013-11-01 LAB — COMPREHENSIVE METABOLIC PANEL
ALT: 14 U/L (ref 0–35)
AST: 19 U/L (ref 0–37)
Albumin: 2.2 g/dL — ABNORMAL LOW (ref 3.5–5.2)
Alkaline Phosphatase: 49 U/L (ref 39–117)
Anion gap: 6 (ref 5–15)
BILIRUBIN TOTAL: 0.2 mg/dL — AB (ref 0.3–1.2)
BUN: 14 mg/dL (ref 6–23)
CHLORIDE: 105 meq/L (ref 96–112)
CO2: 30 meq/L (ref 19–32)
Calcium: 8.6 mg/dL (ref 8.4–10.5)
Creatinine, Ser: 0.88 mg/dL (ref 0.50–1.10)
GFR calc Af Amer: 75 mL/min — ABNORMAL LOW (ref >90)
GFR, EST NON AFRICAN AMERICAN: 65 mL/min — AB (ref >90)
Glucose, Bld: 80 mg/dL (ref 70–99)
Potassium: 4.9 mEq/L (ref 3.7–5.3)
Sodium: 141 mEq/L (ref 137–147)
Total Protein: 4.8 g/dL — ABNORMAL LOW (ref 6.0–8.3)

## 2013-11-01 LAB — GLUCOSE, CAPILLARY
GLUCOSE-CAPILLARY: 67 mg/dL — AB (ref 70–99)
Glucose-Capillary: 98 mg/dL (ref 70–99)
Glucose-Capillary: 78 mg/dL (ref 70–99)
Glucose-Capillary: 79 mg/dL (ref 70–99)
Glucose-Capillary: 103 mg/dL — ABNORMAL HIGH (ref 70–99)

## 2013-11-01 MED ORDER — IPRATROPIUM-ALBUTEROL 20-100 MCG/ACT IN AERS: 1.0000 | INHALATION_SPRAY | Freq: Four times a day (QID) | RESPIRATORY_TRACT | Status: DC | PRN

## 2013-11-01 MED ORDER — ALBUTEROL SULFATE (2.5 MG/3ML) 0.083% IN NEBU: 2.5000 mg | INHALATION_SOLUTION | Freq: Three times a day (TID) | RESPIRATORY_TRACT | Status: DC

## 2013-11-01 MED ORDER — ASPIRIN 81 MG PO TABS: 81.0000 mg | ORAL_TABLET | Freq: Every day | ORAL | Status: AC

## 2013-11-01 MED ORDER — FOLIC ACID 1 MG PO TABS: 1.0000 mg | ORAL_TABLET | Freq: Every day | ORAL | Status: DC

## 2013-11-01 MED ORDER — PREDNISONE 10 MG PO TABS: ORAL_TABLET | ORAL | Status: DC

## 2013-11-01 MED ORDER — CYANOCOBALAMIN 1000 MCG/ML IJ SOLN: 1000.0000 ug | Freq: Once | INTRAMUSCULAR | Status: DC

## 2013-11-01 NOTE — Progress Notes (Signed)
Hypoglycemic Event  CBG: 67  Treatment: 15 GM carbohydrate snack  Symptoms: None  Follow-up CBG: Time: 08:04 CBG Result: 79  Possible Reasons for Event: Unknown  Comments/MD notified: Blood sugar up to 79 with 15 gm carb snack.  Will continue to monitor patient.     Clerence Gubser R  Remember to initiate Hypoglycemia Order Set & complete

## 2013-11-01 NOTE — Discharge Summary (Addendum)
Physician Discharge Summary  [name] UXL:[id number] DOB: [birth year] DOA: 10/28/2013  PCP: Tula Nakayama, MD  Admit date: 10/28/2013 Discharge date: 11/01/2013  Time spent: 13minutes  Recommendations for Outpatient Follow-up:  1. Patient will be discharged to [facility] 2. Follow up with GI as an outpatient to further evaluate heme positive stools 3. Needs repeat CT chest with IV contrast in the next 6-12 months to evaluate 20mm right upper lobe lung nodule 4. Continue to check cbg 4 times a day, since patient does have episodes of hypoglycemia in AM that correct with oral intake. May benefit from having high calorie snack at bedtime.  Discharge Diagnoses:  Active Problems:   AORTIC VALVE DISEASE   CHRONIC OBSTRUCTIVE PULMONARY DISEASE   Dehydration   Weakness   Nausea   Failure to thrive in adult   Protein-calorie malnutrition, severe   Cerebellar stroke  generalized weakness related to failure to thrive, malnutrition, dehydration and poor by mouth intake  Discharge Condition: stable  Diet recommendation: regular diet  Filed Weights   10/30/13 0447 10/31/13 0500 11/01/13 0504  Weight: 39.3 kg (86 lb 10.3 oz) 40.1 kg (88 lb 6.5 oz) 41.4 kg (91 lb 4.3 oz)    History of present illness and hospital course:  This patient was to the hospital with progressive weakness, nausea and failure to thrive. the patient has multiple medical problems including end-stage COPD, hypertension, dementia, aortic stenosis status post replacement and progressive weight loss. Her family felt that she recently had a similar presentation 4-5 months no as well as the dehydration. She is no longer able to care for herself at home. She was admitted to the hospital and started on intravenous hydration. She did not have any infectious source. She did not have any fevers or leukocytosis. The dehydration improved with IV fluids. Her weakness appears to be related to deconditioning, decreased by mouth intake and  failure to thrive. She was seen by physical therapy who recommended skilled nursing facility placement  MRI of the brain was ordered to further evaluate generalized weakness. She doesn't have a small cerebellar infarct. Further stroke workup was unremarkable including carotid Dopplers and 2-D echocardiogram. She was seen by neurology who felt that she should be on daily aspirin, since she has not been taking this regularly. Lipid panel indicated an LDL less than 100.   She was also noted to have heme-positive stools, was having brown colored stools, and stable hemoglobin. She was placed on a proton pump inhibitor. Case was discussed with Dr. Oneida Alar, gastroenterology who will follow patient up in the outpatient setting to discuss further workup.  The patient was noted to have intermittent episodes of hypoglycemia, mostly in the morning. These episodes would correct with by mouth intake. Recommended to continue to check CBGs periodically during the day. She may benefit from a high calorie snack at bedtime.  Patient also had CT scan of the chest done which indicated a right upper lobe nodule which was new. This will need to be followed up with repeat CT in 6-12 months. Previously seen pulmonary nodules appear to be stable.  Her COPD is quite advanced. She is chronically on 2 L of oxygen. She's was continued on bronchodilators. Air entry appears to be fair bilaterally. She does have mildly increased work of breathing which may be her baseline. She will be placed on a short course of prednisone which should help her breathing and may also improve her appetite.  Procedures: Echo:  Overall limited images. There is  mild LVH with LVEF approximately 21-30%, grade 1 diastolic dysfunction with increased filling pressures. Bioprosthetic aortic valve is poorly visualized, no obvious aortic regurgitation or perivalvular leak, no gradients reported. Trivial tricuspid regurgitation, unable to assess  PASP    Consultations:  Neurology  Discharge Exam: Filed Vitals:   11/01/13 1048  BP: 128/58  Pulse: 60  Temp:   Resp:     General: NAD Cardiovascular: s1, s2, rrr Respiratory: diminished breath sounds bilaterally, but otherwise clear  Discharge Instructions You were cared for by a hospitalist during your hospital stay. If you have any questions about your discharge medications or the care you received while you were in the hospital after you are discharged, you can call the unit and asked to speak with the hospitalist on call if the hospitalist that took care of you is not available. Once you are discharged, your primary care physician will handle any further medical issues. Please note that NO REFILLS for any discharge medications will be authorized once you are discharged, as it is imperative that you return to your primary care physician (or establish a relationship with a primary care physician if you do not have one) for your aftercare needs so that they can reassess your need for medications and monitor your lab values.  Discharge Instructions   Call MD for:  difficulty breathing, headache or visual disturbances    Complete by:  As directed      Call MD for:  temperature >100.4    Complete by:  As directed      Diet general    Complete by:  As directed      Increase activity slowly    Complete by:  As directed           Current Discharge Medication List    START taking these medications   Details  albuterol (PROVENTIL) (2.5 MG/3ML) 0.083% nebulizer solution Take 3 mLs (2.5 mg total) by nebulization 3 (three) times daily. Qty: 75 mL, Refills: 12    aspirin 81 MG tablet Take 1 tablet (81 mg total) by mouth daily. Qty: 30 tablet, Refills: 1    predniSONE (DELTASONE) 10 MG tablet 40mg  po daily for 2 days then 30mg  po daily for 2 days then 20mg  po daily for 2 days then 10mg  po daily for 2 days then stop      CONTINUE these medications which have CHANGED   Details   Ipratropium-Albuterol (COMBIVENT) 20-100 MCG/ACT AERS respimat Inhale 1 puff into the lungs every 6 (six) hours as needed for wheezing. Qty: 4 g, Refills: 6      CONTINUE these medications which have NOT CHANGED   Details  Calcium Carbonate-Vit D-Min (QC CALCIUM/MINERALS/VITAMIN D) 600-400 MG-UNIT TABS Take 1 tablet by mouth every evening.     Choline Fenofibrate (TRILIPIX) 135 MG capsule Take 135 mg by mouth at bedtime.    donepezil (ARICEPT) 10 MG tablet Take 10 mg by mouth at bedtime.    fluticasone (FLONASE) 50 MCG/ACT nasal spray Place 2 sprays into the nose daily as needed. Sinus/congestion    lansoprazole (PREVACID) 30 MG capsule Take 30 mg by mouth daily at 12 noon.    loratadine (CLARITIN) 10 MG tablet Take 10 mg by mouth every morning.     losartan (COZAAR) 25 MG tablet Take 12.5 mg by mouth daily as needed. If blood pressure levels are normal Patient does not take medication    mirtazapine (REMERON) 7.5 MG tablet Take 7.5 mg by mouth at bedtime as  needed (sleep).    Multiple Vitamins-Minerals (MULTIVITAMINS THER. W/MINERALS) TABS tablet Take 1 tablet by mouth daily.    Omega-3 Fatty Acids (FISH OIL CONCENTRATE) 1000 MG CAPS Take 1 capsule by mouth 2 (two) times daily.     propranolol (INDERAL) 20 MG tablet Take 20 mg by mouth daily.    tiotropium (SPIRIVA) 18 MCG inhalation capsule Place 18 mcg into inhaler and inhale daily.    vitamin C (ASCORBIC ACID) 500 MG tablet Take 500 mg by mouth daily.    albuterol (PROVENTIL HFA) 108 (90 BASE) MCG/ACT inhaler Inhale 2 puffs into the lungs every 6 (six) hours as needed. Shortness of breath      STOP taking these medications     aspirin EC 81 MG tablet        Allergies  Allergen Reactions  . Levofloxacin Anaphylaxis  . Nitrofurantoin Shortness Of Breath    Acute dyspnea  . Bupropion Hcl Other (See Comments)    unknown  . Cyclobenzaprine Hcl Other (See Comments)    unknown  . Gadolinium      Desc: PT REFUSES MR &  CT CONTRAST DUE TO PRIOR REACTION, PT STATES HAIR FELL OUT & SKIN CHANGED COLOR   . Lorazepam     Causes mental confusion/breakdown  . Penicillins Other (See Comments)    Child hood allergy  . Statins Other (See Comments)    Joint stiffness  . Sulfonamide Derivatives Other (See Comments)    unknown      The results of significant diagnostics from this hospitalization (including imaging, microbiology, ancillary and laboratory) are listed below for reference.    Significant Diagnostic Studies: Ct Angio Chest Pe W/cm &/or Wo Cm  10/29/2013   CLINICAL DATA:  73 year old female with pain. Weakness, nausea. Initial encounter. Chronic obstructed pulmonary disease. History of surgically treated aortic stenosis.  EXAM: CT ANGIOGRAPHY CHEST  CT ABDOMEN AND PELVIS WITH CONTRAST  TECHNIQUE: Multidetector CT imaging of the chest was performed using the standard protocol during bolus administration of intravenous contrast. Multiplanar CT image reconstructions and MIPs were obtained to evaluate the vascular anatomy. Multidetector CT imaging of the abdomen and pelvis was performed using the standard protocol during bolus administration of intravenous contrast.  CONTRAST:  32mL OMNIPAQUE IOHEXOL 300 MG/ML  SOLN  COMPARISON:  CT Abdomen and Pelvis 08/02/2010.  Chest CT 08/15/2010.  FINDINGS: CTA CHEST FINDINGS  Good contrast bolus timing in the pulmonary arterial tree. No focal filling defect identified in the pulmonary arterial tree to suggest the presence of acute pulmonary embolism.  Stable lung volumes. Major airways are patent and stable. Chronic centrilobular emphysema. Stable right apical lung nodule measuring 6 mm, and partially calcified on series 5, image 17. New 6 mm posterior right upper lobe nodule since 2012. Series 5, image 30. Small adjacent distal airway nodularity (tree-in-bud). Stable small right middle lobe subpleural nodules. Stable peripheral right lower lobe nodularity (images 66, 70, 77). No  right pleural effusion.  Stable lateral left upper lobe nodularity (e.g. image 37). Mildly increased left medial basal lower lobe segment atelectasis. Stable occasional peripheral left lower lobe nodules.  No pericardial or pleural effusion. Ectatic thoracic aorta with chronic atherosclerosis. Patent proximal great vessels. Stable subcentimeter thyroid nodules. No axillary lymphadenopathy. No mediastinal or hilar lymphadenopathy. Sequelae of median sternotomy.  Osteopenia.   No acute osseous abnormality identified.  CT ABDOMEN and PELVIS FINDINGS  Mild L3 compression fracture is new since 2012. Loss of height 25%. No bony retropulsion. Other lumbar levels are  stable. Osteopenia.  Foley catheter in place. Decompressed bladder. Oral contrast mixed with stool in the rectum. No pelvic free fluid. Negative for age uterus and adnexa. Redundant sigmoid colon with diverticula. Mild wall thickening maybe artifact due to under distension. No definite mesenteric stranding.  Negative left: And splenic flexure. Redundant transverse colon, otherwise negative. Normal right colon and appendix. No dilated small bowel. Decompressed stomach. Duodenum within normal limits.  Stable liver with surgically absent gallbladder and prominent biliary tree. Portal venous system appears patent and within normal limits. Diminutive spleen. Pancreas and adrenal glands within normal limits. Kidneys within normal limits. Extensive Aortoiliac calcified atherosclerosis noted. Major arterial structures in the abdomen and pelvis are patent. No abdominal free fluid. No lymphadenopathy identified.  Review of the MIP images confirms the above findings.  IMPRESSION: 1.  No evidence of acute pulmonary embolus. 2. New 6 mm right upper lobe nodule (Series 5, image 30). Followup chest CT at 6-12 months is recommended. This recommendation follows the consensus statement: Guidelines for Management of Small Pulmonary Nodules Detected on CT Scans: A Statement from  the [place] as published in Radiology 2005;237:395-400. 3. Multiple other bilateral pulmonary nodules are stable since 2012, benign. 4. Stable, negative abdomen. Redundant sigmoid colon with diverticula, mild colitis here difficult to exclude. 5. Mild L3 compression fracture is new since 2012, could be subacute. If specific therapy such as vertebroplasty is desired, lumbar MRI or whole-body bone scan would best determine acuity.   Electronically Signed   By: Lars Pinks M.D.   On: 10/29/2013 12:04   Mr Brain Wo Contrast  10/29/2013   CLINICAL DATA:  Weakness.  EXAM: MRI HEAD WITHOUT CONTRAST  TECHNIQUE: Multiplanar, multiecho pulse sequences of the brain and surrounding structures were obtained without intravenous contrast.  COMPARISON:  MRI 07/30/2012, CT 07/28/2012  FINDINGS: Image quality is degraded by motion particularly late in the study. Multiple attempts were made but the patient was confused and not able to hold still.  Small area of acute infarct right cerebellum measuring 5 x 10 mm. No other acute infarct identified  Chronic microvascular ischemic changes in the white matter. Small chronic infarct right parietal lobe posteriorly. Brainstem appears normal. Small chronic infarct left and right cerebellum.  Negative for hemorrhage or mass.  Negative for hydrocephalus.  IMPRESSION: Small acute infarct right cerebellum.   Electronically Signed   By: Franchot Gallo M.D.   On: 10/29/2013 12:42   Ct Abdomen Pelvis W Contrast  10/29/2013   CLINICAL DATA:  73 year old female with pain. Weakness, nausea. Initial encounter. Chronic obstructed pulmonary disease. History of surgically treated aortic stenosis.  EXAM: CT ANGIOGRAPHY CHEST  CT ABDOMEN AND PELVIS WITH CONTRAST  TECHNIQUE: Multidetector CT imaging of the chest was performed using the standard protocol during bolus administration of intravenous contrast. Multiplanar CT image reconstructions and MIPs were obtained to evaluate the vascular  anatomy. Multidetector CT imaging of the abdomen and pelvis was performed using the standard protocol during bolus administration of intravenous contrast.  CONTRAST:  75mL OMNIPAQUE IOHEXOL 300 MG/ML  SOLN  COMPARISON:  CT Abdomen and Pelvis 08/02/2010.  Chest CT 08/15/2010.  FINDINGS: CTA CHEST FINDINGS  Good contrast bolus timing in the pulmonary arterial tree. No focal filling defect identified in the pulmonary arterial tree to suggest the presence of acute pulmonary embolism.  Stable lung volumes. Major airways are patent and stable. Chronic centrilobular emphysema. Stable right apical lung nodule measuring 6 mm, and partially calcified on series 5, image 17. New 6  mm posterior right upper lobe nodule since 2012. Series 5, image 30. Small adjacent distal airway nodularity (tree-in-bud). Stable small right middle lobe subpleural nodules. Stable peripheral right lower lobe nodularity (images 66, 70, 77). No right pleural effusion.  Stable lateral left upper lobe nodularity (e.g. image 37). Mildly increased left medial basal lower lobe segment atelectasis. Stable occasional peripheral left lower lobe nodules.  No pericardial or pleural effusion. Ectatic thoracic aorta with chronic atherosclerosis. Patent proximal great vessels. Stable subcentimeter thyroid nodules. No axillary lymphadenopathy. No mediastinal or hilar lymphadenopathy. Sequelae of median sternotomy.  Osteopenia.   No acute osseous abnormality identified.  CT ABDOMEN and PELVIS FINDINGS  Mild L3 compression fracture is new since 2012. Loss of height 25%. No bony retropulsion. Other lumbar levels are stable. Osteopenia.  Foley catheter in place. Decompressed bladder. Oral contrast mixed with stool in the rectum. No pelvic free fluid. Negative for age uterus and adnexa. Redundant sigmoid colon with diverticula. Mild wall thickening maybe artifact due to under distension. No definite mesenteric stranding.  Negative left: And splenic flexure. Redundant  transverse colon, otherwise negative. Normal right colon and appendix. No dilated small bowel. Decompressed stomach. Duodenum within normal limits.  Stable liver with surgically absent gallbladder and prominent biliary tree. Portal venous system appears patent and within normal limits. Diminutive spleen. Pancreas and adrenal glands within normal limits. Kidneys within normal limits. Extensive Aortoiliac calcified atherosclerosis noted. Major arterial structures in the abdomen and pelvis are patent. No abdominal free fluid. No lymphadenopathy identified.  Review of the MIP images confirms the above findings.  IMPRESSION: 1.  No evidence of acute pulmonary embolus. 2. New 6 mm right upper lobe nodule (Series 5, image 30). Followup chest CT at 6-12 months is recommended. This recommendation follows the consensus statement: Guidelines for Management of Small Pulmonary Nodules Detected on CT Scans: A Statement from the [place] as published in Radiology 2005;237:395-400. 3. Multiple other bilateral pulmonary nodules are stable since 2012, benign. 4. Stable, negative abdomen. Redundant sigmoid colon with diverticula, mild colitis here difficult to exclude. 5. Mild L3 compression fracture is new since 2012, could be subacute. If specific therapy such as vertebroplasty is desired, lumbar MRI or whole-body bone scan would best determine acuity.   Electronically Signed   By: Lars Pinks M.D.   On: 10/29/2013 12:04   US Carotid Bilateral  10/30/2013   CLINICAL DATA:  Hypertension, stroke, hyperlipidemia  EXAM: BILATERAL CAROTID DUPLEX ULTRASOUND  TECHNIQUE: Pearline Cables scale imaging, color Doppler and duplex ultrasound were performed of bilateral carotid and vertebral arteries in the neck.  COMPARISON:  None.  FINDINGS: Criteria: Quantification of carotid stenosis is based on velocity parameters that correlate the residual internal carotid diameter with NASCET-based stenosis levels, using the diameter of the distal  internal carotid lumen as the denominator for stenosis measurement.  The following velocity measurements were obtained:  RIGHT  ICA:  77/22 cm/sec  CCA:  86/5 cm/sec  SYSTOLIC ICA/CCA RATIO:  7.84  DIASTOLIC ICA/CCA RATIO:  6.96  ECA:  161 cm/sec  LEFT  ICA:  73/16 cm/sec  CCA:  29/5 cm/sec  SYSTOLIC ICA/CCA RATIO:  2.1  DIASTOLIC ICA/CCA RATIO:  2.2  ECA:  86 cm/sec  RIGHT CAROTID ARTERY: Minor echogenic shadowing plaque formation. No hemodynamically significant right ICA stenosis, velocity elevation, or turbulent flow. Degree of narrowing less than 50%.  RIGHT VERTEBRAL ARTERY:  Antegrade  LEFT CAROTID ARTERY: Similar scattered minor echogenic plaque formation. No hemodynamically significant left ICA stenosis, velocity elevation, or  turbulent flow.  LEFT VERTEBRAL ARTERY:  Antegrade  IMPRESSION: Mild to moderate carotid atherosclerosis. No hemodynamically significant ICA stenosis by ultrasound.   Electronically Signed   By: Daryll Brod M.D.   On: 10/30/2013 13:35   Dg Chest Port 1 View  10/30/2013   CLINICAL DATA:  Shortness of breath, weakness, nausea, vomiting, stroke, hypertension, renal insufficiency, COPD, dementia  EXAM: PORTABLE CHEST - 1 VIEW  COMPARISON:  Portable exam 1947 hr compared to 10/28/2013  FINDINGS: Normal heart size post median sternotomy, by history post AVR.  Atherosclerotic calcification aorta.  Mediastinal contours and pulmonary vascularity normal.  Emphysematous changes with mild streaky atelectasis in medial LEFT lower lobe.  No pulmonary infiltrate, pleural effusion or pneumothorax.  Bones demineralized.  IMPRESSION: COPD changes with minimal streaky atelectasis in LEFT lower lobe.  No acute abnormalities.   Electronically Signed   By: Lavonia Dana M.D.   On: 10/30/2013 20:04   Dg Chest Portable 1 View  10/28/2013   CLINICAL DATA:  Weakness, shortness of breath.  EXAM: PORTABLE CHEST - 1 VIEW  COMPARISON:  07/29/2013  FINDINGS: Prior CABG. Stable hyperinflation of the lungs  compatible with COPD. Heart is normal size. Lungs are clear. No effusions. No acute bony abnormality.  IMPRESSION: Stable hyperinflation/COPD.  No active disease.   Electronically Signed   By: Rolm Baptise M.D.   On: 10/28/2013 16:58    Microbiology: Recent Results (from the past 240 hour(s))  CULTURE, BLOOD (ROUTINE X 2)     Status: None   Collection Time    10/29/13  7:13 AM      Result Value Ref Range Status   Specimen Description BLOOD LEFT ARM   Final   Special Requests BOTTLES DRAWN AEROBIC AND ANAEROBIC 6CC   Final   Culture NO GROWTH 1 DAY   Final   Report Status PENDING   Incomplete  CULTURE, BLOOD (ROUTINE X 2)     Status: None   Collection Time    10/29/13  7:13 AM      Result Value Ref Range Status   Specimen Description BLOOD LEFT ARM   Final   Special Requests BOTTLES DRAWN AEROBIC ONLY 6CC   Final   Culture NO GROWTH 1 DAY   Final   Report Status PENDING   Incomplete  URINE CULTURE     Status: None   Collection Time    10/29/13 11:10 AM      Result Value Ref Range Status   Specimen Description URINE, CLEAN CATCH   Final   Special Requests NONE   Final   Culture  Setup Time     Final   Value: 10/29/2013 11:25     Performed at [facility]     Final   Value: 2,000 COLONIES/ML     Performed at [facility]   Culture     Final   Value: INSIGNIFICANT GROWTH     Performed at [facility]   Report Status 10/30/2013 FINAL   Final  CLOSTRIDIUM DIFFICILE BY PCR     Status: None   Collection Time    10/31/13  8:57 AM      Result Value Ref Range Status   C difficile by pcr NEGATIVE  NEGATIVE Final     Labs: Basic Metabolic Panel:  Recent Labs Lab 10/28/13 1650 10/28/13 2024 10/29/13 0223 10/30/13 0543 10/31/13 0500 11/01/13 0619  NA 143  --  145 142 145 141  K  3.3*  --  3.8 3.1* 4.8 4.9  CL 95*  --  100 102 106 105  CO2 33*  --  32 31 29 30   GLUCOSE 143*  --  128* 59* 65* 80  BUN 19  --  19 15 10 14   CREATININE  0.62  --  0.67 0.64 0.66 0.88  CALCIUM 10.0  --  9.1 8.5 8.9 8.6  MG  --  1.6  --   --  1.4*  --   PHOS  --  3.5  --   --   --   --    Liver Function Tests:  Recent Labs Lab 10/28/13 1650 10/29/13 0223 10/30/13 0543 10/31/13 0500 11/01/13 0619  AST 36 27 25 27 19   ALT 22 17 14 17 14   ALKPHOS 68 55 48 56 49  BILITOT 0.5 0.4 0.4 0.4 0.2*  PROT 6.8 5.6* 4.9* 5.4* 4.8*  ALBUMIN 3.2* 2.6* 2.3* 2.5* 2.2*    Recent Labs Lab 10/28/13 1650  LIPASE 51   No results found for this basename: AMMONIA,  in the last 168 hours CBC:  Recent Labs Lab 10/28/13 1650 10/29/13 0223 10/30/13 0543 10/31/13 0500 11/01/13 0619  WBC 6.0 5.3 7.8 8.6 6.2  NEUTROABS 3.8 4.8 5.4 6.3 3.7  HGB 13.5 11.2* 10.2* 11.9* 10.9*  HCT 41.9 35.5* 31.8* 36.6 34.2*  MCV 92.9 94.4 93.0 92.9 94.2  PLT 419* 356 346 361 313   Cardiac Enzymes:  Recent Labs Lab 10/28/13 2024 10/29/13 0223 10/29/13 0713  TROPONINI <0.30 <0.30 <0.30   BNP: BNP (last 3 results)  Recent Labs  07/29/13 1544 10/28/13 2024  PROBNP 352.5* 669.3*   CBG:  Recent Labs Lab 11/01/13 0020 11/01/13 0508 11/01/13 0723 11/01/13 0804 11/01/13 1142  GLUCAP 98 78 67* 79 103*       Signed:  MEMON,JEHANZEB  [facility] 11/01/2013, 12:07 PM

## 2013-11-01 NOTE — Progress Notes (Signed)
Patient's IV was removed and was clean, dry, and intact at removal.  Patient was discharged to [facility] and family was transporting patient to [facility].  Per [facility], patient was discharged on hospital oxygen tank, 2L on nasal canula.  Family agrees to return oxygen tank once patient is settled at [facility].  Patient was in stable condition at discharge and had no questions/concerns.

## 2013-11-01 NOTE — Progress Notes (Signed)
Report given report to Sana at [facility].

## 2013-11-02 LAB — VITAMIN D 1,25 DIHYDROXY
VITAMIN D 1, 25 (OH) TOTAL: 38 pg/mL (ref 18–72)
VITAMIN D3 1, 25 (OH): 38 pg/mL

## 2013-11-03 DIAGNOSIS — J449 Chronic obstructive pulmonary disease, unspecified: Secondary | ICD-10-CM | POA: Diagnosis not present

## 2013-11-03 DIAGNOSIS — F0280 Dementia in other diseases classified elsewhere without behavioral disturbance: Secondary | ICD-10-CM | POA: Diagnosis not present

## 2013-11-03 DIAGNOSIS — E162 Hypoglycemia, unspecified: Secondary | ICD-10-CM | POA: Diagnosis not present

## 2013-11-03 DIAGNOSIS — R627 Adult failure to thrive: Secondary | ICD-10-CM | POA: Diagnosis not present

## 2013-11-03 LAB — CULTURE, BLOOD (ROUTINE X 2)
CULTURE: NO GROWTH
Culture: NO GROWTH

## 2013-11-03 NOTE — Care Management Note (Signed)
    Page 1 of 1   11/03/2013     3:30:55 PM CARE MANAGEMENT NOTE 11/03/2013  Patient:  [name], [name]   Account Number:  [id number]  Date Initiated:  10/31/2013  Documentation initiated by:  Vladimir Creeks  Subjective/Objective Assessment:   Admitted from home, with sister. Pt has hjad [name] CVA, and has decided, along with her family to go to SNF.     Action/Plan:   CSW aware   Anticipated DC Date:  11/01/2013   Anticipated DC Plan:  SKILLED NURSING FACILITY  In-house referral  Clinical Social Worker      DC Planning Services  CM consult      Choice offered to / List presented to:             Status of service:  Completed, signed off Medicare Important Message given?  YES (If response is "NO", the following Medicare IM given date fields will be blank) Date Medicare IM given:  10/31/2013 Medicare IM given by:  Vladimir Creeks Date Additional Medicare IM given:   Additional Medicare IM given by:    Discharge Disposition:  [place]  Per UR Regulation:  Reviewed for med. necessity/level of care/duration of stay  If discussed at [place] of Stay Meetings, dates discussed:    Comments:  10/31/13 1300 Vladimir Creeks RN/CM

## 2013-11-04 DIAGNOSIS — E860 Dehydration: Secondary | ICD-10-CM | POA: Diagnosis not present

## 2013-11-04 DIAGNOSIS — E162 Hypoglycemia, unspecified: Secondary | ICD-10-CM | POA: Diagnosis not present

## 2013-11-04 DIAGNOSIS — E559 Vitamin D deficiency, unspecified: Secondary | ICD-10-CM | POA: Diagnosis not present

## 2013-11-04 DIAGNOSIS — Z79899 Other long term (current) drug therapy: Secondary | ICD-10-CM | POA: Diagnosis not present

## 2013-11-20 DIAGNOSIS — I359 Nonrheumatic aortic valve disorder, unspecified: Secondary | ICD-10-CM | POA: Diagnosis not present

## 2013-11-20 DIAGNOSIS — N289 Disorder of kidney and ureter, unspecified: Secondary | ICD-10-CM | POA: Diagnosis not present

## 2013-11-20 DIAGNOSIS — I00 Rheumatic fever without heart involvement: Secondary | ICD-10-CM | POA: Diagnosis not present

## 2013-11-20 DIAGNOSIS — R05 Cough: Secondary | ICD-10-CM | POA: Diagnosis not present

## 2013-11-20 DIAGNOSIS — J309 Allergic rhinitis, unspecified: Secondary | ICD-10-CM | POA: Diagnosis not present

## 2013-11-20 DIAGNOSIS — I6350 Cerebral infarction due to unspecified occlusion or stenosis of unspecified cerebral artery: Secondary | ICD-10-CM | POA: Diagnosis not present

## 2013-11-20 DIAGNOSIS — L723 Sebaceous cyst: Secondary | ICD-10-CM | POA: Diagnosis not present

## 2013-11-20 DIAGNOSIS — M818 Other osteoporosis without current pathological fracture: Secondary | ICD-10-CM | POA: Diagnosis not present

## 2013-11-20 DIAGNOSIS — I10 Essential (primary) hypertension: Secondary | ICD-10-CM | POA: Diagnosis not present

## 2013-11-20 DIAGNOSIS — M6281 Muscle weakness (generalized): Secondary | ICD-10-CM | POA: Diagnosis not present

## 2013-11-20 DIAGNOSIS — R488 Other symbolic dysfunctions: Secondary | ICD-10-CM | POA: Diagnosis not present

## 2013-11-20 DIAGNOSIS — E785 Hyperlipidemia, unspecified: Secondary | ICD-10-CM | POA: Diagnosis not present

## 2013-11-20 DIAGNOSIS — J449 Chronic obstructive pulmonary disease, unspecified: Secondary | ICD-10-CM | POA: Diagnosis not present

## 2013-11-20 DIAGNOSIS — F17200 Nicotine dependence, unspecified, uncomplicated: Secondary | ICD-10-CM | POA: Diagnosis not present

## 2013-11-20 DIAGNOSIS — E860 Dehydration: Secondary | ICD-10-CM | POA: Diagnosis not present

## 2013-11-20 DIAGNOSIS — R5383 Other fatigue: Secondary | ICD-10-CM | POA: Diagnosis not present

## 2013-11-20 DIAGNOSIS — R0989 Other specified symptoms and signs involving the circulatory and respiratory systems: Secondary | ICD-10-CM | POA: Diagnosis not present

## 2013-11-20 DIAGNOSIS — J441 Chronic obstructive pulmonary disease with (acute) exacerbation: Secondary | ICD-10-CM | POA: Diagnosis not present

## 2013-11-20 DIAGNOSIS — E0590 Thyrotoxicosis, unspecified without thyrotoxic crisis or storm: Secondary | ICD-10-CM | POA: Diagnosis not present

## 2013-11-20 DIAGNOSIS — E43 Unspecified severe protein-calorie malnutrition: Secondary | ICD-10-CM | POA: Diagnosis not present

## 2013-11-20 DIAGNOSIS — F0390 Unspecified dementia without behavioral disturbance: Secondary | ICD-10-CM | POA: Diagnosis not present

## 2013-11-20 DIAGNOSIS — F419 Anxiety disorder, unspecified: Secondary | ICD-10-CM | POA: Diagnosis not present

## 2013-11-20 DIAGNOSIS — I509 Heart failure, unspecified: Secondary | ICD-10-CM | POA: Diagnosis not present

## 2013-11-20 DIAGNOSIS — R627 Adult failure to thrive: Secondary | ICD-10-CM | POA: Diagnosis not present

## 2013-11-20 DIAGNOSIS — R4182 Altered mental status, unspecified: Secondary | ICD-10-CM | POA: Diagnosis not present

## 2013-11-20 DIAGNOSIS — R110 Nausea: Secondary | ICD-10-CM | POA: Diagnosis not present

## 2013-11-20 DIAGNOSIS — F341 Dysthymic disorder: Secondary | ICD-10-CM | POA: Diagnosis not present

## 2013-11-20 DIAGNOSIS — F329 Major depressive disorder, single episode, unspecified: Secondary | ICD-10-CM | POA: Diagnosis not present

## 2013-11-20 DIAGNOSIS — I350 Nonrheumatic aortic (valve) stenosis: Secondary | ICD-10-CM | POA: Diagnosis not present

## 2013-11-20 DIAGNOSIS — E162 Hypoglycemia, unspecified: Secondary | ICD-10-CM | POA: Diagnosis not present

## 2013-11-20 DIAGNOSIS — E079 Disorder of thyroid, unspecified: Secondary | ICD-10-CM | POA: Diagnosis not present

## 2013-11-28 DIAGNOSIS — J441 Chronic obstructive pulmonary disease with (acute) exacerbation: Secondary | ICD-10-CM | POA: Diagnosis not present

## 2013-11-28 DIAGNOSIS — J309 Allergic rhinitis, unspecified: Secondary | ICD-10-CM | POA: Diagnosis not present

## 2013-11-28 DIAGNOSIS — J449 Chronic obstructive pulmonary disease, unspecified: Secondary | ICD-10-CM | POA: Diagnosis not present

## 2013-12-01 ENCOUNTER — Telehealth: Payer: Self-pay | Admitting: Gastroenterology

## 2013-12-01 DIAGNOSIS — J449 Chronic obstructive pulmonary disease, unspecified: Secondary | ICD-10-CM | POA: Diagnosis not present

## 2013-12-01 DIAGNOSIS — J441 Chronic obstructive pulmonary disease with (acute) exacerbation: Secondary | ICD-10-CM | POA: Diagnosis not present

## 2013-12-01 DIAGNOSIS — J309 Allergic rhinitis, unspecified: Secondary | ICD-10-CM | POA: Diagnosis not present

## 2013-12-01 NOTE — Telephone Encounter (Signed)
APPT MADE AND SON NOTIFIED.

## 2013-12-01 NOTE — Telephone Encounter (Signed)
PLEASE CALL PT'S FACILITY OR FAMILY TO SCHEDULE A NPV DX: ANEMIA/HEME POSITIVE STOOLS WITH THE NEXT 4-6 WEEKS.

## 2013-12-01 NOTE — Telephone Encounter (Addendum)
------        Message from: Kathie Dike      Created: Fri Oct 31, 2013  8:07 PM      PT was admitted to the hospital with weakness and found to have a stroke. She has heme positive stools, hemoglobin is stable, she has brown stool without any evidence of gross bleeding. She reports a colonoscopy many years ago, does not know exactly when. She will likely discharge to skilled nursing facility on 9/12. If you don't mind seeing her in the outpatient setting to further work this up. She'll be placed on Protonix until then. She is on aspirin for stroke prevention            Thanks! ------

## 2013-12-08 DIAGNOSIS — F341 Dysthymic disorder: Secondary | ICD-10-CM | POA: Diagnosis not present

## 2013-12-08 DIAGNOSIS — E162 Hypoglycemia, unspecified: Secondary | ICD-10-CM | POA: Diagnosis not present

## 2013-12-08 DIAGNOSIS — R627 Adult failure to thrive: Secondary | ICD-10-CM | POA: Diagnosis not present

## 2013-12-08 DIAGNOSIS — J441 Chronic obstructive pulmonary disease with (acute) exacerbation: Secondary | ICD-10-CM | POA: Diagnosis not present

## 2013-12-16 ENCOUNTER — Other Ambulatory Visit: Payer: Self-pay

## 2013-12-16 MED ORDER — LOSARTAN POTASSIUM 25 MG PO TABS: 12.5000 mg | ORAL_TABLET | Freq: Every day | ORAL | Status: DC | PRN

## 2013-12-22 ENCOUNTER — Other Ambulatory Visit: Payer: Self-pay | Admitting: Family Medicine

## 2013-12-25 ENCOUNTER — Encounter: Payer: Self-pay | Admitting: Family Medicine

## 2013-12-25 ENCOUNTER — Ambulatory Visit (INDEPENDENT_AMBULATORY_CARE_PROVIDER_SITE_OTHER): Payer: Medicare Other

## 2013-12-25 ENCOUNTER — Ambulatory Visit (INDEPENDENT_AMBULATORY_CARE_PROVIDER_SITE_OTHER): Payer: Medicare Other | Admitting: Family Medicine

## 2013-12-25 VITALS — BP 130/80 | HR 70 | Resp 18 | Ht 61.0 in | Wt 90.1 lb

## 2013-12-25 DIAGNOSIS — I10 Essential (primary) hypertension: Secondary | ICD-10-CM

## 2013-12-25 DIAGNOSIS — R911 Solitary pulmonary nodule: Secondary | ICD-10-CM

## 2013-12-25 DIAGNOSIS — F5105 Insomnia due to other mental disorder: Secondary | ICD-10-CM

## 2013-12-25 DIAGNOSIS — I639 Cerebral infarction, unspecified: Secondary | ICD-10-CM | POA: Diagnosis not present

## 2013-12-25 DIAGNOSIS — Z23 Encounter for immunization: Secondary | ICD-10-CM

## 2013-12-25 DIAGNOSIS — E785 Hyperlipidemia, unspecified: Secondary | ICD-10-CM

## 2013-12-25 DIAGNOSIS — E559 Vitamin D deficiency, unspecified: Secondary | ICD-10-CM | POA: Diagnosis not present

## 2013-12-25 DIAGNOSIS — F0391 Unspecified dementia with behavioral disturbance: Secondary | ICD-10-CM | POA: Diagnosis not present

## 2013-12-25 DIAGNOSIS — R195 Other fecal abnormalities: Secondary | ICD-10-CM

## 2013-12-25 DIAGNOSIS — F418 Other specified anxiety disorders: Secondary | ICD-10-CM | POA: Diagnosis not present

## 2013-12-25 DIAGNOSIS — R7301 Impaired fasting glucose: Secondary | ICD-10-CM

## 2013-12-25 DIAGNOSIS — F341 Dysthymic disorder: Secondary | ICD-10-CM

## 2013-12-25 DIAGNOSIS — F03918 Unspecified dementia, unspecified severity, with other behavioral disturbance: Secondary | ICD-10-CM

## 2013-12-25 DIAGNOSIS — E0590 Thyrotoxicosis, unspecified without thyrotoxic crisis or storm: Secondary | ICD-10-CM | POA: Diagnosis not present

## 2013-12-25 NOTE — Patient Instructions (Addendum)
F/u in 3 month, call if you need m before  Flu vaccine today  You NEED to use oxygen all the time, to protect heart and lungs  You NEED to do breathing treatments three times every day for improved strength  Take all blood pressure medication prescribed at the same time every day, you need them,   You are doing better and that is good  Fasting lipid, cmp and cbc and tSH , and CBc in December , 2nd week

## 2013-12-27 ENCOUNTER — Other Ambulatory Visit: Payer: Self-pay | Admitting: Family Medicine

## 2013-12-30 ENCOUNTER — Other Ambulatory Visit: Payer: Self-pay | Admitting: Family Medicine

## 2013-12-31 ENCOUNTER — Telehealth: Payer: Self-pay | Admitting: Family Medicine

## 2013-12-31 NOTE — Telephone Encounter (Signed)
[name] told [name] that her stool was positive for blood and she was anemic when she called. I tried to call back to explain this further and there was no option to leave message. Will await phone call back

## 2013-12-31 NOTE — Telephone Encounter (Signed)
Daughter in law Margreta Journey called and stated that patient has an appointment at [facility] tomorrow and she has cancelled this and she wants to know why she has to be seen spoke with Velna Hatchet and she stated that patient has blood in stool told this to her and she sill is questioning this wants someone to call her and explain this to her she states that patient is living with her

## 2014-01-01 ENCOUNTER — Ambulatory Visit: Payer: Medicare Other | Admitting: Gastroenterology

## 2014-01-02 ENCOUNTER — Other Ambulatory Visit: Payer: Self-pay

## 2014-01-02 MED ORDER — ALBUTEROL SULFATE (2.5 MG/3ML) 0.083% IN NEBU: 2.5000 mg | INHALATION_SOLUTION | Freq: Three times a day (TID) | RESPIRATORY_TRACT | Status: DC

## 2014-01-05 ENCOUNTER — Other Ambulatory Visit: Payer: Self-pay | Admitting: Family Medicine

## 2014-01-06 ENCOUNTER — Telehealth: Payer: Self-pay | Admitting: *Deleted

## 2014-01-06 DIAGNOSIS — J449 Chronic obstructive pulmonary disease, unspecified: Secondary | ICD-10-CM

## 2014-01-06 NOTE — Telephone Encounter (Signed)
Pt's daughter called and LMOM that she would like to speak with a nurse about pt's breathing, pt's daughter is requesting a nurse call her back at [phone]

## 2014-01-07 NOTE — Addendum Note (Signed)
Addended by: Denman George B on: 01/07/2014 10:38 AM   Modules accepted: Orders

## 2014-01-07 NOTE — Telephone Encounter (Signed)
Patient is having more problems with labored breathing.   Is using nebs, inhalers, and oxygen as prescribed.  Can she be referred to [facility]. Or are there any other recommendations?

## 2014-01-07 NOTE — Telephone Encounter (Signed)
Referral entered  

## 2014-01-07 NOTE — Telephone Encounter (Signed)
pls do refeer her , I will sign

## 2014-01-08 ENCOUNTER — Other Ambulatory Visit: Payer: Self-pay

## 2014-01-08 MED ORDER — ALBUTEROL SULFATE HFA 108 (90 BASE) MCG/ACT IN AERS: 2.0000 | INHALATION_SPRAY | Freq: Four times a day (QID) | RESPIRATORY_TRACT | Status: DC | PRN

## 2014-01-16 ENCOUNTER — Ambulatory Visit (INDEPENDENT_AMBULATORY_CARE_PROVIDER_SITE_OTHER): Payer: Medicare Other | Admitting: Internal Medicine

## 2014-01-16 ENCOUNTER — Encounter: Payer: Self-pay | Admitting: Internal Medicine

## 2014-01-16 ENCOUNTER — Ambulatory Visit (INDEPENDENT_AMBULATORY_CARE_PROVIDER_SITE_OTHER)
Admission: RE | Admit: 2014-01-16 | Discharge: 2014-01-16 | Disposition: A | Discharge status: Home / Self Care | Payer: Medicare Other | Source: Ambulatory Visit | Attending: Internal Medicine | Admitting: Internal Medicine

## 2014-01-16 VITALS — BP 92/60 | HR 72 | Wt 90.0 lb

## 2014-01-16 DIAGNOSIS — I639 Cerebral infarction, unspecified: Secondary | ICD-10-CM

## 2014-01-16 DIAGNOSIS — J432 Centrilobular emphysema: Secondary | ICD-10-CM

## 2014-01-16 DIAGNOSIS — R0602 Shortness of breath: Secondary | ICD-10-CM | POA: Diagnosis not present

## 2014-01-16 DIAGNOSIS — I5032 Chronic diastolic (congestive) heart failure: Secondary | ICD-10-CM

## 2014-01-16 DIAGNOSIS — J449 Chronic obstructive pulmonary disease, unspecified: Secondary | ICD-10-CM

## 2014-01-16 MED ORDER — PREDNISONE 10 MG PO TABS: ORAL_TABLET | ORAL | Status: DC

## 2014-01-16 NOTE — Patient Instructions (Addendum)
Stop fish oil and inderol (propranolol)  Prednisone 10 mg take  4 each am x 2 days,   2 each am x 2 days,  1 each am x 2 days and stop   Plan A= Automatic Spiriva  Each am   Plan B=  Albuterol 2.5 mg up to 4 hours if needed if can't catch her breath  And stop the combivent   Prevacid 30  Mg Take 30-60 min before first meal of the day and pepcid 20 mg at bedtime  GERD (REFLUX)  is an extremely common cause of respiratory symptoms just like yours , many times with no obvious heartburn at all.    It can be treated with medication, but also with lifestyle changes including avoidance of late meals, excessive alcohol, smoking cessation, and avoid fatty foods, chocolate, peppermint, colas, red wine, and acidic juices such as orange juice.  NO MINT OR MENTHOL PRODUCTS SO NO COUGH DROPS  USE SUGARLESS CANDY INSTEAD (Jolley ranchers or Stover's or Life Savers) or even ice chips will also do - the key is to swallow to prevent all throat clearing. NO OIL BASED VITAMINS - use powdered substitutes.  Please remember to go to the   x-ray department downstairs for your tests - we will call you with the results when they are available.       See Tammy NP w/in 2 weeks with all your medications, even over the counter meds, separated in two separate bags, the ones you take no matter what vs the ones you stop once you feel better and take only as needed when you feel you need them.   Tammy  will generate for you a new user friendly medication calendar that will put [place] all on the same page re: your medication use.     Without this process, it simply isn't possible to assure that we are providing  your outpatient care  with  the attention to detail we feel you deserve.   If we cannot assure that you're getting that kind of care,  then we cannot manage your problem effectively from this clinic.  Once you have seen Tammy and we are sure that we're all on the same page with your medication use she will arrange  follow up with me ideally with pfts on return

## 2014-01-16 NOTE — Progress Notes (Signed)
Subjective:    Patient ID: [name], female    DOB: August 22, 1940,  MRN: [id number]  HPI  40 yowf quit smoking 2014 referred by Dr Bari Mantis for copd eval s/p admit [facility]  Admit date: 10/28/2013 Discharge date: 11/01/2013  Recommendations for Outpatient Follow-up:  1. Patient will be discharged to [facility] 2. Follow up with GI as an outpatient to further evaluate heme positive stools 3. Needs repeat CT chest with IV contrast in the next 6-12 months to evaluate 65mm right upper lobe lung nodule 4. Continue to check cbg 4 times a day, since patient does have episodes of hypoglycemia in AM that correct with oral intake. May benefit from having high calorie snack at bedtime.  Discharge Diagnoses:     AORTIC VALVE DISEASE  CHRONIC OBSTRUCTIVE PULMONARY DISEASE  Dehydration  Weakness  Nausea  Failure to thrive in adult  Protein-calorie malnutrition, severe  Cerebellar stroke  generalized weakness related to failure to thrive, malnutrition, dehydration and poor by mouth intake  Discharge Condition: stable  Diet recommendation: regular diet  Filed Weights   10/30/13 0447 10/31/13 0500 11/01/13 0504  Weight: 39.3 kg (86 lb 10.3 oz) 40.1 kg (88 lb 6.5 oz) 41.4 kg (91 lb 4.3 oz)    History of present illness and hospital course:  This patient was to the hospital with progressive weakness, nausea and failure to thrive. the patient has multiple medical problems including end-stage COPD, hypertension, dementia, aortic stenosis status post replacement and progressive weight loss. Her family felt that she recently had a similar presentation 4-5 months no as well as the dehydration. She is no longer able to care for herself at home. She was admitted to the hospital and started on intravenous hydration. She did not have any infectious source. She did not have any fevers or leukocytosis. The dehydration improved with IV fluids. Her weakness appears to be related to  deconditioning, decreased by mouth intake and failure to thrive. She was seen by physical therapy who recommended skilled nursing facility placement  MRI of the brain was ordered to further evaluate generalized weakness. She doesn't have a small cerebellar infarct. Further stroke workup was unremarkable including carotid Dopplers and 2-D echocardiogram. She was seen by neurology who felt that she should be on daily aspirin, since she has not been taking this regularly. Lipid panel indicated an LDL less than 100.   She was also noted to have heme-positive stools, was having brown colored stools, and stable hemoglobin. She was placed on a proton pump inhibitor. Case was discussed with Dr. Oneida Alar, gastroenterology who will follow patient up in the outpatient setting to discuss further workup.  The patient was noted to have intermittent episodes of hypoglycemia, mostly in the morning. These episodes would correct with by mouth intake. Recommended to continue to check CBGs periodically during the day. She may benefit from a high calorie snack at bedtime.  Patient also had CT scan of the chest done which indicated a right upper lobe nodule which was new. This will need to be followed up with repeat CT in 6-12 months. Previously seen pulmonary nodules appear to be stable.  Her COPD is quite advanced. She is chronically on 2 L of oxygen. She's was continued on bronchodilators. Air entry appears to be fair bilaterally. She does have mildly increased work of breathing which may be her baseline. She will be placed on a short course of prednisone which should help her breathing and may also improve her appetite.  Procedures: Echo: Overall limited images. There is mild LVH with LVEF approximately 55-73%, grade 1 diastolic dysfunction with increased filling pressures. Bioprosthetic aortic valve is poorly visualized, no obvious aortic regurgitation or perivalvular leak, no gradients reported. Trivial tricuspid  regurgitation, unable to assess PASP   Consultations:  Neurology     01/16/2014 1st [facility] office visit/ [name]  / "maint rx" with spiriva and neb ? Albuterol plus combivent but not able to activate  Chief Complaint  Patient presents with  . Pulmonary Consult    Referred by Dr. Moshe Cipro. Pt has been dxed with COPD. She c/o having panic attacks in the night due to SOB for the past month.  Her cough is non prod.   did better enough p 20 days of SNF for d/c home  and was sob  room - room intially for a week or so then bed to chair ever since and then started having noct spells of sob, better for the week prior to ov/ pt says legs are what stops her from walking / fm not so sure. Cough is sporadic/ dry  Not sure she really benefits at all from meds listed above   No obvious other patterns in day to day or daytime variabilty or assoc   cp or chest tightness, subjective wheeze overt sinus or hb symptoms. No unusual exp hx or h/o childhood pna/ asthma or knowledge of premature birth.  Sleeping ok without nocturnal  or early am exacerbation  of respiratory  c/o's or need for noct saba. Also denies any obvious fluctuation of symptoms with weather or environmental changes or other aggravating or alleviating factors except as outlined above   Current Medications, Allergies, Complete Past Medical History, Past Surgical History, Family History, and Social History were reviewed in [employer] record.               Review of Systems  Constitutional: Negative for fever, chills and unexpected weight change.  HENT: Negative for congestion, dental problem, ear pain, nosebleeds, postnasal drip, rhinorrhea, sinus pressure, sneezing, sore throat, trouble swallowing and voice change.   Eyes: Negative for visual disturbance.  Respiratory: Positive for cough and shortness of breath. Negative for choking.   Cardiovascular: Negative for chest pain and leg swelling.    Gastrointestinal: Negative for vomiting, abdominal pain and diarrhea.  Genitourinary: Negative for difficulty urinating.  Musculoskeletal: Negative for arthralgias.  Skin: Negative for rash.  Neurological: Negative for tremors, syncope and headaches.  Hematological: Does not bruise/bleed easily.       Objective:   Physical Exam  Chronically ill elderly wf w/c bound nad on 02  Wt Readings from Last 3 Encounters:  01/16/14 90 lb (40.824 kg)  12/25/13 90 lb 1.3 oz (40.86 kg)  11/01/13 91 lb 4.3 oz (41.4 kg)    Vital signs reviewed  HEENT mild turbinate edema.  Oropharynx no thrush or excess pnd or cobblestoning.  No JVD or cervical adenopathy. Mild accessory muscle hypertrophy. Trachea midline, nl thryroid. Chest was hyperinflated by percussion with diminished breath sounds and moderate increased exp time without wheeze. Hoover sign positive at mid inspiration. Regular rate and rhythm without murmur gallop or rub or increase P2 or edema.  Abd: no hsm, nl excursion. Ext warm without cyanosis or clubbing.    cxr 01/16/14  COPD. There is no evidence of pneumonia, CHF, nor other acute cardiopulmonary abnormality.      Assessment & Plan:

## 2014-01-18 NOTE — Assessment & Plan Note (Signed)
Echo 06/29/5394 Systolic function was vigorous. The estimated ejection fraction was in the range of 65% to 70%. Doppler parameters are consistent with abnormal left ventricular relaxation (grade 1 diastolic dysfunction). Doppler parameters are consistent with elevated ventricular end-diastolic filling pressure. - Aortic valve: A bioprosthesis was present - 21 mm porcine valve based on records. There was no significant regurgitation. Overall poorly visualized, no gradients are reported. There is no obvious perivalvular leak. - Mitral valve: Calcified annulus. There was trivial regurgitation. - Right atrium: Central venous pressure (est): 3 mm Hg.  Her bp is very low today so will hold propranolol with rec add bisoprolol 2.5 mg daily if BB needed (or low dose calan/diltiazem also reasonable)

## 2014-01-18 NOTE — Assessment & Plan Note (Signed)
DDX of  difficult airways management all start with A and  include Adherence, Ace Inhibitors, Acid Reflux, Active Sinus Disease, Alpha 1 Antitripsin deficiency, Anxiety masquerading as Airways dz,  ABPA,  allergy(esp in young), Aspiration (esp in elderly), Adverse effects of DPI,  Active smokers, plus two Bs  = Bronchiectasis and Beta blocker use..and one C= CHF   Adherence is always the initial "prime suspect" and is a multilayered concern that requires a "trust but verify" approach in every patient - starting with knowing how to use medications, especially inhalers, correctly, keeping up with refills and understanding the fundamental difference between maintenance and prns vs those medications only taken for a very short course and then stopped and not refilled.  - The proper method of use, as well as anticipated side effects, of a metered-dose inhaler are discussed and demonstrated to the patient. Improved effectiveness after extensive coaching during this visit to a level of approximately  0% so should avoid hfa / respirmat and just use spriviva handhaler and prn saba neb and if needed at next of add laba/ics neb  ? Acid (or non-acid) GERD > always difficult to exclude as up to 75% of pts in some series report no assoc GI/ Heartburn symptoms> rec max (24h)  acid suppression and diet restrictions/ reviewed and instructions given in writing.  ? Anxiety > not sure what's causing her resting sob but this should be high on ddx  ? BBeffect > propranolol even in low doses should be avoided and replaced with bisoprolol if needed   ? CHF  Lab Results  Component Value Date   PROBNP 669.3* 10/28/2013   last echo 10/29/13 ok

## 2014-01-19 NOTE — Progress Notes (Signed)
Quick Note:  Spoke with pt and notified of results per Dr. Wert. Pt verbalized understanding and denied any questions.  ______ 

## 2014-01-19 NOTE — Progress Notes (Signed)
Quick Note:  LMTCB ______ 

## 2014-01-26 ENCOUNTER — Other Ambulatory Visit: Payer: Self-pay

## 2014-01-26 ENCOUNTER — Telehealth: Payer: Self-pay

## 2014-01-26 ENCOUNTER — Encounter: Payer: Self-pay | Admitting: Family Medicine

## 2014-01-26 ENCOUNTER — Telehealth: Payer: Self-pay | Admitting: Family Medicine

## 2014-01-26 DIAGNOSIS — Z23 Encounter for immunization: Secondary | ICD-10-CM | POA: Insufficient documentation

## 2014-01-26 DIAGNOSIS — R195 Other fecal abnormalities: Secondary | ICD-10-CM | POA: Insufficient documentation

## 2014-01-26 MED ORDER — ALPRAZOLAM 0.25 MG PO TABS: 0.2500 mg | ORAL_TABLET | Freq: Every evening | ORAL | Status: DC | PRN

## 2014-01-26 NOTE — Telephone Encounter (Signed)
Called and left message to call back.

## 2014-01-26 NOTE — Assessment & Plan Note (Addendum)
Pt reportedly has had no behavioral disturbance since relocating to new living environ. Should take aricept 5mg  daily , unclear as to whethyer she has ee doing thois since recent d/c from [facility], but is strongly encouraged to do so

## 2014-01-26 NOTE — Progress Notes (Signed)
Subjective:    Patient ID: [name], female    DOB: 1940-11-20, 73 y.o.   MRN: [id number]  HPI Pt in for review following recent nursing home stay for in patoient rehab and strengthening following hospitalization fior progressive weakness, , cachexia, dehydration,  And cerebellar stroke. F/U of lung nodule by [facility] nin 2016 as well as furhter eval oif heme positive stool were recommneded Son and daughter in law are just "getting accustomed to" caring for pt , sttae they have reduced xanax use and are actively encouraging her to "push herself" aas much as safely able , to improve overall health She is actively encouraged as afar as eating is concerned, and has ahd no falls since going home    Review of Systems See HPI Denies recent fever or chills.c/o chronic fatigue and shortness of breath with activity Denies sinus pressure, nasal congestion, ear pain or sore throat. Chronic  chest congestion, productive cough and  wheezing. Denies chest pains, palpitations and leg swelling, denies PND oroorthopnea Denies abdominal pain, nausea, vomiting,diarrhea or constipation.   Denies dysuria, frequency, hesitancy or incontinence. Denies joint pain, does have  limitation in mobility.mainly due to poor exercise tolerance Denies headaches, seizures, numbness, or tingling. Denies  Uncontrolled depression, anxiety or insomnia. Denies skin break down or rash.        Objective:   Physical Exam  BP 130/80 mmHg  Pulse 70  Resp 18  Ht 5\' 1"  (1.549 m)  Wt 90 lb 1.3 oz (40.86 kg)  BMI 17.03 kg/m2  SpO2 94% Patient alert and oriented and in no cardiopulmonary distress.Chronically ill appearing, underweight   HEENT: No facial asymmetry, EOMI,   oropharynx pink and moist.  Neck decreased, though adequate  ROM, no JVD, no mass.No thyromegaly  Chest: decreased though adequate air entry throughout, scattered crackles and  wheezes  CVS: S1, S2 systolic murmur, no S3.Regular rate.  ABD: Soft  non tender.   Ext: No edema  MS: decreased  ROM spine, shoulders, hips and knees.generalized muscle wasting  Skin: Intact, no ulcerations or rash noted.  Psych: Good eye contact,flat  affect. Memorymildly impaired, not anxious but  depressed appearing.  CNS: CN 2-12 intact, power,  normal throughout.no focal deficits noted.decreased tone        Assessment & Plan:  Essential hypertension, benign Controlled, no change in medication Salt restriction encouraged  Dementia with behavioral disturbance Pt reportedly has had no behavioral disturbance since relocating to new living environ. Should take aricept 5mg  daily , unclear as to whethyer she has ee doing thois since recent d/c from [facility], but is strongly encouraged to do so  Depression with anxiety denies symptoms of depression or uncontrolled anxiety. Living environ is new, now with her son, and daughter in law is in charge of her care Unable to fully asses mental state at this visit, will look into this some more at next visit Currently takes xanax at bedtime only, which is markedly less than in the past  Insomnia secondary to depression with anxiety Sleep hygiene reviewed and written information offered also. Prescription sent for  medication needed.   Hyperlipemia Controlled, no change in medication.  Hyperlipidemia:Low fat diet discussed and encouraged.     Lung nodule rept chest scan with contrast recommended for 03 to 10/2014 Longstanding g/o nicotine use  Cerebellar stroke Recently d/c "home " from rehab at SNF, now living with her son, cared for by daughter in law, actively engaged in trytinng to commit to more physical  activity in a safe environment  Heme positive stool Noted during hospitalization in sept, needs GI follow up, will refer and rept cbc if needed  Need for prophylactic vaccination and inoculation against influenza Vaccine administered at visit.

## 2014-01-26 NOTE — Assessment & Plan Note (Signed)
Controlled, no change in medication Salt restriction encouraged

## 2014-01-26 NOTE — Assessment & Plan Note (Signed)
Controlled, no change in medication Hyperlipidemia:Low fat diet discussed and encouraged.  \ 

## 2014-01-26 NOTE — Telephone Encounter (Signed)
Pls send in script one at bedtime #30 refill 2 please

## 2014-01-26 NOTE — Assessment & Plan Note (Signed)
Vaccine administered at visit.  

## 2014-01-26 NOTE — Telephone Encounter (Signed)
They are only wanting it for bedtime use. They are out of the ones [facility] gave

## 2014-01-26 NOTE — Telephone Encounter (Signed)
Message left that med would be sent in today

## 2014-01-26 NOTE — Assessment & Plan Note (Signed)
Sleep hygiene reviewed and written information offered also. Prescription sent for  medication needed.  

## 2014-01-26 NOTE — Telephone Encounter (Signed)
Pls call and remind pt and caregiver that she has labs ordered for 2nd week i n dec, also , during recent hospitalization, GI eval of heme pos stool was recommended, I have entered a referral for [facility] about this, just so they are aware why she is being asked to go in when called

## 2014-01-26 NOTE — Telephone Encounter (Signed)
Med refilled.

## 2014-01-26 NOTE — Assessment & Plan Note (Addendum)
denies symptoms of depression or uncontrolled anxiety. Living environ is new, now with her son, and daughter in law is in charge of her care Unable to fully asses mental state at this visit, will look into this some more at next visit Currently takes xanax at bedtime only, which is markedly less than in the past

## 2014-01-26 NOTE — Assessment & Plan Note (Signed)
Noted during hospitalization in sept, needs GI follow up, will refer and rept cbc if needed

## 2014-01-26 NOTE — Telephone Encounter (Signed)
Called wanting refill on the xanax. Was originally prescribed by [facility]. The rx is for 0.25mg  1 q 6 hrs prn but they only give it to her at bedtime. Please advise

## 2014-01-26 NOTE — Assessment & Plan Note (Signed)
Recently d/c "home " from rehab at SNF, now living with her son, cared for by daughter in law, actively engaged in trytinng to commit to more physical activity in a safe environment

## 2014-01-26 NOTE — Telephone Encounter (Signed)
Will need to remind pt that when last seen here, the caregiver specifically stated only bedtime use needed. Pls ask pt if she feels stressed and that there are other med options for anxiety with less fall risk etc

## 2014-01-26 NOTE — Assessment & Plan Note (Signed)
rept chest scan with contrast recommended for 03 to 10/2014 Longstanding g/o nicotine use

## 2014-01-27 ENCOUNTER — Other Ambulatory Visit: Payer: Self-pay | Admitting: Family Medicine

## 2014-01-27 DIAGNOSIS — D649 Anemia, unspecified: Secondary | ICD-10-CM | POA: Diagnosis not present

## 2014-01-27 DIAGNOSIS — E785 Hyperlipidemia, unspecified: Secondary | ICD-10-CM | POA: Diagnosis not present

## 2014-01-27 DIAGNOSIS — E559 Vitamin D deficiency, unspecified: Secondary | ICD-10-CM | POA: Diagnosis not present

## 2014-01-27 DIAGNOSIS — R7301 Impaired fasting glucose: Secondary | ICD-10-CM | POA: Diagnosis not present

## 2014-01-27 DIAGNOSIS — I10 Essential (primary) hypertension: Secondary | ICD-10-CM | POA: Diagnosis not present

## 2014-01-27 LAB — LIPID PANEL
CHOL/HDL RATIO: 2.9 ratio
CHOLESTEROL: 209 mg/dL — AB (ref 0–200)
HDL: 73 mg/dL (ref >39)
LDL CALC: 121 mg/dL — AB (ref 0–99)
TRIGLYCERIDES: 76 mg/dL (ref <150)
VLDL: 15 mg/dL (ref 0–40)

## 2014-01-27 LAB — COMPREHENSIVE METABOLIC PANEL
ALK PHOS: 38 U/L — AB (ref 39–117)
ALT: 10 U/L (ref 0–35)
AST: 18 U/L (ref 0–37)
Albumin: 3.3 g/dL — ABNORMAL LOW (ref 3.5–5.2)
BILIRUBIN TOTAL: 0.3 mg/dL (ref 0.2–1.2)
BUN: 22 mg/dL (ref 6–23)
CO2: 30 mEq/L (ref 19–32)
CREATININE: 0.89 mg/dL (ref 0.50–1.10)
Calcium: 9.7 mg/dL (ref 8.4–10.5)
Chloride: 105 mEq/L (ref 96–112)
GLUCOSE: 84 mg/dL (ref 70–99)
Potassium: 4.7 mEq/L (ref 3.5–5.3)
Sodium: 142 mEq/L (ref 135–145)
TOTAL PROTEIN: 5.9 g/dL — AB (ref 6.0–8.3)

## 2014-01-27 LAB — CBC
HEMATOCRIT: 30.3 % — AB (ref 36.0–46.0)
HEMOGLOBIN: 9.9 g/dL — AB (ref 12.0–15.0)
MCH: 28.9 pg (ref 26.0–34.0)
MCHC: 32.7 g/dL (ref 30.0–36.0)
MCV: 88.6 fL (ref 78.0–100.0)
Platelets: 413 10*3/uL — ABNORMAL HIGH (ref 150–400)
RBC: 3.42 MIL/uL — ABNORMAL LOW (ref 3.87–5.11)
RDW: 14.1 % (ref 11.5–15.5)
WBC: 6.5 10*3/uL (ref 4.0–10.5)

## 2014-01-27 LAB — TSH: TSH: 0.572 u[IU]/mL (ref 0.350–4.500)

## 2014-01-27 NOTE — Telephone Encounter (Signed)
They are doing the labs now and they cancelled the GI appt because they didn't feel it was "needed" They said you can do another hemocult test at her Feb appt. They are refusing endo's and colonoscopy so they don't see where a GI referral would be needed.

## 2014-01-27 NOTE — Telephone Encounter (Signed)
noted 

## 2014-01-28 LAB — VITAMIN D 25 HYDROXY (VIT D DEFICIENCY, FRACTURES): Vit D, 25-Hydroxy: 26 ng/mL — ABNORMAL LOW (ref 30–100)

## 2014-01-29 ENCOUNTER — Encounter: Payer: Self-pay | Admitting: Adult Health

## 2014-01-29 ENCOUNTER — Ambulatory Visit (INDEPENDENT_AMBULATORY_CARE_PROVIDER_SITE_OTHER): Payer: Medicare Other | Admitting: Adult Health

## 2014-01-29 VITALS — BP 110/68 | HR 78 | Temp 98.5°F | Wt 91.8 lb

## 2014-01-29 DIAGNOSIS — J449 Chronic obstructive pulmonary disease, unspecified: Secondary | ICD-10-CM | POA: Diagnosis not present

## 2014-01-29 DIAGNOSIS — I10 Essential (primary) hypertension: Secondary | ICD-10-CM | POA: Diagnosis not present

## 2014-01-29 NOTE — Assessment & Plan Note (Signed)
Controlled on rx  No flare of tachycardia or b/p off propranolol  If BB is needed , could consider bisoprolol  Avoid nonselective BB if possible with underlying lung dz.

## 2014-01-29 NOTE — Patient Instructions (Addendum)
Follow med calendar closely and bring to each visit Continue on current regimen Follow Dr. Melvyn Novas in 2 months with PFT  and as needed

## 2014-01-29 NOTE — Assessment & Plan Note (Signed)
Recent flare now resolving  Patient's medications were reviewed today and patient education was given. Computerized medication calendar was adjusted/completed   Plan  Follow med calendar closely and bring to each visit Continue on current regimen Follow Dr. Melvyn Novas in 2 months with PFT  and as needed

## 2014-01-29 NOTE — Progress Notes (Signed)
Subjective:    Patient ID: [name], female    DOB: [birth year],  MRN: [id number]  HPI  57 yowf quit smoking 2014 referred by Dr Bari Mantis for copd eval s/p admit [facility]  Admit date: 10/28/2013 Discharge date: 11/01/2013  Recommendations for Outpatient Follow-up:  1. Patient will be discharged to [facility] 2. Follow up with GI as an outpatient to further evaluate heme positive stools 3. Needs repeat CT chest with IV contrast in the next 6-12 months to evaluate 106mm right upper lobe lung nodule 4. Continue to check cbg 4 times a day, since patient does have episodes of hypoglycemia in AM that correct with oral intake. May benefit from having high calorie snack at bedtime.  Discharge Diagnoses:     AORTIC VALVE DISEASE  CHRONIC OBSTRUCTIVE PULMONARY DISEASE  Dehydration  Weakness  Nausea  Failure to thrive in adult  Protein-calorie malnutrition, severe  Cerebellar stroke  generalized weakness related to failure to thrive, malnutrition, dehydration and poor by mouth intake  Discharge Condition: stable  Diet recommendation: regular diet  Filed Weights   10/30/13 0447 10/31/13 0500 11/01/13 0504  Weight: 39.3 kg (86 lb 10.3 oz) 40.1 kg (88 lb 6.5 oz) 41.4 kg (91 lb 4.3 oz)    History of present illness and hospital course:  This patient was to the hospital with progressive weakness, nausea and failure to thrive. the patient has multiple medical problems including end-stage COPD, hypertension, dementia, aortic stenosis status post replacement and progressive weight loss. Her family felt that she recently had a similar presentation 4-5 months no as well as the dehydration. She is no longer able to care for herself at home. She was admitted to the hospital and started on intravenous hydration. She did not have any infectious source. She did not have any fevers or leukocytosis. The dehydration improved with IV fluids. Her weakness appears to be related to  deconditioning, decreased by mouth intake and failure to thrive. She was seen by physical therapy who recommended skilled nursing facility placement  MRI of the brain was ordered to further evaluate generalized weakness. She doesn't have a small cerebellar infarct. Further stroke workup was unremarkable including carotid Dopplers and 2-D echocardiogram. She was seen by neurology who felt that she should be on daily aspirin, since she has not been taking this regularly. Lipid panel indicated an LDL less than 100.   She was also noted to have heme-positive stools, was having brown colored stools, and stable hemoglobin. She was placed on a proton pump inhibitor. Case was discussed with Dr. Oneida Alar, gastroenterology who will follow patient up in the outpatient setting to discuss further workup.  The patient was noted to have intermittent episodes of hypoglycemia, mostly in the morning. These episodes would correct with by mouth intake. Recommended to continue to check CBGs periodically during the day. She may benefit from a high calorie snack at bedtime.  Patient also had CT scan of the chest done which indicated a right upper lobe nodule which was new. This will need to be followed up with repeat CT in 6-12 months. Previously seen pulmonary nodules appear to be stable.  Her COPD is quite advanced. She is chronically on 2 L of oxygen. She's was continued on bronchodilators. Air entry appears to be fair bilaterally. She does have mildly increased work of breathing which may be her baseline. She will be placed on a short course of prednisone which should help her breathing and may also improve her appetite.  Procedures: Echo: Overall limited images. There is mild LVH with LVEF approximately 93-79%, grade 1 diastolic dysfunction with increased filling pressures. Bioprosthetic aortic valve is poorly visualized, no obvious aortic regurgitation or perivalvular leak, no gradients reported. Trivial tricuspid  regurgitation, unable to assess PASP   Consultations:  Neurology     01/16/2014 1st [facility] office visit/ Wert  / "maint rx" with spiriva and neb ? Albuterol plus combivent but not able to activate  Chief Complaint  Patient presents with  . Pulmonary Consult    Referred by Dr. Moshe Cipro. Pt has been dxed with COPD. She c/o having panic attacks in the night due to SOB for the past month.  Her cough is non prod.   did better enough p 20 days of SNF for d/c home  and was sob  room - room intially for a week or so then bed to chair ever since and then started having noct spells of sob, better for the week prior to ov/ pt says legs are what stops her from walking / fm not so sure. Cough is sporadic/ dry  Not sure she really benefits at all from meds listed above  >steroid taper   01/29/2014 Follow up and medication review Patient was seen last visit for a pulmonary consult for shortness of breath and COPD She was treated with a steroid taper. Her propanolol was discontinued Patient returns today feeling much improved with decreased cough, shortness of breath and wheezing Her daughter accompanies her today. She helps her with her medications We reviewed all her medications organize them into a medication calendar with patient education Appears that she is taking her medications correctly.  She denies any chest pain, orthopnea, PND, or increased leg swelling.  Current Medications, Allergies, Complete Past Medical History, Past Surgical History, Family History, and Social History were reviewed in [employer] record.               Review of Systems  Constitutional: Negative for fever, chills and unexpected weight change.  HENT: Negative for congestion, dental problem, ear pain, nosebleeds, postnasal drip, rhinorrhea, sinus pressure, sneezing, sore throat, trouble swallowing and voice change.   Eyes: Negative for visual disturbance.  Respiratory:  .  Negative for choking.   Cardiovascular: Negative for chest pain and leg swelling.  Gastrointestinal: Negative for vomiting, abdominal pain and diarrhea.  Genitourinary: Negative for difficulty urinating.  Musculoskeletal: Negative for arthralgias.  Skin: Negative for rash.  Neurological: Negative for tremors, syncope and headaches.  Hematological: Does not bruise/bleed easily.       Objective:   Physical Exam  Chronically ill elderly wf w/c bound nad on 02   Vital signs reviewed  HEENT mild turbinate edema.  Oropharynx no thrush or excess pnd or cobblestoning.  No JVD or cervical adenopathy. Mild accessory muscle hypertrophy. Trachea midline, nl thryroid. Chest was hyperinflated by percussion with diminished breath sounds and moderate increased exp time without wheeze. Hoover sign positive at mid inspiration. Regular rate and rhythm without murmur gallop or rub or increase P2 or edema.  Abd: no hsm, nl excursion. Ext warm without cyanosis or clubbing.    cxr 01/16/14  COPD. There is no evidence of pneumonia, CHF, nor other acute cardiopulmonary abnormality.      Assessment & Plan:

## 2014-01-30 LAB — IRON: Iron: 30 ug/dL — ABNORMAL LOW (ref 42–145)

## 2014-01-30 LAB — FERRITIN: FERRITIN: 22 ng/mL (ref 10–291)

## 2014-02-10 ENCOUNTER — Other Ambulatory Visit: Payer: Self-pay

## 2014-02-10 MED ORDER — VITAMIN D (ERGOCALCIFEROL) 1.25 MG (50000 UNIT) PO CAPS: 50000.0000 [IU] | ORAL_CAPSULE | ORAL | Status: DC

## 2014-02-10 NOTE — Addendum Note (Signed)
Addended by: Parke Poisson E on: 02/10/2014 04:51 PM   Modules accepted: Orders, Medications

## 2014-02-18 ENCOUNTER — Other Ambulatory Visit: Payer: Self-pay

## 2014-02-18 MED ORDER — SERTRALINE HCL 50 MG PO TABS: 50.0000 mg | ORAL_TABLET | Freq: Every day | ORAL | Status: DC

## 2014-03-17 ENCOUNTER — Other Ambulatory Visit: Payer: Self-pay | Admitting: Family Medicine

## 2014-03-30 ENCOUNTER — Encounter: Payer: Self-pay | Admitting: Family Medicine

## 2014-03-30 ENCOUNTER — Ambulatory Visit (INDEPENDENT_AMBULATORY_CARE_PROVIDER_SITE_OTHER): Payer: Medicare Other | Admitting: Family Medicine

## 2014-03-30 VITALS — BP 110/60 | HR 78 | Resp 16 | Ht 61.0 in | Wt 99.0 lb

## 2014-03-30 DIAGNOSIS — Z23 Encounter for immunization: Secondary | ICD-10-CM | POA: Diagnosis not present

## 2014-03-30 DIAGNOSIS — D539 Nutritional anemia, unspecified: Secondary | ICD-10-CM

## 2014-03-30 DIAGNOSIS — E43 Unspecified severe protein-calorie malnutrition: Secondary | ICD-10-CM | POA: Diagnosis not present

## 2014-03-30 DIAGNOSIS — I10 Essential (primary) hypertension: Secondary | ICD-10-CM

## 2014-03-30 DIAGNOSIS — F418 Other specified anxiety disorders: Secondary | ICD-10-CM | POA: Diagnosis not present

## 2014-03-30 DIAGNOSIS — E559 Vitamin D deficiency, unspecified: Secondary | ICD-10-CM | POA: Diagnosis not present

## 2014-03-30 DIAGNOSIS — R7301 Impaired fasting glucose: Secondary | ICD-10-CM | POA: Diagnosis not present

## 2014-03-30 DIAGNOSIS — E785 Hyperlipidemia, unspecified: Secondary | ICD-10-CM | POA: Diagnosis not present

## 2014-03-30 DIAGNOSIS — F0391 Unspecified dementia with behavioral disturbance: Secondary | ICD-10-CM | POA: Diagnosis not present

## 2014-03-30 DIAGNOSIS — E0590 Thyrotoxicosis, unspecified without thyrotoxic crisis or storm: Secondary | ICD-10-CM | POA: Diagnosis not present

## 2014-03-30 DIAGNOSIS — F03918 Unspecified dementia, unspecified severity, with other behavioral disturbance: Secondary | ICD-10-CM

## 2014-03-30 DIAGNOSIS — J449 Chronic obstructive pulmonary disease, unspecified: Secondary | ICD-10-CM | POA: Diagnosis not present

## 2014-03-30 NOTE — Patient Instructions (Signed)
F/u in 4 month, call if you need me before  You are doing much better, and you look very well   Prevnar today  Fasting lipid, cmp and EGFr, TSH, cBC, iron and ferritin and vit D, CBC in 4 month  INCREASE aricept to treating dose of 86m TWO every day, if you are able to tolerate pls call nurse in 1 week so that a new script will be sent for aricept 178mone daily, which is the standard dose  You will be referred for heart evaluation after next visit to follow up heart valves, you have not been for 4 years

## 2014-03-30 NOTE — Progress Notes (Signed)
   Subjective:    Patient ID: [name], female    DOB: 03/17/1940, 74 y.o.   MRN: [id number]  HPI  The PT is here for follow up and re-evaluation of chronic medical conditions, medication management and review of any available recent lab and radiology data.  Preventive health is updated, specifically  Cancer screening and Immunization.   Questions or concerns regarding consultations or procedures which the PT has had in the interim are  Addressed.Has improved as far as breathing is concerned, did see pulmonary The PT denies any adverse reactions to current medications since the last visit. Using less meds than in the past There are no new concerns.  There are no specific complaints      Review of Systems See HPI Denies recent fever or chills. Denies sinus pressure, nasal congestion, ear pain or sore throat. Denies chest congestion, productive cough does have intermittent wheezing. Denies chest pains, palpitations and leg swelling Denies abdominal pain, nausea, vomiting,diarrhea or constipation.   Denies dc/o chronic  joint pain, swelling and limitation in mobility. Denies headaches, seizures, numbness, or tingling. Denies uncontrolled  depression, anxiety or insomnia. Denies skin break down or rash.        Objective:   Physical Exam BP 110/60 mmHg  Pulse 78  Resp 16  Ht 5\' 1"  (1.549 m)  Wt 99 lb (44.906 kg)  BMI 18.72 kg/m2  SpO2 93% Patient alert and oriented and in no cardiopulmonary distress.  HEENT: No facial asymmetry, EOMI,   oropharynx pink and moist.  Neck supple no JVD, no mass.  Chest: .Decraesed air entry throughout, no crackles ,few wheezes  CVS: S1, S2 , systolic  murmus, no S3.Regular rate.  ABD: Soft non tender.   Ext: No edema  JX:[id number]  ROM spine, shoulders, hips and knees.  Skin: Intact, no ulcerations or rash noted.  Psych: Good eye contact, normal affect. Memory impaired, mildly  anxious not  depressed appearing.  CNS: CN 2-12  intact, power,  normal throughout.no focal deficits noted.        Assessment & Plan:  Essential hypertension, benign Controlled, no change in medication    COPD GOLD III with reversibility  Marked improveme nt on current regime has been evaluated by pulmonary   UNSPECIFIED DISORDER OF THYROID Stable currently, needs to be re evaluated by endo who was treating her in the past   Depression with anxiety Improved greatly since now living with ehjr son, daughter in law cares for her   Need for vaccination with 13-polyvalent pneumococcal conjugate vaccine Vaccine administered at visit.    Protein-calorie malnutrition, severe Improved since last visit, pt encouraged to continue current eating habits   Hyperlipemia Deteriorated  Hyperlipidemia:Low fat diet discussed and encouraged.  Updated lab needed at/ before next visit.    Dementia with behavioral disturbance Titrate arcicept to std dose, call back if intolerant

## 2014-03-31 ENCOUNTER — Other Ambulatory Visit: Payer: Self-pay | Admitting: Internal Medicine

## 2014-03-31 DIAGNOSIS — R0600 Dyspnea, unspecified: Secondary | ICD-10-CM

## 2014-04-01 ENCOUNTER — Encounter: Payer: Self-pay | Admitting: Internal Medicine

## 2014-04-01 ENCOUNTER — Ambulatory Visit (INDEPENDENT_AMBULATORY_CARE_PROVIDER_SITE_OTHER): Payer: Medicare Other | Admitting: Internal Medicine

## 2014-04-01 VITALS — BP 132/68 | HR 84 | Ht 61.0 in | Wt 99.0 lb

## 2014-04-01 DIAGNOSIS — J449 Chronic obstructive pulmonary disease, unspecified: Secondary | ICD-10-CM

## 2014-04-01 DIAGNOSIS — R0600 Dyspnea, unspecified: Secondary | ICD-10-CM

## 2014-04-01 LAB — PULMONARY FUNCTION TEST
DL/VA % pred: 59 %
DL/VA: 2.62 ml/min/mmHg/L
DLCO UNC: 6.66 ml/min/mmHg
DLCO unc % pred: 32 %
FEF 25-75 POST: 0.45 L/s
FEF 25-75 Pre: 0.25 L/sec
FEF2575-%Change-Post: 80 %
FEF2575-%Pred-Post: 28 %
FEF2575-%Pred-Pre: 15 %
FEV1-%CHANGE-POST: 29 %
FEV1-%Pred-Post: 40 %
FEV1-%Pred-Pre: 31 %
FEV1-Post: 0.79 L
FEV1-Pre: 0.61 L
FEV1FVC-%Change-Post: 6 %
FEV1FVC-%PRED-PRE: 63 %
FEV6-%CHANGE-POST: 20 %
FEV6-%PRED-POST: 61 %
FEV6-%Pred-Pre: 50 %
FEV6-Post: 1.49 L
FEV6-Pre: 1.24 L
FEV6FVC-%Change-Post: 0 %
FEV6FVC-%Pred-Post: 101 %
FEV6FVC-%Pred-Pre: 101 %
FVC-%Change-Post: 21 %
FVC-%Pred-Post: 60 %
FVC-%Pred-Pre: 50 %
FVC-POST: 1.55 L
FVC-Pre: 1.28 L
Post FEV1/FVC ratio: 51 %
Post FEV6/FVC ratio: 96 %
Pre FEV1/FVC ratio: 47 %
Pre FEV6/FVC Ratio: 96 %

## 2014-04-01 NOTE — Progress Notes (Signed)
PFT done today. 

## 2014-04-01 NOTE — Progress Notes (Signed)
Subjective:    Patient ID: [name], female    DOB: 10/25/1940,  MRN: [id number]    Brief patient profile:  54 yowf quit smoking 2014 referred by [name] for copd eval s/p admit [facility] with GOLD III copd documented 04/05/2014   Admit date: 10/28/2013 Discharge date: 11/01/2013    Discharge Diagnoses:     AORTIC VALVE DISEASE  CHRONIC OBSTRUCTIVE PULMONARY DISEASE  Dehydration  Weakness  Nausea  Failure to thrive in adult  Protein-calorie malnutrition, severe  Cerebellar stroke  generalized weakness related to failure to thrive, malnutrition, dehydration and poor by mouth intake    History of present illness and hospital course:  This patient was to the hospital with progressive weakness, nausea and failure to thrive. the patient has multiple medical problems including end-stage COPD, hypertension, dementia, aortic stenosis status post replacement and progressive weight loss. Her family felt that she recently had a similar presentation 4-5 months no as well as the dehydration. She is no longer able to care for herself at home. She was admitted to the hospital and started on intravenous hydration. She did not have any infectious source. She did not have any fevers or leukocytosis. The dehydration improved with IV fluids. Her weakness appears to be related to deconditioning, decreased by mouth intake and failure to thrive. She was seen by physical therapy who recommended skilled nursing facility placement  MRI of the brain was ordered to further evaluate generalized weakness. She doesn't have a small cerebellar infarct. Further stroke workup was unremarkable including carotid Dopplers and 2-D echocardiogram. She was seen by neurology who felt that she should be on daily aspirin, since she has not been taking this regularly. Lipid panel indicated an LDL less than 100.   She was also noted to have heme-positive stools, was having brown colored stools, and stable hemoglobin. She  was placed on a proton pump inhibitor. Case was discussed with [name], gastroenterology who will follow patient up in the outpatient setting to discuss further workup.  The patient was noted to have intermittent episodes of hypoglycemia, mostly in the morning. These episodes would correct with by mouth intake. Recommended to continue to check CBGs periodically during the day. She may benefit from a high calorie snack at bedtime.  Patient also had CT scan of the chest done which indicated a right upper lobe nodule which was new. This will need to be followed up with repeat CT in 6-12 months. Previously seen pulmonary nodules appear to be stable.  Her COPD is quite advanced. She is chronically on 2 L of oxygen. She's was continued on bronchodilators. Air entry appears to be fair bilaterally. She does have mildly increased work of breathing which may be her baseline. She will be placed on a short course of prednisone which should help her breathing and may also improve her appetite.  Procedures: Echo: Overall limited images. There is mild LVH with LVEF approximately 05-39%, grade 1 diastolic dysfunction with increased filling pressures. Bioprosthetic aortic valve is poorly visualized, no obvious aortic regurgitation or perivalvular leak, no gradients reported. Trivial tricuspid regurgitation, unable to assess PASP   Consultations:  Neurology     01/16/2014 1st  Pulmonary office visit/ [name]  / "maint rx" with spiriva and neb ? Albuterol plus combivent but not able to activate  Chief Complaint  Patient presents with  . Pulmonary Consult    Referred by [name]. Pt has been dxed with COPD. She c/o having panic attacks in  the night due to SOB for the past month.  Her cough is non prod.   did better enough p 20 days of SNF for d/c home  and was sob  room - room intially for a week or so then bed to chair ever since and then started having noct spells of sob, better for the week  prior to ov/ pt says legs are what stops her from walking / fm not so sure. Cough is sporadic/ dry  Not sure she really benefits at all from meds listed above  >steroid taper   01/29/2014 Follow up and medication review Patient was seen last visit for a pulmonary consult for shortness of breath and COPD She was treated with a steroid taper. Her propanolol was discontinued Patient returns today feeling much improved with decreased cough, shortness of breath and wheezing Her daughter accompanies her today. She helps her with her medications We reviewed all her medications organize them into a medication calendar with patient education Appears that she is taking her medications correctly rec Follow med calendar closely and bring to each visit Continue on current regimen    04/01/2014 f/u ov/[name] re: GOLD III copd with reversibility on just spiriva each am  Chief Complaint  Patient presents with  . Follow-up    Review PFT. Pt reports that breathing has improved since last OV.    Room to room is ok   No longer using any 02 including at bedtime No cough or congestion  Not using saba   No obvious day to day or daytime variabilty or assoc chronic cough or cp or chest tightness, subjective wheeze overt sinus or hb symptoms. No unusual exp hx or h/o childhood pna/ asthma or knowledge of premature birth.  Sleeping ok without nocturnal  or early am exacerbation  of respiratory  c/o's or need for noct saba. Also denies any obvious fluctuation of symptoms with weather or environmental changes or other aggravating or alleviating factors except as outlined above   Current Medications, Allergies, Complete Past Medical History, Past Surgical History, Family History, and Social History were reviewed in [employer] record.  ROS  The following are not active complaints unless bolded sore throat, dysphagia, dental problems, itching, sneezing,  nasal congestion or excess/ purulent  secretions, ear ache,   fever, chills, sweats, unintended wt loss, pleuritic or exertional cp, hemoptysis,  orthopnea pnd or leg swelling, presyncope, palpitations, heartburn, abdominal pain, anorexia, nausea, vomiting, diarrhea  or change in bowel or urinary habits, change in stools or urine, dysuria,hematuria,  rash, arthralgias, visual complaints, headache, numbness weakness or ataxia or problems with walking or coordination,  change in mood/affect or memory.                         Objective:   Physical Exam  Chronically ill elderly wf w/c bound but  no longer on 02    Wt Readings from Last 3 Encounters:  04/01/14 99 lb (44.906 kg)  03/30/14 99 lb (44.906 kg)  01/29/14 91 lb 12.8 oz (41.64 kg)    Vital signs reviewed     HEENT mild turbinate edema.  Oropharynx no thrush or excess pnd or cobblestoning.  No JVD or cervical adenopathy. Mild accessory muscle hypertrophy. Trachea midline, nl thryroid. Chest was hyperinflated by percussion with diminished breath sounds and moderate increased exp time without wheeze. Hoover sign positive at mid inspiration. Regular rate and rhythm without murmur gallop or rub or increase P2  or edema.  Abd: no hsm, nl excursion. Ext warm without cyanosis or clubbing.    cxr 01/16/14  COPD. There is no evidence of pneumonia, CHF, nor other acute cardiopulmonary abnormality.      Assessment & Plan:   Outpatient Encounter Prescriptions as of 04/01/2014  Medication Sig  . ALPRAZolam (XANAX) 0.25 MG tablet Take 0.25 mg by mouth every 6 (six) hours as needed for anxiety.  Marland Kitchen aspirin 81 MG tablet Take 1 tablet (81 mg total) by mouth daily.  . Calcium Carbonate-Vit D-Min (QC CALCIUM/MINERALS/VITAMIN D) 600-400 MG-UNIT TABS Take 1 tablet by mouth every evening.   . Choline Fenofibrate (TRILIPIX) 135 MG capsule Take 135 mg by mouth at bedtime.  . famotidine (PEPCID) 20 MG tablet Take 20 mg by mouth at bedtime.  . Ferrous Fumarate (IRON) 18 MG TBCR Take 1  tablet by mouth every other day.  . folic acid (FOLVITE) 1 MG tablet Take 1 tablet (1 mg total) by mouth daily.  Marland Kitchen ibuprofen (ADVIL,MOTRIN) 200 MG tablet Per bottle as needed  . lansoprazole (PREVACID) 15 MG capsule Take 15 mg by mouth daily at 12 noon.  . loratadine (CLARITIN) 10 MG tablet Take 10 mg by mouth daily as needed (drippy nose).  Marland Kitchen losartan (COZAAR) 25 MG tablet TAKE 1/2 TABLET EVERY DAY AS NEEDED  . mirtazapine (REMERON) 7.5 MG tablet TAKE 1 TABLET BY MOUTH AT BEDTIME  . Multiple Vitamins-Minerals (MULTIVITAMINS THER. W/MINERALS) TABS tablet Take 1 tablet by mouth daily.  . Probiotic Product (PROBIOTIC DAILY PO) Take by mouth.  . sertraline (ZOLOFT) 50 MG tablet Take 1 tablet (50 mg total) by mouth daily.  Marland Kitchen tiotropium (SPIRIVA) 18 MCG inhalation capsule Place 18 mcg into inhaler and inhale daily.  . vitamin C (ASCORBIC ACID) 500 MG tablet Take 1,000 mg by mouth at bedtime.   . Vitamin D, Ergocalciferol, (DRISDOL) 50000 UNITS CAPS capsule Take 1 capsule (50,000 Units total) by mouth every 7 (seven) days.  . [DISCONTINUED] albuterol (PROVENTIL) (2.5 MG/3ML) 0.083% nebulizer solution Take 2.5 mg by nebulization every 4 (four) hours as needed for wheezing or shortness of breath.  . [DISCONTINUED] donepezil (ARICEPT) 5 MG tablet TAKE 1 TABLET AT BEDTIME  . [DISCONTINUED] ferrous sulfate 325 (65 FE) MG EC tablet Take 325 mg by mouth. 1 tab Every other day   d

## 2014-04-01 NOTE — Patient Instructions (Addendum)
Continue spiriva every day   Only use your albuterol neb  as a rescue medication to be used if you can't catch your breath by resting or doing a relaxed purse lip breathing pattern.  - The less you use it, the better it will work when you need it. - Ok to use up to   every 4 hours if you must but call for immediate appointment if use goes up over your usual need  If starts needing more albuterol my recommendation is to add BREO 100 one puff each am.  Pulmonary follow up is as needed

## 2014-04-03 ENCOUNTER — Other Ambulatory Visit: Payer: Self-pay | Admitting: Family Medicine

## 2014-04-05 ENCOUNTER — Encounter: Payer: Self-pay | Admitting: Internal Medicine

## 2014-04-05 NOTE — Assessment & Plan Note (Addendum)
-   trial off propranolol 01/16/14  - PFTs 04/01/14  fev 0.79 (40%)  Ratio 51 p 29% improvement from saba/ dlco 32% on spiriva one each am   I had an extended discussion with the patient reviewing all relevant studies completed to date and  lasting 15 to 20 minutes of a 25 minute visit on the following ongoing concerns:   So clearly has a reversible component with baseline severe copd so next step is add BREO and x sev weeks then d/c spiriva to see what difference if any combination therapy vs singe inhaler rx     Each maintenance medication was reviewed in detail including most importantly the difference between maintenance and as needed and under what circumstances the prns are to be used.  Please see instructions for details which were reviewed in writing and the patient given a copy.

## 2014-04-12 DIAGNOSIS — Z23 Encounter for immunization: Secondary | ICD-10-CM | POA: Insufficient documentation

## 2014-04-12 NOTE — Assessment & Plan Note (Signed)
Marked improveme nt on current regime has been evaluated by pulmonary

## 2014-04-12 NOTE — Assessment & Plan Note (Signed)
Titrate arcicept to std dose, call back if intolerant

## 2014-04-12 NOTE — Assessment & Plan Note (Signed)
Improved since last visit, pt encouraged to continue current eating habits

## 2014-04-12 NOTE — Assessment & Plan Note (Signed)
Improved greatly since now living with ehjr son, daughter in law cares for her

## 2014-04-12 NOTE — Assessment & Plan Note (Signed)
Deteriorated Hyperlipidemia:Low fat diet discussed and encouraged.  Updated lab needed at/ before next visit.  

## 2014-04-12 NOTE — Assessment & Plan Note (Signed)
Stable currently, needs to be re evaluated by endo who was treating her in the past

## 2014-04-12 NOTE — Assessment & Plan Note (Signed)
Vaccine administered at visit.  

## 2014-04-12 NOTE — Assessment & Plan Note (Signed)
Controlled, no change in medication  

## 2014-04-27 ENCOUNTER — Other Ambulatory Visit: Payer: Self-pay | Admitting: Family Medicine

## 2014-05-04 ENCOUNTER — Telehealth: Payer: Self-pay

## 2014-05-04 MED ORDER — PREDNISONE 5 MG PO TABS: 5.0000 mg | ORAL_TABLET | Freq: Two times a day (BID) | ORAL | Status: AC

## 2014-05-04 MED ORDER — BENZONATATE 100 MG PO CAPS: 100.0000 mg | ORAL_CAPSULE | Freq: Two times a day (BID) | ORAL | Status: DC | PRN

## 2014-05-04 NOTE — Telephone Encounter (Signed)
Daughter in law aware of meds and advice.  She states that after talking the first time that patient was able to cough up some sputum that was a yellow/green color.  No fever

## 2014-05-04 NOTE — Telephone Encounter (Signed)
Have her collect cup and order sputum c/s pls if she develops fever or chills

## 2014-05-04 NOTE — Telephone Encounter (Signed)
Prednisone for 5 days prescribed and tessalon perles, she has had albuterol inhaler in the past, if she still has this, since wheezing use once or twice daily till symptoms improve.If she has no inhaler pls erx albuterol MDI 2 puffs every 6 to 8 hours as needed for wheezimg  Pls verify no fever or chills, or colored sputum. She has  COPD and very poor lung function, if she has any of these will need antibioitc also, so I need to know, advise call back if not improving pls

## 2014-05-06 ENCOUNTER — Other Ambulatory Visit: Payer: Self-pay | Admitting: Family Medicine

## 2014-05-06 NOTE — Telephone Encounter (Signed)
Spoke with daughter in law and she is aware of the need for sputum collection.  She states that if patient does not respond from prednisone she will come by and collect specimen cup and supplies.

## 2014-06-04 ENCOUNTER — Telehealth: Payer: Self-pay | Admitting: Family Medicine

## 2014-06-04 ENCOUNTER — Other Ambulatory Visit: Payer: Self-pay | Admitting: Family Medicine

## 2014-06-04 IMAGING — CR DG CHEST 1V PORT
1 series · 1 of 1 positions shown · non-contrast
Comparison: 11/25/2037.

CLINICAL DATA: Shortness of breath.  Weakness.  COPD with chronic
bronchitis.

PORTABLE CHEST - 1 VIEW

[view not recorded]
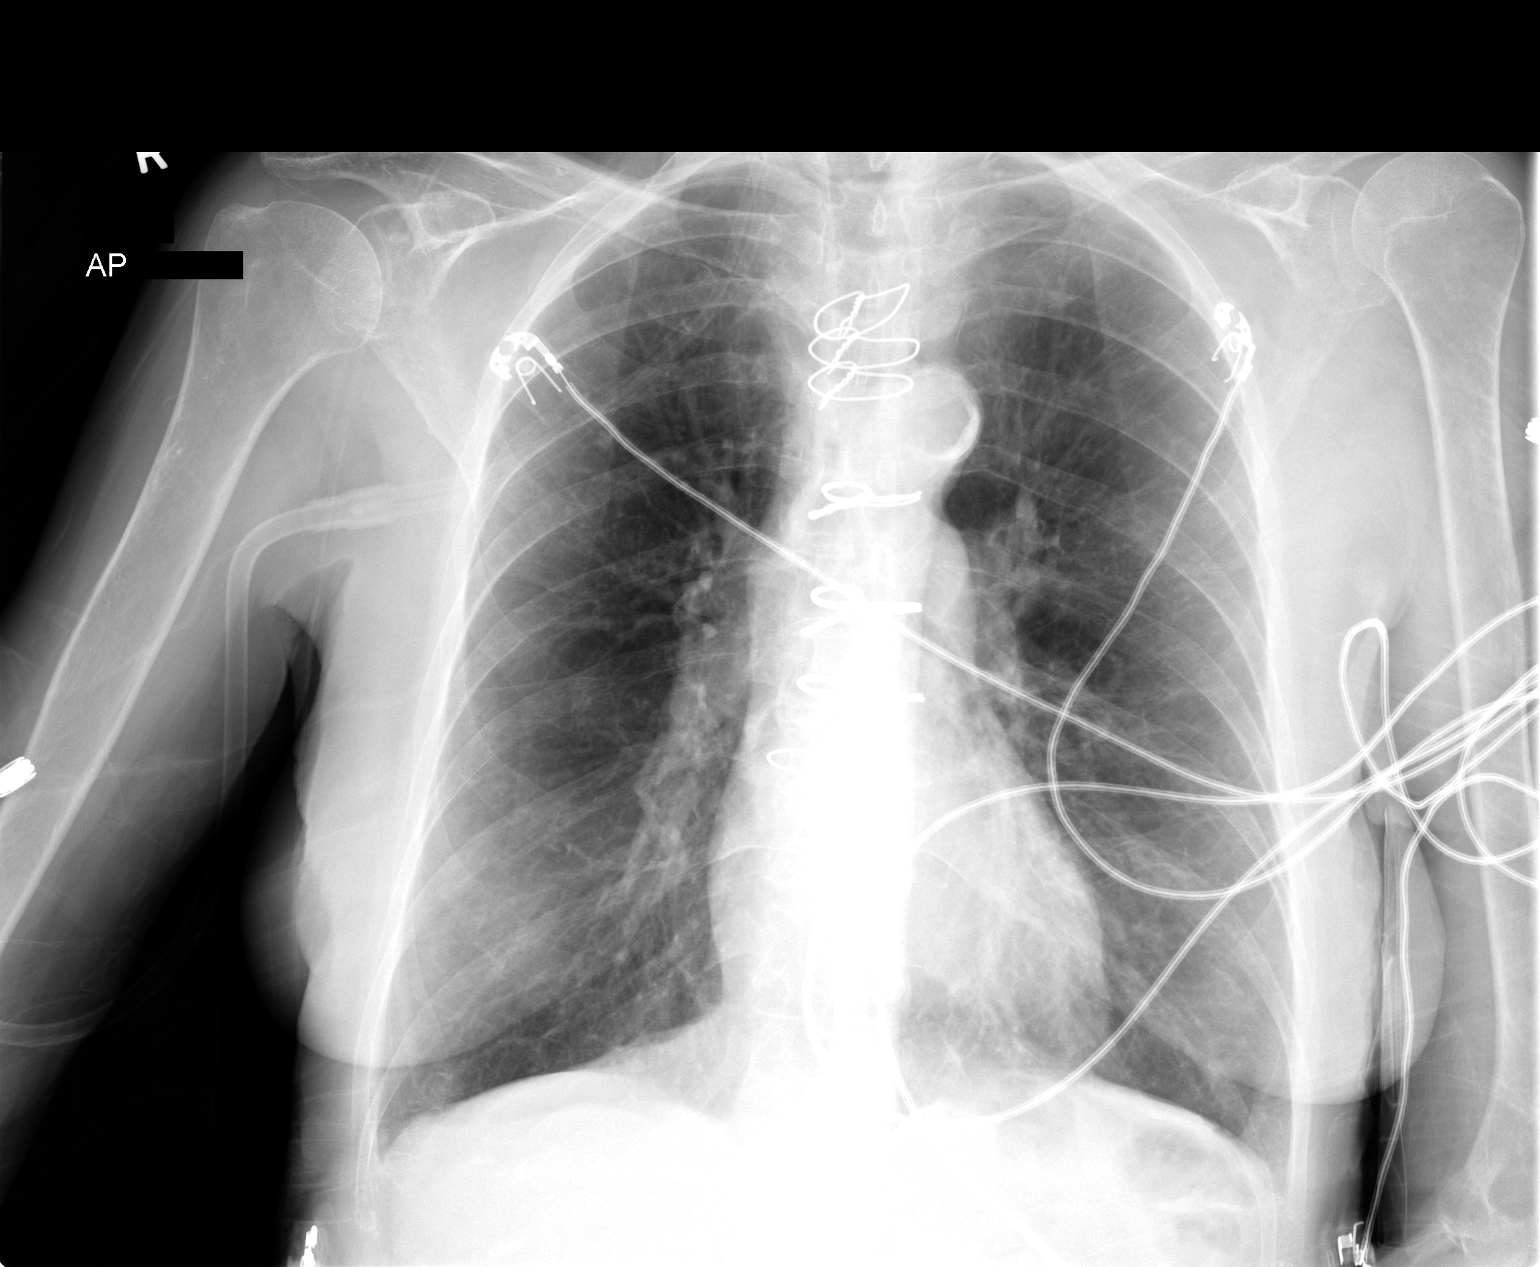

[1 of 1 positions shown; findings below may reference images not displayed]

FINDINGS: Cardiac silhouette is normal size and shape. Ectasia and
nonaneurysmal calcification of the thoracic aorta are seen.  Hilar
and mediastinal contours appear stable.  Previous median sternotomy
has been performed.

The diaphragm is low-lying with flattening consistent with
generalized hyperinflation and obstructive pulmonary disease.  No
pulmonary infiltrates or masses are seen.  There is some central
peribronchial thickening.  There is slightly osteopenic appearance
of bones.
IMPRESSION: Hyperinflation configuration consistent with COPD.  Central
peribronchial thickening.

## 2014-06-04 MED ORDER — DONEPEZIL HCL 10 MG PO TABS: ORAL_TABLET | ORAL | Status: DC

## 2014-06-04 NOTE — Telephone Encounter (Signed)
Med sent.

## 2014-06-11 ENCOUNTER — Telehealth: Payer: Self-pay

## 2014-06-11 DIAGNOSIS — R059 Cough, unspecified: Secondary | ICD-10-CM

## 2014-06-11 DIAGNOSIS — R05 Cough: Secondary | ICD-10-CM | POA: Diagnosis not present

## 2014-06-11 NOTE — Telephone Encounter (Signed)
With no fever, chills or systemic symptoms, will hold on antibiotic until she has sputum culture back , she has multiple known drug allergies and unless she needs an antibiotic she should not have one

## 2014-06-11 NOTE — Telephone Encounter (Signed)
States [name] has been coughing for a month producing green phlegm and also greenish yellow sinus drainage. Otherwise feels well, no fever, chills or bodyaches. I saw in the last message she needed a sputum culture that was never done so Crissy will come collect container and order. Wants to know if you can prescribe something now since weekend is coming up or if you have to get results back first? Please advise

## 2014-06-12 NOTE — Telephone Encounter (Signed)
Noted  

## 2014-06-15 LAB — RESPIRATORY CULTURE OR RESPIRATORY AND SPUTUM CULTURE
GRAM STAIN: NONE SEEN
GRAM STAIN: NONE SEEN
ORGANISM ID, BACTERIA: NORMAL

## 2014-06-21 ENCOUNTER — Other Ambulatory Visit: Payer: Self-pay | Admitting: Family Medicine

## 2014-07-14 ENCOUNTER — Other Ambulatory Visit: Payer: Self-pay | Admitting: Family Medicine

## 2014-08-07 ENCOUNTER — Other Ambulatory Visit: Payer: Self-pay | Admitting: Family Medicine

## 2014-08-11 ENCOUNTER — Other Ambulatory Visit: Payer: Self-pay | Admitting: Family Medicine

## 2014-08-11 DIAGNOSIS — E785 Hyperlipidemia, unspecified: Secondary | ICD-10-CM | POA: Diagnosis not present

## 2014-08-11 DIAGNOSIS — I10 Essential (primary) hypertension: Secondary | ICD-10-CM | POA: Diagnosis not present

## 2014-08-11 DIAGNOSIS — D539 Nutritional anemia, unspecified: Secondary | ICD-10-CM | POA: Diagnosis not present

## 2014-08-11 DIAGNOSIS — E559 Vitamin D deficiency, unspecified: Secondary | ICD-10-CM | POA: Diagnosis not present

## 2014-08-12 LAB — COMPLETE METABOLIC PANEL WITH GFR
ALBUMIN: 3.8 g/dL (ref 3.5–5.2)
ALT: 9 U/L (ref 0–35)
AST: 21 U/L (ref 0–37)
Alkaline Phosphatase: 66 U/L (ref 39–117)
BUN: 27 mg/dL — ABNORMAL HIGH (ref 6–23)
CALCIUM: 10 mg/dL (ref 8.4–10.5)
CHLORIDE: 103 meq/L (ref 96–112)
CO2: 29 meq/L (ref 19–32)
CREATININE: 1.14 mg/dL — AB (ref 0.50–1.10)
GFR, EST AFRICAN AMERICAN: 56 mL/min — AB
GFR, EST NON AFRICAN AMERICAN: 48 mL/min — AB
GLUCOSE: 88 mg/dL (ref 70–99)
Potassium: 4.4 mEq/L (ref 3.5–5.3)
Sodium: 142 mEq/L (ref 135–145)
TOTAL PROTEIN: 6.8 g/dL (ref 6.0–8.3)
Total Bilirubin: 0.4 mg/dL (ref 0.2–1.2)

## 2014-08-12 LAB — CBC WITH DIFFERENTIAL/PLATELET
BASOS ABS: 0 10*3/uL (ref 0.0–0.1)
Basophils Relative: 0 % (ref 0–1)
EOS ABS: 0.2 10*3/uL (ref 0.0–0.7)
Eosinophils Relative: 3 % (ref 0–5)
HCT: 36.3 % (ref 36.0–46.0)
Hemoglobin: 11.7 g/dL — ABNORMAL LOW (ref 12.0–15.0)
Lymphocytes Relative: 27 % (ref 12–46)
Lymphs Abs: 1.9 10*3/uL (ref 0.7–4.0)
MCH: 27.4 pg (ref 26.0–34.0)
MCHC: 32.2 g/dL (ref 30.0–36.0)
MCV: 85 fL (ref 78.0–100.0)
MPV: 10.3 fL (ref 8.6–12.4)
Monocytes Absolute: 0.4 10*3/uL (ref 0.1–1.0)
Monocytes Relative: 6 % (ref 3–12)
NEUTROS PCT: 64 % (ref 43–77)
Neutro Abs: 4.6 10*3/uL (ref 1.7–7.7)
PLATELETS: 393 10*3/uL (ref 150–400)
RBC: 4.27 MIL/uL (ref 3.87–5.11)
RDW: 15.5 % (ref 11.5–15.5)
WBC: 7.2 10*3/uL (ref 4.0–10.5)

## 2014-08-12 LAB — LIPID PANEL
CHOL/HDL RATIO: 2.6 ratio
Cholesterol: 186 mg/dL (ref 0–200)
HDL: 72 mg/dL (ref >=46)
LDL Cholesterol: 100 mg/dL — ABNORMAL HIGH (ref 0–99)
Triglycerides: 69 mg/dL (ref <150)
VLDL: 14 mg/dL (ref 0–40)

## 2014-08-12 LAB — VITAMIN D 25 HYDROXY (VIT D DEFICIENCY, FRACTURES): Vit D, 25-Hydroxy: 61 ng/mL (ref 30–100)

## 2014-08-12 LAB — FERRITIN: Ferritin: 24 ng/mL (ref 10–291)

## 2014-08-12 LAB — IRON: Iron: 65 ug/dL (ref 42–145)

## 2014-08-12 LAB — TSH: TSH: 0.725 u[IU]/mL (ref 0.350–4.500)

## 2014-08-20 ENCOUNTER — Other Ambulatory Visit: Payer: Self-pay | Admitting: Family Medicine

## 2014-08-21 ENCOUNTER — Other Ambulatory Visit: Payer: Self-pay | Admitting: Family Medicine

## 2014-08-27 ENCOUNTER — Encounter: Payer: Self-pay | Admitting: Family Medicine

## 2014-08-27 ENCOUNTER — Ambulatory Visit (INDEPENDENT_AMBULATORY_CARE_PROVIDER_SITE_OTHER): Payer: Medicare Other | Admitting: Family Medicine

## 2014-08-27 ENCOUNTER — Other Ambulatory Visit: Payer: Self-pay | Admitting: Family Medicine

## 2014-08-27 VITALS — BP 116/80 | HR 88 | Resp 18 | Ht 61.0 in | Wt 103.0 lb

## 2014-08-27 DIAGNOSIS — M2740 Unspecified cyst of jaw: Secondary | ICD-10-CM | POA: Diagnosis not present

## 2014-08-27 DIAGNOSIS — Z1231 Encounter for screening mammogram for malignant neoplasm of breast: Secondary | ICD-10-CM

## 2014-08-27 DIAGNOSIS — I5032 Chronic diastolic (congestive) heart failure: Secondary | ICD-10-CM

## 2014-08-27 DIAGNOSIS — J449 Chronic obstructive pulmonary disease, unspecified: Secondary | ICD-10-CM

## 2014-08-27 DIAGNOSIS — F341 Dysthymic disorder: Secondary | ICD-10-CM

## 2014-08-27 DIAGNOSIS — I10 Essential (primary) hypertension: Secondary | ICD-10-CM | POA: Diagnosis not present

## 2014-08-27 DIAGNOSIS — R911 Solitary pulmonary nodule: Secondary | ICD-10-CM

## 2014-08-27 DIAGNOSIS — R531 Weakness: Secondary | ICD-10-CM

## 2014-08-27 DIAGNOSIS — R636 Underweight: Secondary | ICD-10-CM

## 2014-08-27 DIAGNOSIS — F5105 Insomnia due to other mental disorder: Secondary | ICD-10-CM

## 2014-08-27 DIAGNOSIS — F418 Other specified anxiety disorders: Secondary | ICD-10-CM

## 2014-08-27 DIAGNOSIS — K219 Gastro-esophageal reflux disease without esophagitis: Secondary | ICD-10-CM

## 2014-08-27 DIAGNOSIS — F03918 Unspecified dementia, unspecified severity, with other behavioral disturbance: Secondary | ICD-10-CM

## 2014-08-27 DIAGNOSIS — F0391 Unspecified dementia with behavioral disturbance: Secondary | ICD-10-CM

## 2014-08-27 NOTE — Progress Notes (Signed)
Subjective:    Patient ID: [name], female    DOB: 1940/05/04, 74 y.o.   MRN: [id number]  HPI The PT is here for follow up and re-evaluation of chronic medical conditions, medication management and review of any available recent lab and radiology data.  Preventive health is updated, specifically  Cancer screening and Immunization.   Questions or concerns regarding consultations or procedures which the PT has had in the interim are  addressed. The PT denies any adverse reactions to current medications since the last visit.  Both of her caregivers are present at the visit, and there is obvious severe  Breakdown in positive family communication and interaction. [name] is openly hostile towards her daughter in law, who she states is controlling and bossy and [name] displays obvious dislike for her. Her son i as well is clearly distressed about this situation, which , I believe to be indefinitely untenable. Family requests "any help" or new options to improve the situation. Knowing [name], I absolutely believe that some of her behavior is a result of dementia, and I highly recommend both day care placement when it becomes available as well as psych h evaluataion and help with management. [name] feels as though she is a "prisoner' , unable to express herself freely even within the confines of her bedroom, states daughter in law throws away things which she keeps ,a nd re[peats a desire to return to living with her sister. She is incapable however of doing this and remaining in as good physical health as she has now become , so this is not an option worth pursuing        Review of Systems See HPI Denies recent fever or chills. Denies sinus pressure, nasal congestion, ear pain or sore throat. C/o chronic chest congestion, and  cough denies  Wheezing and has improved exercise tolerance Denies chest pains, palpitations and leg swelling Denies abdominal pain, nausea, vomiting,diarrhea or  constipation.  Improved appetite with weight gain Denies dysuria, frequency, hesitancy or incontinence. Denies joint pain, swelling, she does  limitation in mobility due to chronic respiratory illness Denies headaches, seizures, numbness, or tingling. C/o  depression, anxiety and mild  Insomnia.Not suicidal or homicidal but very angry Denies skin break down or rash.c/o left jaw lesion wants this removed        Objective:   Physical Exam  BP 116/80 mmHg  Pulse 88  Resp 18  Ht 5\' 1"  (1.549 m)  Wt 103 lb 0.6 oz (46.739 kg)  BMI 19.48 kg/m2  SpO2 98% Patient alert and oriented and in no cardiopulmonary distress at rest   HEENT: No facial asymmetry, EOMI,   oropharynx pink and moist.  Neck supple no JVD, no mass.  Chest: Adequate though reduced air entry bilaterally, no wheezes or crackles heard CVS: S1, S2 systolic murmur, no S3.Regular rate.  ABD: Soft non tender.   Ext: No edema  VF:[id number] though adequate ROM spine, shoulders, hips and knees.  Skin: Intact, no ulcerations or rash noted.Cyst on jaw, non tender   Psych: Good eye contact, agitated when discussing her daughter in law who is present. Memory impaired,   Anxious and mildly  depressed appearing.  CNS: CN 2-12 intact, power,  normal throughout.no focal deficits noted.       Assessment & Plan:  Essential hypertension, benign Controlled, no change in medication   Diastolic CHF, chronic Ccompensated and stable currently, asymptomatic from a cardiac standpoint  COPD GOLD III with reversibility  Improved, has ahd full pulmonary evaluation and is to be maintained on current medication management. She remains nicotine free which is excellent  Dementia with behavioral disturbance Worsening. Pt openly hostile to her primary caregiver, her daughter in law. Recommend strongly that she attend adult daycare , and am requesting [facility] involvement  Insomnia secondary to depression with anxiety Sleep hygiene  reviewed and written information offered also. Prescription sent for  medication needed.   Depression with anxiety Not suicidal or homicidal, but currently unhappy in current living situation, a lot of this I believe is attributable to her dementia. Will consider referral to psychiatry if not much improvement for med assistrance  Underweight Improved, with 13 pound weight gin since last visit, continue remron  Weakness generalized Improving with weight gain and  improved respiratoty function  Jaw cyst Left jaw cyst , enlarging per pt histroy ,  Refer to dermatology for eval and management  Lung nodule Long h/o nicotine use, has new nodule noted in 10/2013 , re imaging recommended due Sept 2016, has multiple lung nodules otherwise  GERD (gastroesophageal reflux disease) Controlled, no change in medication

## 2014-08-27 NOTE — Patient Outreach (Signed)
[facility] [facility]) Care Management  08/27/2014  [name] [birth year] [id number]   Referral from MD office, assigned [name], RN to outreach patient for [place] Management services.  [name]. [place], [place] Management [place] Assistant Phone: [phone] Fax: [phone]

## 2014-08-27 NOTE — Patient Instructions (Signed)
F/u in 4 month  Medication is as listed and is yours   You are referred for a mammogram  You have gained 13 pounds and you are much stronger and healthier  Case worker will come  For home visit   You are referred to dermatology about the lesion on left jaw  Thanks for choosing [facility], we consider it a privelige to serve you.

## 2014-09-01 ENCOUNTER — Other Ambulatory Visit: Payer: Self-pay | Admitting: Family Medicine

## 2014-09-02 ENCOUNTER — Ambulatory Visit (HOSPITAL_COMMUNITY)
Admission: RE | Admit: 2014-09-02 | Discharge: 2014-09-02 | Disposition: A | Discharge status: Home / Self Care | Payer: Medicare Other | Source: Ambulatory Visit | Attending: Family Medicine | Admitting: Family Medicine

## 2014-09-02 ENCOUNTER — Other Ambulatory Visit: Payer: Self-pay | Admitting: Family Medicine

## 2014-09-02 ENCOUNTER — Other Ambulatory Visit: Payer: Self-pay | Admitting: *Deleted

## 2014-09-02 DIAGNOSIS — Z1231 Encounter for screening mammogram for malignant neoplasm of breast: Secondary | ICD-10-CM | POA: Insufficient documentation

## 2014-09-02 NOTE — Patient Outreach (Signed)
[place] [facility]) Care Management  09/02/2014  [name] 05-02-1940 [id number]  MD referral: Telephone call to patient; left message on voice mail requesting return call.  [name], RN BSN [place] Management Coordinator [facility] Care Management  [phone]

## 2014-09-03 ENCOUNTER — Other Ambulatory Visit: Payer: Self-pay | Admitting: *Deleted

## 2014-09-03 DIAGNOSIS — J449 Chronic obstructive pulmonary disease, unspecified: Secondary | ICD-10-CM

## 2014-09-03 DIAGNOSIS — G3183 Dementia with Lewy bodies: Secondary | ICD-10-CM

## 2014-09-03 DIAGNOSIS — F0280 Dementia in other diseases classified elsewhere without behavioral disturbance: Secondary | ICD-10-CM

## 2014-09-03 NOTE — Patient Outreach (Signed)
[place] [facility]) Care Management  09/03/2014  [name] Apr 15, 1940 [id number]   MD referral COPD (social problems):  Received incoming call from caregiver/daughter-in-law-[name]; who voices that she answers all calls for patient. States patient has diagnosis of dementia and early Alzheimer's and COPD.     Caregiver states patient has lived with her son's family x 2 years because she was not able to care for herself.  Currently daughter-in-law and family prepares patient meals, helps her with personal care, manages patient's medications & takes her to MD appointments.   States patient is able to feed herself and can walk without an assistive device. 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 States she does have memory loss.  States she has diagnosis of COPD but currently is under control because she makes sure patient is getting medications as prescribed. States she has pulmonologist and  takes her to appointments as scheduled.    Caregiver states there have been no problems getting patient medications and setting up appointments with doctors. States currently no financial issues.   Major concerns about patient are her moods about certain situations and strategies to try to help her.  States she has a tendency to pack & save things in her room and gets upset when caregiver tries to tidy her surroundings.  States she is not sure about patient feelings adult daycare.   Caregiver states she would like to learn more about COPD action plan if patient runs into problems.   Recommendations for referral to [facility] care coordinator for patient with dementia and early Alzheimer's, COPD; falls risk;  Recommendation for referral to health coach so caregiver will be more comfortable with COPD action plan.   Caregiver-agrees to [facility] referral to [place].    Plan: referral to [facility] care coordinator and Health Coach; telephonic care coordiantor signing  off.   Sabrina Daisy, RN BSN [place] Management Coordinator [facility] Care Management  [phone]

## 2014-09-04 ENCOUNTER — Other Ambulatory Visit: Payer: Self-pay | Admitting: Family Medicine

## 2014-09-04 DIAGNOSIS — R928 Other abnormal and inconclusive findings on diagnostic imaging of breast: Secondary | ICD-10-CM

## 2014-09-04 NOTE — Patient Outreach (Signed)
[place] [facility]) Care Management  09/04/2014  [name] April 02, 1940 [id number]   Request from [name], RN to assign RN Health Coach, Jon Billings, RN assigned.  [name]. [place], [place] Management [place] Assistant Phone: [phone] Fax: [phone]

## 2014-09-07 ENCOUNTER — Other Ambulatory Visit: Payer: Self-pay | Admitting: *Deleted

## 2014-09-07 ENCOUNTER — Other Ambulatory Visit: Payer: Self-pay

## 2014-09-07 NOTE — Patient Outreach (Signed)
[place] [facility]) Care Management  09/07/2014  [name] 1940/07/01 [id number]   Telephone call to daughter-in-law Chrissy. She states that patient is having no problems with her COPD but she does not know how to handle patient when she does have a crisis situation with her COPD. She reports that patient has dementia and is forgetful but has done well since coming from the nursing home.  Daughter-in-law reports that patient does not wander, takes her medications, and has no problems with incontinence.  However, will do referral for case to be sent to [facility] Nurse for evaluation.    Plan: RN Health Coach will send for referral to community nurse.    Jone Baseman, RN, MSN [place] [phone]

## 2014-09-07 NOTE — Patient Outreach (Signed)
[place] [facility]) Care Management  09/07/2014  [name] [birth year] [id number]  I spoke with Dr. Moshe Cipro in person this morning and by phone with [place], [facility]. Dr. Moshe Cipro initially referred [name] for evaluation for community resources and discussion with patient and family (son Timmothy Sours with whom patient lives) about deteriorating cognition and increasing care needs.   Ms. Dia Sitter spoke with Mrs. Boyson and determined from her conversation that a face to face visit with the patient/family is warranted. Dr. Moshe Cipro agrees that face to face evaluation is needed in this case also.   I attempted to contact Mrs. Cerny and/or her son but was unable to reach either of them today. I was able to speak with the grandson with whom I left a HIPPA compliant message stating I would return a call to Mr. Rosello tomorrow.    [place] Management  [phone]

## 2014-09-08 ENCOUNTER — Other Ambulatory Visit: Payer: Self-pay | Admitting: Family Medicine

## 2014-09-09 DIAGNOSIS — D225 Melanocytic nevi of trunk: Secondary | ICD-10-CM | POA: Diagnosis not present

## 2014-09-09 DIAGNOSIS — L821 Other seborrheic keratosis: Secondary | ICD-10-CM | POA: Diagnosis not present

## 2014-09-10 ENCOUNTER — Other Ambulatory Visit: Payer: Self-pay | Admitting: *Deleted

## 2014-09-10 NOTE — Patient Outreach (Signed)
[place] [facility]) Care Management  09/10/2014  [name] 09-16-1940 [id number]  I spoke with SabrinaSabrina [name] who is the son and durable POA for Mrs. [name] at the request of [place]. I tried contacting [name] by phone earlier this week but couldn't speak with her on the phone ( her son [name] says she typically will not speak on the phone ). I explained [place] Management services to [name] and offered to send information to him by mail. He was appreciative of our outreach and glad to know about our services and availability.   [name] told me that [name] lives with his brother [name] and his wife [name] who is SabrinaGreene's primary caregiver. Mr. [name] states he is very appreciative of the exceptional care provided by [name] and feels that [name] is doing better in this situation than she has been in some years. Prior to moving in with her son, [name] sister was living with her but became unable to provide care. Mr. Traxler says he doesn't know of any particular needs for [name] at this time but would like for me to try to reach [name] in the morning.   I will reach out to [name]/[name] tomorrow morning.    [employer] Management  [phone]

## 2014-09-15 ENCOUNTER — Ambulatory Visit
Admission: RE | Admit: 2014-09-15 | Discharge: 2014-09-15 | Disposition: A | Discharge status: Home / Self Care | Payer: Medicare Other | Source: Ambulatory Visit | Attending: Family Medicine | Admitting: Family Medicine

## 2014-09-15 DIAGNOSIS — R928 Other abnormal and inconclusive findings on diagnostic imaging of breast: Secondary | ICD-10-CM

## 2014-10-17 NOTE — Assessment & Plan Note (Signed)
Ccompensated and stable currently, asymptomatic from a cardiac standpoint

## 2014-10-17 NOTE — Assessment & Plan Note (Signed)
Worsening. Pt openly hostile to her primary caregiver, her daughter in law. Recommend strongly that she attend adult daycare , and am requesting [facility] involvement

## 2014-10-17 NOTE — Assessment & Plan Note (Signed)
Not suicidal or homicidal, but currently unhappy in current living situation, a lot of this I believe is attributable to her dementia. Will consider referral to psychiatry if not much improvement for med assistrance

## 2014-10-17 NOTE — Assessment & Plan Note (Signed)
Improving with weight gain and  improved respiratoty function

## 2014-10-17 NOTE — Assessment & Plan Note (Signed)
Improved, with 13 pound weight gin since last visit, continue remron

## 2014-10-17 NOTE — Assessment & Plan Note (Signed)
Improved, has ahd full pulmonary evaluation and is to be maintained on current medication management. She remains nicotine free which is excellent

## 2014-10-17 NOTE — Assessment & Plan Note (Signed)
Controlled, no change in medication  

## 2014-10-17 NOTE — Assessment & Plan Note (Signed)
Sleep hygiene reviewed and written information offered also. Prescription sent for  medication needed.  

## 2014-10-18 ENCOUNTER — Telehealth: Payer: Self-pay | Admitting: Family Medicine

## 2014-10-18 DIAGNOSIS — R911 Solitary pulmonary nodule: Secondary | ICD-10-CM

## 2014-10-18 DIAGNOSIS — K219 Gastro-esophageal reflux disease without esophagitis: Secondary | ICD-10-CM | POA: Insufficient documentation

## 2014-10-18 NOTE — Assessment & Plan Note (Signed)
Left jaw cyst , enlarging per pt histroy ,  Refer to dermatology for eval and management

## 2014-10-18 NOTE — Assessment & Plan Note (Signed)
Long h/o nicotine use, has new nodule noted in 10/2013 , re imaging recommended due Sept 2016, has multiple lung nodules otherwise

## 2014-10-18 NOTE — Assessment & Plan Note (Signed)
Controlled, no change in medication  

## 2014-10-18 NOTE — Telephone Encounter (Signed)
Pls contact pt /  And her caregivers, I see that she needs a rept chest scan with contrast as a "new " lung nodule was noted last September. I recommend she have this and ask that you order once family and pt are aware and agree , I will sign.  Also, ask if they DO want [facility] services, i see where attempts were made to spk directly with caregivers with no success, just  Following up out of concern. Let me kno w if they still want help and they need po provide contact info and expect the call so that they answer the telephone

## 2014-10-20 ENCOUNTER — Other Ambulatory Visit: Payer: Self-pay

## 2014-10-20 DIAGNOSIS — I10 Essential (primary) hypertension: Secondary | ICD-10-CM

## 2014-10-20 NOTE — Addendum Note (Signed)
Addended by: Eual Fines on: 10/20/2014 02:28 PM   Modules accepted: Orders

## 2014-10-20 NOTE — Telephone Encounter (Signed)
[facility] AWARE AND [facility] CALLED THEM BUT WASN'T REALLY ANY SERVICES THEY WERE ELIGIBLE FOR

## 2014-10-23 ENCOUNTER — Ambulatory Visit (HOSPITAL_COMMUNITY)
Admission: RE | Admit: 2014-10-23 | Discharge: 2014-10-23 | Disposition: A | Discharge status: Home / Self Care | Payer: Medicare Other | Source: Ambulatory Visit | Attending: Family Medicine | Admitting: Family Medicine

## 2014-10-23 DIAGNOSIS — R918 Other nonspecific abnormal finding of lung field: Secondary | ICD-10-CM | POA: Insufficient documentation

## 2014-10-23 DIAGNOSIS — Z87891 Personal history of nicotine dependence: Secondary | ICD-10-CM | POA: Diagnosis not present

## 2014-10-23 DIAGNOSIS — J439 Emphysema, unspecified: Secondary | ICD-10-CM | POA: Insufficient documentation

## 2014-10-23 DIAGNOSIS — R911 Solitary pulmonary nodule: Secondary | ICD-10-CM

## 2014-10-23 LAB — POCT I-STAT CREATININE: Creatinine, Ser: 1.2 mg/dL — ABNORMAL HIGH (ref 0.44–1.00)

## 2014-10-23 MED ORDER — IOHEXOL 300 MG/ML  SOLN
100.0000 mL | Freq: Once | INTRAMUSCULAR | Status: AC | PRN
  Administered 2014-10-23: 60 mL via INTRAVENOUS

## 2014-11-23 ENCOUNTER — Other Ambulatory Visit: Payer: Self-pay | Admitting: Family Medicine

## 2014-12-13 ENCOUNTER — Other Ambulatory Visit: Payer: Self-pay | Admitting: Family Medicine

## 2014-12-14 ENCOUNTER — Telehealth: Payer: Self-pay

## 2014-12-14 ENCOUNTER — Other Ambulatory Visit: Payer: Self-pay | Admitting: Family Medicine

## 2014-12-14 NOTE — Telephone Encounter (Signed)
[place] entered pls send and let her know

## 2014-12-15 ENCOUNTER — Other Ambulatory Visit: Payer: Self-pay

## 2014-12-15 MED ORDER — CLINDAMYCIN HCL 300 MG PO CAPS: ORAL_CAPSULE | ORAL | Status: DC

## 2014-12-15 NOTE — Telephone Encounter (Signed)
Daughter in law aware. Medication sent to pharmacy

## 2014-12-20 ENCOUNTER — Other Ambulatory Visit: Payer: Self-pay | Admitting: Family Medicine

## 2014-12-26 ENCOUNTER — Other Ambulatory Visit: Payer: Self-pay | Admitting: Family Medicine

## 2014-12-29 ENCOUNTER — Ambulatory Visit (INDEPENDENT_AMBULATORY_CARE_PROVIDER_SITE_OTHER): Payer: Medicare Other | Admitting: Family Medicine

## 2014-12-29 ENCOUNTER — Encounter: Payer: Self-pay | Admitting: Family Medicine

## 2014-12-29 VITALS — BP 118/70 | HR 98 | Resp 16 | Ht 61.0 in | Wt 104.1 lb

## 2014-12-29 DIAGNOSIS — F03918 Unspecified dementia, unspecified severity, with other behavioral disturbance: Secondary | ICD-10-CM

## 2014-12-29 DIAGNOSIS — D649 Anemia, unspecified: Secondary | ICD-10-CM | POA: Diagnosis not present

## 2014-12-29 DIAGNOSIS — E0590 Thyrotoxicosis, unspecified without thyrotoxic crisis or storm: Secondary | ICD-10-CM

## 2014-12-29 DIAGNOSIS — J3089 Other allergic rhinitis: Secondary | ICD-10-CM

## 2014-12-29 DIAGNOSIS — F0391 Unspecified dementia with behavioral disturbance: Secondary | ICD-10-CM

## 2014-12-29 DIAGNOSIS — J449 Chronic obstructive pulmonary disease, unspecified: Secondary | ICD-10-CM

## 2014-12-29 DIAGNOSIS — Z1211 Encounter for screening for malignant neoplasm of colon: Secondary | ICD-10-CM | POA: Diagnosis not present

## 2014-12-29 DIAGNOSIS — R531 Weakness: Secondary | ICD-10-CM

## 2014-12-29 DIAGNOSIS — I10 Essential (primary) hypertension: Secondary | ICD-10-CM

## 2014-12-29 DIAGNOSIS — K219 Gastro-esophageal reflux disease without esophagitis: Secondary | ICD-10-CM

## 2014-12-29 DIAGNOSIS — J309 Allergic rhinitis, unspecified: Secondary | ICD-10-CM | POA: Insufficient documentation

## 2014-12-29 DIAGNOSIS — E559 Vitamin D deficiency, unspecified: Secondary | ICD-10-CM

## 2014-12-29 LAB — HEMOCCULT GUIAC POC 1CARD (OFFICE): Fecal Occult Blood, POC: NEGATIVE

## 2014-12-29 MED ORDER — AZELASTINE HCL 0.1 % NA SOLN: 2.0000 | Freq: Two times a day (BID) | NASAL | Status: DC

## 2014-12-29 NOTE — Patient Instructions (Addendum)
Annual wellness in 4 month, call if you need me sooner  Rectal exam today shows no blood in stool  Thankful that you are much improved.  New additional med, astelin nasal spray to help with uncontrolled allergies  Fasting lipid, cmp , Vit D, cBC , iron and ferritin first week in December or shortly after

## 2014-12-29 NOTE — Progress Notes (Signed)
   Subjective:    Patient ID: [name], female    DOB: 1940-08-27, 74 y.o.   MRN: [id number]  HPI   [name]     MRN: [id number]      DOB: 06/20/1940   HPI [name] is here for follow up and re-evaluation of chronic medical conditions, medication management and review of any available recent lab and radiology data.  Preventive health is updated, specifically  Cancer screening and Immunization.   Questions or concerns regarding consultations or procedures which the PT has had in the interim are  addressed. The PT denies any adverse reactions to current medications since the last visit.  There are no new concerns.  C/o increased nasal driangae , clear and constant, dnies sinus pressure ROS Denies recent fever or chills. Denies , ear pain or sore throat. Denies chest congestion, productive cough or wheezing. Denies chest pains, palpitations and leg swelling Denies abdominal pain, nausea, vomiting,diarrhea or constipation.   Denies dysuria, frequency, hesitancy or incontinence. Denies joint pain, swelling and limitation in mobility. Denies headaches, seizures, numbness, or tingling. Denies depression, anxiety or insomnia. Denies skin break down or rash.   PE  BP 118/70 mmHg  Pulse 98  Resp 16  Ht 5\' 1"  (1.549 m)  Wt 104 lb 1.9 oz (47.229 kg)  BMI 19.68 kg/m2  SpO2 93%  Patient alert and oriented and in no cardiopulmonary distress.  HEENT: No facial asymmetry, EOMI,   oropharynx pink and moist.  Neck adequate ROM though reduced  no JVD, no mass.NO sinus tenderness, TM clear bilaterally, watery nasal drainage at visit  Chest: Clear to auscultation bilaterally.Decreased air entry though adequate  CVS: S1, S2 no murmurs, no S3.Regular rate.  ABD: Soft non tender.  Rectal: no mass, heme negative stool  Ext: No edema  MS: Adequate though reduced  ROM spine, shoulders, hips and knees.  Skin: Intact, no ulcerations or rash noted.  Psych: Good eye contact, normal  affect. Memory impaired not anxious or depressed appearing.  CNS: CN 2-12 intact, power,  normal throughout.no focal deficits noted.   Assessment & Plan   Allergic rhinitis Uncontrolled symptoms , add astellin  Essential hypertension, benign Controlled, no change in medication DASH diet and commitment to daily physical activity for a minimum of 30 minutes discussed and encouraged, as a part of hypertension management. The importance of attaining a healthy weight is also discussed.  BP/Weight 12/29/2014 08/27/2014 04/01/2014 03/30/2014 01/29/2014 01/16/2014 45/07/2561  Systolic BP 893 734 287 681 157 92 262  Diastolic BP 70 80 68 60 68 60 80  Wt. (Lbs) 104.12 103.04 99 99 91.8 90 90.08  BMI 19.68 19.48 18.72 18.72 17.35 17.01 17.03        COPD GOLD III with reversibility  Stable and less symptomatic with smoking cessation  GERD (gastroesophageal reflux disease) Controlled, no change in medication   Dementia with behavioral disturbance Stable and controlled on medication. Much more positive and better interaction with her primary caregiver/ daughter in law at this visit  Special screening for malignant neoplasms, colon Heme negative stool , no mass       Review of Systems     Objective:   Physical Exam        Assessment & Plan:

## 2014-12-31 ENCOUNTER — Other Ambulatory Visit: Payer: Self-pay | Admitting: Family Medicine

## 2015-01-09 DIAGNOSIS — Z1211 Encounter for screening for malignant neoplasm of colon: Secondary | ICD-10-CM | POA: Insufficient documentation

## 2015-01-09 NOTE — Assessment & Plan Note (Signed)
Stable and controlled on medication. Much more positive and better interaction with her primary caregiver/ daughter in law at this visit

## 2015-01-09 NOTE — Assessment & Plan Note (Signed)
Stable and less symptomatic with smoking cessation

## 2015-01-09 NOTE — Assessment & Plan Note (Signed)
Heme negative stool, no mass 

## 2015-01-09 NOTE — Assessment & Plan Note (Signed)
Controlled, no change in medication  

## 2015-01-09 NOTE — Assessment & Plan Note (Signed)
Uncontrolled symptoms , add astellin

## 2015-01-09 NOTE — Assessment & Plan Note (Signed)
Controlled, no change in medication DASH diet and commitment to daily physical activity for a minimum of 30 minutes discussed and encouraged, as a part of hypertension management. The importance of attaining a healthy weight is also discussed.  BP/Weight 12/29/2014 08/27/2014 04/01/2014 03/30/2014 01/29/2014 01/16/2014 123456  Systolic BP 123456 [id number]  Diastolic BP 70 80 68 60 68 60 80  Wt. (Lbs) 104.12 103.04 99 99 91.8 90 90.08  BMI 19.68 19.48 18.72 18.72 17.35 17.01 17.03

## 2015-01-09 NOTE — Assessment & Plan Note (Signed)
Marked improvement. 

## 2015-01-18 ENCOUNTER — Encounter: Payer: Self-pay | Admitting: *Deleted

## 2015-03-15 ENCOUNTER — Other Ambulatory Visit: Payer: Self-pay | Admitting: Family Medicine

## 2015-04-19 ENCOUNTER — Other Ambulatory Visit: Payer: Self-pay | Admitting: Family Medicine

## 2015-04-28 ENCOUNTER — Ambulatory Visit (INDEPENDENT_AMBULATORY_CARE_PROVIDER_SITE_OTHER): Payer: Medicare Other | Admitting: Family Medicine

## 2015-04-28 ENCOUNTER — Encounter: Payer: Self-pay | Admitting: Family Medicine

## 2015-04-28 VITALS — BP 154/74 | HR 95 | Resp 16 | Ht 61.0 in | Wt 106.0 lb

## 2015-04-28 DIAGNOSIS — Z0000 Encounter for general adult medical examination without abnormal findings: Secondary | ICD-10-CM | POA: Insufficient documentation

## 2015-04-28 DIAGNOSIS — I10 Essential (primary) hypertension: Secondary | ICD-10-CM

## 2015-04-28 DIAGNOSIS — E559 Vitamin D deficiency, unspecified: Secondary | ICD-10-CM | POA: Diagnosis not present

## 2015-04-28 DIAGNOSIS — R911 Solitary pulmonary nodule: Secondary | ICD-10-CM

## 2015-04-28 DIAGNOSIS — D649 Anemia, unspecified: Secondary | ICD-10-CM | POA: Diagnosis not present

## 2015-04-28 DIAGNOSIS — H547 Unspecified visual loss: Secondary | ICD-10-CM

## 2015-04-28 MED ORDER — LOSARTAN POTASSIUM 25 MG PO TABS: ORAL_TABLET | ORAL | Status: DC

## 2015-04-28 NOTE — Progress Notes (Signed)
Subjective:    Patient ID: [name], female    DOB: 13-Sep-1940, 75 y.o.   MRN: [id number]  HPI  Preventive Screening-Counseling & Management   Patient present here today for a Medicare annual wellness visit.   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 Former smoker    Risk Factors  Current exercise habits:  No exercise currently   Dietary issues discussed: heart healthy diet encouraged, limiting fried fatty foods and carbs    Cardiac risk factors: Aortic valve disease, CHF   Depression Screen  (Note: if answer to either of the following is "Yes", a more complete depression screening is indicated)   Over the past two weeks, have you felt down, depressed or hopeless? No  Over the past two weeks, have you felt little interest or pleasure in doing things? No  Have you lost interest or pleasure in daily life? No  Do you often feel hopeless? No  Do you cry easily over simple problems? No   Activities of Daily Living  In your present state of health, do you have any difficulty performing the following activities?  Driving?: doesn't drive  Managing money?: daughter in law/caregiver  Feeding yourself?:No Getting from bed to chair?:No Climbing a flight of stairs?:unable to do  Preparing food and eating?: caregiver does  Bathing or showering?: no  Getting dressed?:No Getting to the toilet?:No Using the toilet?:No Moving around from place to place?: uses a cane   Fall Risk Assessment In the past year have you fallen or had a near fall?:No Are you currently taking any medications that make you dizzy?:No   Hearing Difficulties: No Do you often ask people to speak up or repeat themselves?:no  Do you experience ringing or noises in your ears?:No Do you have difficulty understanding soft  or whispered voices?:No  Cognitive Testing  Alert? Yes Normal Appearance?Yes  Oriented to person? Yes Place? Yes  Time? no Displays appropriate judgment?Yes  Can read the correct time from a watch face? no Are you having problems remembering things? Yes   Advanced Directives have been discussed with the patient?Yes, son has     List the Names of Other Physician/Practitioners you currently use:  Dr Melvyn Novas (pulmonary)   Indicate any recent Medical Services you may have received from other than [facility] providers in the past year (date may be approximate).   Assessment:    Annual Wellness Exam   Plan:    .  Medicare Attestation  I have personally reviewed:  The patient's medical and social history  Their use of alcohol, tobacco or illicit drugs  Their current medications and supplements  The patient's functional ability including ADLs,fall risks, home safety risks, cognitive, and hearing and visual impairment  Diet and physical activities  Evidence for depression or mood disorders  The patient's weight, height, BMI, and visual acuity have been recorded in the chart. I have made referrals, counseling, and provided education to the patient based on review of the above and I have provided the patient with a written personalized care plan for preventive services.     Review of Systems     Objective:   Physical Exam BP 154/74 mmHg  Pulse 95  Resp 16  Ht 5\' 1"  (1.549 m)  Wt 106 lb (48.081 kg)  BMI 20.04 kg/m2  SpO2 95%  CVS: S1 and S2 , no S3, systolic murmur, no JVD Ext; no edema      Assessment & Plan:  Medicare annual wellness visit, subsequent Annual exam as documented. Counseling done  re healthy lifestyle involving commitment to 150 minutes exercise per week, heart healthy diet, and attaining healthy weight.The importance of adequate sleep also discussed. Regular seat belt use and home safety, is also discussed. Changes in health habits are decided on by the patient  with goals and time frames  set for achieving them. Immunization and cancer screening needs are specifically addressed at this visit.   Essential hypertension, benign Uncontrolled, increase dose of cozaar, nurse BP re eval in 6 to 8 weeks Needs to reduce salt intake also  Reduced vision Markedly reduced left vision, pt declinsd opthalmology eval at this time, will re address at next visit as it relates to safety

## 2015-04-28 NOTE — Patient Instructions (Signed)
Nurse BP check in 6 weeks, call if you need me sooner  MD follow up with rectal in early September  You are doing very well  Vision is poor recommend eye exam  BP is high, increase cozaar to ONE daily  Fasting labs as soon as possible  Fall Prevention in the Home  Falls can cause injuries. They can happen to people of all ages. There are many things you can do to make your home safe and to help prevent falls.  WHAT CAN I DO ON THE OUTSIDE OF MY HOME?  Regularly fix the edges of walkways and driveways and fix any cracks.  Remove anything that might make you trip as you walk through a door, such as a raised step or threshold.  Trim any bushes or trees on the path to your home.  Use bright outdoor lighting.  Clear any walking paths of anything that might make someone trip, such as rocks or tools.  Regularly check to see if handrails are loose or broken. Make sure that both sides of any steps have handrails.  Any raised decks and porches should have guardrails on the edges.  Have any leaves, snow, or ice cleared regularly.  Use sand or salt on walking paths during winter.  Clean up any spills in your garage right away. This includes oil or grease spills. WHAT CAN I DO IN THE BATHROOM?   Use night lights.  Install grab bars by the toilet and in the tub and shower. Do not use towel bars as grab bars.  Use non-skid mats or decals in the tub or shower.  If you need to sit down in the shower, use a plastic, non-slip stool.  Keep the floor dry. Clean up any water that spills on the floor as soon as it happens.  Remove soap buildup in the tub or shower regularly.  Attach bath mats securely with double-sided non-slip rug tape.  Do not have throw rugs and other things on the floor that can make you trip. WHAT CAN I DO IN THE BEDROOM?  Use night lights.  Make sure that you have a light by your bed that is easy to reach.  Do not use any sheets or blankets that are too big  for your bed. They should not hang down onto the floor.  Have a firm chair that has side arms. You can use this for support while you get dressed.  Do not have throw rugs and other things on the floor that can make you trip. WHAT CAN I DO IN THE KITCHEN?  Clean up any spills right away.  Avoid walking on wet floors.  Keep items that you use a lot in easy-to-reach places.  If you need to reach something above you, use a strong step stool that has a grab bar.  Keep electrical cords out of the way.  Do not use floor polish or wax that makes floors slippery. If you must use wax, use non-skid floor wax.  Do not have throw rugs and other things on the floor that can make you trip. WHAT CAN I DO WITH MY STAIRS?  Do not leave any items on the stairs.  Make sure that there are handrails on both sides of the stairs and use them. Fix handrails that are broken or loose. Make sure that handrails are as long as the stairways.  Check any carpeting to make sure that it is firmly attached to the stairs. Fix any carpet that is  loose or worn.  Avoid having throw rugs at the top or bottom of the stairs. If you do have throw rugs, attach them to the floor with carpet tape.  Make sure that you have a light switch at the top of the stairs and the bottom of the stairs. If you do not have them, ask someone to add them for you. WHAT ELSE CAN I DO TO HELP PREVENT FALLS?  Wear shoes that:  Do not have high heels.  Have rubber bottoms.  Are comfortable and fit you well.  Are closed at the toe. Do not wear sandals.  If you use a stepladder:  Make sure that it is fully opened. Do not climb a closed stepladder.  Make sure that both sides of the stepladder are locked into place.  Ask someone to hold it for you, if possible.  Clearly mark and make sure that you can see:  Any grab bars or handrails.  First and last steps.  Where the edge of each step is.  Use tools that help you move around  (mobility aids) if they are needed. These include:  Canes.  Walkers.  Scooters.  Crutches.  Turn on the lights when you go into a dark area. Replace any light bulbs as soon as they burn out.  Set up your furniture so you have a clear path. Avoid moving your furniture around.  If any of your floors are uneven, fix them.  If there are any pets around you, be aware of where they are.  Review your medicines with your doctor. Some medicines can make you feel dizzy. This can increase your chance of falling. Ask your doctor what other things that you can do to help prevent falls.   This information is not intended to replace advice given to you by your health care provider. Make sure you discuss any questions you have with your health care provider.   Document Released: 12/03/2008 Document Revised: 06/23/2014 Document Reviewed: 03/13/2014 Elsevier Interactive Patient Education [employer].

## 2015-04-29 DIAGNOSIS — H547 Unspecified visual loss: Secondary | ICD-10-CM | POA: Insufficient documentation

## 2015-04-29 LAB — COMPREHENSIVE METABOLIC PANEL
ALBUMIN: 4.1 g/dL (ref 3.6–5.1)
ALK PHOS: 57 U/L (ref 33–130)
ALT: 10 U/L (ref 6–29)
AST: 22 U/L (ref 10–35)
BILIRUBIN TOTAL: 0.4 mg/dL (ref 0.2–1.2)
BUN: 26 mg/dL — AB (ref 7–25)
CO2: 24 mmol/L (ref 20–31)
CREATININE: 1.41 mg/dL — AB (ref 0.60–0.93)
Calcium: 9.9 mg/dL (ref 8.6–10.4)
Chloride: 105 mmol/L (ref 98–110)
Glucose, Bld: 102 mg/dL — ABNORMAL HIGH (ref 65–99)
Potassium: 5.2 mmol/L (ref 3.5–5.3)
Sodium: 142 mmol/L (ref 135–146)
TOTAL PROTEIN: 7 g/dL (ref 6.1–8.1)

## 2015-04-29 LAB — CBC
HCT: 37.9 % (ref 36.0–46.0)
Hemoglobin: 12.5 g/dL (ref 12.0–15.0)
MCH: 29.1 pg (ref 26.0–34.0)
MCHC: 33 g/dL (ref 30.0–36.0)
MCV: 88.3 fL (ref 78.0–100.0)
MPV: 10.1 fL (ref 8.6–12.4)
Platelets: 429 10*3/uL — ABNORMAL HIGH (ref 150–400)
RBC: 4.29 MIL/uL (ref 3.87–5.11)
RDW: 14.4 % (ref 11.5–15.5)
WBC: 7.7 10*3/uL (ref 4.0–10.5)

## 2015-04-29 LAB — LIPID PANEL
Cholesterol: 206 mg/dL — ABNORMAL HIGH (ref 125–200)
HDL: 69 mg/dL (ref >=46)
LDL Cholesterol: 112 mg/dL (ref <130)
TRIGLYCERIDES: 124 mg/dL (ref <150)
Total CHOL/HDL Ratio: 3 Ratio (ref <=5.0)
VLDL: 25 mg/dL (ref <30)

## 2015-04-29 LAB — FERRITIN: FERRITIN: 45 ng/mL (ref 20–288)

## 2015-04-29 LAB — IRON: Iron: 73 ug/dL (ref 45–160)

## 2015-04-29 LAB — VITAMIN D 25 HYDROXY (VIT D DEFICIENCY, FRACTURES): Vit D, 25-Hydroxy: 69 ng/mL (ref 30–100)

## 2015-04-29 NOTE — Assessment & Plan Note (Signed)

## 2015-04-29 NOTE — Assessment & Plan Note (Signed)
Uncontrolled, increase dose of cozaar, nurse BP re eval in 6 to 8 weeks Needs to reduce salt intake also

## 2015-04-29 NOTE — Assessment & Plan Note (Signed)
Markedly reduced left vision, pt declinsd opthalmology eval at this time, will re address at next visit as it relates to safety

## 2015-06-04 ENCOUNTER — Other Ambulatory Visit: Payer: Self-pay | Admitting: Family Medicine

## 2015-06-08 ENCOUNTER — Other Ambulatory Visit: Payer: Self-pay

## 2015-06-08 MED ORDER — MIRTAZAPINE 7.5 MG PO TABS: 7.5000 mg | ORAL_TABLET | Freq: Every day | ORAL | Status: DC

## 2015-06-10 ENCOUNTER — Other Ambulatory Visit: Payer: Self-pay | Admitting: Family Medicine

## 2015-06-10 ENCOUNTER — Ambulatory Visit: Payer: Medicare Other

## 2015-06-10 VITALS — BP 142/60

## 2015-06-10 DIAGNOSIS — I10 Essential (primary) hypertension: Secondary | ICD-10-CM

## 2015-06-10 NOTE — Progress Notes (Signed)
Patient in for nurse visit for blood pressure.  Blood pressure checked manually, within normal range.  Will continue to take medication as prescribed and keep next scheduled visit

## 2015-06-15 ENCOUNTER — Other Ambulatory Visit: Payer: Self-pay | Admitting: Family Medicine

## 2015-08-03 ENCOUNTER — Other Ambulatory Visit: Payer: Self-pay | Admitting: Family Medicine

## 2015-09-02 ENCOUNTER — Ambulatory Visit (HOSPITAL_COMMUNITY)
Admission: RE | Admit: 2015-09-02 | Discharge: 2015-09-02 | Disposition: A | Discharge status: Home / Self Care | Payer: Medicare Other | Source: Ambulatory Visit | Attending: Family Medicine | Admitting: Family Medicine

## 2015-09-02 ENCOUNTER — Telehealth (INDEPENDENT_AMBULATORY_CARE_PROVIDER_SITE_OTHER): Payer: Medicare Other

## 2015-09-02 ENCOUNTER — Encounter: Payer: Self-pay | Admitting: Family Medicine

## 2015-09-02 ENCOUNTER — Ambulatory Visit (INDEPENDENT_AMBULATORY_CARE_PROVIDER_SITE_OTHER): Payer: Medicare Other | Admitting: Family Medicine

## 2015-09-02 VITALS — BP 122/60 | HR 94 | Resp 16 | Ht 61.0 in | Wt 103.4 lb

## 2015-09-02 DIAGNOSIS — Y92009 Unspecified place in unspecified non-institutional (private) residence as the place of occurrence of the external cause: Secondary | ICD-10-CM

## 2015-09-02 DIAGNOSIS — S99922A Unspecified injury of left foot, initial encounter: Secondary | ICD-10-CM | POA: Diagnosis not present

## 2015-09-02 DIAGNOSIS — I10 Essential (primary) hypertension: Secondary | ICD-10-CM | POA: Diagnosis not present

## 2015-09-02 DIAGNOSIS — Y92099 Unspecified place in other non-institutional residence as the place of occurrence of the external cause: Secondary | ICD-10-CM

## 2015-09-02 DIAGNOSIS — M79672 Pain in left foot: Secondary | ICD-10-CM | POA: Insufficient documentation

## 2015-09-02 DIAGNOSIS — F05 Delirium due to known physiological condition: Secondary | ICD-10-CM | POA: Diagnosis not present

## 2015-09-02 DIAGNOSIS — W19XXXA Unspecified fall, initial encounter: Secondary | ICD-10-CM

## 2015-09-02 LAB — CBC WITH DIFFERENTIAL/PLATELET
BASOS PCT: 0 %
Basophils Absolute: 0 cells/uL (ref 0–200)
EOS ABS: 83 {cells}/uL (ref 15–500)
Eosinophils Relative: 1 %
HCT: 39.8 % (ref 35.0–45.0)
Hemoglobin: 13 g/dL (ref 11.7–15.5)
LYMPHS PCT: 22 %
Lymphs Abs: 1826 cells/uL (ref 850–3900)
MCH: 28.8 pg (ref 27.0–33.0)
MCHC: 32.7 g/dL (ref 32.0–36.0)
MCV: 88.2 fL (ref 80.0–100.0)
MONO ABS: 664 {cells}/uL (ref 200–950)
MONOS PCT: 8 %
MPV: 10.4 fL (ref 7.5–12.5)
NEUTROS ABS: 5727 {cells}/uL (ref 1500–7800)
Neutrophils Relative %: 69 %
PLATELETS: 408 10*3/uL — AB (ref 140–400)
RBC: 4.51 MIL/uL (ref 3.80–5.10)
RDW: 13.9 % (ref 11.0–15.0)
WBC: 8.3 10*3/uL (ref 3.8–10.8)

## 2015-09-02 LAB — POCT URINALYSIS DIPSTICK
BILIRUBIN UA: NEGATIVE
Glucose, UA: NEGATIVE
KETONES UA: NEGATIVE
Leukocytes, UA: NEGATIVE
Nitrite, UA: NEGATIVE
PH UA: 6
Protein, UA: NEGATIVE
RBC UA: NEGATIVE
Spec Grav, UA: 1.025
Urobilinogen, UA: 0.2

## 2015-09-02 NOTE — Telephone Encounter (Signed)
Will come in this evening at 2:15

## 2015-09-02 NOTE — Telephone Encounter (Signed)
Daughter in law does not want to take her to ED states that she doesn't think that she hit head.  Fall was last week.  Urine normal when checked.   Is asking for labs to check for dehydration.    Verbal order for pcp to work in for office visit.

## 2015-09-02 NOTE — Patient Instructions (Addendum)
pls keep September appt , call if you need me sooner  Labs today to assess blood count and hydration, cBC and diff, chem 7 , also X ray of left great toe Pls keep toe clean and dry, may use small amount of topical antibiotic ointment when dressing until skin heals fully  You are referred to PT/OT out patient twice weekly for 6 weeks due to recent fall and high fall risk to help with balance and  strength so less likely to fall again  Please ensure you eat and drink regularly as you should

## 2015-09-02 NOTE — Telephone Encounter (Signed)
pls let them knwo UA is normal , needs to go to ED based on symptoms stated

## 2015-09-03 ENCOUNTER — Telehealth: Payer: Self-pay | Admitting: Family Medicine

## 2015-09-03 LAB — BASIC METABOLIC PANEL
BUN: 29 mg/dL — ABNORMAL HIGH (ref 7–25)
CHLORIDE: 102 mmol/L (ref 98–110)
CO2: 27 mmol/L (ref 20–31)
Calcium: 10.3 mg/dL (ref 8.6–10.4)
Creat: 1.14 mg/dL — ABNORMAL HIGH (ref 0.60–0.93)
Glucose, Bld: 100 mg/dL — ABNORMAL HIGH (ref 65–99)
POTASSIUM: 4.4 mmol/L (ref 3.5–5.3)
Sodium: 143 mmol/L (ref 135–146)

## 2015-09-03 MED ORDER — FENOFIBRIC ACID 135 MG PO CPDR: DELAYED_RELEASE_CAPSULE | ORAL | Status: DC

## 2015-09-03 NOTE — Telephone Encounter (Signed)
Med sent.

## 2015-09-03 NOTE — Telephone Encounter (Signed)
Patients daughter in law is calling in a refill on Choline Fenofibrate (FENOFIBRIC ACID) 135 MG CPDR please advise? She is at [facility] now

## 2015-09-06 NOTE — Progress Notes (Signed)
   [name]     MRN: [id number]      DOB: Oct 07, 1940   HPI [name] is here with a main concern of 2 falls in the past 1 week, uncertain of how she fell, has a bruise on left posterior chest , scarpe on left arm, not infected , and damaged the toenail on her left great toe. During the interview she later reported 3 falls, so unclear ars to the actual number , but falls did occur. There is a question of trauma to the head, however, there is no bruising or skin breakdown of face or scalp. Her daughter in law  Had called in earlier , asking for an  assessment for UTI or dehydration as a cause or effect of her fall, urinalysis done earlier is normal   ROS Denies recent fever or chills. Denies sinus pressure, nasal congestion, ear pain or sore throat. Denies chest congestion, productive cough or wheezing. Denies chest pains, palpitations and leg swelling Denies abdominal pain, nausea, vomiting,diarrhea or constipation.   Denies dysuria, frequency, hesitancy or incontinence.  Denies headaches, seizures, numbness, or tingling. Denies depres   PE  BP 122/60 mmHg  Pulse 94  Resp 16  Ht 5\' 1"  (1.549 m)  Wt 103 lb 6.4 oz (46.902 kg)  BMI 19.55 kg/m2  SpO2 93%  Patient alert  and in no cardiopulmonary distress.  HEENT: No facial asymmetry, EOMI,   oropharynx pink and moist.  Neck supple no JVD, no mass.  Chest: Clear to auscultation bilaterally.  CVS: S1, S2 no murmurs, no S3.Regular rate.  ABD: Soft non tender.   Ext: No edema  MS: Adequate though reduced ROM spine, shoulders, hips and knees.Tender on palpation of left great toe  Skin: abrasion to left great toenail bed with partial nail removal, also abrasion to left arm, and contusion ion left posterior chest wall  Psych: Good eye contact, normal affect. Memory loss baseline due to severe dementia not anxious or depressed appearing.  CNS: CN 2-12 intact, power,  normal throughout.no focal deficits noted.   Assessment &  Plan  Fall at home History of at least one and up to 3 falls in past week. Will order x ray of left 5th toe where pt is tender and with obvious trauma Basic labs CBC and chem 7 to assess hydration and r/o anemia Neuro exam grossly unchanged from baseline,no visible head trauma, no imaging study of skull/brain indicated.Discussed with her daughter and she is fine with this, will call with any new concerns Refer PT due to fall  Essential hypertension, benign Controlled, no change in medication

## 2015-09-06 NOTE — Assessment & Plan Note (Signed)
Controlled, no change in medication  

## 2015-09-06 NOTE — Assessment & Plan Note (Signed)
History of at least one and up to 3 falls in past week. Will order x ray of left 5th toe where pt is tender and with obvious trauma Basic labs CBC and chem 7 to assess hydration and r/o anemia Neuro exam grossly unchanged from baseline,no visible head trauma, no imaging study of skull/brain indicated.Discussed with her daughter and she is fine with this, will call with any new concerns Refer PT due to fall

## 2015-09-19 ENCOUNTER — Other Ambulatory Visit: Payer: Self-pay | Admitting: Family Medicine

## 2015-09-27 ENCOUNTER — Ambulatory Visit (HOSPITAL_COMMUNITY): Payer: Medicare Other | Attending: Family Medicine | Admitting: Physical Therapy

## 2015-09-27 DIAGNOSIS — M6281 Muscle weakness (generalized): Secondary | ICD-10-CM | POA: Diagnosis not present

## 2015-09-27 DIAGNOSIS — Z9181 History of falling: Secondary | ICD-10-CM | POA: Diagnosis not present

## 2015-09-27 DIAGNOSIS — R262 Difficulty in walking, not elsewhere classified: Secondary | ICD-10-CM | POA: Diagnosis not present

## 2015-09-27 DIAGNOSIS — R2681 Unsteadiness on feet: Secondary | ICD-10-CM | POA: Diagnosis not present

## 2015-09-27 NOTE — Patient Instructions (Signed)
   Tandem Stance  Standing at your counter top, place right heel to left toes.  Use the counter top for safey/balance if needed.  Hold for as long as you can, then switch your feet; repeat 3 times each side, 1-2 times per day.    SINGLE LEG STANCE - SLS  Stand on one leg and maintain your balance. You may use your hands intermittently to help your balance at first, but do this as little as possible.  Hold as long as you can 3 times each side, 1-2 times per day.

## 2015-09-27 NOTE — Therapy (Signed)
[place], [place], [address] Phone: [phone]   Fax:  [phone]  Physical Therapy Evaluation  Patient Details  Name: [name] MRN: [id number] Date of Birth: [birth year] Referring Provider: Tula Nakayama   Encounter Date: 09/27/2015      PT End of Session - 09/27/15 1207    Visit Number 1   Number of Visits 12   Date for PT Re-Evaluation 10/18/15   Authorization Type Medicare and [employer]   Authorization Time Period 09/27/15 to 11/08/15   Authorization - Visit Number 1   Authorization - Number of Visits 10   PT Start Time 1118   PT Stop Time 1156   PT Time Calculation (min) 38 min   Activity Tolerance Patient tolerated treatment well   Behavior During Therapy [facility] for tasks assessed/performed      Past Medical History:  Diagnosis Date  . Anxiety   . Aortic stenosis   . COPD with chronic bronchitis ([place])   . Dementia   . Hyperlipidemia   . Hypertension   . Osteoporosis   . Renal insufficiency   . Rheumatic fever   . Sebaceous cyst   . Thyroid disease 2012   hyperthyroid, had abl;ation therapy treated by endo  . Urosepsis 2012   required several hospitalizations for this    Past Surgical History:  Procedure Laterality Date  . AORTIC VALVE REPLACEMENT     78mm porcine 10/04  . APPENDECTOMY    . BREAST LUMPECTOMY    . CARDIAC VALVE REPLACEMENT    . CHOLECYSTECTOMY    . COLON SURGERY  2005  . COLONOSCOPY  07/23/2008    Single diminutive polyp in the rectum status post cold biopsy removal/ Status post right hemicolectomy/ Colonic ulcers in the upstream end the colon distal to the  anastomosis as described above.  Status post biopsy. The anastomosis itself and neoterminal ileum appeared normal  . ESOPHAGOGASTRODUODENOSCOPY  10/12/09   short segment Barrett's esophage/small hiatal hernia,antral ersions  . LUMBAR LAMINECTOMY    . TONSILLECTOMY      There were no vitals filed for  this visit.       Subjective Assessment - 09/27/15 1125    Subjective Patient arrives stating that she fell in the hall, she was trying to get to the bathroom and she had some trouble on a step, tripped and fell. No fractures but she was bruised on her toe. She does need help getting up from the floor per family report. Per family, she does fall quite a bit.  Patient states that she can do waht she needs to do, family states taht she has trouble getting up.    Patient Stated Goals reduce/prevent further falls, improve mobility   Currently in Pain? No/denies            [facility] PT Assessment - 09/27/15 0001      Assessment   Medical Diagnosis fall at home    Referring Provider Tula Nakayama    Onset Date/Surgical Date 09/10/15  chronic falls    Next MD Visit October with Dr. Moshe Cipro      Precautions   Precautions Fall     Balance Screen   Has the patient fallen in the past 6 months Yes   How many times? 1   Has the patient had a decrease in activity level because of a fear of falling?  No   Is the patient reluctant to leave their home because of  a fear of falling?  No     Prior Function   Level of Independence Needs assistance with ADLs;Needs assistance with homemaking;Independent with gait;Independent with transfers   Vocation Retired     Strength   Right Hip Flexion 4+/5   Right Hip ABduction 4/5   Left Hip Flexion 4+/5   Left Hip ABduction 4+/5   Right Knee Flexion 3+/5   Right Knee Extension 5/5   Left Knee Flexion 3+/5   Left Knee Extension 4+/5   Right Ankle Dorsiflexion 5/5   Left Ankle Dorsiflexion 5/5     Ambulation/Gait   Gait Comments flexed at hips, reduced rotation of trunk/pelvis during gait, reduced gait speed, proximal muscle weakness      6 minute walk test results    Aerobic Endurance Distance Walked 256   Endurance additional comments 3MWT, rollator      High Level Balance   High Level Balance Comments TUG 25 with rollator, SLS 2 seconds max  B                            PT Education - 09/27/15 1206    Education provided Yes   Education Details prognosis, POC, HEP (only to be done with family present)   Person(s) Educated Patient   Methods Explanation   Comprehension Verbalized understanding          PT Short Term Goals - 09/27/15 1218      PT SHORT TERM GOAL #1   Title Patient to be able to ambulate at least 450ft with rollator in order to demonstrate improved mobility and functional activity tolerance    Time 3   Period Weeks   Status New     PT SHORT TERM GOAL #2   Title Patient's family to be able to accurately describe 5/5 ways to make household safer as well as safety tips in general to improve safety of general environmen tat home to reduce fall risk    Time 3   Period Weeks   Status New     PT SHORT TERM GOAL #3   Title Patient to be able to complete TUG test in 15 seconds or less with rollator in order to demonstrate improved functional balance    Time 3   Period Weeks   Status New     PT SHORT TERM GOAL #4   Title Patient to be independent in HEP with family supervision, to be updated PRN    Time 3   Period Weeks   Status New           PT Long Term Goals - 09/27/15 1220      PT LONG TERM GOAL #1   Title Patient to demonstrate strength 5/5 in order to improve mobilty and reduce fall risk    Time 6   Period Weeks   Status New     PT LONG TERM GOAL #2   Title Patient to be able to maintain SLS at least 20 seconds each LE to demonstrate overall improved balance and reduced fall risk    Time 6   Period Weeks   Status New     PT LONG TERM GOAL #3   Title Patient/family to report no falls or LOB within the past 3 weeks in order to demonstrate improved function and safety at home    Time 6   Period Weeks   Status New     PT LONG TERM  GOAL #4   Title Patient to be able to safely ascend/descend at least 4 stairs with U railing, good eccentric control, and minimal  unsteadiness in order to demonstrate improved safety in mobiltiy    Time 6   Period Weeks   Status New               Plan - 09/27/15 1213    Clinical Impression Statement Patient arrives after having a fall at home approximately 3 weeks ago; family was present throughout the evaluation and report that the patient has had multiple falls in her past, is unable to get herself up from the floor, and that the patient is home alone part of the day. Upon examination, patient reveals functional muscle weakness, gait and postural deficits, and significant difficulty with functional balance tasks, indicating high fall risk. Patient does have dementia however appears to respond well to simple instructions; family indicates that she is DNR. Patient will benefit from skilled PT services in order to address functional limitations, reduce fall risk, and reach optimal level of function.    Rehab Potential Good   Clinical Impairments Affecting Rehab Potential dementia   PT Frequency 2x / week   PT Duration 6 weeks   PT Treatment/Interventions ADLs/Self Care Home Management;Biofeedback;DME Instruction;Gait training;Stair training;Functional mobility training;Therapeutic activities;Therapeutic exercise;Balance training;Neuromuscular re-education;Patient/family education;Manual techniques;Passive range of motion;Energy conservation;Taping   PT Next Visit Plan review HEP and goals; focus on functional strength and balance. Safety/environment training with family present.    PT Home Exercise Plan SLS, tandem stance    Consulted and Agree with Plan of Care Patient      Patient will benefit from skilled therapeutic intervention in order to improve the following deficits and impairments:  Abnormal gait, Improper body mechanics, Decreased mobility, Postural dysfunction, Decreased activity tolerance, Decreased strength, Decreased balance, Difficulty walking, Decreased safety awareness, Impaired flexibility, Impaired  vision/preception  Visit Diagnosis: History of falling - Plan: PT plan of care cert/re-cert  Unsteadiness on feet - Plan: PT plan of care cert/re-cert  Difficulty in walking, not elsewhere classified - Plan: PT plan of care cert/re-cert  Muscle weakness (generalized) - Plan: PT plan of care cert/re-cert      G-Codes - [id number] Jun 09, 1221    Functional Assessment Tool Used Based on skilled clinical assessment of strength, gait, balace, safety awareness    Functional Limitation Mobility: Walking and moving around   Mobility: Walking and Moving Around Current Status [id number]) At least 40 percent but less than 60 percent impaired, limited or restricted   Mobility: Walking and Moving Around Goal Status [phone]) At least 20 percent but less than 40 percent impaired, limited or restricted       Problem List Patient Active Problem List   Diagnosis Date Noted  . Reduced vision 04/29/2015  . Medicare annual wellness visit, subsequent 04/28/2015  . Allergic rhinitis 12/29/2014  . GERD (gastroesophageal reflux disease) 10/18/2014  . Protein-calorie malnutrition, severe ([place]) 10/29/2013  . Cerebellar stroke ([place]) 10/29/2013  . Fall at home 10/28/2013  . Dementia with behavioral disturbance 09/08/2012  . Underweight 07/05/2011  . Weakness generalized 09/07/2010  . Lung nodule 02/16/2010  . CONSTIPATION 09/29/2009  . IMPAIRED FASTING GLUCOSE 07/12/2009  . Depression with anxiety 09/09/2008  . AORTIC VALVE DISEASE 08/10/2008  . Insomnia secondary to depression with anxiety 07/27/2008  . OTHER OSTEOPOROSIS 06/09/2008  . UNSPECIFIED DISORDER OF THYROID 06/20/2007  . Essential hypertension, benign 03/15/2007  . Diastolic CHF, chronic ([place]) 03/15/2007  . COPD GOLD III with reversibility  03/15/2007    Deniece Ree PT, DPT [phone]  [place] [address] [place], [place], [address] Phone: [phone]   Fax:  [phone]  Name: [name] MRN: [id number] Date of Birth: 1940/10/26

## 2015-09-29 ENCOUNTER — Ambulatory Visit (HOSPITAL_COMMUNITY): Payer: Medicare Other

## 2015-09-29 ENCOUNTER — Telehealth (HOSPITAL_COMMUNITY): Payer: Self-pay

## 2015-09-29 DIAGNOSIS — Z9181 History of falling: Secondary | ICD-10-CM

## 2015-09-29 DIAGNOSIS — M6281 Muscle weakness (generalized): Secondary | ICD-10-CM

## 2015-09-29 DIAGNOSIS — R2681 Unsteadiness on feet: Secondary | ICD-10-CM

## 2015-09-29 DIAGNOSIS — R262 Difficulty in walking, not elsewhere classified: Secondary | ICD-10-CM | POA: Diagnosis not present

## 2015-09-29 NOTE — Patient Instructions (Signed)
Bridge    Lie back, legs bent. Inhale, pressing hips up. Keeping ribs in, lengthen lower back. Exhale, rolling down along spine from top. Repeat 10 times. Do 1-2 sessions per day.  http://pm.exer.us/55   Copyright  VHI. All rights reserved.   Abduction    Slide one leg out to side. Keep kneecap pointing up. Gently bring leg back to pillow. Repeat with other leg. Repeat 10 times. Do 1-2 sessions per day.  http://gt2.exer.us/374   Copyright  VHI. All rights reserved.   Tandem Stance    Right foot in front of left, heel touching toe both feet "straight ahead". Stand on Foot Triangle of Support with both feet. Balance in this position 30 seconds. Do with left foot in front of right.  Copyright  VHI. All rights reserved.

## 2015-09-29 NOTE — Therapy (Signed)
[place], [place], [address] Phone: [phone]   Fax:  [phone]  Physical Therapy Treatment  Patient Details  Name: [name] MRN: [id number] Date of Birth: Sep 09, 1940 Referring Provider: Tula Nakayama   Encounter Date: 09/29/2015      PT End of Session - 09/29/15 1539    Visit Number 2   Number of Visits 12   Date for PT Re-Evaluation 10/18/15   Authorization Type Medicare and [employer]   Authorization Time Period 09/27/15 to 11/08/15   Authorization - Visit Number 2   Authorization - Number of Visits 10   PT Start Time [id number]   PT Stop Time 1603   PT Time Calculation (min) 46 min   Activity Tolerance Patient tolerated treatment well   Behavior During Therapy [facility] for tasks assessed/performed      Past Medical History:  Diagnosis Date  . Anxiety   . Aortic stenosis   . COPD with chronic bronchitis ([place])   . Dementia   . Hyperlipidemia   . Hypertension   . Osteoporosis   . Renal insufficiency   . Rheumatic fever   . Sebaceous cyst   . Thyroid disease 2012   hyperthyroid, had abl;ation therapy treated by endo  . Urosepsis 2012   required several hospitalizations for this    Past Surgical History:  Procedure Laterality Date  . AORTIC VALVE REPLACEMENT     52mm porcine 10/04  . APPENDECTOMY    . BREAST LUMPECTOMY    . CARDIAC VALVE REPLACEMENT    . CHOLECYSTECTOMY    . COLON SURGERY  2005  . COLONOSCOPY  07/23/2008    Single diminutive polyp in the rectum status post cold biopsy removal/ Status post right hemicolectomy/ Colonic ulcers in the upstream end the colon distal to the  anastomosis as described above.  Status post biopsy. The anastomosis itself and neoterminal ileum appeared normal  . ESOPHAGOGASTRODUODENOSCOPY  10/12/09   short segment Barrett's esophage/small hiatal hernia,antral ersions  . LUMBAR LAMINECTOMY    . TONSILLECTOMY      There were no vitals filed for  this visit.      Subjective Assessment - 09/29/15 1522    Subjective Pt and family reports no recent falls and no reports of pain today.  Pt and family report she had began HEP.     Patient Stated Goals reduce/prevent further falls, improve mobility   Currently in Pain? No/denies                         [facility] Adult PT Treatment/Exercise - 09/29/15 0001      Exercises   Exercises Knee/Hip     Knee/Hip Exercises: Standing   Heel Raises Limitations Toe raises only due to pain on great toe   Knee Flexion 10 reps   Knee Flexion Limitations therapist facilitaiton for proper form   Other Standing Knee Exercises tandem stance 2x 30"     Knee/Hip Exercises: Supine   Hip Adduction Isometric Both;2 sets;10 reps   Hip Adduction Isometric Limitations Abduction supine with cueing for form   Bridges 2 sets;10 reps     Knee/Hip Exercises: Sidelying   Clams 10x bil                PT Education - 09/29/15 1541    Education provided Yes   Education Details Reviewed goals, copy of eval given; Reviewed/educated family safety household; established HEP  Person(s) Educated Patient;Child(ren)   Methods Explanation;Demonstration;Tactile cues;Verbal cues;Handout   Comprehension Verbalized understanding;Returned demonstration;Need further instruction          PT Short Term Goals - 09/27/15 1218      PT SHORT TERM GOAL #1   Title Patient to be able to ambulate at least 486ft with rollator in order to demonstrate improved mobility and functional activity tolerance    Time 3   Period Weeks   Status New     PT SHORT TERM GOAL #2   Title Patient's family to be able to accurately describe 5/5 ways to make household safer as well as safety tips in general to improve safety of general environmen tat home to reduce fall risk    Time 3   Period Weeks   Status New     PT SHORT TERM GOAL #3   Title Patient to be able to complete TUG test in 15 seconds or less with rollator  in order to demonstrate improved functional balance    Time 3   Period Weeks   Status New     PT SHORT TERM GOAL #4   Title Patient to be independent in HEP with family supervision, to be updated PRN    Time 3   Period Weeks   Status New           PT Long Term Goals - 09/27/15 1220      PT LONG TERM GOAL #1   Title Patient to demonstrate strength 5/5 in order to improve mobilty and reduce fall risk    Time 6   Period Weeks   Status New     PT LONG TERM GOAL #2   Title Patient to be able to maintain SLS at least 20 seconds each LE to demonstrate overall improved balance and reduced fall risk    Time 6   Period Weeks   Status New     PT LONG TERM GOAL #3   Title Patient/family to report no falls or LOB within the past 3 weeks in order to demonstrate improved function and safety at home    Time 6   Period Weeks   Status New     PT LONG TERM GOAL #4   Title Patient to be able to safely ascend/descend at least 4 stairs with U railing, good eccentric control, and minimal unsteadiness in order to demonstrate improved safety in mobiltiy    Time 6   Period Weeks   Status New               Plan - 09/29/15 1631    Clinical Impression Statement Reviewed goals, compliance and technique with HEP and copy of evaluation given to pt and family.  Reviewed 5 household safety techniques to complete at home with family to prevent falls with verbal understanding.  Family reports difficulty with current HEP for proper stance, reviewed form with ability to complete though did require moderate cueing and min guard for safety.  Pt given basic proximal strengthening HEP to complete [place], pt able to demonstrate and verbalize technique.  Do plan to progress to [facility] balance training when able.  No reports of pain through session, pt was limited by fatigue.     Rehab Potential Good   Clinical Impairments Affecting Rehab Potential dementia   PT Frequency 2x / week   PT Duration 6 weeks   PT  Treatment/Interventions ADLs/Self Care Home Management;Biofeedback;DME Instruction;Gait training;Stair training;Functional mobility training;Therapeutic activities;Therapeutic exercise;Balance training;Neuromuscular re-education;Patient/family education;Manual techniques;Passive  range of motion;Energy conservation;Taping   PT Next Visit Plan focus on functional strength and balance. Safety/environment training with family present.    PT Home Exercise Plan Given      Patient will benefit from skilled therapeutic intervention in order to improve the following deficits and impairments:  Abnormal gait, Improper body mechanics, Decreased mobility, Postural dysfunction, Decreased activity tolerance, Decreased strength, Decreased balance, Difficulty walking, Decreased safety awareness, Impaired flexibility, Impaired vision/preception  Visit Diagnosis: History of falling  Unsteadiness on feet  Difficulty in walking, not elsewhere classified  Muscle weakness (generalized)     Problem List Patient Active Problem List   Diagnosis Date Noted  . Reduced vision 04/29/2015  . Medicare annual wellness visit, subsequent 04/28/2015  . Allergic rhinitis 12/29/2014  . GERD (gastroesophageal reflux disease) 10/18/2014  . Protein-calorie malnutrition, severe ([place]) 10/29/2013  . Cerebellar stroke ([place]) 10/29/2013  . Fall at home 10/28/2013  . Dementia with behavioral disturbance 09/08/2012  . Underweight 07/05/2011  . Weakness generalized 09/07/2010  . Lung nodule 02/16/2010  . CONSTIPATION 09/29/2009  . IMPAIRED FASTING GLUCOSE 07/12/2009  . Depression with anxiety 09/09/2008  . AORTIC VALVE DISEASE 08/10/2008  . Insomnia secondary to depression with anxiety 07/27/2008  . OTHER OSTEOPOROSIS 06/09/2008  . UNSPECIFIED DISORDER OF THYROID 06/20/2007  . Essential hypertension, benign 03/15/2007  . Diastolic CHF, chronic ([place]) 03/15/2007  . COPD GOLD III with reversibility  03/15/2007   Ihor Austin, LPTA; [place]  Aldona Lento 09/29/2015, 6:01 PM  [facility] [address] [place], [place], [address] Phone: [phone]   Fax:  [phone]  Name: [name] MRN: [id number] Date of Birth: 1940/11/20

## 2015-09-29 NOTE — Telephone Encounter (Signed)
Called and left message regarding an apt opening today at 3:15 instead of schedulded 5:30, contact info given.  [address] [place]; [place] [phone]

## 2015-10-04 ENCOUNTER — Ambulatory Visit (HOSPITAL_COMMUNITY): Payer: Medicare Other

## 2015-10-04 VITALS — HR 118

## 2015-10-04 DIAGNOSIS — R2681 Unsteadiness on feet: Secondary | ICD-10-CM

## 2015-10-04 DIAGNOSIS — M6281 Muscle weakness (generalized): Secondary | ICD-10-CM | POA: Diagnosis not present

## 2015-10-04 DIAGNOSIS — R262 Difficulty in walking, not elsewhere classified: Secondary | ICD-10-CM | POA: Diagnosis not present

## 2015-10-04 DIAGNOSIS — Z9181 History of falling: Secondary | ICD-10-CM | POA: Diagnosis not present

## 2015-10-04 NOTE — Therapy (Signed)
[place], [place], [address] Phone: [phone]   Fax:  [phone]  Physical Therapy Treatment  Patient Details  Name: [name] MRN: [id number] Date of Birth: May 30, 1940 Referring Provider: Tula Nakayama   Encounter Date: 10/04/2015      PT End of Session - 10/04/15 1606    Visit Number 3   Number of Visits 12   Date for PT Re-Evaluation 10/18/15   Authorization Type Medicare and [employer]   Authorization Time Period 09/27/15 to 11/08/15   Authorization - Visit Number 3   Authorization - Number of Visits 10   PT Start Time 1520   PT Stop Time 1600   PT Time Calculation (min) 40 min   Equipment Utilized During Treatment Gait belt   Activity Tolerance Patient tolerated treatment well;Patient limited by fatigue   Behavior During Therapy [facility] for tasks assessed/performed      Past Medical History:  Diagnosis Date  . Anxiety   . Aortic stenosis   . COPD with chronic bronchitis ([place])   . Dementia   . Hyperlipidemia   . Hypertension   . Osteoporosis   . Renal insufficiency   . Rheumatic fever   . Sebaceous cyst   . Thyroid disease 2012   hyperthyroid, had abl;ation therapy treated by endo  . Urosepsis 2012   required several hospitalizations for this    Past Surgical History:  Procedure Laterality Date  . AORTIC VALVE REPLACEMENT     25mm porcine 10/04  . APPENDECTOMY    . BREAST LUMPECTOMY    . CARDIAC VALVE REPLACEMENT    . CHOLECYSTECTOMY    . COLON SURGERY  2005  . COLONOSCOPY  07/23/2008    Single diminutive polyp in the rectum status post cold biopsy removal/ Status post right hemicolectomy/ Colonic ulcers in the upstream end the colon distal to the  anastomosis as described above.  Status post biopsy. The anastomosis itself and neoterminal ileum appeared normal  . ESOPHAGOGASTRODUODENOSCOPY  10/12/09   short segment Barrett's esophage/small hiatal hernia,antral ersions  .  LUMBAR LAMINECTOMY    . TONSILLECTOMY      Vitals:   10/04/15 1603  Pulse: (!) 118  SpO2: 92%        Subjective Assessment - 10/04/15 1525    Subjective Pt doing well today, she has gotten to start some of the HEP, but not all of them yet. Daughter in law assists PT with subjective report due to some memory problems. Pt/family have been focused on increasing water intake at home.    Patient is accompained by: Family member  daughter in law   Patient Stated Goals reduce/prevent further falls, improve mobility   Currently in Pain? No/denies                         [facility] Adult PT Treatment/Exercise - 10/04/15 0001      Exercises   Exercises Knee/Hip     Knee/Hip Exercises: Standing   Heel Raises Limitations 2x10, forefoot on airex  2x10 heels standing on decline   Other Standing Knee Exercises tandem stance 2x 30"     Knee/Hip Exercises: Supine   Heel Slides Both;1 set;15 reps   Bridges 10 reps;1 set     Knee/Hip Exercises: Sidelying   Clams 10x bil             Balance Exercises - 10/04/15 1550      Balance Exercises:  Standing   Standing Eyes Opened Other (comment);Narrow base of support (BOS)  ball toss to self x10; ball self to other x10   Turning Both  seated rotation + reach c ball x10 bil; overhead x10           PT Education - 10/04/15 1606    Education provided No          PT Short Term Goals - 09/27/15 1218      PT SHORT TERM GOAL #1   Title Patient to be able to ambulate at least 433ft with rollator in order to demonstrate improved mobility and functional activity tolerance    Time 3   Period Weeks   Status New     PT SHORT TERM GOAL #2   Title Patient's family to be able to accurately describe 5/5 ways to make household safer as well as safety tips in general to improve safety of general environmen tat home to reduce fall risk    Time 3   Period Weeks   Status New     PT SHORT TERM GOAL #3   Title Patient to be able  to complete TUG test in 15 seconds or less with rollator in order to demonstrate improved functional balance    Time 3   Period Weeks   Status New     PT SHORT TERM GOAL #4   Title Patient to be independent in HEP with family supervision, to be updated PRN    Time 3   Period Weeks   Status New           PT Long Term Goals - 09/27/15 1220      PT LONG TERM GOAL #1   Title Patient to demonstrate strength 5/5 in order to improve mobilty and reduce fall risk    Time 6   Period Weeks   Status New     PT LONG TERM GOAL #2   Title Patient to be able to maintain SLS at least 20 seconds each LE to demonstrate overall improved balance and reduced fall risk    Time 6   Period Weeks   Status New     PT LONG TERM GOAL #3   Title Patient/family to report no falls or LOB within the past 3 weeks in order to demonstrate improved function and safety at home    Time 6   Period Weeks   Status New     PT LONG TERM GOAL #4   Title Patient to be able to safely ascend/descend at least 4 stairs with U railing, good eccentric control, and minimal unsteadiness in order to demonstrate improved safety in mobiltiy    Time 6   Period Weeks   Status New               Plan - 10/04/15 1607    Clinical Impression Statement Pt making progress toward goals, able to participate in dynamic balance activies today, without LOB noted. Pt is noted to be tachypnic, requiring some sitting rest breaks between efforts, SaO2: 92-97% and HR 108-120bpm throughout. Pt requires some tactile cues for set up of certain exercises; follows visual cues well.    Rehab Potential Good   Clinical Impairments Affecting Rehab Potential dementia, CLD, rigid kyphosis   PT Frequency 2x / week   PT Duration 6 weeks   PT Treatment/Interventions ADLs/Self Care Home Management;Biofeedback;DME Instruction;Gait training;Stair training;Functional mobility training;Therapeutic activities;Therapeutic exercise;Balance  training;Neuromuscular re-education;Patient/family education;Manual techniques;Passive range of motion;Energy conservation;Taping  PT Next Visit Plan Continue with heel slides (with redTB as needed); standing marching, progress standing dynamic balance; consider some airex activity.    PT Home Exercise Plan No updates    Consulted and Agree with Plan of Care Patient      Patient will benefit from skilled therapeutic intervention in order to improve the following deficits and impairments:  Abnormal gait, Improper body mechanics, Decreased mobility, Postural dysfunction, Decreased activity tolerance, Decreased strength, Decreased balance, Difficulty walking, Decreased safety awareness, Impaired flexibility, Impaired vision/preception  Visit Diagnosis: History of falling  Unsteadiness on feet  Difficulty in walking, not elsewhere classified  Muscle weakness (generalized)     Problem List Patient Active Problem List   Diagnosis Date Noted  . Reduced vision 04/29/2015  . Medicare annual wellness visit, subsequent 04/28/2015  . Allergic rhinitis 12/29/2014  . GERD (gastroesophageal reflux disease) 10/18/2014  . Protein-calorie malnutrition, severe ([place]) 10/29/2013  . Cerebellar stroke ([place]) 10/29/2013  . Fall at home 10/28/2013  . Dementia with behavioral disturbance 09/08/2012  . Underweight 07/05/2011  . Weakness generalized 09/07/2010  . Lung nodule 02/16/2010  . CONSTIPATION 09/29/2009  . IMPAIRED FASTING GLUCOSE 07/12/2009  . Depression with anxiety 09/09/2008  . AORTIC VALVE DISEASE 08/10/2008  . Insomnia secondary to depression with anxiety 07/27/2008  . OTHER OSTEOPOROSIS 06/09/2008  . UNSPECIFIED DISORDER OF THYROID 06/20/2007  . Essential hypertension, benign 03/15/2007  . Diastolic CHF, chronic ([place]) 03/15/2007  . COPD GOLD III with reversibility  03/15/2007    4:17 PM, 10/04/15 Etta Grandchild, PT, DPT Physical Therapist at [facility] [phone] (office)     [facility] [address] [place], [place], [address] Phone: [phone]   Fax:  [phone]  Name: [name] MRN: [id number] Date of Birth: [birth year]

## 2015-10-07 ENCOUNTER — Ambulatory Visit (HOSPITAL_COMMUNITY): Payer: Medicare Other | Admitting: Physical Therapy

## 2015-10-07 DIAGNOSIS — R262 Difficulty in walking, not elsewhere classified: Secondary | ICD-10-CM

## 2015-10-07 DIAGNOSIS — Z9181 History of falling: Secondary | ICD-10-CM

## 2015-10-07 DIAGNOSIS — R2681 Unsteadiness on feet: Secondary | ICD-10-CM | POA: Diagnosis not present

## 2015-10-07 DIAGNOSIS — M6281 Muscle weakness (generalized): Secondary | ICD-10-CM

## 2015-10-07 NOTE — Therapy (Signed)
[place], [place], [address] Phone: [phone]   Fax:  [phone]  Physical Therapy Treatment  Patient Details  Name: [name] MRN: [id number] Date of Birth: 09/17/1940 Referring Provider: Tula Nakayama   Encounter Date: 10/07/2015      PT End of Session - 10/07/15 1743    Visit Number 4   Number of Visits 12   Date for PT Re-Evaluation 10/18/15   Authorization Type Medicare and [employer]   Authorization Time Period 09/27/15 to 11/08/15   Authorization - Visit Number 4   Authorization - Number of Visits 10   PT Start Time [id number]   PT Stop Time 1729   PT Time Calculation (min) 44 min   Equipment Utilized During Treatment Gait belt   Activity Tolerance Patient tolerated treatment well;Patient limited by fatigue   Behavior During Therapy [facility] for tasks assessed/performed      Past Medical History:  Diagnosis Date  . Anxiety   . Aortic stenosis   . COPD with chronic bronchitis ([place])   . Dementia   . Hyperlipidemia   . Hypertension   . Osteoporosis   . Renal insufficiency   . Rheumatic fever   . Sebaceous cyst   . Thyroid disease 2012   hyperthyroid, had abl;ation therapy treated by endo  . Urosepsis 2012   required several hospitalizations for this    Past Surgical History:  Procedure Laterality Date  . AORTIC VALVE REPLACEMENT     55mm porcine 10/04  . APPENDECTOMY    . BREAST LUMPECTOMY    . CARDIAC VALVE REPLACEMENT    . CHOLECYSTECTOMY    . COLON SURGERY  2005  . COLONOSCOPY  07/23/2008    Single diminutive polyp in the rectum status post cold biopsy removal/ Status post right hemicolectomy/ Colonic ulcers in the upstream end the colon distal to the  anastomosis as described above.  Status post biopsy. The anastomosis itself and neoterminal ileum appeared normal  . ESOPHAGOGASTRODUODENOSCOPY  10/12/09   short segment Barrett's esophage/small hiatal hernia,antral ersions  .  LUMBAR LAMINECTOMY    . TONSILLECTOMY      There were no vitals filed for this visit.      Subjective Assessment - 10/07/15 1650    Subjective Pt reports today is not good, and all days are bad. She has been doing her exercises. No other complaints.   Patient is accompained by: Family member  daughter in law   Patient Stated Goals reduce/prevent further falls, improve mobility   Currently in Pain? No/denies                         [facility] Adult PT Treatment/Exercise - 10/07/15 0001      Knee/Hip Exercises: Seated   Sit to Sand 3 sets  x8 reps each set, HR up to 123     Knee/Hip Exercises: Supine   Heel Slides Both;1 set;15 reps   Heel Slides Limitations red TB   Bridges Both;2 sets;15 reps   Other Supine Knee/Hip Exercises clamshells 2x15 with red TB              Balance Exercises - 10/07/15 1719      Balance Exercises: Standing   Standing Eyes Opened Narrow base of support (BOS);Other (comment)  trunk rotation Lt/Rt x10 reps   Standing Eyes Closed Narrow base of support (BOS);Solid surface;3 reps;30 secs   Tandem Stance 3 reps;30 secs;Eyes open  PT Education - 10/07/15 1742    Education provided Yes   Education Details technique with therex   Person(s) Educated Patient   Methods Explanation;Demonstration;Verbal cues   Comprehension Tactile cues required;Verbal cues required;Need further instruction          PT Short Term Goals - 09/27/15 1218      PT SHORT TERM GOAL #1   Title Patient to be able to ambulate at least 440ft with rollator in order to demonstrate improved mobility and functional activity tolerance    Time 3   Period Weeks   Status New     PT SHORT TERM GOAL #2   Title Patient's family to be able to accurately describe 5/5 ways to make household safer as well as safety tips in general to improve safety of general environmen tat home to reduce fall risk    Time 3   Period Weeks   Status New     PT SHORT TERM  GOAL #3   Title Patient to be able to complete TUG test in 15 seconds or less with rollator in order to demonstrate improved functional balance    Time 3   Period Weeks   Status New     PT SHORT TERM GOAL #4   Title Patient to be independent in HEP with family supervision, to be updated PRN    Time 3   Period Weeks   Status New           PT Long Term Goals - 09/27/15 1220      PT LONG TERM GOAL #1   Title Patient to demonstrate strength 5/5 in order to improve mobilty and reduce fall risk    Time 6   Period Weeks   Status New     PT LONG TERM GOAL #2   Title Patient to be able to maintain SLS at least 20 seconds each LE to demonstrate overall improved balance and reduced fall risk    Time 6   Period Weeks   Status New     PT LONG TERM GOAL #3   Title Patient/family to report no falls or LOB within the past 3 weeks in order to demonstrate improved function and safety at home    Time 6   Period Weeks   Status New     PT LONG TERM GOAL #4   Title Patient to be able to safely ascend/descend at least 4 stairs with U railing, good eccentric control, and minimal unsteadiness in order to demonstrate improved safety in mobiltiy    Time 6   Period Weeks   Status New               Plan - 10/07/15 1744    Clinical Impression Statement Today's session focused on therex to improve LE strength and balance. Pt demonstrating improvement in static balance without noted unsteadiness and would benefit from progression to more dynamic balance. Upon arrival, SaO2 read 89%, however it remained 91%-97% throughout the majority of the session. HR ranged from 104-124bpm. Will continue with current POC.   Rehab Potential Good   Clinical Impairments Affecting Rehab Potential dementia, CLD, rigid kyphosis   PT Frequency 2x / week   PT Duration 6 weeks   PT Treatment/Interventions ADLs/Self Care Home Management;Biofeedback;DME Instruction;Gait training;Stair training;Functional mobility  training;Therapeutic activities;Therapeutic exercise;Balance training;Neuromuscular re-education;Patient/family education;Manual techniques;Passive range of motion;Energy conservation;Taping   PT Next Visit Plan progress LE strength with increased resistance or more functional style therex; progress standing dynamic balance;  consider some airex activity.    PT Home Exercise Plan No updates    Consulted and Agree with Plan of Care Patient      Patient will benefit from skilled therapeutic intervention in order to improve the following deficits and impairments:  Abnormal gait, Improper body mechanics, Decreased mobility, Postural dysfunction, Decreased activity tolerance, Decreased strength, Decreased balance, Difficulty walking, Decreased safety awareness, Impaired flexibility, Impaired vision/preception  Visit Diagnosis: History of falling  Unsteadiness on feet  Difficulty in walking, not elsewhere classified  Muscle weakness (generalized)     Problem List Patient Active Problem List   Diagnosis Date Noted  . Reduced vision 04/29/2015  . Medicare annual wellness visit, subsequent 04/28/2015  . Allergic rhinitis 12/29/2014  . GERD (gastroesophageal reflux disease) 10/18/2014  . Protein-calorie malnutrition, severe ([place]) 10/29/2013  . Cerebellar stroke ([place]) 10/29/2013  . Fall at home 10/28/2013  . Dementia with behavioral disturbance 09/08/2012  . Underweight 07/05/2011  . Weakness generalized 09/07/2010  . Lung nodule 02/16/2010  . CONSTIPATION 09/29/2009  . IMPAIRED FASTING GLUCOSE 07/12/2009  . Depression with anxiety 09/09/2008  . AORTIC VALVE DISEASE 08/10/2008  . Insomnia secondary to depression with anxiety 07/27/2008  . OTHER OSTEOPOROSIS 06/09/2008  . UNSPECIFIED DISORDER OF THYROID 06/20/2007  . Essential hypertension, benign 03/15/2007  . Diastolic CHF, chronic ([place]) 03/15/2007  . COPD GOLD III with reversibility  03/15/2007    5:49 PM,10/07/15 Elly Modena PT,  DPT [facility] [address] [place], [place], [address] Phone: [phone]   Fax:  [phone]  Name: [name] MRN: [id number] Date of Birth: Sep 18, 1940

## 2015-10-12 ENCOUNTER — Telehealth: Payer: Self-pay

## 2015-10-12 ENCOUNTER — Telehealth (HOSPITAL_COMMUNITY): Payer: Self-pay

## 2015-10-12 ENCOUNTER — Ambulatory Visit (HOSPITAL_COMMUNITY): Payer: Medicare Other

## 2015-10-12 VITALS — HR 129 | Resp 35

## 2015-10-12 DIAGNOSIS — R2681 Unsteadiness on feet: Secondary | ICD-10-CM | POA: Diagnosis not present

## 2015-10-12 DIAGNOSIS — R262 Difficulty in walking, not elsewhere classified: Secondary | ICD-10-CM

## 2015-10-12 DIAGNOSIS — M6281 Muscle weakness (generalized): Secondary | ICD-10-CM

## 2015-10-12 DIAGNOSIS — Z9181 History of falling: Secondary | ICD-10-CM

## 2015-10-12 NOTE — Telephone Encounter (Signed)
I am aware that her daughter in law checks her pulse and oxygen at home also , I think she has a pulse ox Pls contact daughter in law and ask if rapid heart rate has been a concern in recent times explain that pT called, if a concern she may need to see cardiology for evaluation of tachycardia, had  Normal CBC recently. If uncerain offer a nurse viosit for her hR, bP , pulse ox to be checked   ?? / furhter concerns pls ask

## 2015-10-12 NOTE — Therapy (Signed)
[facility] [address] [place], [place], [address] Phone: [phone]   Fax:  [phone]  Physical Therapy Treatment  Patient Details  Name: [name] MRN: [id number] Date of Birth: 12-31-1940 Referring Provider: Syliva Overman   Encounter Date: 10/12/2015      PT End of Session - 10/12/15 1511    Visit Number 5   Number of Visits 12   Date for PT Re-Evaluation 10/18/15   Authorization Type Medicare and [employer]   Authorization Time Period 09/27/15 to 11/08/15   Authorization - Visit Number 5   Authorization - Number of Visits 10   PT Start Time 1434   PT Stop Time 1505  Session ended early due to vitals being outside of safe range.    PT Time Calculation (min) 31 min   Equipment Utilized During Treatment Gait belt   Activity Tolerance Patient tolerated treatment well;Patient limited by fatigue   Behavior During Therapy WFL for tasks assessed/performed      Past Medical History:  Diagnosis Date  . Anxiety   . Aortic stenosis   . COPD with chronic bronchitis (HCC)   . Dementia   . Hyperlipidemia   . Hypertension   . Osteoporosis   . Renal insufficiency   . Rheumatic fever   . Sebaceous cyst   . Thyroid disease 2012   hyperthyroid, had abl;ation therapy treated by endo  . Urosepsis 2012   required several hospitalizations for this    Past Surgical History:  Procedure Laterality Date  . AORTIC VALVE REPLACEMENT     21mm porcine 10/04  . APPENDECTOMY    . BREAST LUMPECTOMY    . CARDIAC VALVE REPLACEMENT    . CHOLECYSTECTOMY    . COLON SURGERY  2005  . COLONOSCOPY  07/23/2008    Single diminutive polyp in the rectum status post cold biopsy removal/ Status post right hemicolectomy/ Colonic ulcers in the upstream end the colon distal to the  anastomosis as described above.  Status post biopsy. The anastomosis itself and neoterminal ileum appeared normal  . ESOPHAGOGASTRODUODENOSCOPY  10/12/09   short  segment Barrett's esophage/small hiatal hernia,antral ersions  . LUMBAR LAMINECTOMY    . TONSILLECTOMY      Vitals:   10/12/15 1509  Pulse: (!) 129  Resp: (!) 35  SpO2: (!) 83%        Subjective Assessment - 10/12/15 1441    Subjective Pt in good spirits today. She reports feeling well. No immediate updates or major complaints. Daughter in law reports HEP is giong well, but fire hydrants are somewhat difficulty.    Patient is accompained by: Family member  DIL remains in lobby   Currently in Pain? No/denies                              Balance Exercises - 10/12/15 1450      Balance Exercises: Standing   Standing Eyes Opened Solid surface;Cognitive challenge  vertical ball toss to self x 60sec   Rebounder Cognitive challenge  sitting on dynadisc x60sec   Retro Gait --  Standing Rebounding off door x30 sec             PT Short Term Goals - 09/27/15 1218      PT SHORT TERM GOAL #1   Title Patient to be able to ambulate at least 494ft with rollator in order to demonstrate improved mobility and functional activity tolerance  Time 3   Period Weeks   Status New     PT SHORT TERM GOAL #2   Title Patient's family to be able to accurately describe 5/5 ways to make household safer as well as safety tips in general to improve safety of general environmen tat home to reduce fall risk    Time 3   Period Weeks   Status New     PT SHORT TERM GOAL #3   Title Patient to be able to complete TUG test in 15 seconds or less with rollator in order to demonstrate improved functional balance    Time 3   Period Weeks   Status New     PT SHORT TERM GOAL #4   Title Patient to be independent in HEP with family supervision, to be updated PRN    Time 3   Period Weeks   Status New           PT Long Term Goals - 09/27/15 1220      PT LONG TERM GOAL #1   Title Patient to demonstrate strength 5/5 in order to improve mobilty and reduce fall risk    Time 6    Period Weeks   Status New     PT LONG TERM GOAL #2   Title Patient to be able to maintain SLS at least 20 seconds each LE to demonstrate overall improved balance and reduced fall risk    Time 6   Period Weeks   Status New     PT LONG TERM GOAL #3   Title Patient/family to report no falls or LOB within the past 3 weeks in order to demonstrate improved function and safety at home    Time 6   Period Weeks   Status New     PT LONG TERM GOAL #4   Title Patient to be able to safely ascend/descend at least 4 stairs with U railing, good eccentric control, and minimal unsteadiness in order to demonstrate improved safety in mobiltiy    Time 6   Period Weeks   Status New               Plan - 10/12/15 1511    Clinical Impression Statement Pt limited by tachypnea today, with SaO2 desaturation to 83% after sustained gait trial, no supplemental O2 brought from home. HR remains elevated as well, between 108-129 today with varios activites. Session ended early for pt safety: will contact cardiologist. Gave education to caregiver to acquire means to monitor these parameters during HEP at home and to avoid vigorous HEP unless she is able to monitor vitals.    Rehab Potential Good   Clinical Impairments Affecting Rehab Potential dementia, CLD, rigid kyphosis, monitor HR/SaO2   PT Frequency 2x / week   PT Duration 6 weeks   PT Treatment/Interventions ADLs/Self Care Home Management;Biofeedback;DME Instruction;Gait training;Stair training;Functional mobility training;Therapeutic activities;Therapeutic exercise;Balance training;Neuromuscular re-education;Patient/family education;Manual techniques;Passive range of motion;Energy conservation;Taping   PT Next Visit Plan Monitor O2/HR; progress LE strength, functional style therex; progress standing dynamic balance; consider some airex activity.    PT Home Exercise Plan No updates    Consulted and Agree with Plan of Care Patient      Patient will  benefit from skilled therapeutic intervention in order to improve the following deficits and impairments:  Abnormal gait, Improper body mechanics, Decreased mobility, Postural dysfunction, Decreased activity tolerance, Decreased strength, Decreased balance, Difficulty walking, Decreased safety awareness, Impaired flexibility, Impaired vision/preception  Visit Diagnosis: History of falling  Unsteadiness on feet  Difficulty in walking, not elsewhere classified  Muscle weakness (generalized)     Problem List Patient Active Problem List   Diagnosis Date Noted  . Reduced vision 04/29/2015  . Medicare annual wellness visit, subsequent 04/28/2015  . Allergic rhinitis 12/29/2014  . GERD (gastroesophageal reflux disease) 10/18/2014  . Protein-calorie malnutrition, severe ([place]) 10/29/2013  . Cerebellar stroke ([place]) 10/29/2013  . Fall at home 10/28/2013  . Dementia with behavioral disturbance 09/08/2012  . Underweight 07/05/2011  . Weakness generalized 09/07/2010  . Lung nodule 02/16/2010  . CONSTIPATION 09/29/2009  . IMPAIRED FASTING GLUCOSE 07/12/2009  . Depression with anxiety 09/09/2008  . AORTIC VALVE DISEASE 08/10/2008  . Insomnia secondary to depression with anxiety 07/27/2008  . OTHER OSTEOPOROSIS 06/09/2008  . UNSPECIFIED DISORDER OF THYROID 06/20/2007  . Essential hypertension, benign 03/15/2007  . Diastolic CHF, chronic ([place]) 03/15/2007  . COPD GOLD III with reversibility  03/15/2007   3:18 PM, 10/12/15 Etta Grandchild, PT, DPT Physical Therapist at [place] [phone] (office)     [facility] [address] [place], [place], [address] Phone: [phone]   Fax:  [phone]  Name: [name] MRN: [id number] Date of Birth: 1940/06/25

## 2015-10-12 NOTE — Telephone Encounter (Signed)
PT contacted patient's PCP:  Relayed vitals from visit. Asking for safe parameters for future sessions. Left details message with front office who plans to relay info to Dr. Moshe Cipro.   3:56 PM, 10/12/15 Etta Grandchild, PT, DPT Physical Therapist at [place] [phone] (office)

## 2015-10-14 NOTE — Telephone Encounter (Signed)
Noted.  Patient has not called with any complaints.

## 2015-10-15 ENCOUNTER — Telehealth (HOSPITAL_COMMUNITY): Payer: Self-pay | Admitting: Physical Therapy

## 2015-10-15 ENCOUNTER — Ambulatory Visit (HOSPITAL_COMMUNITY): Payer: Medicare Other | Admitting: Physical Therapy

## 2015-10-15 NOTE — Telephone Encounter (Signed)
L/m concerning no show. NF 4:10pm

## 2015-10-18 ENCOUNTER — Ambulatory Visit (HOSPITAL_COMMUNITY): Payer: Medicare Other | Admitting: Physical Therapy

## 2015-10-18 VITALS — BP 186/66 | HR 89

## 2015-10-18 DIAGNOSIS — M6281 Muscle weakness (generalized): Secondary | ICD-10-CM

## 2015-10-18 DIAGNOSIS — Z9181 History of falling: Secondary | ICD-10-CM | POA: Diagnosis not present

## 2015-10-18 DIAGNOSIS — R2681 Unsteadiness on feet: Secondary | ICD-10-CM | POA: Diagnosis not present

## 2015-10-18 DIAGNOSIS — R262 Difficulty in walking, not elsewhere classified: Secondary | ICD-10-CM | POA: Diagnosis not present

## 2015-10-18 NOTE — Therapy (Signed)
[place], [place], [address] Phone: [phone]   Fax:  [phone]  Physical Therapy Treatment/Reassessment   Patient Details  Name: [name] MRN: [id number] Date of Birth: 1940/07/15 Referring Provider: Tula Nakayama  Encounter Date: 10/18/2015      PT End of Session - 10/18/15 1650    Visit Number 6   Number of Visits 12   Date for PT Re-Evaluation 11/08/15   Authorization Type Medicare and [employer]   Authorization Time Period 09/27/15 to 11/08/15   Authorization - Visit Number 6   Authorization - Number of Visits 10   PT Start Time 1618  Pt arrived late    PT Stop Time 1647   PT Time Calculation (min) 29 min   Equipment Utilized During Treatment Gait belt   Activity Tolerance Patient tolerated treatment well;Patient limited by fatigue   Behavior During Therapy [facility] for tasks assessed/performed      Past Medical History:  Diagnosis Date  . Anxiety   . Aortic stenosis   . COPD with chronic bronchitis ([place])   . Dementia   . Hyperlipidemia   . Hypertension   . Osteoporosis   . Renal insufficiency   . Rheumatic fever   . Sebaceous cyst   . Thyroid disease 2012   hyperthyroid, had abl;ation therapy treated by endo  . Urosepsis 2012   required several hospitalizations for this    Past Surgical History:  Procedure Laterality Date  . AORTIC VALVE REPLACEMENT     61m porcine 10/04  . APPENDECTOMY    . BREAST LUMPECTOMY    . CARDIAC VALVE REPLACEMENT    . CHOLECYSTECTOMY    . COLON SURGERY  2005  . COLONOSCOPY  07/23/2008    Single diminutive polyp in the rectum status post cold biopsy removal/ Status post right hemicolectomy/ Colonic ulcers in the upstream end the colon distal to the  anastomosis as described above.  Status post biopsy. The anastomosis itself and neoterminal ileum appeared normal  . ESOPHAGOGASTRODUODENOSCOPY  10/12/09   short segment Barrett's esophage/small  hiatal hernia,antral ersions  . LUMBAR LAMINECTOMY    . TONSILLECTOMY      Vitals:   10/18/15 1620  BP: (!) 186/66  Pulse: 89  SpO2: 92%        Subjective Assessment - 10/18/15 1622    Subjective Pt reports she is out of breath today. She was in bed all day and is tired now that she is out.    Patient is accompained by: Family member  [name] remains in lobby   Patient Stated Goals reduce/prevent further falls, improve mobility   Currently in Pain? No/denies            [facility] - 10/18/15 0001      Assessment   Medical Diagnosis fall at home    Referring Provider MTula Nakayama  Onset Date/Surgical Date 09/10/15  chronic falls    Next MD Visit October      Precautions   Precautions Fall     Balance Screen   Has the patient fallen in the past 6 months Yes   How many times? 1   Has the patient had a decrease in activity level because of a fear of falling?  No   Is the patient reluctant to leave their home because of a fear of falling?  No     Prior Function   Level of Independence Needs assistance with ADLs;Needs assistance  with homemaking;Independent with gait;Independent with transfers   Vocation Retired     Strength   Right Hip Flexion 5/5   Right Hip ABduction 4+/5   Left Hip Flexion 4+/5   Left Hip ABduction 4+/5   Right Knee Flexion 4/5  seated   Right Knee Extension 4+/5   Left Knee Flexion 4/5  seated    Left Knee Extension 4+/5   Right Ankle Dorsiflexion 5/5   Left Ankle Dorsiflexion 5/5     Ambulation/Gait   Gait Comments flexed at hips, reduced rotation of trunk/pelvis during gait, reduced gait speed, proximal muscle weakness      6 minute walk test results    Aerobic Endurance Distance Walked 240   Endurance additional comments 3MWT, rollator      High Level Balance   High Level Balance Comments TUG 29 sec with rollator, SLS Lt: 3 seconds max Rt: 17 sec max                              PT Education -  10/18/15 1648    Education provided Yes   Education Details improvements noted and goals   Person(s) Educated Patient   Methods Explanation   Comprehension Verbalized understanding          PT Short Term Goals - 10/18/15 1657      PT SHORT TERM GOAL #1   Title Patient to be able to ambulate at least 475f with rollator in order to demonstrate improved mobility and functional activity tolerance    Baseline 240 ft with rollator    Time 3   Period Weeks   Status Not Met     PT SHORT TERM GOAL #2   Title Patient's family to be able to accurately describe 5/5 ways to make household safer as well as safety tips in general to improve safety of general environmen tat home to reduce fall risk    Time 3   Period Weeks   Status Unable to assess     PT SHORT TERM GOAL #3   Title Patient to be able to complete TUG test in 15 seconds or less with rollator in order to demonstrate improved functional balance    Baseline 29 sec, rollator    Time 3   Period Weeks   Status Not Met     PT SHORT TERM GOAL #4   Title Patient to be independent in HEP with family supervision, to be updated PRN    Time 3   Period Weeks   Status New           PT Long Term Goals - 10/18/15 1658      PT LONG TERM GOAL #1   Title Patient to demonstrate strength 5/5 in order to improve mobilty and reduce fall risk    Time 6   Period Weeks   Status Partially Met     PT LONG TERM GOAL #2   Title Patient to be able to maintain SLS at least 20 seconds each LE to demonstrate overall improved balance and reduced fall risk    Baseline RLE: 3 sec, LLE: 17 sec   Time 6   Period Weeks   Status Partially Met     PT LONG TERM GOAL #3   Title Patient/family to report no falls or LOB within the past 3 weeks in order to demonstrate improved function and safety at home    Time 6  Period Weeks   Status Unable to assess     PT LONG TERM GOAL #4   Title Patient to be able to safely ascend/descend at least 4 stairs  with U railing, good eccentric control, and minimal unsteadiness in order to demonstrate improved safety in mobiltiy    Baseline B handrails, reciprocal pattern, increased time and CGA   Time 6   Period Weeks   Status Not Met               Plan - 10-20-2015 1651    Clinical Impression Statement Pt arrived late to today's appointment, but reassessment was performed. Vitals prior to start of session noting BP of 189/66 and SaO2 between 92-94%. Pt demonstrates improvements in BLE strength and balance but continues to take increased time to complete TUG and is limited with her distance on 3MWT. Noting drop in SaO2 to 80% during 3 MWT, pt was asked to take a break and SaO2 elevated back to 90% after 1 minute. She would benefit from supplemental O2 during activity and has been encouraged to bring her O2 with her in previous sessions. Will continue with current POC to improve pt strength balance and endurance.   Rehab Potential Good   Clinical Impairments Affecting Rehab Potential dementia, CLD, rigid kyphosis, monitor HR/SaO2   PT Frequency 2x / week   PT Duration 6 weeks   PT Treatment/Interventions ADLs/Self Care Home Management;Biofeedback;DME Instruction;Gait training;Stair training;Functional mobility training;Therapeutic activities;Therapeutic exercise;Balance training;Neuromuscular re-education;Patient/family education;Manual techniques;Passive range of motion;Energy conservation;Taping   PT Next Visit Plan follow up on home O2 unit to session; Monitor O2/HR; progress LE strength, functional style therex; progress standing dynamic balance; consider some airex activity.    PT Home Exercise Plan No updates    Consulted and Agree with Plan of Care Patient      Patient will benefit from skilled therapeutic intervention in order to improve the following deficits and impairments:  Abnormal gait, Improper body mechanics, Decreased mobility, Postural dysfunction, Decreased activity tolerance,  Decreased strength, Decreased balance, Difficulty walking, Decreased safety awareness, Impaired flexibility, Impaired vision/preception  Visit Diagnosis: History of falling  Unsteadiness on feet  Difficulty in walking, not elsewhere classified  Muscle weakness (generalized)       G-Codes - 10-20-15 1700    Functional Assessment Tool Used Based on skilled clinical assessment of strength, gait, balace, safety awareness    Functional Limitation Mobility: Walking and moving around   Mobility: Walking and Moving Around Current Status ([id number]) At least 40 percent but less than 60 percent impaired, limited or restricted   Mobility: Walking and Moving Around Goal Status [phone]) At least 20 percent but less than 40 percent impaired, limited or restricted      Problem List Patient Active Problem List   Diagnosis Date Noted  . Reduced vision 04/29/2015  . Medicare annual wellness visit, subsequent 04/28/2015  . Allergic rhinitis 12/29/2014  . GERD (gastroesophageal reflux disease) 2014-10-20  . Protein-calorie malnutrition, severe ([place]) 10/29/2013  . Cerebellar stroke ([place]) 10/29/2013  . Fall at home 10/28/2013  . Dementia with behavioral disturbance 09/08/2012  . Underweight 07/05/2011  . Weakness generalized 09/07/2010  . Lung nodule 02/16/2010  . CONSTIPATION 09/29/2009  . IMPAIRED FASTING GLUCOSE 07/12/2009  . Depression with anxiety 09/09/2008  . AORTIC VALVE DISEASE 08/10/2008  . Insomnia secondary to depression with anxiety 07/27/2008  . OTHER OSTEOPOROSIS 06/09/2008  . UNSPECIFIED DISORDER OF THYROID 06/20/2007  . Essential hypertension, benign 03/15/2007  . Diastolic CHF, chronic ([place]) 03/15/2007  .  COPD GOLD III with reversibility  03/15/2007    5:11 PM,10/18/15 [name] PT, DPT [facility] [address] [place], [place], [address] Phone: [phone]   Fax:   [phone]  Name: [name] MRN: [id number] Date of Birth: 07-28-1940

## 2015-10-20 ENCOUNTER — Ambulatory Visit (HOSPITAL_COMMUNITY): Payer: Medicare Other

## 2015-10-20 DIAGNOSIS — R2681 Unsteadiness on feet: Secondary | ICD-10-CM | POA: Diagnosis not present

## 2015-10-20 DIAGNOSIS — Z9181 History of falling: Secondary | ICD-10-CM | POA: Diagnosis not present

## 2015-10-20 DIAGNOSIS — R262 Difficulty in walking, not elsewhere classified: Secondary | ICD-10-CM | POA: Diagnosis not present

## 2015-10-20 DIAGNOSIS — M6281 Muscle weakness (generalized): Secondary | ICD-10-CM

## 2015-10-20 NOTE — Therapy (Signed)
[place], [place], [address] Phone: [phone]   Fax:  [phone]  Physical Therapy Treatment  Patient Details  Name: [name] MRN: [id number] Date of Birth: Oct 09, 1940 Referring Provider: Tula Nakayama  Encounter Date: 10/20/2015      PT End of Session - 10/20/15 1614    Visit Number 7   Number of Visits 12   Date for PT Re-Evaluation 11/08/15   Authorization Type Medicare and [employer]   Authorization Time Period 09/27/15 to 11/08/15   Authorization - Visit Number 7   Authorization - Number of Visits 10   PT Start Time 5051   PT Stop Time 8335   PT Time Calculation (min) 40 min   Equipment Utilized During Treatment Gait belt   Activity Tolerance Patient tolerated treatment well;Patient limited by fatigue   Behavior During Therapy [facility] for tasks assessed/performed      Past Medical History:  Diagnosis Date  . Anxiety   . Aortic stenosis   . COPD with chronic bronchitis ([place])   . Dementia   . Hyperlipidemia   . Hypertension   . Osteoporosis   . Renal insufficiency   . Rheumatic fever   . Sebaceous cyst   . Thyroid disease 2012   hyperthyroid, had abl;ation therapy treated by endo  . Urosepsis 2012   required several hospitalizations for this    Past Surgical History:  Procedure Laterality Date  . AORTIC VALVE REPLACEMENT     46m porcine 10/04  . APPENDECTOMY    . BREAST LUMPECTOMY    . CARDIAC VALVE REPLACEMENT    . CHOLECYSTECTOMY    . COLON SURGERY  2005  . COLONOSCOPY  07/23/2008    Single diminutive polyp in the rectum status post cold biopsy removal/ Status post right hemicolectomy/ Colonic ulcers in the upstream end the colon distal to the  anastomosis as described above.  Status post biopsy. The anastomosis itself and neoterminal ileum appeared normal  . ESOPHAGOGASTRODUODENOSCOPY  10/12/09   short segment Barrett's esophage/small hiatal hernia,antral ersions  .  LUMBAR LAMINECTOMY    . TONSILLECTOMY      There were no vitals filed for this visit.      Subjective Assessment - 10/20/15 1604    Subjective Pt reports she is feeling good today, no reports of pain or c/o SOB today.     Patient Stated Goals reduce/prevent further falls, improve mobility   Currently in Pain? No/denies                Balance Exercises - 10/20/15 1636      Balance Exercises: Standing   Tandem Stance Eyes open;Foam/compliant surface;2 reps;30 secs  on Airex   Rebounder Foam/compliant surface;Double leg;10 reps  2 sets on airex with NBOS 2nd set   Retro Gait 1 rep             PT Short Term Goals - 10/18/15 1657      PT SHORT TERM GOAL #1   Title Patient to be able to ambulate at least 4590fwith rollator in order to demonstrate improved mobility and functional activity tolerance    Baseline 240 ft with rollator    Time 3   Period Weeks   Status Not Met     PT SHORT TERM GOAL #2   Title Patient's family to be able to accurately describe 5/5 ways to make household safer as well as safety tips in general to improve safety of  general environmen tat home to reduce fall risk    Time 3   Period Weeks   Status Unable to assess     PT SHORT TERM GOAL #3   Title Patient to be able to complete TUG test in 15 seconds or less with rollator in order to demonstrate improved functional balance    Baseline 29 sec, rollator    Time 3   Period Weeks   Status Not Met     PT SHORT TERM GOAL #4   Title Patient to be independent in HEP with family supervision, to be updated PRN    Time 3   Period Weeks   Status New           PT Long Term Goals - 10/18/15 1658      PT LONG TERM GOAL #1   Title Patient to demonstrate strength 5/5 in order to improve mobilty and reduce fall risk    Time 6   Period Weeks   Status Partially Met     PT LONG TERM GOAL #2   Title Patient to be able to maintain SLS at least 20 seconds each LE to demonstrate overall  improved balance and reduced fall risk    Baseline RLE: 3 sec, LLE: 17 sec   Time 6   Period Weeks   Status Partially Met     PT LONG TERM GOAL #3   Title Patient/family to report no falls or LOB within the past 3 weeks in order to demonstrate improved function and safety at home    Time 6   Period Weeks   Status Unable to assess     PT LONG TERM GOAL #4   Title Patient to be able to safely ascend/descend at least 4 stairs with U railing, good eccentric control, and minimal unsteadiness in order to demonstrate improved safety in mobiltiy    Baseline B handrails, reciprocal pattern, increased time and CGA   Time 6   Period Weeks   Status Not Met               Plan - 10/20/15 1659    Clinical Impression Statement Session focus on improving balance training with addition of dynamic surface.  Vitals were assessed through session with initial BP at 146/61 mmHg, HR at 102bmp with increased to 122bmp and SaO2 range from 90-92% through session.  Incorporated gluteal strengthening with proper lifting of vollleyball with rebounder activity on airex.  Pt limited by fatigue with activities, 2 rest breaks complete through session.     Rehab Potential Good   Clinical Impairments Affecting Rehab Potential dementia, CLD, rigid kyphosis, monitor HR/SaO2   PT Frequency 2x / week   PT Duration 6 weeks   PT Treatment/Interventions ADLs/Self Care Home Management;Biofeedback;DME Instruction;Gait training;Stair training;Functional mobility training;Therapeutic activities;Therapeutic exercise;Balance training;Neuromuscular re-education;Patient/family education;Manual techniques;Passive range of motion;Energy conservation;Taping   PT Next Visit Plan follow up on home O2 unit to session; Monitor O2/HR; progress LE strength, functional style therex; progress standing dynamic balance; consider some airex activity.       Patient will benefit from skilled therapeutic intervention in order to improve the  following deficits and impairments:  Abnormal gait, Improper body mechanics, Decreased mobility, Postural dysfunction, Decreased activity tolerance, Decreased strength, Decreased balance, Difficulty walking, Decreased safety awareness, Impaired flexibility, Impaired vision/preception  Visit Diagnosis: History of falling  Unsteadiness on feet  Difficulty in walking, not elsewhere classified  Muscle weakness (generalized)     Problem List Patient Active Problem List  Diagnosis Date Noted  . Reduced vision 04/29/2015  . Medicare annual wellness visit, subsequent 04/28/2015  . Allergic rhinitis 12/29/2014  . GERD (gastroesophageal reflux disease) 10/18/2014  . Protein-calorie malnutrition, severe ([place]) 10/29/2013  . Cerebellar stroke ([place]) 10/29/2013  . Fall at home 10/28/2013  . Dementia with behavioral disturbance 09/08/2012  . Underweight 07/05/2011  . Weakness generalized 09/07/2010  . Lung nodule 02/16/2010  . CONSTIPATION 09/29/2009  . IMPAIRED FASTING GLUCOSE 07/12/2009  . Depression with anxiety 09/09/2008  . AORTIC VALVE DISEASE 08/10/2008  . Insomnia secondary to depression with anxiety 07/27/2008  . OTHER OSTEOPOROSIS 06/09/2008  . UNSPECIFIED DISORDER OF THYROID 06/20/2007  . Essential hypertension, benign 03/15/2007  . Diastolic CHF, chronic ([place]) 03/15/2007  . COPD GOLD III with reversibility  03/15/2007   Ihor Austin, LPTA; [place]  Aldona Lento 10/20/2015, 5:45 PM  [facility] [address] [place], [place], [address] Phone: [phone]   Fax:  [phone]  Name: INNA TISDELL MRN: [id number] Date of Birth: [birth year]

## 2015-10-26 ENCOUNTER — Ambulatory Visit (HOSPITAL_COMMUNITY): Payer: Medicare Other | Attending: Family Medicine | Admitting: Physical Therapy

## 2015-10-26 DIAGNOSIS — R262 Difficulty in walking, not elsewhere classified: Secondary | ICD-10-CM | POA: Insufficient documentation

## 2015-10-26 DIAGNOSIS — R2681 Unsteadiness on feet: Secondary | ICD-10-CM | POA: Diagnosis not present

## 2015-10-26 DIAGNOSIS — Z9181 History of falling: Secondary | ICD-10-CM | POA: Insufficient documentation

## 2015-10-26 DIAGNOSIS — M6281 Muscle weakness (generalized): Secondary | ICD-10-CM | POA: Insufficient documentation

## 2015-10-26 NOTE — Therapy (Signed)
[place], [place], [address] Phone: [phone]   Fax:  [phone]  Physical Therapy Treatment  Patient Details  Name: [name] MRN: [id number] Date of Birth: 26-Mar-1940 Referring Provider: Tula Nakayama  Encounter Date: 10/26/2015      PT End of Session - 10/26/15 1837    Visit Number 8   Number of Visits 12   Date for PT Re-Evaluation 11/08/15   Authorization Type Medicare and [employer] (G-codes done  visit 6)   Authorization Time Period 09/27/15 to 11/08/15   Authorization - Visit Number 8   Authorization - Number of Visits 16   PT Start Time 5638   PT Stop Time 1428   PT Time Calculation (min) 41 min   Activity Tolerance Patient tolerated treatment well;Patient limited by fatigue   Behavior During Therapy [facility] for tasks assessed/performed      Past Medical History:  Diagnosis Date  . Anxiety   . Aortic stenosis   . COPD with chronic bronchitis ([place])   . Dementia   . Hyperlipidemia   . Hypertension   . Osteoporosis   . Renal insufficiency   . Rheumatic fever   . Sebaceous cyst   . Thyroid disease 2012   hyperthyroid, had abl;ation therapy treated by endo  . Urosepsis 2012   required several hospitalizations for this    Past Surgical History:  Procedure Laterality Date  . AORTIC VALVE REPLACEMENT     3m porcine 10/04  . APPENDECTOMY    . BREAST LUMPECTOMY    . CARDIAC VALVE REPLACEMENT    . CHOLECYSTECTOMY    . COLON SURGERY  2005  . COLONOSCOPY  07/23/2008    Single diminutive polyp in the rectum status post cold biopsy removal/ Status post right hemicolectomy/ Colonic ulcers in the upstream end the colon distal to the  anastomosis as described above.  Status post biopsy. The anastomosis itself and neoterminal ileum appeared normal  . ESOPHAGOGASTRODUODENOSCOPY  10/12/09   short segment Barrett's esophage/small hiatal hernia,antral ersions  . LUMBAR LAMINECTOMY    .  TONSILLECTOMY      There were no vitals filed for this visit.      Subjective Assessment - 10/26/15 1350    Subjective Patient reports she is feeling good, no pain today. Patient not sure what is still hard for her to do, she has not had any falls recently either.    Currently in Pain? No/denies                         [facility] 10[facility]26/15 0001      Ambulation/Gait   Gait Comments forwards/backwards gait approx 364fwith min guard      Knee/Hip Exercises: Seated   Other Seated Knee/Hip Exercises cone reaches while seated on dynamic balance pad 1x10   Sit to Sand 10 reps;without UE support             Balance Exercises - 10/26/15 1431      Balance Exercises: Standing   Tandem Stance Eyes open;Foam/compliant surface;2 reps;15 secs   SLS Eyes open;Solid surface;2 reps;10 secs   Other Standing Exercises rockerboard with mod assist 1x6 reps            PT Education - 10/26/15 1836    Education provided Yes   Education Details encouraged patient to speak with MD regarding pain in chest that she has on and off  Person(s) Educated Patient   Methods Explanation   Comprehension Verbalized understanding          PT Short Term Goals - 10/18/15 1657      PT SHORT TERM GOAL #1   Title Patient to be able to ambulate at least 431f with rollator in order to demonstrate improved mobility and functional activity tolerance    Baseline 240 ft with rollator    Time 3   Period Weeks   Status Not Met     PT SHORT TERM GOAL #2   Title Patient's family to be able to accurately describe 5/5 ways to make household safer as well as safety tips in general to improve safety of general environmen tat home to reduce fall risk    Time 3   Period Weeks   Status Unable to assess     PT SHORT TERM GOAL #3   Title Patient to be able to complete TUG test in 15 seconds or less with rollator in order to demonstrate improved functional balance     Baseline 29 sec, rollator    Time 3   Period Weeks   Status Not Met     PT SHORT TERM GOAL #4   Title Patient to be independent in HEP with family supervision, to be updated PRN    Time 3   Period Weeks   Status New           PT Long Term Goals - 10/18/15 1658      PT LONG TERM GOAL #1   Title Patient to demonstrate strength 5/5 in order to improve mobilty and reduce fall risk    Time 6   Period Weeks   Status Partially Met     PT LONG TERM GOAL #2   Title Patient to be able to maintain SLS at least 20 seconds each LE to demonstrate overall improved balance and reduced fall risk    Baseline RLE: 3 sec, LLE: 17 sec   Time 6   Period Weeks   Status Partially Met     PT LONG TERM GOAL #3   Title Patient/family to report no falls or LOB within the past 3 weeks in order to demonstrate improved function and safety at home    Time 6   Period Weeks   Status Unable to assess     PT LONG TERM GOAL #4   Title Patient to be able to safely ascend/descend at least 4 stairs with U railing, good eccentric control, and minimal unsteadiness in order to demonstrate improved safety in mobiltiy    Baseline B handrails, reciprocal pattern, increased time and CGA   Time 6   Period Weeks   Status Not Met               Plan - 10/26/15 1838    Clinical Impression Statement Patient arrives today very pleasant, states she is feeling good today with no falls recently. Incorporated gait and balance work today with close monitoring of HR secondary to tachycardia at rest and with activity. At one point during session, while performing exercise in parallel bars, patient began to experience chest pain and stated "I need to sit down NOW"; got patient safely into a sitting position and immediately took vitals, with BP at 153/65, O2 88% on RA, and HR 122. Continued to closely monitor patient, as chest pain resolved with seated rest and strong verbal cues for correct respiratory patter/avoidance of  hyperventilation pattern. All symptoms resolved with  seated rest. Performed one last simple balance exercise before ending session today.    Rehab Potential Good   Clinical Impairments Affecting Rehab Potential dementia, CLD, rigid kyphosis, monitor HR/SaO2   PT Frequency 2x / week   PT Duration 6 weeks   PT Treatment/Interventions ADLs/Self Care Home Management;Biofeedback;DME Instruction;Gait training;Stair training;Functional mobility training;Therapeutic activities;Therapeutic exercise;Balance training;Neuromuscular re-education;Patient/family education;Manual techniques;Passive range of motion;Energy conservation;Taping   PT Next Visit Plan follow up on home O2 unit to session; Monitor O2/HR; progress LE strength, functional style therex; progress standing dynamic balance; consider some airex activity.    PT Home Exercise Plan No updates    Consulted and Agree with Plan of Care Patient      Patient will benefit from skilled therapeutic intervention in order to improve the following deficits and impairments:  Abnormal gait, Improper body mechanics, Decreased mobility, Postural dysfunction, Decreased activity tolerance, Decreased strength, Decreased balance, Difficulty walking, Decreased safety awareness, Impaired flexibility, Impaired vision/preception  Visit Diagnosis: History of falling  Unsteadiness on feet  Difficulty in walking, not elsewhere classified  Muscle weakness (generalized)     Problem List Patient Active Problem List   Diagnosis Date Noted  . Reduced vision 04/29/2015  . Medicare annual wellness visit, subsequent 04/28/2015  . Allergic rhinitis 12/29/2014  . GERD (gastroesophageal reflux disease) 10/18/2014  . Protein-calorie malnutrition, severe ([place]) 10/29/2013  . Cerebellar stroke ([place]) 10/29/2013  . Fall at home 10/28/2013  . Dementia with behavioral disturbance 09/08/2012  . Underweight 07/05/2011  . Weakness generalized 09/07/2010  . Lung nodule  02/16/2010  . CONSTIPATION 09/29/2009  . IMPAIRED FASTING GLUCOSE 07/12/2009  . Depression with anxiety 09/09/2008  . AORTIC VALVE DISEASE 08/10/2008  . Insomnia secondary to depression with anxiety 07/27/2008  . OTHER OSTEOPOROSIS 06/09/2008  . UNSPECIFIED DISORDER OF THYROID 06/20/2007  . Essential hypertension, benign 03/15/2007  . Diastolic CHF, chronic ([place]) 03/15/2007  . COPD GOLD III with reversibility  03/15/2007    Deniece Ree PT, DPT [facility] [address] [place], [place], [address] Phone: [phone]   Fax:  [phone]  Name: [name] MRN: [id number] Date of Birth: [birth year]

## 2015-10-28 ENCOUNTER — Encounter: Payer: Self-pay | Admitting: Family Medicine

## 2015-10-28 ENCOUNTER — Ambulatory Visit (INDEPENDENT_AMBULATORY_CARE_PROVIDER_SITE_OTHER): Payer: Medicare Other | Admitting: Family Medicine

## 2015-10-28 VITALS — BP 124/80 | HR 101 | Resp 16 | Ht 61.5 in | Wt 99.0 lb

## 2015-10-28 DIAGNOSIS — F418 Other specified anxiety disorders: Secondary | ICD-10-CM

## 2015-10-28 DIAGNOSIS — Z13220 Encounter for screening for lipoid disorders: Secondary | ICD-10-CM | POA: Diagnosis not present

## 2015-10-28 DIAGNOSIS — R7301 Impaired fasting glucose: Secondary | ICD-10-CM

## 2015-10-28 DIAGNOSIS — I5032 Chronic diastolic (congestive) heart failure: Secondary | ICD-10-CM

## 2015-10-28 DIAGNOSIS — F03918 Unspecified dementia, unspecified severity, with other behavioral disturbance: Secondary | ICD-10-CM

## 2015-10-28 DIAGNOSIS — K219 Gastro-esophageal reflux disease without esophagitis: Secondary | ICD-10-CM

## 2015-10-28 DIAGNOSIS — I10 Essential (primary) hypertension: Secondary | ICD-10-CM

## 2015-10-28 DIAGNOSIS — R531 Weakness: Secondary | ICD-10-CM

## 2015-10-28 DIAGNOSIS — Z1211 Encounter for screening for malignant neoplasm of colon: Secondary | ICD-10-CM | POA: Diagnosis not present

## 2015-10-28 DIAGNOSIS — F0391 Unspecified dementia with behavioral disturbance: Secondary | ICD-10-CM

## 2015-10-28 LAB — CBC
HCT: 38.2 % (ref 35.0–45.0)
Hemoglobin: 12.3 g/dL (ref 11.7–15.5)
MCH: 28.5 pg (ref 27.0–33.0)
MCHC: 32.2 g/dL (ref 32.0–36.0)
MCV: 88.6 fL (ref 80.0–100.0)
MPV: 10 fL (ref 7.5–12.5)
Platelets: 407 10*3/uL — ABNORMAL HIGH (ref 140–400)
RBC: 4.31 MIL/uL (ref 3.80–5.10)
RDW: 14.2 % (ref 11.0–15.0)
WBC: 5.2 10*3/uL (ref 3.8–10.8)

## 2015-10-28 LAB — HEMOCCULT GUIAC POC 1CARD (OFFICE): FECAL OCCULT BLD: NEGATIVE

## 2015-10-28 LAB — COMPREHENSIVE METABOLIC PANEL
ALT: 10 U/L (ref 6–29)
AST: 22 U/L (ref 10–35)
Albumin: 4.1 g/dL (ref 3.6–5.1)
Alkaline Phosphatase: 59 U/L (ref 33–130)
BILIRUBIN TOTAL: 0.4 mg/dL (ref 0.2–1.2)
BUN: 18 mg/dL (ref 7–25)
CO2: 28 mmol/L (ref 20–31)
CREATININE: 0.96 mg/dL — AB (ref 0.60–0.93)
Calcium: 9.9 mg/dL (ref 8.6–10.4)
Chloride: 104 mmol/L (ref 98–110)
GLUCOSE: 110 mg/dL — AB (ref 65–99)
Potassium: 4.3 mmol/L (ref 3.5–5.3)
SODIUM: 144 mmol/L (ref 135–146)
Total Protein: 6.8 g/dL (ref 6.1–8.1)

## 2015-10-28 LAB — LIPID PANEL
Cholesterol: 198 mg/dL (ref 125–200)
HDL: 65 mg/dL (ref >=46)
LDL CALC: 109 mg/dL (ref <130)
Total CHOL/HDL Ratio: 3 Ratio (ref <=5.0)
Triglycerides: 118 mg/dL (ref <150)
VLDL: 24 mg/dL (ref <30)

## 2015-10-28 LAB — TSH: TSH: 0.39 m[IU]/L — AB

## 2015-10-28 NOTE — Patient Instructions (Signed)
F/u in 4 month, call if you need me sooner  No change in medications  Rectal exam today shows no blood in stool  Thank you  for choosing [facility]. We consider it a privelige to serve you.  Delivering excellent health care in a caring and  compassionate way is our goal.  Partnering with you,  so that together we can achieve this goal is our strategy.   [facility] the [facility] that you are accepting today  Labs today please

## 2015-10-28 NOTE — Assessment & Plan Note (Signed)
Controlled, no change in medication  

## 2015-10-28 NOTE — Assessment & Plan Note (Signed)
Clinically stable, no behavioral issues , continue current management

## 2015-10-28 NOTE — Assessment & Plan Note (Signed)
Stable, will benefit from, needs to, and is ready to [facility] to the [facility] for [facility]

## 2015-10-28 NOTE — Assessment & Plan Note (Signed)
Stable at this time, no change in management

## 2015-10-28 NOTE — Assessment & Plan Note (Signed)
Heme negative stool 

## 2015-10-28 NOTE — Assessment & Plan Note (Signed)
Benefiting from physical therapy currently and is to complete program per therapy recommendations

## 2015-10-28 NOTE — Progress Notes (Signed)
   [name]     MRN: [id number]      DOB: 1940-12-28   HPI [name] is here for follow up and re-evaluation of chronic medical conditions, medication management and review of any available recent lab and radiology data.  Preventive health is updated, specifically  Cancer screening and Immunization.  Refuses flu vaccine Benfitting from physical therapy, emotionally, mentally and physically, now willing and ready to go to senior center for more social interaction The PT denies any adverse reactions to current medications since the last visit.  Primarily eating and sleeping, no interest in even watching tV, pt clearly states she is tired of people telling her how she should feel, and is "ready for the miracle" of wanting to make these decisions on her own  ROS Denies recent fever or chills. Denies sinus pressure, nasal congestion, ear pain or sore throat. Denies chest congestion, productive cough or wheezing. Denies chest pains, palpitations and leg swelling Denies abdominal pain, nausea, vomiting,diarrhea or constipation.   GU: denies dysuria, or frequency, has  incontinence.  Denies headaches, seizures, numbness, or tingling. . Denies skin break down or rash.   PE  BP 124/80   Pulse (!) 101   Resp 16   Ht 5' 1.5" (1.562 m)   Wt 99 lb (44.9 kg)   SpO2 92%   BMI 18.40 kg/m   Patient alert and oriented and in no cardiopulmonary distress.  HEENT: No facial asymmetry, EOMI,   oropharynx pink and moist.  Neck supple no JVD, no mass.  Chest: Clear to auscultation bilaterally.Decreased though adequate air entry CVS: S1, S2 no murmurs, no S3.Regular rate.  ABD: Soft non tender.  Rectal: no mass, heme negative stool.  Ext: No edema  MS: decreased ROM spine, hips, shoulders and knees  Skin: Intact, no ulcerations or rash noted.  Psych: Good eye contact, normal affect. Memory  impaired not anxious or depressed appearing.  CNS: CN 2-12 intact, power,  normal throughout.no  focal deficits noted.   Assessment & Plan  Essential hypertension, benign Controlled, no change in medication   Diastolic CHF, chronic Stable at this time, no change in management  Weakness generalized Benefiting from physical therapy currently and is to complete program per therapy recommendations  Depression with anxiety Stable, will benefit from, needs to, and is ready to [facility] to the [facility]  GERD (gastroesophageal reflux disease) Controlled, no change in medication   Dementia with behavioral disturbance Clinically stable, no behavioral issues , continue current management  Special screening for malignant neoplasms, colon Heme negative stool

## 2015-10-29 ENCOUNTER — Ambulatory Visit (HOSPITAL_COMMUNITY): Payer: Medicare Other

## 2015-10-29 DIAGNOSIS — R262 Difficulty in walking, not elsewhere classified: Secondary | ICD-10-CM

## 2015-10-29 DIAGNOSIS — Z9181 History of falling: Secondary | ICD-10-CM

## 2015-10-29 DIAGNOSIS — M6281 Muscle weakness (generalized): Secondary | ICD-10-CM | POA: Diagnosis not present

## 2015-10-29 DIAGNOSIS — R2681 Unsteadiness on feet: Secondary | ICD-10-CM | POA: Diagnosis not present

## 2015-10-29 NOTE — Therapy (Signed)
[place], [place], [address] Phone: [phone]   Fax:  [phone]  Physical Therapy Treatment  Patient Details  Name: [name] MRN: [id number] Date of Birth: Jun 29, 1940 Referring Provider: Tula Nakayama  Encounter Date: 10/29/2015      PT End of Session - 10/29/15 1130    Visit Number 9   Number of Visits 12   Date for PT Re-Evaluation 11/08/15   Authorization Type Medicare and [employer] (G-codes done  visit 6)   Authorization Time Period 09/27/15 to 11/08/15   Authorization - Visit Number 9   Authorization - Number of Visits 16   PT Start Time 1120   PT Stop Time 1200   PT Time Calculation (min) 40 min   Equipment Utilized During Treatment Gait belt   Activity Tolerance Patient tolerated treatment well;Patient limited by fatigue   Behavior During Therapy [facility] for tasks assessed/performed      Past Medical History:  Diagnosis Date  . Anxiety   . Aortic stenosis   . COPD with chronic bronchitis ([place])   . Dementia   . Hyperlipidemia   . Hypertension   . Osteoporosis   . Renal insufficiency   . Rheumatic fever   . Sebaceous cyst   . Thyroid disease 2012   hyperthyroid, had abl;ation therapy treated by endo  . Urosepsis 2012   required several hospitalizations for this    Past Surgical History:  Procedure Laterality Date  . AORTIC VALVE REPLACEMENT     54m porcine 10/04  . APPENDECTOMY    . BREAST LUMPECTOMY    . CARDIAC VALVE REPLACEMENT    . CHOLECYSTECTOMY    . COLON SURGERY  2005  . COLONOSCOPY  07/23/2008    Single diminutive polyp in the rectum status post cold biopsy removal/ Status post right hemicolectomy/ Colonic ulcers in the upstream end the colon distal to the  anastomosis as described above.  Status post biopsy. The anastomosis itself and neoterminal ileum appeared normal  . ESOPHAGOGASTRODUODENOSCOPY  10/12/09   short segment Barrett's esophage/small hiatal  hernia,antral ersions  . LUMBAR LAMINECTOMY    . TONSILLECTOMY      There were no vitals filed for this visit.      Subjective Assessment - 10/29/15 1114    Subjective Pt stated she is feeling good today, no repots of pain today.  Reports she went to the senior center yesteday and really like the friendly people.  No reoprts of recent falls.     Patient Stated Goals reduce/prevent further falls, improve mobility   Currently in Pain? No/denies           [facility] - 10/29/15 0001      Ambulation/Gait   Gait Comments forwards/backwards gait approx 2x 339fwith min guard      Knee/Hip Exercises: Standing   Hip Extension Both;10 reps;Knee straight   Extension Limitations alternating LE   Gait Training 22663fith RW cueing for stride length and posture    Other Standing Knee Exercises shoulder extension alternating 10x             Balance Exercises - 10/29/15 1132      Balance Exercises: Standing   Rebounder Foam/compliant surface  catch throw with PT on airex 30 straight on, R/L angle 10x e   [employer] Rotation Foam/compliant surface;A/P;R/L  sitting on dynadisc   Other Standing Exercises NBOS on airex orange ball shoulder flexion 10  PT Short Term Goals - 10/18/15 1657      PT SHORT TERM GOAL #1   Title Patient to be able to ambulate at least 487f with rollator in order to demonstrate improved mobility and functional activity tolerance    Baseline 240 ft with rollator    Time 3   Period Weeks   Status Not Met     PT SHORT TERM GOAL #2   Title Patient's family to be able to accurately describe 5/5 ways to make household safer as well as safety tips in general to improve safety of general environmen tat home to reduce fall risk    Time 3   Period Weeks   Status Unable to assess     PT SHORT TERM GOAL #3   Title Patient to be able to complete TUG test in 15 seconds or less with rollator in order to demonstrate improved  functional balance    Baseline 29 sec, rollator    Time 3   Period Weeks   Status Not Met     PT SHORT TERM GOAL #4   Title Patient to be independent in HEP with family supervision, to be updated PRN    Time 3   Period Weeks   Status New           PT Long Term Goals - 10/18/15 1658      PT LONG TERM GOAL #1   Title Patient to demonstrate strength 5/5 in order to improve mobilty and reduce fall risk    Time 6   Period Weeks   Status Partially Met     PT LONG TERM GOAL #2   Title Patient to be able to maintain SLS at least 20 seconds each LE to demonstrate overall improved balance and reduced fall risk    Baseline RLE: 3 sec, LLE: 17 sec   Time 6   Period Weeks   Status Partially Met     PT LONG TERM GOAL #3   Title Patient/family to report no falls or LOB within the past 3 weeks in order to demonstrate improved function and safety at home    Time 6   Period Weeks   Status Unable to assess     PT LONG TERM GOAL #4   Title Patient to be able to safely ascend/descend at least 4 stairs with U railing, good eccentric control, and minimal unsteadiness in order to demonstrate improved safety in mobiltiy    Baseline B handrails, reciprocal pattern, increased time and CGA   Time 6   Period Weeks   Status Not Met               Plan - 10/29/15 1213    Clinical Impression Statement Pt pleasant and eager to participate with today's session.  No reports of recent falls.  Session incorporated gait and balance training on dynamic surfaces in seated on dynadisc and standing on airex with min guard required.  Incorporated catch and throw to improve balance reaction time on dynamic surface wtih min guard and ability to pick up ball off floor with safe mechanics.  BP at 148/63, SpO2 range from 93-97% and HR at 118.  EOS pt limited by fatigue, no reports of pain through session.   Rehab Potential Good   Clinical Impairments Affecting Rehab Potential dementia, CLD, rigid kyphosis,  monitor HR/SaO2   PT Frequency 2x / week   PT Duration 6 weeks   PT Treatment/Interventions ADLs/Self Care Home Management;Biofeedback;DME Instruction;Gait training;Stair  training;Functional mobility training;Therapeutic activities;Therapeutic exercise;Balance training;Neuromuscular re-education;Patient/family education;Manual techniques;Passive range of motion;Energy conservation;Taping   PT Next Visit Plan follow up on home O2 unit to session; Monitor O2/HR; progress LE strength, functional style therex; progress standing dynamic balance; consider some airex activity.       Patient will benefit from skilled therapeutic intervention in order to improve the following deficits and impairments:  Abnormal gait, Improper body mechanics, Decreased mobility, Postural dysfunction, Decreased activity tolerance, Decreased strength, Decreased balance, Difficulty walking, Decreased safety awareness, Impaired flexibility, Impaired vision/preception  Visit Diagnosis: History of falling  Unsteadiness on feet  Difficulty in walking, not elsewhere classified  Muscle weakness (generalized)     Problem List Patient Active Problem List   Diagnosis Date Noted  . Reduced vision 04/29/2015  . Special screening for malignant neoplasms, colon 01/09/2015  . Allergic rhinitis 12/29/2014  . GERD (gastroesophageal reflux disease) 10/18/2014  . Protein-calorie malnutrition, severe ([place]) 10/29/2013  . Cerebellar stroke ([place]) 10/29/2013  . Fall at home 10/28/2013  . Dementia with behavioral disturbance 09/08/2012  . Underweight 07/05/2011  . Weakness generalized 09/07/2010  . Lung nodule 02/16/2010  . CONSTIPATION 09/29/2009  . IMPAIRED FASTING GLUCOSE 07/12/2009  . Depression with anxiety 09/09/2008  . AORTIC VALVE DISEASE 08/10/2008  . Insomnia secondary to depression with anxiety 07/27/2008  . OTHER OSTEOPOROSIS 06/09/2008  . UNSPECIFIED DISORDER OF THYROID 06/20/2007  . Essential hypertension, benign  03/15/2007  . Diastolic CHF, chronic ([place]) 03/15/2007  . COPD GOLD III with reversibility  03/15/2007   Ihor Austin, LPTA; [place]  Aldona Lento 10/29/2015, 12:28 PM  [facility] [address] [place], [place], [address] Phone: [phone]   Fax:  [phone]  Name: MICHELLA DETJEN MRN: [id number] Date of Birth: [birth year]

## 2015-11-01 ENCOUNTER — Ambulatory Visit (HOSPITAL_COMMUNITY): Payer: Medicare Other | Admitting: Physical Therapy

## 2015-11-01 DIAGNOSIS — R2681 Unsteadiness on feet: Secondary | ICD-10-CM | POA: Diagnosis not present

## 2015-11-01 DIAGNOSIS — M6281 Muscle weakness (generalized): Secondary | ICD-10-CM | POA: Diagnosis not present

## 2015-11-01 DIAGNOSIS — R262 Difficulty in walking, not elsewhere classified: Secondary | ICD-10-CM | POA: Diagnosis not present

## 2015-11-01 DIAGNOSIS — Z9181 History of falling: Secondary | ICD-10-CM | POA: Diagnosis not present

## 2015-11-01 NOTE — Therapy (Signed)
[place], [place], [address] Phone: [phone]   Fax:  [phone]  Physical Therapy Treatment  Patient Details  Name: [name] MRN: [id number] Date of Birth: 05-23-1940 Referring Provider: Tula Nakayama  Encounter Date: 11/01/2015      PT End of Session - 11/01/15 1116    Visit Number 10   Number of Visits 12   Date for PT Re-Evaluation 11/08/15   Authorization Type Medicare and [employer] (G-codes done  visit 6)   Authorization Time Period 09/27/15 to 11/08/15   Authorization - Visit Number 10   Authorization - Number of Visits 16   PT Start Time 1194   PT Stop Time 1113   PT Time Calculation (min) 38 min   Equipment Utilized During Treatment Gait belt   Activity Tolerance Patient tolerated treatment well;Patient limited by fatigue   Behavior During Therapy [facility] for tasks assessed/performed      Past Medical History:  Diagnosis Date  . Anxiety   . Aortic stenosis   . COPD with chronic bronchitis ([place])   . Dementia   . Hyperlipidemia   . Hypertension   . Osteoporosis   . Renal insufficiency   . Rheumatic fever   . Sebaceous cyst   . Thyroid disease 2012   hyperthyroid, had abl;ation therapy treated by endo  . Urosepsis 2012   required several hospitalizations for this    Past Surgical History:  Procedure Laterality Date  . AORTIC VALVE REPLACEMENT     56m porcine 10/04  . APPENDECTOMY    . BREAST LUMPECTOMY    . CARDIAC VALVE REPLACEMENT    . CHOLECYSTECTOMY    . COLON SURGERY  2005  . COLONOSCOPY  07/23/2008    Single diminutive polyp in the rectum status post cold biopsy removal/ Status post right hemicolectomy/ Colonic ulcers in the upstream end the colon distal to the  anastomosis as described above.  Status post biopsy. The anastomosis itself and neoterminal ileum appeared normal  . ESOPHAGOGASTRODUODENOSCOPY  10/12/09   short segment Barrett's esophage/small hiatal  hernia,antral ersions  . LUMBAR LAMINECTOMY    . TONSILLECTOMY      There were no vitals filed for this visit.      Subjective Assessment - 11/01/15 1126    Subjective Pt states she is feeling light headed today.  Pt has not been to the senior center and did not do alot over the weekend.  No pain or falls reported.    Currently in Pain? No/denies                         [facility] - 11/01/15 0001      Knee/Hip Exercises: Standing   Heel Raises Both;10 reps   Heel Raises Limitations toeraises 10 reps   Hip ADduction Both;10 reps   Hip Extension Both;10 reps;Knee straight   Extension Limitations alternating LE             Balance Exercises - 11/01/15 1058      Balance Exercises: Standing   Tandem Gait Forward;1 rep   Retro Gait 1 rep   Sidestepping 1 rep             PT Short Term Goals - 10/18/15 1657      PT SHORT TERM GOAL #1   Title Patient to be able to ambulate at least 4587fwith rollator in order to demonstrate improved mobility and  functional activity tolerance    Baseline 240 ft with rollator    Time 3   Period Weeks   Status Not Met     PT SHORT TERM GOAL #2   Title Patient's family to be able to accurately describe 5/5 ways to make household safer as well as safety tips in general to improve safety of general environmen tat home to reduce fall risk    Time 3   Period Weeks   Status Unable to assess     PT SHORT TERM GOAL #3   Title Patient to be able to complete TUG test in 15 seconds or less with rollator in order to demonstrate improved functional balance    Baseline 29 sec, rollator    Time 3   Period Weeks   Status Not Met     PT SHORT TERM GOAL #4   Title Patient to be independent in HEP with family supervision, to be updated PRN    Time 3   Period Weeks   Status New           PT Long Term Goals - 10/18/15 1658      PT LONG TERM GOAL #1   Title Patient to demonstrate strength 5/5 in  order to improve mobilty and reduce fall risk    Time 6   Period Weeks   Status Partially Met     PT LONG TERM GOAL #2   Title Patient to be able to maintain SLS at least 20 seconds each LE to demonstrate overall improved balance and reduced fall risk    Baseline RLE: 3 sec, LLE: 17 sec   Time 6   Period Weeks   Status Partially Met     PT LONG TERM GOAL #3   Title Patient/family to report no falls or LOB within the past 3 weeks in order to demonstrate improved function and safety at home    Time 6   Period Weeks   Status Unable to assess     PT LONG TERM GOAL #4   Title Patient to be able to safely ascend/descend at least 4 stairs with U railing, good eccentric control, and minimal unsteadiness in order to demonstrate improved safety in mobiltiy    Baseline B handrails, reciprocal pattern, increased time and CGA   Time 6   Period Weeks   Status Not Met               Plan - 11/01/15 1116    Clinical Impression Statement Activity challenged by desaturation today.  Upon initial entrance, Sats were 64-72%.  Able to complete some actvity, however unable to get sats above 90% with rest.  Averaged between 72-88%.  HR/BP also monitored and remained within the normal range.  (BP 140/72, HR 74-104).  At one point during therapy, pateint reported "I see stars".  spoke to [facility] (daughter in law, Gerilyn Nestle) regarding need for portable O2 and response during treatment today.  She reported she would call MD regarding need for portable O2.   Rehab Potential Good   Clinical Impairments Affecting Rehab Potential dementia, CLD, rigid kyphosis, monitor HR/SaO2   PT Frequency 2x / week   PT Duration 6 weeks   PT Treatment/Interventions ADLs/Self Care Home Management;Biofeedback;DME Instruction;Gait training;Stair training;Functional mobility training;Therapeutic activities;Therapeutic exercise;Balance training;Neuromuscular re-education;Patient/family education;Manual techniques;Passive range of  motion;Energy conservation;Taping   PT Next Visit Plan follow up on portable O2 unit to session as unable to progress without continuous O2.  Monitor O2/HR; progress LE  strength, functional style therex; progress standing dynamic balance      Patient will benefit from skilled therapeutic intervention in order to improve the following deficits and impairments:  Abnormal gait, Improper body mechanics, Decreased mobility, Postural dysfunction, Decreased activity tolerance, Decreased strength, Decreased balance, Difficulty walking, Decreased safety awareness, Impaired flexibility, Impaired vision/preception  Visit Diagnosis: History of falling  Difficulty in walking, not elsewhere classified  Muscle weakness (generalized)  Unsteadiness on feet     Problem List Patient Active Problem List   Diagnosis Date Noted  . Reduced vision 04/29/2015  . Special screening for malignant neoplasms, colon 01/09/2015  . Allergic rhinitis 12/29/2014  . GERD (gastroesophageal reflux disease) 10/18/2014  . Protein-calorie malnutrition, severe ([place]) 10/29/2013  . Cerebellar stroke ([place]) 10/29/2013  . Fall at home 10/28/2013  . Dementia with behavioral disturbance 09/08/2012  . Underweight 07/05/2011  . Weakness generalized 09/07/2010  . Lung nodule 02/16/2010  . CONSTIPATION 09/29/2009  . IMPAIRED FASTING GLUCOSE 07/12/2009  . Depression with anxiety 09/09/2008  . AORTIC VALVE DISEASE 08/10/2008  . Insomnia secondary to depression with anxiety 07/27/2008  . OTHER OSTEOPOROSIS 06/09/2008  . UNSPECIFIED DISORDER OF THYROID 06/20/2007  . Essential hypertension, benign 03/15/2007  . Diastolic CHF, chronic ([place]) 03/15/2007  . COPD GOLD III with reversibility  03/15/2007    Teena Irani, PTA/CLT [phone]  11/01/2015, 11:27 AM  [facility] [address] [place], [place], [address] Phone: [phone]   Fax:  [phone]  Name: [name] MRN:  [id number] Date of Birth: Mar 31, 1940

## 2015-11-04 ENCOUNTER — Ambulatory Visit (HOSPITAL_COMMUNITY): Payer: Medicare Other

## 2015-11-04 DIAGNOSIS — R262 Difficulty in walking, not elsewhere classified: Secondary | ICD-10-CM

## 2015-11-04 DIAGNOSIS — M6281 Muscle weakness (generalized): Secondary | ICD-10-CM

## 2015-11-04 DIAGNOSIS — R2681 Unsteadiness on feet: Secondary | ICD-10-CM

## 2015-11-04 DIAGNOSIS — Z9181 History of falling: Secondary | ICD-10-CM | POA: Diagnosis not present

## 2015-11-04 NOTE — Therapy (Signed)
[name], [place], [address] Phone: [phone]   Fax:  [phone]  Physical Therapy Treatment  Patient Details  Name: [name] MRN: [id number] Date of Birth: October 17, 1940 Referring Provider: Tula Nakayama  Encounter Date: 11/04/2015      PT End of Session - 11/04/15 1402    Visit Number 11   Number of Visits 12   Date for PT Re-Evaluation 11/08/15   Authorization Type Medicare and [employer] (G-codes done  visit 6)   Authorization Time Period 09/27/15 to 11/08/15   Authorization - Visit Number 11   Authorization - Number of Visits 16   PT Start Time 3545   PT Stop Time 1435   PT Time Calculation (min) 42 min   Equipment Utilized During Treatment Gait belt   Activity Tolerance Patient tolerated treatment well;Patient limited by fatigue   Behavior During Therapy [facility] for tasks assessed/performed      Past Medical History:  Diagnosis Date  . Anxiety   . Aortic stenosis   . COPD with chronic bronchitis ([place])   . Dementia   . Hyperlipidemia   . Hypertension   . Osteoporosis   . Renal insufficiency   . Rheumatic fever   . Sebaceous cyst   . Thyroid disease 2012   hyperthyroid, had abl;ation therapy treated by endo  . Urosepsis 2012   required several hospitalizations for this    Past Surgical History:  Procedure Laterality Date  . AORTIC VALVE REPLACEMENT     37m porcine 10/04  . APPENDECTOMY    . BREAST LUMPECTOMY    . CARDIAC VALVE REPLACEMENT    . CHOLECYSTECTOMY    . COLON SURGERY  2005  . COLONOSCOPY  07/23/2008    Single diminutive polyp in the rectum status post cold biopsy removal/ Status post right hemicolectomy/ Colonic ulcers in the upstream end the colon distal to the  anastomosis as described above.  Status post biopsy. The anastomosis itself and neoterminal ileum appeared normal  . ESOPHAGOGASTRODUODENOSCOPY  10/12/09   short segment Barrett's esophage/small hiatal  hernia,antral ersions  . LUMBAR LAMINECTOMY    . TONSILLECTOMY      There were no vitals filed for this visit.      Subjective Assessment - 11/04/15 1359    Subjective Pt entered dept with O2 assistance today.  Reports she has not been to the senior center but is interested in attending.  No reports of pain of recent falls reported.   Patient Stated Goals reduce/prevent further falls, improve mobility   Currently in Pain? No/denies                         [facility] - [facility] 0001      Knee/Hip Exercises: Standing   Heel Raises Both;10 reps   Heel Raises Limitations toeraises 10 reps   Hip ADduction Both;10 reps   Hip Extension Both;10 reps;Knee straight   Extension Limitations alternating LE             Balance Exercises - 11/04/15 1501      Balance Exercises: Standing   Tandem Gait Forward;1 rep   Retro Gait 1 rep   Sidestepping 1 rep   Other Standing Exercises NBOS on airex orange ball shoulder flexion 10             PT Short Term Goals - 10/18/15 1657      PT SHORT TERM GOAL #  1   Title Patient to be able to ambulate at least 469f with rollator in order to demonstrate improved mobility and functional activity tolerance    Baseline 240 ft with rollator    Time 3   Period Weeks   Status Not Met     PT SHORT TERM GOAL #2   Title Patient's family to be able to accurately describe 5/5 ways to make household safer as well as safety tips in general to improve safety of general environmen tat home to reduce fall risk    Time 3   Period Weeks   Status Unable to assess     PT SHORT TERM GOAL #3   Title Patient to be able to complete TUG test in 15 seconds or less with rollator in order to demonstrate improved functional balance    Baseline 29 sec, rollator    Time 3   Period Weeks   Status Not Met     PT SHORT TERM GOAL #4   Title Patient to be independent in HEP with family supervision, to be updated PRN    Time  3   Period Weeks   Status New           PT Long Term Goals - 10/18/15 1658      PT LONG TERM GOAL #1   Title Patient to demonstrate strength 5/5 in order to improve mobilty and reduce fall risk    Time 6   Period Weeks   Status Partially Met     PT LONG TERM GOAL #2   Title Patient to be able to maintain SLS at least 20 seconds each LE to demonstrate overall improved balance and reduced fall risk    Baseline RLE: 3 sec, LLE: 17 sec   Time 6   Period Weeks   Status Partially Met     PT LONG TERM GOAL #3   Title Patient/family to report no falls or LOB within the past 3 weeks in order to demonstrate improved function and safety at home    Time 6   Period Weeks   Status Unable to assess     PT LONG TERM GOAL #4   Title Patient to be able to safely ascend/descend at least 4 stairs with U railing, good eccentric control, and minimal unsteadiness in order to demonstrate improved safety in mobiltiy    Baseline B handrails, reciprocal pattern, increased time and CGA   Time 6   Period Weeks   Status Not Met               Plan - 11/04/15 1501    Clinical Impression Statement Pt entered dept with portable O2 unit.  Vitals were assessed through session with Sat O2% range from 88-97%, initial BP at 158/78 mmHg and HR range monitored and remained within normal range.  Session focus on improving functional strengthening and balance training with multimodal cueing for proper form and technique with therex and min guard for safety with balance activities.  EOS pt limited by fatigue, no reports of pain.     Rehab Potential Good   Clinical Impairments Affecting Rehab Potential dementia, CLD, rigid kyphosis, monitor HR/SaO2   PT Frequency 2x / week   PT Duration 6 weeks   PT Treatment/Interventions ADLs/Self Care Home Management;Biofeedback;DME Instruction;Gait training;Stair training;Functional mobility training;Therapeutic activities;Therapeutic exercise;Balance  training;Neuromuscular re-education;Patient/family education;Manual techniques;Passive range of motion;Energy conservation;Taping   PT Next Visit Plan Reassess next session.  Continue to monitor O2/HR through session.  PT Home Exercise Plan No updates       Patient will benefit from skilled therapeutic intervention in order to improve the following deficits and impairments:  Abnormal gait, Improper body mechanics, Decreased mobility, Postural dysfunction, Decreased activity tolerance, Decreased strength, Decreased balance, Difficulty walking, Decreased safety awareness, Impaired flexibility, Impaired vision/preception  Visit Diagnosis: History of falling  Difficulty in walking, not elsewhere classified  Muscle weakness (generalized)  Unsteadiness on feet     Problem List Patient Active Problem List   Diagnosis Date Noted  . Reduced vision 04/29/2015  . Special screening for malignant neoplasms, colon 01/09/2015  . Allergic rhinitis 12/29/2014  . GERD (gastroesophageal reflux disease) 10/18/2014  . Protein-calorie malnutrition, severe ([place]) 10/29/2013  . Cerebellar stroke ([place]) 10/29/2013  . Fall at home 10/28/2013  . Dementia with behavioral disturbance 09/08/2012  . Underweight 07/05/2011  . Weakness generalized 09/07/2010  . Lung nodule 02/16/2010  . CONSTIPATION 09/29/2009  . IMPAIRED FASTING GLUCOSE 07/12/2009  . Depression with anxiety 09/09/2008  . AORTIC VALVE DISEASE 08/10/2008  . Insomnia secondary to depression with anxiety 07/27/2008  . OTHER OSTEOPOROSIS 06/09/2008  . UNSPECIFIED DISORDER OF THYROID 06/20/2007  . Essential hypertension, benign 03/15/2007  . Diastolic CHF, chronic ([place]) 03/15/2007  . COPD GOLD III with reversibility  03/15/2007   Ihor Austin, LPTA; [place]  Aldona Lento 11/04/2015, 3:06 PM  [place], [place], [address] Phone: [phone]   Fax:   [phone]  Name: PEARLA MCKINNY MRN: [id number] Date of Birth: 06-22-1940

## 2015-11-08 ENCOUNTER — Ambulatory Visit (INDEPENDENT_AMBULATORY_CARE_PROVIDER_SITE_OTHER): Payer: Medicare Other

## 2015-11-08 DIAGNOSIS — Z111 Encounter for screening for respiratory tuberculosis: Secondary | ICD-10-CM

## 2015-11-08 NOTE — Progress Notes (Signed)
PPD administered as a requirement for the [facility].  Patient will return on 9/20 for reading

## 2015-11-09 ENCOUNTER — Ambulatory Visit (HOSPITAL_COMMUNITY): Payer: Medicare Other | Admitting: Physical Therapy

## 2015-11-09 DIAGNOSIS — R262 Difficulty in walking, not elsewhere classified: Secondary | ICD-10-CM | POA: Diagnosis not present

## 2015-11-09 DIAGNOSIS — R2681 Unsteadiness on feet: Secondary | ICD-10-CM | POA: Diagnosis not present

## 2015-11-09 DIAGNOSIS — Z9181 History of falling: Secondary | ICD-10-CM

## 2015-11-09 DIAGNOSIS — M6281 Muscle weakness (generalized): Secondary | ICD-10-CM | POA: Diagnosis not present

## 2015-11-09 NOTE — Therapy (Signed)
[place], [place], [address] Phone: [phone]   Fax:  [phone]  Physical Therapy Treatment (Discharge)  Patient Details  Name: [name] MRN: [id number] Date of Birth: Jun 18, 1940 Referring Provider: Tula Nakayama   Encounter Date: 11/09/2015      PT End of Session - 11/09/15 1502    Visit Number 12   Number of Visits 12   Date for PT Re-Evaluation 11/08/15   Authorization Type Medicare and [employer] (G-codes done  visit 04/20/22)   Authorization Time Period 09/27/15 to 11/08/15   Authorization - Visit Number 12   Authorization - Number of Visits 16   PT Start Time 1302   PT Stop Time 1345   PT Time Calculation (min) 43 min   Activity Tolerance Patient tolerated treatment well;Patient limited by fatigue   Behavior During Therapy [facility] for tasks assessed/performed      Past Medical History:  Diagnosis Date  . Anxiety   . Aortic stenosis   . COPD with chronic bronchitis ([place])   . Dementia   . Hyperlipidemia   . Hypertension   . Osteoporosis   . Renal insufficiency   . Rheumatic fever   . Sebaceous cyst   . Thyroid disease 2012   hyperthyroid, had abl;ation therapy treated by endo  . Urosepsis 2012   required several hospitalizations for this    Past Surgical History:  Procedure Laterality Date  . AORTIC VALVE REPLACEMENT     30m porcine 10/04  . APPENDECTOMY    . BREAST LUMPECTOMY    . CARDIAC VALVE REPLACEMENT    . CHOLECYSTECTOMY    . COLON SURGERY  2005  . COLONOSCOPY  07/23/2008    Single diminutive polyp in the rectum status post cold biopsy removal/ Status post right hemicolectomy/ Colonic ulcers in the upstream end the colon distal to the  anastomosis as described above.  Status post biopsy. The anastomosis itself and neoterminal ileum appeared normal  . ESOPHAGOGASTRODUODENOSCOPY  10/12/09   short segment Barrett's esophage/small hiatal hernia,antral ersions  . LUMBAR  LAMINECTOMY    . TONSILLECTOMY      There were no vitals filed for this visit.      Subjective Assessment - 11/09/15 1307    Subjective Patient arrives today on O2 assistance, feeling well today. Reports she has been to the senior center and liked it. No falls recently. She feels fairly confident in her ability to move around safely.    Patient is accompained by: Family member   Patient Stated Goals reduce/prevent further falls, improve mobility   Currently in Pain? No/denies            [facility]      Assessment   Medical Diagnosis fall at home    Referring Provider MTula Nakayama   Next MD Visit October      Balance Screen   Has the patient fallen in the past 6 months Yes   How many times? 1   Has the patient had a decrease in activity level because of a fear of falling?  No   Is the patient reluctant to leave their home because of a fear of falling?  No     Prior Function   Level of Independence Needs assistance with ADLs;Needs assistance with homemaking;Independent with gait;Independent with transfers   Vocation Retired     Strength   Right Hip Flexion 4+/5   Right Hip ABduction 3+/5  Left Hip Flexion 4+/5   Left Hip ABduction 4+/5   Right Knee Flexion 3+/5   Right Knee Extension 5/5   Left Knee Flexion 3/5   Left Knee Extension 5/5   Right Ankle Dorsiflexion 5/5   Left Ankle Dorsiflexion 5/5     6 minute walk test results    Aerobic Endurance Distance Walked 386   Endurance additional comments 3MWT      High Level Balance   High Level Balance Comments attempted TUG, unable to complete due to command following deficit   SLS 3 seconds R, 6 seconds L                              PT Education - 11/09/15 1502    Education provided Yes   Education Details DC today; education regarding regular activity including walking, regular participation at senior center, no changes to [employer]) Educated Patient;  caregiver    Methods Explanation;Handout   Comprehension Verbalized understanding          PT Short Term Goals - 11/09/15 1331      PT SHORT TERM GOAL #1   Title Patient to be able to ambulate at least 471f with rollator in order to demonstrate improved mobility and functional activity tolerance    Baseline 9/19- 3871fwith rollator and O2, SOB/DOE    Time 3   Period Weeks   Status Not Met     PT SHORT TERM GOAL #2   Title Patient's family to be able to accurately describe 5/5 ways to make household safer as well as safety tips in general to improve safety of general environmen tat home to reduce fall risk    Baseline 9/19- family not present    Time 3   Period Weeks   Status Unable to assess     PT SHORT TERM GOAL #3   Title Patient to be able to complete TUG test in 15 seconds or less with rollator in order to demonstrate improved functional balance    Baseline 9/19- unable to assess due to command following    Time 3   Period Weeks   Status Not Met     PT SHORT TERM GOAL #4   Title Patient to be independent in HEP with family supervision, to be updated PRN    Baseline 9/19 mixed compliance, reports she likes the senior center    Time 3   Period Weeks   Status On-going           PT Long Term Goals - 11/09/15 1334      PT LONG TERM GOAL #1   Title Patient to demonstrate strength 5/5 in order to improve mobilty and reduce fall risk    Time 6   Period Weeks   Status Partially Met     PT LONG TERM GOAL #2   Title Patient to be able to maintain SLS at least 20 seconds each LE to demonstrate overall improved balance and reduced fall risk    Baseline 9/19- 6 second max    Time 6   Period Weeks   Status On-going     PT LONG TERM GOAL #3   Title Patient/family to report no falls or LOB within the past 3 weeks in order to demonstrate improved function and safety at home    Baseline 9/19- patient reports no falls, but does have dementia    Time 6  Period Weeks    Status Unable to assess     PT LONG TERM GOAL #4   Title Patient to be able to safely ascend/descend at least 4 stairs with U railing, good eccentric control, and minimal unsteadiness in order to demonstrate improved safety in mobiltiy    Baseline 11-22-22- DNT    Period Weeks   Status Not Met               Plan - 11-22-15 1503    Clinical Impression Statement Re-assessment performed today. Patient does appear to be making some objective progress, however does appear to be limited by inherit confusion related to pre-existing dementia and does demonstrate some difficulty with command following, but in general is pleasant. Patient able to mobilize and transfer without rolling walker, and now that she has started on portable O2 her vitals appear to be improving in general, although she does need some cues for pursed lip breathing occasionally. At this point patient appears generally ready for DC; spoke to family, and recommended ongoing regular physical activity, regular participation at senior center, and compliance with current HEP (not updated due to concerns with compliance). DC today with recommended transition to senior center activities.    Rehab Potential Good   Clinical Impairments Affecting Rehab Potential dementia, CLD, rigid kyphosis, monitor HR/SaO2   PT Next Visit Plan DC today    PT Home Exercise Plan No updates    Consulted and Agree with Plan of Care Patient      Patient will benefit from skilled therapeutic intervention in order to improve the following deficits and impairments:  Abnormal gait, Improper body mechanics, Decreased mobility, Postural dysfunction, Decreased activity tolerance, Decreased strength, Decreased balance, Difficulty walking, Decreased safety awareness, Impaired flexibility, Impaired vision/preception  Visit Diagnosis: History of falling  Difficulty in walking, not elsewhere classified  Muscle weakness (generalized)  Unsteadiness on feet        G-Codes - 2015-11-22 1509    Functional Assessment Tool Used Based on skilled clinical assessment of strength, gait, balace, safety awareness    Functional Limitation Mobility: Walking and moving around   Mobility: Walking and Moving Around Goal Status [phone]) At least 20 percent but less than 40 percent impaired, limited or restricted   Mobility: Walking and Moving Around Discharge Status [phone]) At least 20 percent but less than 40 percent impaired, limited or restricted      Problem List Patient Active Problem List   Diagnosis Date Noted  . Reduced vision 04/29/2015  . Special screening for malignant neoplasms, colon 01/09/2015  . Allergic rhinitis 12/29/2014  . GERD (gastroesophageal reflux disease) 10/18/2014  . Protein-calorie malnutrition, severe ([place]) 10/29/2013  . Cerebellar stroke ([place]) 10/29/2013  . Fall at home 10/28/2013  . Dementia with behavioral disturbance 09/08/2012  . Underweight 07/05/2011  . Weakness generalized 09/07/2010  . Lung nodule 02/16/2010  . CONSTIPATION 09/29/2009  . IMPAIRED FASTING GLUCOSE 07/12/2009  . Depression with anxiety 09/09/2008  . AORTIC VALVE DISEASE 08/10/2008  . Insomnia secondary to depression with anxiety 07/27/2008  . OTHER OSTEOPOROSIS 06/09/2008  . UNSPECIFIED DISORDER OF THYROID 06/20/2007  . Essential hypertension, benign 03/15/2007  . Diastolic CHF, chronic ([place]) 03/15/2007  . COPD GOLD III with reversibility  03/15/2007    PHYSICAL THERAPY DISCHARGE SUMMARY  Visits from Start of Care: 12  Current functional level related to goals / functional outcomes: Patient generally appears ready for DC; safety awareness and command following generally appear limited by inherent confusion due to dementia. Recommend  DC with transition to regular activity/participation at senior center.    Remaining deficits: Postural deficits, functional weakness, reduced functional activity tolerance, unsteadiness    Education / Equipment: DC  today; education regarding regular activity including walking, regular participation at senior center, no changes to HEP    Plan: Patient agrees to discharge.  Patient goals were not met. Patient is being discharged due to being pleased with the current functional level.  ?????      Deniece Ree PT, DPT [facility] [address] [place], [place], [address] Phone: [phone]   Fax:  [phone]  Name: ISSABELLA RIX MRN: [id number] Date of Birth: 07/13/1940

## 2015-11-10 ENCOUNTER — Ambulatory Visit: Payer: Medicare Other

## 2015-11-10 LAB — TB SKIN TEST
INDURATION: 0 mm
TB SKIN TEST: NEGATIVE

## 2015-11-14 ENCOUNTER — Other Ambulatory Visit: Payer: Self-pay | Admitting: Family Medicine

## 2015-11-16 IMAGING — CR DG CHEST 1V PORT
1 series · 1 of 1 positions shown · non-contrast
Comparison: 07/29/2013

CLINICAL DATA: Weakness, shortness of breath.

EXAM:
PORTABLE CHEST - 1 VIEW

[portable]
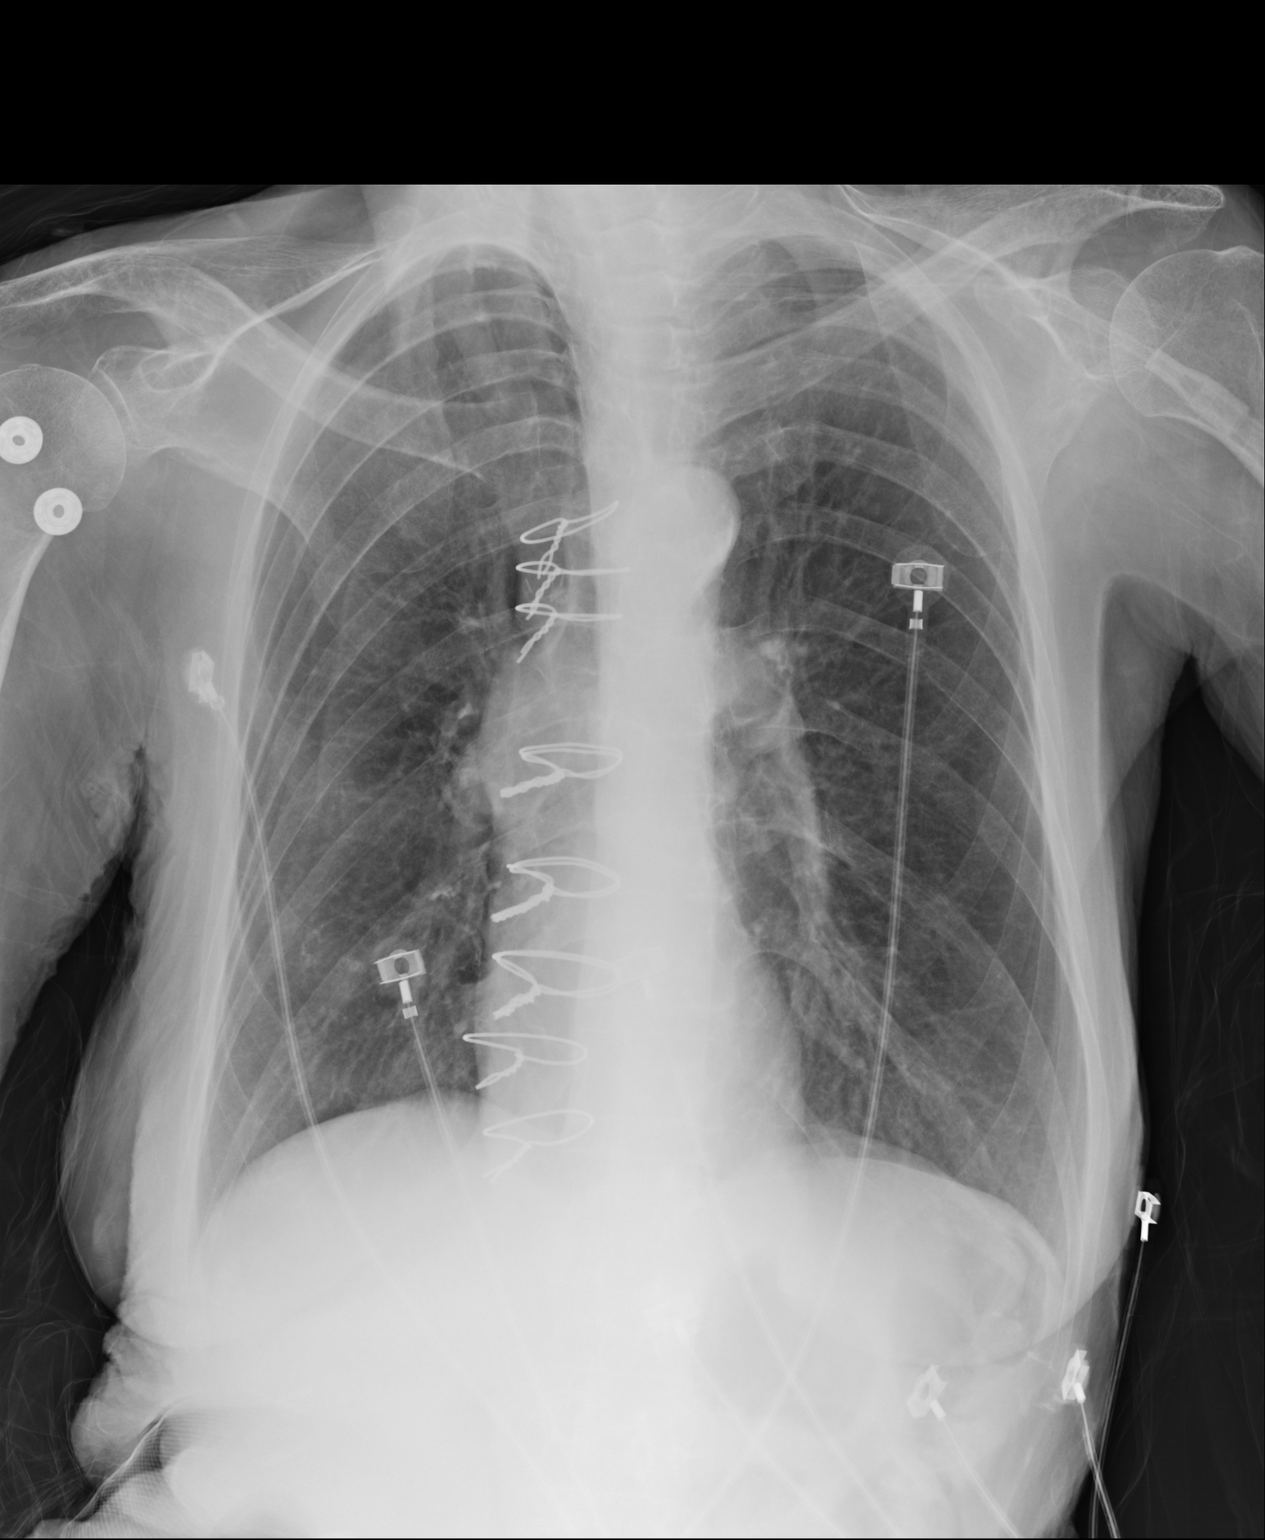

[1 of 1 positions shown; findings below may reference images not displayed]

FINDINGS: Prior CABG. Stable hyperinflation of the lungs compatible with COPD.
Heart is normal size. Lungs are clear. No effusions. No acute bony
abnormality.
IMPRESSION: Stable hyperinflation/COPD.  No active disease.

## 2015-11-28 ENCOUNTER — Other Ambulatory Visit: Payer: Self-pay | Admitting: Family Medicine

## 2015-11-30 ENCOUNTER — Other Ambulatory Visit: Payer: Self-pay | Admitting: Family Medicine

## 2015-12-08 ENCOUNTER — Other Ambulatory Visit: Payer: Self-pay | Admitting: Family Medicine

## 2015-12-24 ENCOUNTER — Other Ambulatory Visit: Payer: Self-pay | Admitting: Family Medicine

## 2016-01-30 ENCOUNTER — Other Ambulatory Visit: Payer: Self-pay | Admitting: Family Medicine

## 2016-01-31 ENCOUNTER — Other Ambulatory Visit: Payer: Self-pay | Admitting: Family Medicine

## 2016-02-03 ENCOUNTER — Other Ambulatory Visit: Payer: Self-pay | Admitting: Family Medicine

## 2016-02-06 ENCOUNTER — Other Ambulatory Visit: Payer: Self-pay | Admitting: Family Medicine

## 2016-03-01 ENCOUNTER — Other Ambulatory Visit: Payer: Self-pay | Admitting: Family Medicine

## 2016-04-06 ENCOUNTER — Other Ambulatory Visit: Payer: Self-pay | Admitting: Family Medicine

## 2016-04-14 ENCOUNTER — Other Ambulatory Visit: Payer: Self-pay

## 2016-04-14 ENCOUNTER — Telehealth: Payer: Self-pay | Admitting: Family Medicine

## 2016-04-14 ENCOUNTER — Other Ambulatory Visit: Payer: Self-pay | Admitting: Family Medicine

## 2016-04-14 MED ORDER — ALBUTEROL SULFATE HFA 108 (90 BASE) MCG/ACT IN AERS: 2.0000 | INHALATION_SPRAY | Freq: Four times a day (QID) | RESPIRATORY_TRACT | 0 refills | Status: DC | PRN

## 2016-04-14 NOTE — Telephone Encounter (Signed)
pls let pt know that I ma sending in proventil inhaler for use in case she is wheezing ands unable to get to her neb machine as in away from home. Let her know [facility] contacted me about this, and tho I know she seldom leaves home, hopefully going to [employer] now, she will have this available. I am entering the med.ay have to find pout which albuterol MDI is covered, pls send in after you speak with her , and re fax messaging to [facility], thanks

## 2016-04-21 NOTE — Telephone Encounter (Signed)
They are aware

## 2016-05-03 ENCOUNTER — Other Ambulatory Visit: Payer: Self-pay | Admitting: Family Medicine

## 2016-05-15 ENCOUNTER — Other Ambulatory Visit: Payer: Self-pay | Admitting: Family Medicine

## 2016-05-17 ENCOUNTER — Ambulatory Visit (INDEPENDENT_AMBULATORY_CARE_PROVIDER_SITE_OTHER): Payer: Medicare Other

## 2016-05-17 VITALS — BP 110/60 | HR 76 | Temp 98.8°F | Ht 62.0 in | Wt 91.0 lb

## 2016-05-17 DIAGNOSIS — Z0000 Encounter for general adult medical examination without abnormal findings: Secondary | ICD-10-CM | POA: Diagnosis not present

## 2016-05-17 NOTE — Patient Instructions (Addendum)
[name] , Thank you for taking time to come for your Medicare Wellness Visit. I appreciate your ongoing commitment to your health goals. Please review the following plan we discussed and let me know if I can assist you in the future.   Screening recommendations/referrals: Colonoscopy: Up to date, and is no longer required Mammogram: Overdue, declined Bone Density: Up to date Recommended yearly ophthalmology/optometry visit for glaucoma screening and checkup Recommended yearly dental visit for hygiene and checkup  Vaccinations: Influenza vaccine: Due now, declined Pneumococcal vaccine: Up to date Tdap vaccine: Up to date, next due 10/2019 Shingles vaccine: Due now, patient declined     Advanced directives: Advance directive discussed with you today. I have provided a copy for you to complete at home and have notarized. Once this is complete please bring a copy in to our office so we can scan it into your chart.  Conditions/risks identified: Underweight recommend adding boost or ensure 1 can a day.  Next appointment: Follow up in 1 week with Dr. Moshe Cipro. Follow up in 1 year for your annual wellness visit.    Preventive Care 75 Years and Older, Female Preventive care refers to lifestyle choices and visits with your health care provider that can promote health and wellness. What does preventive care include?  A yearly physical exam. This is also called an annual well check.  Dental exams once or twice a year.  Routine eye exams. Ask your health care provider how often you should have your eyes checked.  Personal lifestyle choices, including:  Daily care of your teeth and gums.  Regular physical activity.  Eating a healthy diet.  Avoiding tobacco and drug use.  Limiting alcohol use.  Practicing safe sex.  Taking low-dose aspirin every day.  Taking vitamin and mineral supplements as recommended by your health care provider. What happens during an annual well check? The  services and screenings done by your health care provider during your annual well check will depend on your age, overall health, lifestyle risk factors, and family history of disease. Counseling  Your health care provider may ask you questions about your:  Alcohol use.  Tobacco use.  Drug use.  Emotional well-being.  Home and relationship well-being.  Sexual activity.  Eating habits.  History of falls.  Memory and ability to understand (cognition).  Work and work Statistician.  Reproductive health. Screening  You may have the following tests or measurements:  Height, weight, and BMI.  Blood pressure.  Lipid and cholesterol levels. These may be checked every 5 years, or more frequently if you are over 75 years old.  Skin check.  Lung cancer screening. You may have this screening every year starting at age 75 if you have a 30-pack-year history of smoking and currently smoke or have quit within the past 15 years.  Fecal occult blood test (FOBT) of the stool. You may have this test every year starting at age 75.  Flexible sigmoidoscopy or colonoscopy. You may have a sigmoidoscopy every 5 years or a colonoscopy every 10 years starting at age 75.  Hepatitis C blood test.  Hepatitis B blood test.  Sexually transmitted disease (STD) testing.  Diabetes screening. This is done by checking your blood sugar (glucose) after you have not eaten for a while (fasting). You may have this done every 1-3 years.  Bone density scan. This is done to screen for osteoporosis. You may have this done starting at age 75.  Mammogram. This may be done every 1-2 years.  Talk to your health care provider about how often you should have regular mammograms. Talk with your health care provider about your test results, treatment options, and if necessary, the need for more tests. Vaccines  Your health care provider may recommend certain vaccines, such as:  Influenza vaccine. This is recommended  every year.  Tetanus, diphtheria, and acellular pertussis (Tdap, Td) vaccine. You may need a Td booster every 10 years.  Zoster vaccine. You may need this after age 75.  Pneumococcal 13-valent conjugate (PCV13) vaccine. One dose is recommended after age 75.  Pneumococcal polysaccharide (PPSV23) vaccine. One dose is recommended after age 75. Talk to your health care provider about which screenings and vaccines you need and how often you need them. This information is not intended to replace advice given to you by your health care provider. Make sure you discuss any questions you have with your health care provider. Document Released: 03/05/2015 Document Revised: 10/27/2015 Document Reviewed: 12/08/2014 Elsevier Interactive Patient Education  2017 [place] Prevention in the Home Falls can cause injuries. They can happen to people of all ages. There are many things you can do to make your home safe and to help prevent falls. What can I do on the outside of my home?  Regularly fix the edges of walkways and driveways and fix any cracks.  Remove anything that might make you trip as you walk through a door, such as a raised step or threshold.  Trim any bushes or trees on the path to your home.  Use bright outdoor lighting.  Clear any walking paths of anything that might make someone trip, such as rocks or tools.  Regularly check to see if handrails are loose or broken. Make sure that both sides of any steps have handrails.  Any raised decks and porches should have guardrails on the edges.  Have any leaves, snow, or ice cleared regularly.  Use sand or salt on walking paths during winter.  Clean up any spills in your garage right away. This includes oil or grease spills. What can I do in the bathroom?  Use night lights.  Install grab bars by the toilet and in the tub and shower. Do not use towel bars as grab bars.  Use non-skid mats or decals in the tub or shower.  If  you need to sit down in the shower, use a plastic, non-slip stool.  Keep the floor dry. Clean up any water that spills on the floor as soon as it happens.  Remove soap buildup in the tub or shower regularly.  Attach bath mats securely with double-sided non-slip rug tape.  Do not have throw rugs and other things on the floor that can make you trip. What can I do in the bedroom?  Use night lights.  Make sure that you have a light by your bed that is easy to reach.  Do not use any sheets or blankets that are too big for your bed. They should not hang down onto the floor.  Have a firm chair that has side arms. You can use this for support while you get dressed.  Do not have throw rugs and other things on the floor that can make you trip. What can I do in the kitchen?  Clean up any spills right away.  Avoid walking on wet floors.  Keep items that you use a lot in easy-to-reach places.  If you need to reach something above you, use a strong step stool  that has a grab bar.  Keep electrical cords out of the way.  Do not use floor polish or wax that makes floors slippery. If you must use wax, use non-skid floor wax.  Do not have throw rugs and other things on the floor that can make you trip. What can I do with my stairs?  Do not leave any items on the stairs.  Make sure that there are handrails on both sides of the stairs and use them. Fix handrails that are broken or loose. Make sure that handrails are as long as the stairways.  Check any carpeting to make sure that it is firmly attached to the stairs. Fix any carpet that is loose or worn.  Avoid having throw rugs at the top or bottom of the stairs. If you do have throw rugs, attach them to the floor with carpet tape.  Make sure that you have a light switch at the top of the stairs and the bottom of the stairs. If you do not have them, ask someone to add them for you. What else can I do to help prevent falls?  Wear shoes  that:  Do not have high heels.  Have rubber bottoms.  Are comfortable and fit you well.  Are closed at the toe. Do not wear sandals.  If you use a stepladder:  Make sure that it is fully opened. Do not climb a closed stepladder.  Make sure that both sides of the stepladder are locked into place.  Ask someone to hold it for you, if possible.  Clearly mark and make sure that you can see:  Any grab bars or handrails.  First and last steps.  Where the edge of each step is.  Use tools that help you move around (mobility aids) if they are needed. These include:  Canes.  Walkers.  Scooters.  Crutches.  Turn on the lights when you go into a dark area. Replace any light bulbs as soon as they burn out.  Set up your furniture so you have a clear path. Avoid moving your furniture around.  If any of your floors are uneven, fix them.  If there are any pets around you, be aware of where they are.  Review your medicines with your doctor. Some medicines can make you feel dizzy. This can increase your chance of falling. Ask your doctor what other things that you can do to help prevent falls. This information is not intended to replace advice given to you by your health care provider. Make sure you discuss any questions you have with your health care provider. Document Released: 12/03/2008 Document Revised: 07/15/2015 Document Reviewed: 03/13/2014 Elsevier Interactive Patient Education  2017 [employer].

## 2016-05-17 NOTE — Progress Notes (Signed)
Subjective:   [name] is a 76 y.o. female who presents for Medicare Annual (Subsequent) preventive examination.  Review of Systems: N/A Cardiac Risk Factors include: advanced age (>5men, >78 women);hypertension     Objective:     Vitals: BP 110/60   Pulse 76   Temp 98.8 F (37.1 C) (Oral)   Ht 5\' 2"  (1.575 m)   Wt 91 lb 0.6 oz (41.3 kg)   SpO2 91%   BMI 16.65 kg/m   Body mass index is 16.65 kg/m.   Tobacco History  Smoking Status  . Former Smoker  . Packs/day: 0.25  . Years: 50.00  . Types: Cigarettes  . Quit date: 04/30/2012  Smokeless Tobacco  . Never Used     Counseling given: Not Answered   Past Medical History:  Diagnosis Date  . Anxiety   . Aortic stenosis   . COPD with chronic bronchitis ([place])   . Dementia   . Hyperlipidemia   . Hypertension   . Osteoporosis   . Renal insufficiency   . Rheumatic fever   . Sebaceous cyst   . Thyroid disease 2012   hyperthyroid, had abl;ation therapy treated by endo  . Urosepsis 2012   required several hospitalizations for this   Past Surgical History:  Procedure Laterality Date  . AORTIC VALVE REPLACEMENT     73mm porcine 10/04  . APPENDECTOMY    . BREAST LUMPECTOMY    . CARDIAC VALVE REPLACEMENT    . CHOLECYSTECTOMY    . COLON SURGERY  2005  . COLONOSCOPY  07/23/2008    Single diminutive polyp in the rectum status post cold biopsy removal/ Status post right hemicolectomy/ Colonic ulcers in the upstream end the colon distal to the  anastomosis as described above.  Status post biopsy. The anastomosis itself and neoterminal ileum appeared normal  . ESOPHAGOGASTRODUODENOSCOPY  10/12/09   short segment Barrett's esophage/small hiatal hernia,antral ersions  . LUMBAR LAMINECTOMY    . TONSILLECTOMY     Family History  Problem Relation Age of Onset  . Heart attack Mother 31  . Stroke Father 29  . Hypertension Sister   . Depression Sister   . Alcohol abuse Sister   . Diabetes Son    History  Sexual  Activity  . Sexual activity: Not Currently    Outpatient Encounter Prescriptions as of 05/17/2016  Medication Sig  . albuterol (PROVENTIL) (2.5 MG/3ML) 0.083% nebulizer solution TAKE 3 MLS (2.5 MG TOTAL) BY NEBULIZATION 3 (THREE) TIMES DAILY. (Patient taking differently: TAKE 3 MLS (2.5 MG TOTAL) BY NEBULIZATION 3 (THREE) TIMES DAILY PRN)  . ALPRAZolam (XANAX) 0.25 MG tablet TAKE 1 TABLET BY MOUTH AT BEDTIME AS NEEDED (Patient taking differently: TAKE 1 TABLET BY MOUTH AT BEDTIME)  . aspirin 81 MG tablet Take 1 tablet (81 mg total) by mouth daily.  . Calcium Carbonate-Vit D-Min (QC CALCIUM/MINERALS/VITAMIN D) 600-400 MG-UNIT TABS Take 1 tablet by mouth every evening.   . Choline Fenofibrate (FENOFIBRIC ACID) 135 MG CPDR TAKE 1 CAPSULE (135 MG TOTAL) BY MOUTH AT BEDTIME.  . clindamycin (CLEOCIN) 300 MG capsule TAKE 2 CAPSULES BY MOUTH 1 HOUR PRIOR TO APPOINTMENT  . donepezil (ARICEPT) 10 MG tablet TAKE 1 TABLET (10 MG TOTAL) BY MOUTH AT BEDTIME.  . famotidine (PEPCID) 20 MG tablet Take 20 mg by mouth at bedtime.  . Ferrous Fumarate (IRON) 18 MG TBCR Take 1 tablet by mouth every other day.  . folic acid (FOLVITE) 1 MG tablet Take 1 tablet (1  mg total) by mouth daily.  Marland Kitchen ibuprofen (ADVIL,MOTRIN) 200 MG tablet Per bottle as needed  . lansoprazole (PREVACID) 15 MG capsule Take 15 mg by mouth daily at 12 noon.  . loratadine (CLARITIN) 10 MG tablet Take 10 mg by mouth daily.   Marland Kitchen losartan (COZAAR) 25 MG tablet TAKE 1 TABLET EVERY DAY AS NEEDED (Patient taking differently: TAKE 1 TABLET EVERY DAY)  . mirtazapine (REMERON) 7.5 MG tablet TAKE 1 TABLET BY MOUTH AT BEDTIME  . Multiple Vitamins-Minerals (MULTIVITAMINS THER. W/MINERALS) TABS tablet Take 1 tablet by mouth daily.  . Probiotic Product (PROBIOTIC DAILY PO) Take 1 tablet by mouth daily.   . sertraline (ZOLOFT) 50 MG tablet TAKE 1 TABLET BY MOUTH EVERY DAY  . SPIRIVA HANDIHALER 18 MCG inhalation capsule INHALE 1 CAPSULE WITH HANDIHALER AT THE SAME  TIME EVERY DAY  . Vitamin D, Ergocalciferol, (DRISDOL) 50000 units CAPS capsule TAKE 1 CAPSULE (50,000 UNITS TOTAL) BY MOUTH EVERY 7 (SEVEN) DAYS.  . [DISCONTINUED] albuterol (PROVENTIL HFA;VENTOLIN HFA) 108 (90 Base) MCG/ACT inhaler Inhale 2 puffs into the lungs every 6 (six) hours as needed for wheezing or shortness of breath.  . [DISCONTINUED] albuterol (PROVENTIL HFA;VENTOLIN HFA) 108 (90 Base) MCG/ACT inhaler Inhale 2 puffs into the lungs every 6 (six) hours as needed for wheezing or shortness of breath.  . [DISCONTINUED] azelastine (ASTELIN) 0.1 % nasal spray Place 2 sprays into both nostrils 2 (two) times daily. Use in each nostril as directed  . [DISCONTINUED] sertraline (ZOLOFT) 50 MG tablet TAKE 1 TABLET (50 MG TOTAL) BY MOUTH DAILY. (INS. REQUIRES 90 DAY SUPPLY... NOT ON SCRIPT SYNC)   No facility-administered encounter medications on file as of 05/17/2016.     Activities of Daily Living In your present state of health, do you have any difficulty performing the following activities: 05/17/2016  Hearing? N  Vision? N  Difficulty concentrating or making decisions? Y  Walking or climbing stairs? Y  Dressing or bathing? Y  Doing errands, shopping? Y  Preparing Food and eating ? Y  Using the Toilet? N  In the past six months, have you accidently leaked urine? Y  Do you have problems with loss of bowel control? N  Managing your Medications? Y  Managing your Finances? Y  Housekeeping or managing your Housekeeping? Y  Some recent data might be hidden    Patient Care Team: Fayrene Helper, MD as PCP - General Collene Gobble, MD (Pulmonary Disease) Fran Lowes, MD (Nephrology) Cassandria Anger, MD (Endocrinology) Tanda Rockers, MD as Consulting Physician (Pulmonary Disease)    Assessment:    Exercise Activities and Dietary recommendations Current Exercise Habits: Home exercise routine, Type of exercise: walking, Time (Minutes): 10, Frequency (Times/Week): 5, Weekly  Exercise (Minutes/Week): 50, Intensity: Mild  Goals    None     Fall Risk Fall Risk  05/17/2016 09/02/2015 04/28/2015 08/27/2014 09/04/2012  Falls in the past year? No Yes No No Yes  Number falls in past yr: - 1 - - 2 or more  Risk for fall due to : Impaired balance/gait;Impaired mobility - - - History of fall(s);Impaired balance/gait;Impaired mobility   Depression Screen PHQ 2/9 Scores 05/17/2016 10/28/2015 10/28/2015 08/27/2014  PHQ - 2 Score 0 0 3 0  PHQ- 9 Score - 3 10 -     Cognitive Function: Consistent with Dementia diagnosis MMSE - Mini Mental State Exam 05/17/2016  Orientation to time 0  Orientation to Place 0  Registration 3  Attention/ Calculation 3  Recall  0  Language- name 2 objects 2  Language- repeat 1  Language- follow 3 step command 3  Language- read & follow direction 1  Write a sentence 1  Copy design 0  Total score 14        Immunization History  Administered Date(s) Administered  . Influenza Split 11/22/2011  . Influenza Whole 11/12/2006, 11/01/2009, 10/27/2010  . Influenza,inj,Quad PF,36+ Mos 11/12/2012, 12/25/2013  . PPD Test 11/08/2015  . Pneumococcal Conjugate-13 03/30/2014  . Pneumococcal Polysaccharide-23 11/20/2006, 12/30/2008  . Td 11/01/2009   Screening Tests Health Maintenance  Topic Date Due  . INFLUENZA VACCINE  05/11/2017 (Originally 09/21/2015)  . MAMMOGRAM  09/01/2016  . TETANUS/TDAP  11/02/2019  . COLONOSCOPY  02/19/2020  . DEXA SCAN  Completed  . PNA vac Low Risk Adult  Completed      Plan:  I have personally reviewed and addressed the Medicare Annual Wellness questionnaire and have noted the following in the patient's chart:  A. Medical and social history B. Use of alcohol, tobacco or illicit drugs  C. Current medications and supplements D. Functional ability and status E.  Nutritional status F.  Physical activity G. Advance directives H. List of other physicians I.  Hospitalizations, surgeries, and ER visits in previous 12  months J.  Page to include cognitive, depression, and falls L. Referrals and appointments - none  In addition, I have reviewed and discussed with patient certain preventive protocols, quality metrics, and best practice recommendations. A written personalized care plan for preventive services as well as general preventive health recommendations were provided to patient.  Signed,   Stormy Fabian, LPN Lead Nurse Health Advisor

## 2016-05-22 ENCOUNTER — Telehealth: Payer: Self-pay | Admitting: Family Medicine

## 2016-05-22 NOTE — Telephone Encounter (Signed)
pls  Schedule a visit with me this week, she needs a visit with mE to requalify for oxygen per guidelines, I a cannot siign her form with me seeing her

## 2016-05-23 ENCOUNTER — Other Ambulatory Visit: Payer: Self-pay | Admitting: Family Medicine

## 2016-05-24 ENCOUNTER — Other Ambulatory Visit: Payer: Self-pay

## 2016-05-24 ENCOUNTER — Emergency Department (HOSPITAL_COMMUNITY)
Admission: EM | Admit: 2016-05-24 | Discharge: 2016-05-24 | Disposition: A | Discharge status: Home / Self Care | Payer: Medicare Other | Attending: Emergency Medicine | Admitting: Emergency Medicine

## 2016-05-24 ENCOUNTER — Encounter (HOSPITAL_COMMUNITY): Payer: Self-pay | Admitting: Cardiology

## 2016-05-24 DIAGNOSIS — I110 Hypertensive heart disease with heart failure: Secondary | ICD-10-CM | POA: Diagnosis not present

## 2016-05-24 DIAGNOSIS — Y999 Unspecified external cause status: Secondary | ICD-10-CM | POA: Diagnosis not present

## 2016-05-24 DIAGNOSIS — I5032 Chronic diastolic (congestive) heart failure: Secondary | ICD-10-CM | POA: Insufficient documentation

## 2016-05-24 DIAGNOSIS — S41112A Laceration without foreign body of left upper arm, initial encounter: Secondary | ICD-10-CM | POA: Diagnosis not present

## 2016-05-24 DIAGNOSIS — J449 Chronic obstructive pulmonary disease, unspecified: Secondary | ICD-10-CM | POA: Insufficient documentation

## 2016-05-24 DIAGNOSIS — Y929 Unspecified place or not applicable: Secondary | ICD-10-CM | POA: Insufficient documentation

## 2016-05-24 DIAGNOSIS — W548XXA Other contact with dog, initial encounter: Secondary | ICD-10-CM | POA: Diagnosis not present

## 2016-05-24 DIAGNOSIS — Z87891 Personal history of nicotine dependence: Secondary | ICD-10-CM | POA: Diagnosis not present

## 2016-05-24 DIAGNOSIS — Y939 Activity, unspecified: Secondary | ICD-10-CM | POA: Insufficient documentation

## 2016-05-24 DIAGNOSIS — T148XXA Other injury of unspecified body region, initial encounter: Secondary | ICD-10-CM

## 2016-05-24 DIAGNOSIS — S59912A Unspecified injury of left forearm, initial encounter: Secondary | ICD-10-CM | POA: Diagnosis present

## 2016-05-24 MED ORDER — ALPRAZOLAM 0.25 MG PO TABS: 0.2500 mg | ORAL_TABLET | Freq: Every evening | ORAL | 3 refills | Status: DC | PRN

## 2016-05-24 NOTE — ED Triage Notes (Signed)
Dog scratch  ( skin tear)  To left forearm.

## 2016-05-24 NOTE — Discharge Instructions (Signed)
Keep the wound clean with mild soap and water and keep it bandaged for the first 5 days.  The steri-strips will begin to peel off in a week or so.  Follow-up with your doctor or return here for any signs of infection such as swelling, redness, streaks or drainage.

## 2016-05-27 ENCOUNTER — Other Ambulatory Visit: Payer: Self-pay | Admitting: Family Medicine

## 2016-05-28 NOTE — ED Provider Notes (Signed)
[facility] DEPT Provider Note   CSN: [id number] Arrival date & time: 05/24/16  0846     History   Chief Complaint Chief Complaint  Patient presents with  . Abrasion    HPI [name] is a 76 y.o. female.  HPI   [name] is a 76 y.o. female who presents to the Emergency Department complaining of two scratches to the left arm that occurred several days prior to arrival.  Patient states that a family member's dog jumped on her scratching her arm.  She denies bite.  She has been cleaning the wounds and keeping it bandaged.  She denies numbness, swelling or pain to the area.  Reports that dog's rabies vaccines are current and is an inside dog.  Last Td is < 5 years ago.  Past Medical History:  Diagnosis Date  . Anxiety   . Aortic stenosis   . COPD with chronic bronchitis ([place])   . Dementia   . Hyperlipidemia   . Hypertension   . Osteoporosis   . Renal insufficiency   . Rheumatic fever   . Sebaceous cyst   . Thyroid disease 2012   hyperthyroid, had abl;ation therapy treated by endo  . Urosepsis 2012   required several hospitalizations for this    Patient Active Problem List   Diagnosis Date Noted  . Reduced vision 04/29/2015  . Special screening for malignant neoplasms, colon 01/09/2015  . Allergic rhinitis 12/29/2014  . GERD (gastroesophageal reflux disease) 10/18/2014  . Protein-calorie malnutrition, severe ([place]) 10/29/2013  . Cerebellar stroke ([place]) 10/29/2013  . Fall at home 10/28/2013  . Dementia with behavioral disturbance 09/08/2012  . Underweight 07/05/2011  . Weakness generalized 09/07/2010  . Lung nodule 02/16/2010  . CONSTIPATION 09/29/2009  . IMPAIRED FASTING GLUCOSE 07/12/2009  . Depression with anxiety 09/09/2008  . AORTIC VALVE DISEASE 08/10/2008  . Insomnia secondary to depression with anxiety 07/27/2008  . OTHER OSTEOPOROSIS 06/09/2008  . UNSPECIFIED DISORDER OF THYROID 06/20/2007  . Essential hypertension, benign 03/15/2007  .  Diastolic CHF, chronic ([place]) 03/15/2007  . COPD GOLD III with reversibility  03/15/2007    Past Surgical History:  Procedure Laterality Date  . AORTIC VALVE REPLACEMENT     8mm porcine 10/04  . APPENDECTOMY    . BREAST LUMPECTOMY    . CARDIAC VALVE REPLACEMENT    . CHOLECYSTECTOMY    . COLON SURGERY  2005  . COLONOSCOPY  07/23/2008    Single diminutive polyp in the rectum status post cold biopsy removal/ Status post right hemicolectomy/ Colonic ulcers in the upstream end the colon distal to the  anastomosis as described above.  Status post biopsy. The anastomosis itself and neoterminal ileum appeared normal  . ESOPHAGOGASTRODUODENOSCOPY  10/12/09   short segment Barrett's esophage/small hiatal hernia,antral ersions  . LUMBAR LAMINECTOMY    . TONSILLECTOMY      OB History    No data available       Home Medications    Prior to Admission medications   Medication Sig Start Date End Date Taking? Authorizing Provider  albuterol (PROVENTIL) (2.5 MG/3ML) 0.083% nebulizer solution TAKE 3 MLS (2.5 MG TOTAL) BY NEBULIZATION 3 (THREE) TIMES DAILY. Patient taking differently: TAKE 3 MLS (2.5 MG TOTAL) BY NEBULIZATION 3 (THREE) TIMES DAILY PRN 04/20/15   Fayrene Helper, MD  ALPRAZolam Duanne Moron) 0.25 MG tablet Take 1 tablet (0.25 mg total) by mouth at bedtime as needed. 05/24/16   Fayrene Helper, MD  aspirin 81 MG tablet Take  1 tablet (81 mg total) by mouth daily. 11/01/13   Kathie Dike, MD  Calcium Carbonate-Vit D-Min (QC CALCIUM/MINERALS/VITAMIN D) 600-400 MG-UNIT TABS Take 1 tablet by mouth every evening.     Historical Provider, MD  Choline Fenofibrate (FENOFIBRIC ACID) 135 MG CPDR TAKE 1 CAPSULE (135 MG TOTAL) BY MOUTH AT BEDTIME. 02/03/16   Fayrene Helper, MD  clindamycin (CLEOCIN) 300 MG capsule TAKE 2 CAPSULES BY MOUTH 1 HOUR PRIOR TO APPOINTMENT 02/07/16   Fayrene Helper, MD  donepezil (ARICEPT) 10 MG tablet TAKE 1 TABLET (10 MG TOTAL) BY MOUTH AT BEDTIME. 01/31/16    Fayrene Helper, MD  famotidine (PEPCID) 20 MG tablet Take 20 mg by mouth at bedtime.    Historical Provider, MD  Ferrous Fumarate (IRON) 18 MG TBCR Take 1 tablet by mouth every other day.    Historical Provider, MD  folic acid (FOLVITE) 1 MG tablet Take 1 tablet (1 mg total) by mouth daily. 11/01/13   Kathie Dike, MD  ibuprofen (ADVIL,MOTRIN) 200 MG tablet Per bottle as needed    Historical Provider, MD  lansoprazole (PREVACID) 15 MG capsule Take 15 mg by mouth daily at 12 noon.    Historical Provider, MD  loratadine (CLARITIN) 10 MG tablet Take 10 mg by mouth daily.     Historical Provider, MD  losartan (COZAAR) 25 MG tablet TAKE 1 TABLET EVERY DAY AS NEEDED Patient taking differently: TAKE 1 TABLET EVERY DAY 12/09/15   Fayrene Helper, MD  mirtazapine (REMERON) 7.5 MG tablet TAKE 1 TABLET BY MOUTH AT BEDTIME 05/15/16   Fayrene Helper, MD  Multiple Vitamins-Minerals (MULTIVITAMINS THER. W/MINERALS) TABS tablet Take 1 tablet by mouth daily.    Historical Provider, MD  Probiotic Product (PROBIOTIC DAILY PO) Take 1 tablet by mouth daily.     Historical Provider, MD  sertraline (ZOLOFT) 50 MG tablet TAKE 1 TABLET BY MOUTH EVERY DAY 01/31/16   Fayrene Helper, MD  SPIRIVA HANDIHALER 18 MCG inhalation capsule INHALE 1 CAPSULE WITH HANDIHALER AT THE SAME TIME EVERY DAY 03/01/16   Fayrene Helper, MD  Vitamin D, Ergocalciferol, (DRISDOL) 50000 units CAPS capsule TAKE 1 CAPSULE (50,000 UNITS TOTAL) BY MOUTH EVERY 7 (SEVEN) DAYS. 12/01/15   Fayrene Helper, MD    Family History Family History  Problem Relation Age of Onset  . Heart attack Mother 67  . Stroke Father 47  . Hypertension Sister   . Depression Sister   . Alcohol abuse Sister   . Diabetes Son     Social History Social History  Substance Use Topics  . Smoking status: Former Smoker    Packs/day: 0.25    Years: 50.00    Types: Cigarettes    Quit date: 04/30/2012  . Smokeless tobacco: Never Used  . Alcohol use No      Allergies   Levofloxacin; Nitrofurantoin; Bupropion hcl; Cyclobenzaprine hcl; Gadolinium; Lorazepam; Penicillins; Statins; and Sulfonamide derivatives   Review of Systems Review of Systems  Constitutional: Negative for chills and fever.  Musculoskeletal: Negative for arthralgias, back pain and joint swelling.  Skin: Positive for wound.       Two skin tears to left arm  Neurological: Negative for dizziness, weakness and numbness.  Hematological: Does not bruise/bleed easily.  All other systems reviewed and are negative.    Physical Exam Updated Vital Signs BP (!) 186/66 (BP Location: Right Arm)   Pulse 95   Temp 98 F (36.7 C) (Oral)   Resp 20  Wt 41.3 kg   SpO2 94%   BMI 16.64 kg/m   Physical Exam  Constitutional: She is oriented to person, place, and time. She appears well-developed and well-nourished. No distress.  HENT:  Head: Normocephalic and atraumatic.  Cardiovascular: Normal rate, regular rhythm and intact distal pulses.   No murmur heard. Pulmonary/Chest: Effort normal and breath sounds normal. No respiratory distress.  Musculoskeletal: She exhibits no edema or tenderness.  Neurological: She is alert and oriented to person, place, and time. No sensory deficit. She exhibits normal muscle tone. Coordination normal.  Skin: Skin is warm. Laceration noted.  Two small, superficial skin tears to the left upper arm.  Wounds are clean, no edema, drainage or surrounding erythema.   Appear to be healing well.  Nursing note and vitals reviewed.    ED Treatments / Results  Labs (all labs ordered are listed, but only abnormal results are displayed) Labs Reviewed - No data to display  EKG  EKG Interpretation None       Radiology No results found.  Procedures Procedures (including critical care time)  Medications Ordered in ED Medications - No data to display   Initial Impression / Assessment and Plan / ED Course  I have reviewed the triage vital  signs and the nursing notes.  Pertinent labs & imaging results that were available during my care of the patient were reviewed by me and considered in my medical decision making (see chart for details).     Wounds cleaned and skin tears loosely approximated with 2 steri-strips by nursing.  No hx of animal bite.  Wound infection precautions discussed.  Dog vaccines are current  Final Clinical Impressions(s) / ED Diagnoses   Final diagnoses:  Animal scratch    New Prescriptions Discharge Medication List as of 05/24/2016  9:41 AM       Sabrina Waszak, PA-C 05/28/16 2317    Milton Ferguson, MD 05/28/16 2323

## 2016-05-29 NOTE — Telephone Encounter (Signed)
Last vit d level was 3 8 17   = 69  Do u want to renew?

## 2016-05-29 NOTE — Telephone Encounter (Signed)
Refill not appropriate, needs updated lab

## 2016-05-30 ENCOUNTER — Telehealth: Payer: Self-pay | Admitting: Family Medicine

## 2016-05-30 ENCOUNTER — Other Ambulatory Visit: Payer: Self-pay | Admitting: Family Medicine

## 2016-05-30 NOTE — Telephone Encounter (Signed)
[name] w/[employer] had faxed certificate of medical necessity for oxygen on March 26th, 2018 for Dx COPD and has not received a response.  She will resend now.  Please sign and return. Call back [phone] ext [phone].

## 2016-06-07 DIAGNOSIS — Z1283 Encounter for screening for malignant neoplasm of skin: Secondary | ICD-10-CM | POA: Diagnosis not present

## 2016-06-07 DIAGNOSIS — D225 Melanocytic nevi of trunk: Secondary | ICD-10-CM | POA: Diagnosis not present

## 2016-06-07 DIAGNOSIS — L570 Actinic keratosis: Secondary | ICD-10-CM | POA: Diagnosis not present

## 2016-06-07 DIAGNOSIS — X32XXXA Exposure to sunlight, initial encounter: Secondary | ICD-10-CM | POA: Diagnosis not present

## 2016-06-14 NOTE — Telephone Encounter (Signed)
Needs appt (first available) to get recertified for her oxygen. Cannot fax back form to [place] until visit

## 2016-06-21 NOTE — Telephone Encounter (Signed)
Patient has appt w/[facility] May 10th

## 2016-06-29 ENCOUNTER — Other Ambulatory Visit: Payer: Self-pay | Admitting: Family Medicine

## 2016-06-29 ENCOUNTER — Ambulatory Visit (INDEPENDENT_AMBULATORY_CARE_PROVIDER_SITE_OTHER): Payer: Medicare Other | Admitting: Family Medicine

## 2016-06-29 VITALS — BP 140/70 | HR 78 | Resp 20 | Ht 62.0 in | Wt 93.0 lb

## 2016-06-29 DIAGNOSIS — I5032 Chronic diastolic (congestive) heart failure: Secondary | ICD-10-CM

## 2016-06-29 DIAGNOSIS — J449 Chronic obstructive pulmonary disease, unspecified: Secondary | ICD-10-CM | POA: Diagnosis not present

## 2016-06-29 DIAGNOSIS — F418 Other specified anxiety disorders: Secondary | ICD-10-CM

## 2016-06-29 DIAGNOSIS — I10 Essential (primary) hypertension: Secondary | ICD-10-CM | POA: Diagnosis not present

## 2016-06-29 DIAGNOSIS — E43 Unspecified severe protein-calorie malnutrition: Secondary | ICD-10-CM | POA: Diagnosis not present

## 2016-06-29 DIAGNOSIS — R7301 Impaired fasting glucose: Secondary | ICD-10-CM | POA: Diagnosis not present

## 2016-06-29 DIAGNOSIS — Z9981 Dependence on supplemental oxygen: Secondary | ICD-10-CM | POA: Diagnosis not present

## 2016-06-29 DIAGNOSIS — E559 Vitamin D deficiency, unspecified: Secondary | ICD-10-CM | POA: Diagnosis not present

## 2016-06-29 DIAGNOSIS — R7989 Other specified abnormal findings of blood chemistry: Secondary | ICD-10-CM | POA: Diagnosis not present

## 2016-06-29 LAB — CBC
HEMATOCRIT: 38.9 % (ref 35.0–45.0)
HEMOGLOBIN: 12.6 g/dL (ref 11.7–15.5)
MCH: 29.2 pg (ref 27.0–33.0)
MCHC: 32.4 g/dL (ref 32.0–36.0)
MCV: 90.3 fL (ref 80.0–100.0)
MPV: 10 fL (ref 7.5–12.5)
Platelets: 407 10*3/uL — ABNORMAL HIGH (ref 140–400)
RBC: 4.31 MIL/uL (ref 3.80–5.10)
RDW: 13.7 % (ref 11.0–15.0)
WBC: 8.6 10*3/uL (ref 3.8–10.8)

## 2016-06-29 MED ORDER — SERTRALINE HCL 25 MG PO TABS: 25.0000 mg | ORAL_TABLET | Freq: Every day | ORAL | 5 refills | Status: DC

## 2016-06-29 NOTE — Patient Instructions (Signed)
f/u in f/u early October, call if you need me before  Labs today, cBC, cmp, vit D, tSH   Careful not to fall  Medication list is much shorter  Zoloft dose reduced to 25 mg daily  I will contact social worker for help regarding possible Respite services as discussed  Thankful lEAF services are going well!

## 2016-06-30 LAB — T3, FREE: T3 FREE: 2.5 pg/mL (ref 2.3–4.2)

## 2016-06-30 LAB — COMPREHENSIVE METABOLIC PANEL
ALT: 10 U/L (ref 6–29)
AST: 19 U/L (ref 10–35)
Albumin: 4.2 g/dL (ref 3.6–5.1)
Alkaline Phosphatase: 55 U/L (ref 33–130)
BUN: 32 mg/dL — ABNORMAL HIGH (ref 7–25)
CALCIUM: 9.7 mg/dL (ref 8.6–10.4)
CHLORIDE: 102 mmol/L (ref 98–110)
CO2: 28 mmol/L (ref 20–31)
CREATININE: 1.19 mg/dL — AB (ref 0.60–0.93)
Glucose, Bld: 76 mg/dL (ref 65–99)
POTASSIUM: 4.8 mmol/L (ref 3.5–5.3)
Sodium: 139 mmol/L (ref 135–146)
TOTAL PROTEIN: 6.7 g/dL (ref 6.1–8.1)
Total Bilirubin: 0.3 mg/dL (ref 0.2–1.2)

## 2016-06-30 LAB — VITAMIN D 25 HYDROXY (VIT D DEFICIENCY, FRACTURES): VIT D 25 HYDROXY: 59 ng/mL (ref 30–100)

## 2016-06-30 LAB — T4, FREE: Free T4: 1.2 ng/dL (ref 0.8–1.8)

## 2016-06-30 LAB — TSH: TSH: 0.34 mIU/L — ABNORMAL LOW

## 2016-07-02 ENCOUNTER — Encounter: Payer: Self-pay | Admitting: Family Medicine

## 2016-07-02 DIAGNOSIS — Z9981 Dependence on supplemental oxygen: Secondary | ICD-10-CM | POA: Insufficient documentation

## 2016-07-02 NOTE — Progress Notes (Signed)
   [name]     MRN: [id number]      DOB: [birth year]   HPI [name] is here for follow up and re-evaluation of chronic medical conditions, medication management and review of any available recent lab and radiology data.  History is from her caregiver who is her daughter in law, as [name] is unable Needs to be evaluated to re certify her need for oxygen, which is an established one due to her COPD There is also a need to reduce her pill burden Also seeking help with respite care so that her caregivers, who are her son and daughter in ;law can get the break that they need. She has been absolutely  [facility]ing [facility], however, this week, she had to be taken home early on Monday, has not been back since ,  due to several episodes of "unresponsiveness" and reduced intake, also noted to continue at home with poor oral intake  ROS Denies recent fever or chills. Denies sinus pressure, nasal congestion,  or sore throat. Denies chest congestion, productive cough or wheezing. Denies  leg swelling Denies  nausea, vomiting,diarrhea or constipation.   Denies malodorous urine , has  incontinence. Chronic  limitation in mobility. Denies  seizures, numbness, or tingling. Denies depression, anxiety or insomnia. Denies skin break down or rash.   PE  BP 140/70   Pulse 78   Resp 20   Ht 5\' 2"  (1.575 m)   Wt 93 lb (42.2 kg)   SpO2 94%   BMI 17.01 kg/m   Patient alert  and in no cardiopulmonary distress.  HEENT: No facial asymmetry, EOMI,   oropharynx pink and moist.  Neck supple no JVD, no mass.  Chest: Clear to auscultation bilaterally.  CVS: S1, S2 no murmurs, no S3.Regular rate.  ABD: Soft non tender.   Ext: No edema  MS: decreased  ROM spine, shoulders, hips and knees.  Skin: Intact, no ulcerations or rash noted.  Psych: Good eye contact,  Memory loss not  depressed appearing.  CNS: CN 2-12 intact  Assessment & Plan  Dementia with behavioral disturbance Continue  aricept 10 mg daily, social work consult  For Respite services  Depression with anxiety Decreased dose of zoloft with plan to discontinuing  Essential hypertension, benign controlled no med change  Diastolic CHF, chronic Continue cozaar stable on this  Protein-calorie malnutrition, severe Pt to continue nutritional supplements  Supplemental oxygen dependent Pt becomes extremely dyspneic with minimal activity and hypoxic, she has both COPD and heart failure and she requires supplemental oxygen to maintain oxygen levels at a safe value over 90%   COPD GOLD III with reversibility  Continue daily spiriva, prevacid and pepcid

## 2016-07-02 NOTE — Assessment & Plan Note (Addendum)
Continue cozaar stable on this

## 2016-07-02 NOTE — Assessment & Plan Note (Signed)
controlled no med change

## 2016-07-02 NOTE — Assessment & Plan Note (Signed)
Pt becomes extremely dyspneic with minimal activity and hypoxic, she has both COPD and heart failure and she requires supplemental oxygen to maintain oxygen levels at a safe value over 90%

## 2016-07-02 NOTE — Assessment & Plan Note (Signed)
Decreased dose of zoloft with plan to discontinuing

## 2016-07-02 NOTE — Assessment & Plan Note (Signed)
Continue aricept 10 mg daily, social work consult  For Respite services

## 2016-07-02 NOTE — Assessment & Plan Note (Signed)
Continue daily spiriva, prevacid and pepcid

## 2016-07-02 NOTE — Assessment & Plan Note (Signed)
Pt to continue nutritional supplements

## 2016-07-06 ENCOUNTER — Telehealth: Payer: Self-pay

## 2016-07-06 NOTE — Telephone Encounter (Signed)
Spoke with patient's daughter in law to provide requested resources for patient to receive in-home and respite care. Provided resources and contact information for future questions. -nr

## 2016-07-25 ENCOUNTER — Telehealth: Payer: Self-pay | Admitting: Family Medicine

## 2016-07-25 ENCOUNTER — Other Ambulatory Visit: Payer: Self-pay | Admitting: Family Medicine

## 2016-07-25 MED ORDER — PREDNISONE 5 MG (21) PO TBPK: 5.0000 mg | ORAL_TABLET | ORAL | 0 refills | Status: DC

## 2016-07-25 NOTE — Telephone Encounter (Signed)
Patient refused ER and nephew was requesting steriods to help with the sob but would take her to the ER if she did get too bad. Please advise

## 2016-07-25 NOTE — Telephone Encounter (Signed)
Direct contact made with her daughter in law, Olive Bass, stated with breathing treatment Ginger is doing better, prednisone 5 mg dose pack  Has been prescribed , she is aware ,a nd will take her to ED if she deteriorates

## 2016-07-25 NOTE — Telephone Encounter (Signed)
Patient's daughter in law Chrissy came in with patient because she had to pick her up from the [facility] shortly after dropping her off this morning. POX was 90 and is normally 94 and patient was/is having difficulty breathing. Chrissy notice that the patient's face is slightly read.  A breathing treatment was done last Friday.  Chrisssy asked the patient if she wanted to go to the  ER, but patient stated she wanted to go home.  Brandi spoke with Chrissy.

## 2016-07-28 ENCOUNTER — Other Ambulatory Visit: Payer: Self-pay | Admitting: Family Medicine

## 2016-07-29 ENCOUNTER — Other Ambulatory Visit: Payer: Self-pay | Admitting: Family Medicine

## 2016-07-31 ENCOUNTER — Other Ambulatory Visit: Payer: Self-pay

## 2016-09-04 ENCOUNTER — Telehealth: Payer: Self-pay | Admitting: Family Medicine

## 2016-09-04 NOTE — Telephone Encounter (Signed)
New Message  Pt voiced needing an appt and August is too far away due to having problems with her arms, chest, and neck hurting.  Please f/u

## 2016-09-05 NOTE — Telephone Encounter (Signed)
pls schedule appt for her to come in to see me one day next week

## 2016-09-14 ENCOUNTER — Ambulatory Visit (INDEPENDENT_AMBULATORY_CARE_PROVIDER_SITE_OTHER): Payer: Medicare Other | Admitting: Family Medicine

## 2016-09-14 ENCOUNTER — Encounter: Payer: Self-pay | Admitting: Family Medicine

## 2016-09-14 VITALS — BP 120/78 | HR 93 | Temp 97.7°F | Ht 62.0 in | Wt 99.2 lb

## 2016-09-14 DIAGNOSIS — K219 Gastro-esophageal reflux disease without esophagitis: Secondary | ICD-10-CM | POA: Diagnosis not present

## 2016-09-14 DIAGNOSIS — F418 Other specified anxiety disorders: Secondary | ICD-10-CM | POA: Diagnosis not present

## 2016-09-14 DIAGNOSIS — I10 Essential (primary) hypertension: Secondary | ICD-10-CM | POA: Diagnosis not present

## 2016-09-14 DIAGNOSIS — M19012 Primary osteoarthritis, left shoulder: Secondary | ICD-10-CM

## 2016-09-14 DIAGNOSIS — I5032 Chronic diastolic (congestive) heart failure: Secondary | ICD-10-CM

## 2016-09-14 DIAGNOSIS — F5105 Insomnia due to other mental disorder: Secondary | ICD-10-CM

## 2016-09-14 DIAGNOSIS — M19011 Primary osteoarthritis, right shoulder: Secondary | ICD-10-CM

## 2016-09-14 DIAGNOSIS — J3089 Other allergic rhinitis: Secondary | ICD-10-CM

## 2016-09-14 DIAGNOSIS — J449 Chronic obstructive pulmonary disease, unspecified: Secondary | ICD-10-CM

## 2016-09-14 NOTE — Patient Instructions (Addendum)
F/u  End Novemeber, call if  Need me sooner  You have no record  Of shingles vaccine ever being given  Excellent exam, no changes in medication  Please commit to regular stretching exercises of upper extremities, to help with pain and stiffness, and use topical rubs if they help  Thank you  for choosing [facility]. We consider it a privelige to serve you.  Delivering excellent health care in a caring and  compassionate way is our goal.  Partnering with you,  so that together we can achieve this goal is our strategy.

## 2016-09-19 ENCOUNTER — Other Ambulatory Visit: Payer: Self-pay | Admitting: Family Medicine

## 2016-09-23 ENCOUNTER — Encounter: Payer: Self-pay | Admitting: Family Medicine

## 2016-09-23 DIAGNOSIS — M19012 Primary osteoarthritis, left shoulder: Secondary | ICD-10-CM

## 2016-09-23 DIAGNOSIS — M19011 Primary osteoarthritis, right shoulder: Secondary | ICD-10-CM | POA: Insufficient documentation

## 2016-09-23 NOTE — Assessment & Plan Note (Signed)
Controlled with medication

## 2016-09-23 NOTE — Assessment & Plan Note (Signed)
Stable and in no respiratory distress, limited use of rescue medication, lungs clear on exam

## 2016-09-23 NOTE — Assessment & Plan Note (Addendum)
Stable and enjoying the [facility], continue aricept

## 2016-09-23 NOTE — Assessment & Plan Note (Signed)
Controlled, no change in medication  

## 2016-09-23 NOTE — Assessment & Plan Note (Signed)
Stiffness and reduced mobility noted by family members, advised topical rubs, tylenol as needed, and ROM exercises ads tolerated

## 2016-09-23 NOTE — Assessment & Plan Note (Signed)
Not decompensated currently stable

## 2016-09-23 NOTE — Assessment & Plan Note (Signed)
Controlled on current medication ans stable

## 2016-09-23 NOTE — Progress Notes (Signed)
   [name]     MRN: [id number]      DOB: [name] 15, 1942   HPI [name] is here for follow up and re-evaluation of chronic medical conditions, medication management and review of any available recent lab and radiology data.  . The PT denies any adverse reactions to current medications since the last visit.  Concerned about new cough and shortness of breath noted by her siblings when the 3 spent some time together recently , there is also concern about reduced mobility in her shoulders with some increased pain and stiffness. [name] herself says that she has good days and bad days , but feels that she is doing very well overall and has no concerns herself  ROS Denies recent fever or chills. Denies sinus pressure, nasal congestion, ear pain or sore throat. Denies chest congestion, productive cough or wheezing. Denies chest pains, palpitations and leg swelling Denies abdominal pain, nausea, vomiting,diarrhea or constipation.   Denies dysuria, frequency, hesitancy or incontinenDenies joint pain, swelling and limitation in mobility. Denies headaches, seizures, numbness, or tingling. Denies depression, anxiety or insomnia. Denies skin break down or rash.   PE  BP 120/78 (BP Location: Left Arm, Patient Position: Sitting, Cuff Size: Normal)   Pulse 93   Temp 97.7 F (36.5 C) (Other (Comment))   Ht 5\' 2"  (1.575 m)   Wt 99 lb 4 oz (45 kg)   SpO2 91%   BMI 18.15 kg/m   Patient alert and oriented and in no cardiopulmonary distress.  HEENT: No facial asymmetry, EOMI,   oropharynx pink and moist.  Neck supple no JVD, no mass.  Chest: Clear to auscultation bilaterally.Adequate air entry throughout CVS: S1, S2 no murmurs, no S3.Regular rate.  ABD: Soft non tender.   Ext: No edema  MS: Adequate though reduced  ROM spine, shoulders, hips and knees.  Skin: Intact, no ulcerations or rash noted.  Psych: Good eye contact, normal affect. Memory intact not anxious or depressed  appearing.  CNS: CN 2-12 intact, power,  normal throughout.no focal deficits noted.   Assessment & Plan  Osteoarthritis of both shoulders Stiffness and reduced mobility noted by family members, advised topical rubs, tylenol as needed, and ROM exercises ads tolerated  Essential hypertension, benign Controlled, no change in medication   Depression with anxiety Controlled on current medication ans stable  Dementia without behavioral disturbance Stable and enjoying the [facility], continue aricept  Insomnia secondary to depression with anxiety Controlled with medication  Diastolic CHF, chronic Not decompensated currently stable  COPD GOLD III with reversibility  Stable and in no respiratory distress, limited use of rescue medication, lungs clear on exam  Allergic rhinitis Controlled, no change in medication   GERD (gastroesophageal reflux disease) Controlled, no change in medication

## 2016-10-10 ENCOUNTER — Encounter (HOSPITAL_COMMUNITY): Payer: Self-pay

## 2016-10-10 ENCOUNTER — Emergency Department (HOSPITAL_COMMUNITY): Payer: Medicare Other

## 2016-10-10 ENCOUNTER — Emergency Department (HOSPITAL_COMMUNITY)
Admission: EM | Admit: 2016-10-10 | Discharge: 2016-10-10 | Disposition: A | Discharge status: Home / Self Care | Payer: Medicare Other | Attending: Emergency Medicine | Admitting: Emergency Medicine

## 2016-10-10 DIAGNOSIS — Z87891 Personal history of nicotine dependence: Secondary | ICD-10-CM | POA: Diagnosis not present

## 2016-10-10 DIAGNOSIS — Z7982 Long term (current) use of aspirin: Secondary | ICD-10-CM | POA: Insufficient documentation

## 2016-10-10 DIAGNOSIS — I10 Essential (primary) hypertension: Secondary | ICD-10-CM | POA: Diagnosis not present

## 2016-10-10 DIAGNOSIS — R69 Illness, unspecified: Secondary | ICD-10-CM | POA: Diagnosis not present

## 2016-10-10 DIAGNOSIS — R404 Transient alteration of awareness: Secondary | ICD-10-CM | POA: Diagnosis not present

## 2016-10-10 DIAGNOSIS — F0390 Unspecified dementia without behavioral disturbance: Secondary | ICD-10-CM | POA: Diagnosis not present

## 2016-10-10 DIAGNOSIS — I5032 Chronic diastolic (congestive) heart failure: Secondary | ICD-10-CM | POA: Diagnosis not present

## 2016-10-10 DIAGNOSIS — J449 Chronic obstructive pulmonary disease, unspecified: Secondary | ICD-10-CM | POA: Diagnosis not present

## 2016-10-10 DIAGNOSIS — R4182 Altered mental status, unspecified: Secondary | ICD-10-CM | POA: Diagnosis present

## 2016-10-10 DIAGNOSIS — Z79899 Other long term (current) drug therapy: Secondary | ICD-10-CM | POA: Insufficient documentation

## 2016-10-10 HISTORY — DX: Unspecified osteoarthritis, unspecified site: M19.90

## 2016-10-10 HISTORY — DX: Depression, unspecified: F32.A

## 2016-10-10 HISTORY — DX: Major depressive disorder, single episode, unspecified: F32.9

## 2016-10-10 LAB — BASIC METABOLIC PANEL
ANION GAP: 7 (ref 5–15)
BUN: 20 mg/dL (ref 6–20)
CALCIUM: 9.3 mg/dL (ref 8.9–10.3)
CO2: 28 mmol/L (ref 22–32)
Chloride: 105 mmol/L (ref 101–111)
Creatinine, Ser: 0.9 mg/dL (ref 0.44–1.00)
GLUCOSE: 104 mg/dL — AB (ref 65–99)
Potassium: 4.2 mmol/L (ref 3.5–5.1)
SODIUM: 140 mmol/L (ref 135–145)

## 2016-10-10 LAB — CBC WITH DIFFERENTIAL/PLATELET
BASOS ABS: 0 10*3/uL (ref 0.0–0.1)
BASOS PCT: 0 %
EOS ABS: 0.1 10*3/uL (ref 0.0–0.7)
EOS PCT: 1 %
HCT: 41.3 % (ref 36.0–46.0)
Hemoglobin: 13.3 g/dL (ref 12.0–15.0)
Lymphocytes Relative: 11 %
Lymphs Abs: 1.1 10*3/uL (ref 0.7–4.0)
MCH: 29.6 pg (ref 26.0–34.0)
MCHC: 32.2 g/dL (ref 30.0–36.0)
MCV: 91.8 fL (ref 78.0–100.0)
Monocytes Absolute: 0.6 10*3/uL (ref 0.1–1.0)
Monocytes Relative: 6 %
Neutro Abs: 8 10*3/uL — ABNORMAL HIGH (ref 1.7–7.7)
Neutrophils Relative %: 82 %
PLATELETS: 303 10*3/uL (ref 150–400)
RBC: 4.5 MIL/uL (ref 3.87–5.11)
RDW: 13.5 % (ref 11.5–15.5)
WBC: 9.8 10*3/uL (ref 4.0–10.5)

## 2016-10-10 LAB — TROPONIN I

## 2016-10-10 NOTE — ED Notes (Signed)
RN from the facility here in ed with pt.  Reports staff came to get her because pt was " having a hard time."  When RN saw her, she was gray, not speaking and shaking.

## 2016-10-10 NOTE — ED Provider Notes (Signed)
[facility] DEPT Provider Note   CSN: [id number] Arrival date & time: 10/10/16  1401     History   Chief Complaint Chief Complaint  Patient presents with  . Altered Mental Status    HPI [name] is a 76 y.o. female.  HPI  Patient with dementia presents from her adult daycare facility after an episode of transient change in cognition, interactivity. The patient's dementia prohibits her ability to participate in the initial interview substantially, but she currently denies pain, lightheadedness, states that she knows where she is, recalls feeling slightly unwell earlier, no current discomfort. Patient is here with 2 sons, who assists with the history of present illness. Per report the patient was at her adult daycare facility when she had an episode of less interactivity, appeared to have some difficulty with breathing, appeared lightheaded, and initial blood pressure reading was 85 systolic. EMS reports that on their arrival the patient was not in distress, had a blood pressure of greater than 308 systolic, and she remained awake and alert throughout transport. Level V caveat secondary to dementia.    Past Medical History:  Diagnosis Date  . Anxiety   . Aortic stenosis   . Arthritis   . COPD with chronic bronchitis ([place])   . Dementia   . Depression   . Hyperlipidemia   . Hypertension   . Osteoporosis   . Renal insufficiency   . Rheumatic fever   . Sebaceous cyst   . Thyroid disease 2012   hyperthyroid, had abl;ation therapy treated by endo  . Urosepsis 2012   required several hospitalizations for this    Patient Active Problem List   Diagnosis Date Noted  . Osteoarthritis of both shoulders 09/23/2016  . Supplemental oxygen dependent 07/02/2016  . Reduced vision 04/29/2015  . Allergic rhinitis 12/29/2014  . GERD (gastroesophageal reflux disease) 10/18/2014  . Protein-calorie malnutrition, severe ([place]) 10/29/2013  . Cerebellar stroke ([place]) 10/29/2013  .  Fall at home 10/28/2013  . Dementia without behavioral disturbance 09/08/2012  . Underweight 07/05/2011  . Lung nodule 02/16/2010  . IMPAIRED FASTING GLUCOSE 07/12/2009  . Depression with anxiety 09/09/2008  . AORTIC VALVE DISEASE 08/10/2008  . Insomnia secondary to depression with anxiety 07/27/2008  . OTHER OSTEOPOROSIS 06/09/2008  . UNSPECIFIED DISORDER OF THYROID 06/20/2007  . Essential hypertension, benign 03/15/2007  . Diastolic CHF, chronic ([place]) 03/15/2007  . COPD GOLD III with reversibility  03/15/2007    Past Surgical History:  Procedure Laterality Date  . AORTIC VALVE REPLACEMENT     48mm porcine 10/04  . APPENDECTOMY    . BREAST LUMPECTOMY    . CARDIAC VALVE REPLACEMENT    . CHOLECYSTECTOMY    . COLON SURGERY  2005  . COLONOSCOPY  07/23/2008    Single diminutive polyp in the rectum status post cold biopsy removal/ Status post right hemicolectomy/ Colonic ulcers in the upstream end the colon distal to the  anastomosis as described above.  Status post biopsy. The anastomosis itself and neoterminal ileum appeared normal  . ESOPHAGOGASTRODUODENOSCOPY  10/12/09   short segment Barrett's esophage/small hiatal hernia,antral ersions  . LUMBAR LAMINECTOMY    . TONSILLECTOMY      OB History    No data available       Home Medications    Prior to Admission medications   Medication Sig Start Date End Date Taking? Authorizing Provider  albuterol (PROVENTIL) (2.5 MG/3ML) 0.083% nebulizer solution TAKE 3 MLS (2.5 MG TOTAL) BY NEBULIZATION 3 (THREE) TIMES DAILY. 09/19/16  Yes Fayrene Helper, MD  aspirin 81 MG tablet Take 1 tablet (81 mg total) by mouth daily. 11/01/13  Yes Kathie Dike, MD  donepezil (ARICEPT) 10 MG tablet TAKE 1 TABLET BY MOUTH AT BEDTIME. 07/31/16  Yes Fayrene Helper, MD  famotidine (PEPCID) 20 MG tablet Take 20 mg by mouth at bedtime.   Yes [provider]  lansoprazole (PREVACID) 15 MG capsule Take twocapsules 30 minutes before food  06/29/16  Yes Fayrene Helper, MD  losartan (COZAAR) 25 MG tablet TAKE 1 TABLET EVERY DAY AS NEEDED 05/31/16  Yes Fayrene Helper, MD  mirtazapine (REMERON) 7.5 MG tablet TAKE 1 TABLET BY MOUTH AT BEDTIME 05/15/16  Yes Fayrene Helper, MD  sertraline (ZOLOFT) 25 MG tablet Take 1 tablet (25 mg total) by mouth daily. 06/29/16  Yes Fayrene Helper, MD  SPIRIVA HANDIHALER 18 MCG inhalation capsule INHALE 1 CAPSULE WITH HANDIHALER AT THE SAME TIME EVERY DAY 07/31/16  Yes Fayrene Helper, MD    Family History Family History  Problem Relation Age of Onset  . Heart attack Mother 36  . Stroke Father 62  . Hypertension Sister   . Depression Sister   . Alcohol abuse Sister   . Diabetes Son     Social History Social History  Substance Use Topics  . Smoking status: Former Smoker    Packs/day: 0.25    Years: 50.00    Types: Cigarettes    Quit date: 04/30/2012  . Smokeless tobacco: Never Used  . Alcohol use No     Allergies   Levofloxacin; Nitrofurantoin; Bupropion hcl; Cyclobenzaprine hcl; Gadolinium; Lorazepam; Penicillins; Statins; and Sulfonamide derivatives   Review of Systems Review of Systems  Unable to perform ROS: Dementia     Physical Exam Updated Vital Signs BP (!) 141/52 (BP Location: Left Arm)   Pulse 79   Temp 97.7 F (36.5 C) (Oral)   Resp 20   SpO2 97%   Physical Exam  Constitutional: She has a sickly appearance. No distress.  HENT:  Head: Normocephalic and atraumatic.  Eyes: Conjunctivae and EOM are normal.  Cardiovascular: Normal rate and regular rhythm.   Mechanical click as well as aortic murmur both appreciable  Pulmonary/Chest: Effort normal and breath sounds normal. No stridor. No respiratory distress.  Abdominal: She exhibits no distension.  Musculoskeletal: She exhibits no edema.  Neurological: She is alert. She displays atrophy. She displays no tremor. No cranial nerve deficit. She exhibits normal muscle tone. She displays no  seizure activity.  Skin: Skin is warm and dry.  Psychiatric: She is withdrawn. Cognition and memory are impaired.  Nursing note and vitals reviewed.    ED Treatments / Results  Labs (all labs ordered are listed, but only abnormal results are displayed) Labs Reviewed  CBC WITH DIFFERENTIAL/PLATELET - Abnormal; Notable for the following:       Result Value   Neutro Abs 8.0 (*)    All other components within normal limits  BASIC METABOLIC PANEL - Abnormal; Notable for the following:    Glucose, Bld 104 (*)    All other components within normal limits  TROPONIN I    EKG  EKG Interpretation  Date/Time:  Tuesday October 10 2016 14:27:34 EDT Ventricular Rate:  69 PR Interval:    QRS Duration: 91 QT Interval:  408 QTC Calculation: 438 R Axis:   -5 Text Interpretation:  Sinus rhythm Left ventricular hypertrophy Inferior infarct, age indeterminate Anterior Q waves, possibly due to LVH Abnormal ekg  Confirmed by Carmin Muskrat [phone]) on 10/10/2016 2:35:28 PM       Radiology Dg Chest 2 View  Result Date: 10/10/2016 CLINICAL DATA:  Dementia patient. Question hypoxia. Altered mental status. EXAM: CHEST  2 VIEW COMPARISON:  01/16/2014 FINDINGS: Lateral view degraded by patient arm position. There is also posterior artifact on the lateral view. Numerous leads and wires project over the chest. Midline trachea. Normal heart size. Atherosclerosis in the transverse aorta. No pleural effusion or pneumothorax. No lobar consolidation. Prior median sternotomy. Mild hyperinflation. IMPRESSION: No acute cardiopulmonary disease. Aortic Atherosclerosis (ICD10-I70.0). Electronically Signed   By: Abigail Miyamoto M.D.   On: 10/10/2016 15:07    Procedures Procedures (including critical care time)  Medications Ordered in ED Medications - No data to display   Initial Impression / Assessment and Plan / ED Course  I have reviewed the triage vital signs and the nursing notes.  Pertinent labs & imaging  results that were available during my care of the patient were reviewed by me and considered in my medical decision making (see chart for details).  5:01 PM Patient in no distress on repeat exam. The patient is now accompanied by her 2 sisters. We reviewed all findings, including reassuring labs, x-ray, EKG. With the patient's history of valve repair and aortic stenosis there is some suspicion for progressive aortic stenosis as contributing to the episode. No evidence for ACS, pneumonia, bacteremia, sepsis, anemia. With no ongoing complaints, unremarkable vital signs, reassuring results, patient will follow up with primary care and cardiology for consideration of echocardiogram as well as further evaluation and management. Family members acknowledge importance of ongoing follow-up.  Final Clinical Impressions(s) / ED Diagnoses   Final diagnoses:  Transient alteration of awareness     Carmin Muskrat, MD 10/10/16 [phone]

## 2016-10-10 NOTE — ED Triage Notes (Signed)
Per ems, pt was at an adult day care center and staff noticed she was "not responding right."  Reports RN at the facility thought she looked gray so they put her on 02.  RN told ems pt was not responding but pt was responsive when they arrived.  CBG 104.  RN also says initial bp was 94'V systolic.  EMS reports pt was not hypotensive for them.  Pt alert.  Pt reports feeling generalized weakness.   Denies pain.   PT 96% on room air with ems.

## 2016-10-10 NOTE — ED Notes (Signed)
Pt stable and ready for transport to [facility]. Report given to Audrea Muscat, RN.

## 2016-10-10 NOTE — Discharge Instructions (Signed)
As discussed, today's evaluation has been generally reassuring. However, with your history of valve replacement and aortic stenosis is important that he follow up with both your primary care physician and your cardiologist for continued evaluation.   Return here for concerning changes in your condition.

## 2016-10-11 ENCOUNTER — Encounter: Payer: Self-pay | Admitting: Cardiology

## 2016-10-11 NOTE — Progress Notes (Signed)
Cardiology Office Note  Date: 10/12/2016   ID: Sabrina, [name] 1941-02-17, MRN [id number]  PCP: Fayrene Helper, MD  Evaluating Cardiologist: Rozann Lesches, MD   Chief Complaint  Patient presents with  . Cardiac evaluation    History of Present Illness: [name] is a 76 y.o. female that I have not seen in the office since 2012. She is referred back to the office by Dr. Vanita Panda after ER visit on August 21 due to transient change in mental status and interactivity. History is somewhat difficult as she has dementia. She lives with a family member and also attends an adult day care facility. She apparently had a frank syncopal event that was fairly sudden based on discussion with family members today while she was at the adult day care facility. She was found to be hypotensive after this with systolic blood pressure in the 80s and heart rate in the 60s to 70s, eventually returned to baseline when EMS arrived and did not require any type of resuscitative efforts. Lab work was largely unremarkable including normal troponin I in the ER. She had no obvious arrhythmias under observation in her ECG showed sinus rhythm as detailed below.  I had a long discussion with family members today. Patient is a retired Marine scientist with advancing dementia, her sister present today is also a former Marine scientist. It sounds like the family in general is somewhat divided as to how aggressively to manage the patient from a medical perspective. It is unclear what precipitated the episode in question. Most likely cardiac cause would've been a rhythm change resulting in low blood pressure, possibly a vasovagal event. It is unclear whether she had any chest pain or palpitations at the time. Valvular heart disease in an of itself would be an unusual cause of this type of sudden event.  I reviewed her medications. She is on Cozaar 25 mg daily for blood pressure control. I am told that her blood pressure usually in the 703J to  009F systolic.  Last echocardiogram was in 2015 as detailed below.  Past Medical History:  Diagnosis Date  . Anxiety   . Aortic stenosis    Status post bioprosthetic AVR 2004  . Arthritis   . COPD with chronic bronchitis ([place])   . Dementia   . Depression   . Essential hypertension   . History of hyperthyroidism 2012   Status post ablation  . Hyperlipidemia   . Osteoporosis   . Renal insufficiency   . Rheumatic fever   . Sebaceous cyst   . Urosepsis 2012   Recurrent    Past Surgical History:  Procedure Laterality Date  . AORTIC VALVE REPLACEMENT     41mm porcine 10/04  . APPENDECTOMY    . BREAST LUMPECTOMY    . CARDIAC VALVE REPLACEMENT    . CHOLECYSTECTOMY    . COLON SURGERY  2005  . COLONOSCOPY  07/23/2008    Single diminutive polyp in the rectum status post cold biopsy removal/ Status post right hemicolectomy/ Colonic ulcers in the upstream end the colon distal to the  anastomosis as described above.  Status post biopsy. The anastomosis itself and neoterminal ileum appeared normal  . ESOPHAGOGASTRODUODENOSCOPY  10/12/09   short segment Barrett's esophage/small hiatal hernia,antral ersions  . LUMBAR LAMINECTOMY    . TONSILLECTOMY      Current Outpatient Prescriptions  Medication Sig Dispense Refill  . albuterol (PROVENTIL) (2.5 MG/3ML) 0.083% nebulizer solution TAKE 3 MLS (2.5 MG TOTAL) BY NEBULIZATION  3 (THREE) TIMES DAILY. 300 mL 0  . aspirin 81 MG tablet Take 1 tablet (81 mg total) by mouth daily. 30 tablet 1  . donepezil (ARICEPT) 10 MG tablet TAKE 1 TABLET BY MOUTH AT BEDTIME. 90 tablet 1  . famotidine (PEPCID) 20 MG tablet Take 20 mg by mouth at bedtime.    . lansoprazole (PREVACID) 15 MG capsule Take twocapsules 30 minutes before food 60 capsule 11  . losartan (COZAAR) 25 MG tablet Take 0.5 tablets (12.5 mg total) by mouth daily. 45 tablet 6  . mirtazapine (REMERON) 7.5 MG tablet TAKE 1 TABLET BY MOUTH AT BEDTIME 90 tablet 1  . sertraline (ZOLOFT) 25 MG  tablet Take 1 tablet (25 mg total) by mouth daily. 30 tablet 5  . SPIRIVA HANDIHALER 18 MCG inhalation capsule INHALE 1 CAPSULE WITH HANDIHALER AT THE SAME TIME EVERY DAY 90 capsule 1   No current facility-administered medications for this visit.    Allergies:  Levofloxacin; Nitrofurantoin; Bupropion hcl; Cyclobenzaprine hcl; Gadolinium; Lorazepam; Penicillins; Statins; and Sulfonamide derivatives   Social History: The patient  reports that she quit smoking about 4 years ago. Her smoking use included Cigarettes. She has a 12.50 pack-year smoking history. She has never used smokeless tobacco. She reports that she does not drink alcohol or use drugs.   Family History: The patient's family history includes Alcohol abuse in her sister; Depression in her sister; Diabetes in her son; Heart attack (age of onset: 10) in her mother; Hypertension in her sister; Stroke (age of onset: 61) in her father.   ROS:  Please see the history of present illness. Unable to obtain completely due to dementia.   Physical Exam: VS:  BP 122/70   Pulse 76   Ht 5\' 3"  (1.6 m)   Wt 97 lb (44 kg)   SpO2 98%   BMI 17.18 kg/m , BMI Body mass index is 17.18 kg/m.  Wt Readings from Last 3 Encounters:  10/12/16 97 lb (44 kg)  09/14/16 99 lb 4 oz (45 kg)  06/29/16 93 lb (42.2 kg)    General: Elderly woman, appears comfortable seated in wheelchair. HEENT: Conjunctiva and lids normal, oropharynx clear. Neck: Supple, no elevated JVP or carotid bruits, no thyromegaly. Lungs: Clear to auscultation, nonlabored breathing at rest. Cardiac: Regular rate and rhythm, no S3, soft systolic murmur and 2/6 diastolic murmur, no pericardial rub. Abdomen: Soft, nontender, bowel sounds present, no guarding or rebound. Extremities: No pitting edema, distal pulses 2+. Skin: Warm and dry. Musculoskeletal: No kyphosis. Neuropsychiatric: Alert and oriented x1, affect grossly appropriate.  ECG: I personally reviewed the tracing from  10/10/2016 which showed sinus rhythm with LVH and repolarization abnormalities.  Recent Labwork: 06/29/2016: ALT 10; AST 19; TSH 0.34 10/10/2016: BUN 20; Creatinine, Ser 0.90; Hemoglobin 13.3; Platelets 303; Potassium 4.2; Sodium 140     Component Value Date/Time   CHOL 198 10/28/2015 0952   TRIG 118 10/28/2015 0952   HDL 65 10/28/2015 0952   CHOLHDL 3.0 10/28/2015 0952   VLDL 24 10/28/2015 0952   LDLCALC 109 10/28/2015 0952    Other Studies Reviewed Today:  Echocardiogram 10/29/2013: Study Conclusions  - Left ventricle: The cavity size was normal. Wall thickness was increased in a pattern of mild LVH. Systolic function was vigorous. The estimated ejection fraction was in the range of 65% to 70%. Doppler parameters are consistent with abnormal left ventricular relaxation (grade 1 diastolic dysfunction). Doppler parameters are consistent with elevated ventricular end-diastolic filling pressure. - Aortic valve: A  bioprosthesis was present - 21 mm porcine valve based on records. There was no significant regurgitation. Overall poorly visualized, no gradients are reported. There is no obvious perivalvular leak. - Mitral valve: Calcified annulus. There was trivial regurgitation. - Right atrium: Central venous pressure (est): 3 mm Hg. - Tricuspid valve: There was trivial regurgitation. - Pulmonary arteries: Systolic pressure could not be accurately estimated. - Pericardium, extracardiac: There was no pericardial effusion.  Impressions:  - Overall limited images. There is mild LVH with LVEF approximately 91-69%, grade 1 diastolic dysfunction with increased filling pressures. Bioprosthetic aortic valve is poorly visualized, no obvious aortic regurgitation or perivalvular leak, no gradients reported. Trivial tricuspid regurgitation, unable to assess PASP.  Carotid Dopplers 10/30/2013: IMPRESSION: Mild to moderate carotid atherosclerosis. No  hemodynamically significant ICA stenosis by ultrasound.  Chest x-ray 10/10/2016: FINDINGS: Lateral view degraded by patient arm position. There is also posterior artifact on the lateral view. Numerous leads and wires project over the chest. Midline trachea. Normal heart size. Atherosclerosis in the transverse aorta. No pleural effusion or pneumothorax. No lobar consolidation. Prior median sternotomy. Mild hyperinflation.  IMPRESSION: No acute cardiopulmonary disease.  Aortic Atherosclerosis (ICD10-I70.0).  Assessment and Plan:  1. Episode of syncope as outlined above, etiology is not clear. I had a long discussion with the family members present today as detailed above. Recommendation at this time is to reduce Cozaar to 12.5 mg daily, also need to make sure that she is adequately hydrated, family members indicated that dehydration has been a concern. We will obtain an echocardiogram to follow-up on her AVR, I do suspect that she has aortic regurgitation based on cardiac murmur, but would not necessarily expect that this is the cause of the event.  2. History of hypertension, on Cozaar. Dose being reduced to avoid hypotension.  3. Advanced dementia.  Current medicines were reviewed with the patient today.   Orders Placed This Encounter  Procedures  . ECHOCARDIOGRAM COMPLETE    Disposition: Call with test results.  Signed, Satira Sark, MD, [facility] 10/12/2016 3:55 PM    [place] [facility] [address], [place], [place] [address] Phone: [phone]; Fax: [phone]

## 2016-10-12 ENCOUNTER — Ambulatory Visit (INDEPENDENT_AMBULATORY_CARE_PROVIDER_SITE_OTHER): Payer: Medicare Other | Admitting: Cardiology

## 2016-10-12 ENCOUNTER — Encounter: Payer: Self-pay | Admitting: Cardiology

## 2016-10-12 VITALS — BP 122/70 | HR 76 | Ht 63.0 in | Wt 97.0 lb

## 2016-10-12 DIAGNOSIS — I10 Essential (primary) hypertension: Secondary | ICD-10-CM | POA: Diagnosis not present

## 2016-10-12 DIAGNOSIS — I359 Nonrheumatic aortic valve disorder, unspecified: Secondary | ICD-10-CM | POA: Diagnosis not present

## 2016-10-12 DIAGNOSIS — F0391 Unspecified dementia with behavioral disturbance: Secondary | ICD-10-CM

## 2016-10-12 DIAGNOSIS — R55 Syncope and collapse: Secondary | ICD-10-CM

## 2016-10-12 MED ORDER — LOSARTAN POTASSIUM 25 MG PO TABS: 12.5000 mg | ORAL_TABLET | Freq: Every day | ORAL | 6 refills | Status: DC

## 2016-10-12 NOTE — Patient Instructions (Signed)
Medication Instructions:  Decrease Cozaar to 12.5 mg daily ( 1/2 tablet)  Labwork: NONE  Testing/Procedures: Your physician has requested that you have an echocardiogram. Echocardiography is a painless test that uses sound waves to create images of your heart. It provides your doctor with information about the size and shape of your heart and how well your heart's chambers and valves are working. This procedure takes approximately one hour. There are no restrictions for this procedure.    Follow-Up: Your physician recommends that you schedule a follow-up appointment in: TO BE DETERMINED- WILL CALL WITH TEST RESULTS    Any Other Special Instructions Will Be Listed Below (If Applicable).     If you need a refill on your cardiac medications before your next appointment, please call your pharmacy.

## 2016-10-17 ENCOUNTER — Ambulatory Visit (HOSPITAL_COMMUNITY)
Admission: RE | Admit: 2016-10-17 | Discharge: 2016-10-17 | Disposition: A | Discharge status: Home / Self Care | Payer: Medicare Other | Source: Ambulatory Visit | Attending: Cardiology | Admitting: Cardiology

## 2016-10-17 DIAGNOSIS — I080 Rheumatic disorders of both mitral and aortic valves: Secondary | ICD-10-CM | POA: Insufficient documentation

## 2016-10-17 DIAGNOSIS — I359 Nonrheumatic aortic valve disorder, unspecified: Secondary | ICD-10-CM

## 2016-10-17 DIAGNOSIS — I5030 Unspecified diastolic (congestive) heart failure: Secondary | ICD-10-CM | POA: Diagnosis not present

## 2016-10-17 LAB — ECHOCARDIOGRAM COMPLETE
AV Area VTI index: 0.55 cm2/m2
AV Peak grad: 30 mmHg
AV pk vel: 275 cm/s
AV vel: 0.76
AVAREAMEANV: 0.77 cm2
AVAREAMEANVIN: 0.55 cm2/m2
AVAREAVTI: 0.8 cm2
AVCELMEANRAT: 0.43
AVG: 13 mmHg
Ao pk vel: 0.45 m/s
CHL CUP AV PEAK INDEX: 0.58
CHL CUP DOP CALC LVOT VTI: 25 cm
CHL CUP MV DEC (S): 257
DOP CAL AO MEAN VELOCITY: 163 cm/s
EERAT: 17.01
EWDT: 257 ms
FS: 45 % — AB (ref 28–44)
IV/PV OW: 0.91
LA ID, A-P, ES: 26 mm
LA vol index: 23.1 mL/m2
LA vol: 32.1 mL
LADIAMINDEX: 1.87 cm/m2
LAVOLA4C: 25.3 mL
LDCA: 1.77 cm2
LEFT ATRIUM END SYS DIAM: 26 mm
LV E/e' medial: 17.01
LV E/e'average: 17.01
LV PW d: 10 mm — AB (ref 0.6–1.1)
LV TDI E'MEDIAL: 6.85
LV dias vol: 40 mL — AB (ref 46–106)
LV sys vol index: 10 mL/m2
LVDIAVOLIN: 29 mL/m2
LVELAT: 7.29 cm/s
LVOT peak grad rest: 6 mmHg
LVOTD: 15 mm
LVOTPV: 124 cm/s
LVOTSV: 44 mL
LVOTVTI: 0.43 cm
LVSYSVOL: 14 mL (ref 14–42)
Lateral S' vel: 9.68 cm/s
MV Peak grad: 6 mmHg
MV pk A vel: 129 m/s
MVPKEVEL: 124 m/s
P 1/2 time: 260 ms
RV TAPSE: 13.6 mm
Simpson's disk: 65
Stroke v: 26 ml
TDI e' lateral: 7.29
VTI: 58.6 cm
Valve area index: 0.55
Valve area: 0.76 cm2

## 2016-10-17 NOTE — Progress Notes (Signed)
*  PRELIMINARY RESULTS* Echocardiogram 2D Echocardiogram has been performed.  [name] 10/17/2016, 2:04 PM

## 2016-10-18 ENCOUNTER — Telehealth: Payer: Self-pay

## 2016-10-18 NOTE — Telephone Encounter (Signed)
Called pt. No answer, left message for pt to return call.  

## 2016-10-18 NOTE — Telephone Encounter (Signed)
-----   Message from Satira Sark, MD sent at 10/18/2016  8:59 AM EDT ----- Results reviewed. LVEF remains normal range of 60-65% with only mild diastolic dysfunction. Aortic prosthesis is not well seen, although with mean gradient of only 13 mmHg this would suggest grossly normal valve function rather than what was reported as mild AS. Importantly, there is also only mild aortic regurgitation. No additional cardiac testing is planned at this time. A copy of this test should be forwarded to Fayrene Helper, MD.

## 2016-11-15 ENCOUNTER — Other Ambulatory Visit: Payer: Self-pay | Admitting: Family Medicine

## 2016-12-04 ENCOUNTER — Other Ambulatory Visit: Payer: Self-pay

## 2016-12-04 MED ORDER — TIOTROPIUM BROMIDE MONOHYDRATE 18 MCG IN CAPS: ORAL_CAPSULE | RESPIRATORY_TRACT | 1 refills | Status: DC

## 2016-12-04 NOTE — Telephone Encounter (Signed)
Seen [phone] 

## 2016-12-06 ENCOUNTER — Other Ambulatory Visit: Payer: Self-pay | Admitting: Family Medicine

## 2016-12-06 NOTE — Telephone Encounter (Signed)
Seen [phone] 

## 2016-12-19 ENCOUNTER — Emergency Department (HOSPITAL_COMMUNITY): Payer: Medicare Other

## 2016-12-19 ENCOUNTER — Encounter (HOSPITAL_COMMUNITY): Payer: Self-pay | Admitting: *Deleted

## 2016-12-19 ENCOUNTER — Emergency Department (HOSPITAL_COMMUNITY)
Admission: EM | Admit: 2016-12-19 | Discharge: 2016-12-19 | Disposition: A | Discharge status: Home / Self Care | Payer: Medicare Other | Attending: Emergency Medicine | Admitting: Emergency Medicine

## 2016-12-19 DIAGNOSIS — Z79899 Other long term (current) drug therapy: Secondary | ICD-10-CM | POA: Diagnosis not present

## 2016-12-19 DIAGNOSIS — R1084 Generalized abdominal pain: Secondary | ICD-10-CM | POA: Diagnosis not present

## 2016-12-19 DIAGNOSIS — Z880 Allergy status to penicillin: Secondary | ICD-10-CM | POA: Insufficient documentation

## 2016-12-19 DIAGNOSIS — R197 Diarrhea, unspecified: Secondary | ICD-10-CM | POA: Diagnosis not present

## 2016-12-19 DIAGNOSIS — J449 Chronic obstructive pulmonary disease, unspecified: Secondary | ICD-10-CM | POA: Diagnosis not present

## 2016-12-19 DIAGNOSIS — I5032 Chronic diastolic (congestive) heart failure: Secondary | ICD-10-CM | POA: Insufficient documentation

## 2016-12-19 DIAGNOSIS — F0390 Unspecified dementia without behavioral disturbance: Secondary | ICD-10-CM | POA: Insufficient documentation

## 2016-12-19 DIAGNOSIS — I110 Hypertensive heart disease with heart failure: Secondary | ICD-10-CM | POA: Insufficient documentation

## 2016-12-19 DIAGNOSIS — R531 Weakness: Secondary | ICD-10-CM

## 2016-12-19 DIAGNOSIS — Z7982 Long term (current) use of aspirin: Secondary | ICD-10-CM | POA: Insufficient documentation

## 2016-12-19 DIAGNOSIS — N200 Calculus of kidney: Secondary | ICD-10-CM | POA: Diagnosis not present

## 2016-12-19 DIAGNOSIS — Z87891 Personal history of nicotine dependence: Secondary | ICD-10-CM | POA: Diagnosis not present

## 2016-12-19 LAB — URINALYSIS, ROUTINE W REFLEX MICROSCOPIC
Bacteria, UA: NONE SEEN
Bilirubin Urine: NEGATIVE
Glucose, UA: NEGATIVE mg/dL
Ketones, ur: 5 mg/dL — AB
Leukocytes, UA: NEGATIVE
Nitrite: NEGATIVE
PROTEIN: NEGATIVE mg/dL
Specific Gravity, Urine: 1.008 (ref 1.005–1.030)
pH: 5 (ref 5.0–8.0)

## 2016-12-19 LAB — CBC WITH DIFFERENTIAL/PLATELET
BASOS ABS: 0 10*3/uL (ref 0.0–0.1)
BASOS PCT: 0 %
EOS ABS: 0.1 10*3/uL (ref 0.0–0.7)
Eosinophils Relative: 1 %
HCT: 39.9 % (ref 36.0–46.0)
HEMOGLOBIN: 13.2 g/dL (ref 12.0–15.0)
LYMPHS ABS: 1.6 10*3/uL (ref 0.7–4.0)
Lymphocytes Relative: 20 %
MCH: 29.5 pg (ref 26.0–34.0)
MCHC: 33.1 g/dL (ref 30.0–36.0)
MCV: 89.1 fL (ref 78.0–100.0)
Monocytes Absolute: 0.5 10*3/uL (ref 0.1–1.0)
Monocytes Relative: 6 %
NEUTROS PCT: 73 %
Neutro Abs: 6.1 10*3/uL (ref 1.7–7.7)
PLATELETS: 305 10*3/uL (ref 150–400)
RBC: 4.48 MIL/uL (ref 3.87–5.11)
RDW: 13.3 % (ref 11.5–15.5)
WBC: 8.3 10*3/uL (ref 4.0–10.5)

## 2016-12-19 LAB — COMPREHENSIVE METABOLIC PANEL
ALT: 15 U/L (ref 14–54)
AST: 21 U/L (ref 15–41)
Albumin: 3.8 g/dL (ref 3.5–5.0)
Alkaline Phosphatase: 108 U/L (ref 38–126)
Anion gap: 10 (ref 5–15)
BUN: 27 mg/dL — AB (ref 6–20)
CHLORIDE: 98 mmol/L — AB (ref 101–111)
CO2: 28 mmol/L (ref 22–32)
CREATININE: 0.94 mg/dL (ref 0.44–1.00)
Calcium: 9.7 mg/dL (ref 8.9–10.3)
GFR calc non Af Amer: 58 mL/min — ABNORMAL LOW (ref >60)
Glucose, Bld: 96 mg/dL (ref 65–99)
POTASSIUM: 4.3 mmol/L (ref 3.5–5.1)
SODIUM: 136 mmol/L (ref 135–145)
Total Bilirubin: 0.7 mg/dL (ref 0.3–1.2)
Total Protein: 6.9 g/dL (ref 6.5–8.1)

## 2016-12-19 LAB — LIPASE, BLOOD: Lipase: 45 U/L (ref 11–51)

## 2016-12-19 LAB — LACTIC ACID, PLASMA
LACTIC ACID, VENOUS: 1 mmol/L (ref 0.5–1.9)
Lactic Acid, Venous: 1.3 mmol/L (ref 0.5–1.9)

## 2016-12-19 LAB — TROPONIN I

## 2016-12-19 MED ORDER — SODIUM CHLORIDE 0.9 % IV SOLN
INTRAVENOUS | Status: DC
Start: 2016-12-19 — End: 2016-12-19
  Administered 2016-12-19: 11:00:00 via INTRAVENOUS

## 2016-12-19 MED ORDER — SODIUM CHLORIDE 0.9 % IV BOLUS (SEPSIS): 500.0000 mL | Freq: Once | INTRAVENOUS | Status: DC

## 2016-12-19 NOTE — ED Notes (Signed)
Pt given sprite to drink and a pack of crackers.

## 2016-12-19 NOTE — ED Notes (Signed)
Unable to get orthostatic blood pressures at this time. When taking BP pt gets extremely agitated and begins to shake and cry. Erroneous elevated blood pressures are a result.

## 2016-12-19 NOTE — Discharge Instructions (Signed)
Take your usual prescriptions as previously directed.  Increase your fluid intake (ie:  Pedialyte, Gatoraide) for the next few days.  Eat a bland diet and advance to your regular diet slowly as you can tolerate it.   Avoid full strength juices, as well as milk and milk products until your diarrhea has resolved.   Call your regular medical doctor today to schedule a follow up appointment in the next 2 days.  Return to the Emergency Department immediately sooner if you have any black or bloody stool or vomit, if you develop a fever over "101," or for any other concerns.

## 2016-12-19 NOTE — ED Triage Notes (Signed)
Pt's daughter-in-law, caregiver, c/o pt has not been eating, having diarrhea, generalized weakness, not taking medications since Wednesday. Daughter-in-law reports hx of dementia.

## 2016-12-19 NOTE — ED Notes (Signed)
Pt gone to XRAY

## 2016-12-19 NOTE — ED Provider Notes (Signed)
[facility] EMERGENCY DEPARTMENT Provider Note   CSN: [id number] Arrival date & time: 12/19/16  0175     History   Chief Complaint Chief Complaint  Patient presents with  . Weakness    HPI [name] is a 76 y.o. female.  The history is provided by a caregiver and the patient. The history is limited by the condition of the patient (Hx dementia).  Weakness     Pt was seen at 1055. Per pt's family:  Pt with gradual onset and persistence of constant generalized weakness for the past 1 week. Has been associated with decreased PO intake. Has been associated with "some" diarrhea. Pt's family is concerned pt is "getting dehydrated."  Pt herself has hx of dementia and currently denies any complaints. Denies N/V, no fevers, no CP/SOB, no cough, no abd pain, no black or blood in stools.   Past Medical History:  Diagnosis Date  . Anxiety   . Aortic stenosis    Status post bioprosthetic AVR 2004  . Arthritis   . COPD with chronic bronchitis ([place])   . Dementia   . Depression   . Essential hypertension   . History of hyperthyroidism 2012   Status post ablation  . Hyperlipidemia   . Osteoporosis   . Renal insufficiency   . Rheumatic fever   . Sebaceous cyst   . Urosepsis 2012   Recurrent    Patient Active Problem List   Diagnosis Date Noted  . Osteoarthritis of both shoulders 09/23/2016  . Supplemental oxygen dependent 07/02/2016  . Reduced vision 04/29/2015  . Allergic rhinitis 12/29/2014  . GERD (gastroesophageal reflux disease) 10/18/2014  . Protein-calorie malnutrition, severe ([place]) 10/29/2013  . Cerebellar stroke ([place]) 10/29/2013  . Fall at home 10/28/2013  . Dementia without behavioral disturbance 09/08/2012  . Underweight 07/05/2011  . Lung nodule 02/16/2010  . IMPAIRED FASTING GLUCOSE 07/12/2009  . Depression with anxiety 09/09/2008  . AORTIC VALVE DISEASE 08/10/2008  . Insomnia secondary to depression with anxiety 07/27/2008  . OTHER OSTEOPOROSIS 06/09/2008   . UNSPECIFIED DISORDER OF THYROID 06/20/2007  . Essential hypertension, benign 03/15/2007  . Diastolic CHF, chronic ([place]) 03/15/2007  . COPD GOLD III with reversibility  03/15/2007    Past Surgical History:  Procedure Laterality Date  . AORTIC VALVE REPLACEMENT     68mm porcine 10/04  . APPENDECTOMY    . BREAST LUMPECTOMY    . CARDIAC VALVE REPLACEMENT    . CHOLECYSTECTOMY    . COLON SURGERY  2005  . COLONOSCOPY  07/23/2008    Single diminutive polyp in the rectum status post cold biopsy removal/ Status post right hemicolectomy/ Colonic ulcers in the upstream end the colon distal to the  anastomosis as described above.  Status post biopsy. The anastomosis itself and neoterminal ileum appeared normal  . ESOPHAGOGASTRODUODENOSCOPY  10/12/09   short segment Barrett's esophage/small hiatal hernia,antral ersions  . LUMBAR LAMINECTOMY    . TONSILLECTOMY      OB History    No data available       Home Medications    Prior to Admission medications   Medication Sig Start Date End Date Taking? Authorizing Provider  albuterol (PROVENTIL) (2.5 MG/3ML) 0.083% nebulizer solution TAKE 3 MLS (2.5 MG TOTAL) BY NEBULIZATION 3 (THREE) TIMES DAILY. 09/19/16  Yes Fayrene Helper, MD  aspirin 81 MG tablet Take 1 tablet (81 mg total) by mouth daily. 11/01/13  Yes Kathie Dike, MD  donepezil (ARICEPT) 10 MG tablet TAKE 1 TABLET BY  MOUTH AT BEDTIME. 12/06/16  Yes Fayrene Helper, MD  famotidine (PEPCID) 20 MG tablet Take 20 mg by mouth at bedtime.   Yes [provider]  lansoprazole (PREVACID) 15 MG capsule Take twocapsules 30 minutes before food 06/29/16  Yes Fayrene Helper, MD  losartan (COZAAR) 25 MG tablet Take 0.5 tablets (12.5 mg total) by mouth daily. 10/12/16  Yes Satira Sark, MD  mirtazapine (REMERON) 7.5 MG tablet TAKE 1 TABLET BY MOUTH AT BEDTIME 11/15/16  Yes Fayrene Helper, MD  sertraline (ZOLOFT) 25 MG tablet Take 1 tablet (25 mg total) by mouth daily.  06/29/16  Yes Fayrene Helper, MD    Family History Family History  Problem Relation Age of Onset  . Heart attack Mother 107  . Stroke Father 30  . Hypertension Sister   . Depression Sister   . Alcohol abuse Sister   . Diabetes Son     Social History Social History  Substance Use Topics  . Smoking status: Former Smoker    Packs/day: 0.25    Years: 50.00    Types: Cigarettes    Quit date: 04/30/2012  . Smokeless tobacco: Never Used  . Alcohol use No     Allergies   Levofloxacin; Nitrofurantoin; Bupropion hcl; Cyclobenzaprine hcl; Gadolinium; Lorazepam; Penicillins; Statins; and Sulfonamide derivatives   Review of Systems Review of Systems  Unable to perform ROS: Dementia  Neurological: Positive for weakness.     Physical Exam Updated Vital Signs BP 131/65   Pulse 93   Temp 97.9 F (36.6 C) (Oral)   Resp (!) 22   Wt 42.2 kg (93 lb)   SpO2 99%   BMI 16.47 kg/m    Physical Exam 1100: Physical examination:  Nursing notes reviewed; Vital signs and O2 SAT reviewed;  Constitutional: Well developed, Well nourished, Well hydrated, In no acute distress; Head:  Normocephalic, atraumatic; Eyes: EOMI, PERRL, No scleral icterus; ENMT: Mouth and pharynx normal, Mucous membranes moist; Neck: Supple, Full range of motion, No lymphadenopathy; Cardiovascular: Regular rate and rhythm, No gallop; Respiratory: Breath sounds clear & equal bilaterally, No wheezes.  Speaking full sentences with ease, Normal respiratory effort/excursion; Chest: Nontender, Movement normal; Abdomen: Soft, Nontender, Nondistended, Normal bowel sounds; Genitourinary: No CVA tenderness; Extremities: Pulses normal, No tenderness, No edema, No calf edema or asymmetry.; Neuro: Awake, alert, confused per hx of dementia.  Major CN grossly intact. No facial droop. Speech clear. Grips equal. Strength 5/5 equal bilat UE's and LE's. Moves all extremities spontaneously and to command without apparent gross focal motor  deficits.; Skin: Color normal, Warm, Dry.   ED Treatments / Results  Labs (all labs ordered are listed, but only abnormal results are displayed)   EKG  EKG Interpretation  Date/Time:  Tuesday December 19 2016 11:08:50 EDT Ventricular Rate:  85 PR Interval:    QRS Duration: 87 QT Interval:  388 QTC Calculation: 462 R Axis:   65 Text Interpretation:  Age not entered, assumed to be  76 years old for purpose of ECG interpretation Sinus rhythm Atrial premature complex Probable anteroseptal infarct, recent Baseline wander When compared with ECG of 10/10/2016 No significant change was found Confirmed by Francine Graven [phone]) on 12/19/2016 11:14:38 AM       Radiology   Procedures Procedures (including critical care time)  Medications Ordered in ED Medications  0.9 %  sodium chloride infusion ( Intravenous New Bag/Given 12/19/16 1119)  sodium chloride 0.9 % bolus 500 mL (not administered)     Initial  Impression / Assessment and Plan / ED Course  I have reviewed the triage vital signs and the nursing notes.  Pertinent labs & imaging results that were available during my care of the patient were reviewed by me and considered in my medical decision making (see chart for details).   MDM Reviewed: previous chart, nursing note and vitals Reviewed previous: labs and ECG Interpretation: labs, ECG, x-ray and CT scan   Results for orders placed or performed during the hospital encounter of 12/19/16  Urinalysis, Routine w reflex microscopic  Result Value Ref Range   Color, Urine YELLOW YELLOW   APPearance CLEAR CLEAR   Specific Gravity, Urine 1.008 1.005 - 1.030   pH 5.0 5.0 - 8.0   Glucose, UA NEGATIVE NEGATIVE mg/dL   Hgb urine dipstick MODERATE (A) NEGATIVE   Bilirubin Urine NEGATIVE NEGATIVE   Ketones, ur 5 (A) NEGATIVE mg/dL   Protein, ur NEGATIVE NEGATIVE mg/dL   Nitrite NEGATIVE NEGATIVE   Leukocytes, UA NEGATIVE NEGATIVE   RBC / HPF 0-5 0 - 5 RBC/hpf   WBC, UA 0-5  0 - 5 WBC/hpf   Bacteria, UA NONE SEEN NONE SEEN   Squamous Epithelial / LPF 0-5 (A) NONE SEEN  Comprehensive metabolic panel  Result Value Ref Range   Sodium 136 135 - 145 mmol/L   Potassium 4.3 3.5 - 5.1 mmol/L   Chloride 98 (L) 101 - 111 mmol/L   CO2 28 22 - 32 mmol/L   Glucose, Bld 96 65 - 99 mg/dL   BUN 27 (H) 6 - 20 mg/dL   Creatinine, Ser 0.94 0.44 - 1.00 mg/dL   Calcium 9.7 8.9 - 10.3 mg/dL   Total Protein 6.9 6.5 - 8.1 g/dL   Albumin 3.8 3.5 - 5.0 g/dL   AST 21 15 - 41 U/L   ALT 15 14 - 54 U/L   Alkaline Phosphatase 108 38 - 126 U/L   Total Bilirubin 0.7 0.3 - 1.2 mg/dL   GFR calc non Af Amer 58 (L) >60 mL/min   GFR calc Af Amer >60 >60 mL/min   Anion gap 10 5 - 15  Lipase, blood  Result Value Ref Range   Lipase 45 11 - 51 U/L  Troponin I  Result Value Ref Range   Troponin I <0.03 <0.03 ng/mL  Lactic acid, plasma  Result Value Ref Range   Lactic Acid, Venous 1.3 0.5 - 1.9 mmol/L  Lactic acid, plasma  Result Value Ref Range   Lactic Acid, Venous 1.0 0.5 - 1.9 mmol/L  CBC with Differential  Result Value Ref Range   WBC 8.3 4.0 - 10.5 K/uL   RBC 4.48 3.87 - 5.11 MIL/uL   Hemoglobin 13.2 12.0 - 15.0 g/dL   HCT 39.9 36.0 - 46.0 %   MCV 89.1 78.0 - 100.0 fL   MCH 29.5 26.0 - 34.0 pg   MCHC 33.1 30.0 - 36.0 g/dL   RDW 13.3 11.5 - 15.5 %   Platelets 305 150 - 400 K/uL   Neutrophils Relative % 73 %   Neutro Abs 6.1 1.7 - 7.7 K/uL   Lymphocytes Relative 20 %   Lymphs Abs 1.6 0.7 - 4.0 K/uL   Monocytes Relative 6 %   Monocytes Absolute 0.5 0.1 - 1.0 K/uL   Eosinophils Relative 1 %   Eosinophils Absolute 0.1 0.0 - 0.7 K/uL   Basophils Relative 0 %   Basophils Absolute 0.0 0.0 - 0.1 K/uL   Dg Abd Acute W/chest Result Date: 12/19/2016  CLINICAL DATA:  Decreased appetite.  Generalized weakness. EXAM: DG ABDOMEN ACUTE W/ 1V CHEST COMPARISON:  10/10/2016 FINDINGS: Prior CABG. There is hyperinflation of the lungs compatible with COPD. Gas throughout mildly prominent  large and small bowel loops, nonspecific. No organomegaly. No free air. Small calcification projects over the lower pole of the right kidney which could reflect a small right renal stone. IMPRESSION: Nonspecific bowel gas pattern with mildly prominent large and small bowel loops. Question right nephrolithiasis. COPD.  No active cardiopulmonary disease. Electronically Signed   By: Rolm Baptise M.D.   On: 12/19/2016 11:49   Ct Renal Stone Study Result Date: 12/19/2016 CLINICAL DATA:  Abdominal pain and diarrhea.  Loss of appetite EXAM: CT ABDOMEN AND PELVIS WITHOUT CONTRAST TECHNIQUE: Multidetector CT imaging of the abdomen and pelvis was performed following the standard protocol without oral or intravenous contrast material administration. COMPARISON:  August 02, 2010 FINDINGS: Lower chest: There is mild bibasilar lung scarring. No lung base edema or consolidation. There are foci of coronary artery calcification. Hepatobiliary: There is fatty infiltration near the fissure for the ligamentum teres. No focal liver lesions are appreciable on this noncontrast enhanced study. Gallbladder is absent. There is no appreciable biliary duct dilatation. Pancreas: No pancreatic mass or inflammatory focus. Spleen: No splenic lesions are evident. Adrenals/Urinary Tract: Adrenals appear normal bilaterally. There is no appreciable mass or hydronephrosis on either side. There is a 3 mm calculus with a nearby 2 mm calculus in the lower pole of the right kidney. There is no ureteral calculus on either side. Urinary bladder is midline with wall thickness within normal limits. Stomach/Bowel: The rectum is diffusely distended with air. There is no appreciable rectal wall thickening or perirectal stranding. There are sigmoid diverticula without diverticulitis. Postoperative changes noted in the ascending colon region with patent anastomosis. There is no appreciable bowel wall or mesenteric thickening. No evident bowel obstruction. No free  air or portal venous air. Vascular/Lymphatic: There is extensive atherosclerotic calcification in the aorta and iliac arteries. No aneurysm evident. Major mesenteric vessels appear patent on this noncontrast enhanced study. There is no demonstrable adenopathy in the abdomen or pelvis. Reproductive: Uterus is anteverted. There are multiple small calcifications in the uterus, likely foci of leiomyomatous change. No pelvic mass identified outside of the uterus. Other: Appendix is absent. There is no new ascites or abscess in the abdomen or pelvis. Musculoskeletal: There are no blastic or lytic bone lesions. There are a few scattered presumed bone islands in the pelvis. Bones appear osteoporotic. No intramuscular or abdominal wall lesion evident. IMPRESSION: 1. Rectum diffusely distended with air. No rectal wall thickening. Elsewhere no bowel wall thickening or bowel obstruction. No free air or portal venous air. Postoperative change in ascending colon region with anastomosis patent. There are sigmoid diverticula without diverticulitis. 2. Small nonobstructing calculi lower pole right kidney. No hydronephrosis or ureteral calculus on either side. 3.  Gallbladder absent.  Appendix absent. 4. Small calcifications in uterus, likely due to leiomyomatous change. 5. Extensive aortoiliac atherosclerosis. There is also calcification in several coronary artery regions. Aortic Atherosclerosis (ICD10-I70.0). Electronically Signed   By: Lowella Grip III M.D.   On: 12/19/2016 13:56    1445:  Pt has tol food and fluids PO well while in the ED without N/V. No stooling while in the ED. Abd remains benign, VSS. Workup reassuring. No clear indication for admission at this time. Family requests the entire IV 1L NS be infused and then they would like to take her  home. Dx and testing d/w pt and family.  Questions answered.  Verb understanding, agreeable to d/c home with outpt f/u.   Final Clinical Impressions(s) / ED Diagnoses    Final diagnoses:  None    New Prescriptions New Prescriptions   No medications on file      Francine Graven, DO 12/24/16 1252

## 2016-12-20 ENCOUNTER — Telehealth: Payer: Self-pay | Admitting: *Deleted

## 2016-12-20 NOTE — Telephone Encounter (Signed)
Carmell Austria came in the office today stating she is patient's care giver and she has multiple concerns she would like to discuss with the nurse. Please call her back [phone]. Carmell Austria requested a call back today.

## 2016-12-21 ENCOUNTER — Other Ambulatory Visit: Payer: Self-pay

## 2016-12-21 LAB — URINE CULTURE

## 2016-12-21 NOTE — Telephone Encounter (Signed)
Spoke with [name] and she is wanting her back on the spiriva but couldn't afford it. (states she puffs on the rescue inhaler too much if not supervised and the spiriva is a one time capsule that works great for her. Will attempt PA.   Also she wants to know she she is barely drinking if they can get standing orders to go get IV fluids when she gets dehydrated

## 2016-12-21 NOTE — Telephone Encounter (Signed)
oK  Spiriva, possibility of in home IVF unlikely as very poor access also has heart failure history so will hold on that

## 2016-12-25 NOTE — Telephone Encounter (Signed)
spiriva was approved by insurance

## 2017-01-08 ENCOUNTER — Ambulatory Visit (INDEPENDENT_AMBULATORY_CARE_PROVIDER_SITE_OTHER): Payer: Medicare Other | Admitting: Family Medicine

## 2017-01-08 VITALS — BP 136/72 | HR 95 | Temp 98.5°F | Resp 16 | Ht 63.0 in | Wt 98.2 lb

## 2017-01-08 DIAGNOSIS — I639 Cerebral infarction, unspecified: Secondary | ICD-10-CM

## 2017-01-08 DIAGNOSIS — F418 Other specified anxiety disorders: Secondary | ICD-10-CM | POA: Diagnosis not present

## 2017-01-08 DIAGNOSIS — I5032 Chronic diastolic (congestive) heart failure: Secondary | ICD-10-CM

## 2017-01-08 DIAGNOSIS — G300 Alzheimer's disease with early onset: Secondary | ICD-10-CM | POA: Diagnosis not present

## 2017-01-08 DIAGNOSIS — R636 Underweight: Secondary | ICD-10-CM | POA: Diagnosis not present

## 2017-01-08 DIAGNOSIS — F0280 Dementia in other diseases classified elsewhere without behavioral disturbance: Secondary | ICD-10-CM

## 2017-01-08 DIAGNOSIS — F5105 Insomnia due to other mental disorder: Secondary | ICD-10-CM

## 2017-01-08 NOTE — Progress Notes (Signed)
   [name]     MRN: [id number]      DOB: 10/05/1940   HPI [name] is here for follow up with ehr daughter in law. [facility]  Despite the fact that that she feels weaker , an dhas had 2 episdoes of non resposivenesscalling 911 in the past 3 weeks approximately ROS Denies recent fever or chills. Denies sinus pressure, nasal congestion, ear pain or sore throat. Denies chest congestion, productive cough or wheezing. Denies chest pains, palpitations and leg swelling Denies abdominal pain, nausea, vomiting,diarrhea or constipation.   Denies dysuria, frequency, hesitancy or incontinence. Denies  Uncontrolled joint pain, swelling and does have  limitation in mobility. Denies headaches, seizures, numbness, or tingling. Denies depression, anxiety or insomnia. Denies skin break down or rash.   PE  BP 136/72 (BP Location: Left Arm, Patient Position: Sitting, Cuff Size: Normal)   Pulse 95   Temp 98.5 F (36.9 C) (Other (Comment))   Resp 16   Ht 5\' 3"  (1.6 m)   Wt 98 lb 4 oz (44.6 kg)   SpO2 95%   BMI 17.40 kg/m   Patient arousable , but more sedated than usual for the initial half of the visi and in no cardiopulmonary distress.  HEENT: No facial asymmetry, EOMI,   oropharynx pink and moist.  Neck adequate though reduced ROM, no JVD, no mass.  Chest: Clear to auscultation bilaterally.Decreased air entry  CVS: S1, S2 systolic murmur, no S3.Regular rate.  ABD: Soft non tender.   Ext: No edema  MS: Adequate though reduced  ROM spine, shoulders, hips and knees.  Skin: Intact, no ulcerations or rash noted.  Psych: Good eye contact, flat affect. Memory impaired not anxious or   depressed appearing.  CNS: CN 2-12 intact, power,  normal throughout.no focal deficits noted.   Assessment & Plan  Dementia without behavioral disturbance Gradual deterioration in overall status is noted by family. Has had several periods of  Unresponsiveness at the daycare  facility, however , after cardiology evaluation , no further testing or action deemed appropriate. Caregiver , her daughter in law is comfortable with this States as long as the facility is willing to have her attend during the day, she is comfortable with current care. Palliative services was briefly discussed, stated she knows nothing about this and would look into it further , but at this time , no real push to change any current management  Depression with anxiety Controlled, no change in medication   Diastolic CHF, chronic Stable , no current decompensation  Insomnia secondary to depression with anxiety Continue current medication regime as pt gets appropriate sleep on this regime  Underweight Patient encouraged to increase oral intake to ensure weight gain  Cerebellar stroke Increased fall risk, home safety reviewed with caregiver and patient

## 2017-01-08 NOTE — Patient Instructions (Signed)
F/u in 5 months, call if you need me before  Be careful not to fall  No changes in nmedication

## 2017-01-13 ENCOUNTER — Encounter: Payer: Self-pay | Admitting: Family Medicine

## 2017-01-13 NOTE — Assessment & Plan Note (Signed)
Increased fall risk, home safety reviewed with caregiver and patient

## 2017-01-13 NOTE — Assessment & Plan Note (Signed)
Patient encouraged to increase oral intake to ensure weight gain

## 2017-01-13 NOTE — Assessment & Plan Note (Signed)
Stable , no current decompensation

## 2017-01-13 NOTE — Assessment & Plan Note (Signed)
Controlled, no change in medication  

## 2017-01-13 NOTE — Assessment & Plan Note (Signed)
Gradual deterioration in overall status is noted by family. Has had several periods of  Unresponsiveness at the daycare facility, however , after cardiology evaluation , no further testing or action deemed appropriate. Caregiver , her daughter in law is comfortable with this States as long as the facility is willing to have her attend during the day, she is comfortable with current care. Palliative services was briefly discussed, stated she knows nothing about this and would look into it further , but at this time , no real push to change any current management

## 2017-01-13 NOTE — Assessment & Plan Note (Signed)
Continue current medication regime as pt gets appropriate sleep on this regime

## 2017-01-15 ENCOUNTER — Telehealth: Payer: Self-pay | Admitting: Family Medicine

## 2017-01-15 NOTE — Telephone Encounter (Signed)
noted 

## 2017-01-15 NOTE — Telephone Encounter (Signed)
Olean Ree, [facility], left message on nurse line regarding patient. She states she received a referral for hospice care for patient from Pollie Meyer at the [facility]. Olean Ree is calling for a copy of a history and physical, or an office visit note, something with a physical examination on it to document hospice necessity.   Callback# [phone] Fax# [phone]

## 2017-01-15 NOTE — Telephone Encounter (Signed)
Info faxed

## 2017-01-15 NOTE — Telephone Encounter (Signed)
Cassandra from [facility] called left message stating she needs patient's h&p last 2 ov notes and lab results. Please call [phone] and fax number [phone]

## 2017-02-22 ENCOUNTER — Telehealth: Payer: Self-pay | Admitting: Family Medicine

## 2017-02-22 MED ORDER — UNABLE TO FIND: 0 refills | Status: AC

## 2017-02-22 NOTE — Telephone Encounter (Signed)
Will fax to [place]

## 2017-02-22 NOTE — Telephone Encounter (Signed)
Carmell Austria, daughter-in-law and caregiver, left message on nurse line regarding patient. She states [place] needs a script for nebulizer tubing kit with mask. She states she thinks patient will need a child size mask, so if you could write the script for both a child and adult mask, they can choose what they need when they get there.   Callback# [phone]

## 2017-03-08 ENCOUNTER — Other Ambulatory Visit: Payer: Self-pay

## 2017-03-08 MED ORDER — SERTRALINE HCL 25 MG PO TABS: 25.0000 mg | ORAL_TABLET | Freq: Every day | ORAL | 1 refills | Status: DC

## 2017-03-20 ENCOUNTER — Other Ambulatory Visit: Payer: Self-pay | Admitting: Family Medicine

## 2017-04-06 DIAGNOSIS — M79675 Pain in left toe(s): Secondary | ICD-10-CM | POA: Diagnosis not present

## 2017-04-06 DIAGNOSIS — I739 Peripheral vascular disease, unspecified: Secondary | ICD-10-CM | POA: Diagnosis not present

## 2017-04-06 DIAGNOSIS — M2012 Hallux valgus (acquired), left foot: Secondary | ICD-10-CM | POA: Diagnosis not present

## 2017-04-06 DIAGNOSIS — B351 Tinea unguium: Secondary | ICD-10-CM | POA: Diagnosis not present

## 2017-05-07 ENCOUNTER — Telehealth: Payer: Self-pay | Admitting: Family Medicine

## 2017-05-07 NOTE — Telephone Encounter (Signed)
Someone, a female that did not leave her name, left message on nurse line stating she thinks patient is in her 'later days.' She states that patient is not getting up, eating or drinking. She is asking if there is a medication that can be called in to ease the transition. She states that the patient is moaning a lot.   Callback# [phone]

## 2017-05-07 NOTE — Telephone Encounter (Signed)
Does she need hospice referral?

## 2017-05-08 ENCOUNTER — Telehealth: Payer: Self-pay | Admitting: Family Medicine

## 2017-05-08 NOTE — Telephone Encounter (Signed)
[name] is calling and states that Hospice can come out for re-evalutation.

## 2017-05-08 NOTE — Telephone Encounter (Signed)
Main issue now is care giving, Daughter in law waiting on what other family members are willing to do to help, she will call back and let us know if hospice is wanted

## 2017-05-08 NOTE — Telephone Encounter (Signed)
3 week h/o patient taking in very little,  Except when her family came to see her Caregiver states she is burnt out

## 2017-05-08 NOTE — Telephone Encounter (Signed)
pls csall back and identify caller and then I can daddres if person is on her health record

## 2017-05-08 NOTE — Telephone Encounter (Signed)
It is [name]- her Public librarian. She is on her DPR.

## 2017-05-09 NOTE — Telephone Encounter (Signed)
Referral sent 

## 2017-05-10 DIAGNOSIS — I5032 Chronic diastolic (congestive) heart failure: Secondary | ICD-10-CM | POA: Diagnosis not present

## 2017-05-10 DIAGNOSIS — F419 Anxiety disorder, unspecified: Secondary | ICD-10-CM | POA: Diagnosis not present

## 2017-05-10 DIAGNOSIS — R64 Cachexia: Secondary | ICD-10-CM | POA: Diagnosis not present

## 2017-05-10 DIAGNOSIS — F0390 Unspecified dementia without behavioral disturbance: Secondary | ICD-10-CM | POA: Diagnosis not present

## 2017-05-10 DIAGNOSIS — J449 Chronic obstructive pulmonary disease, unspecified: Secondary | ICD-10-CM | POA: Diagnosis not present

## 2017-05-11 DIAGNOSIS — F419 Anxiety disorder, unspecified: Secondary | ICD-10-CM | POA: Diagnosis not present

## 2017-05-11 DIAGNOSIS — F0390 Unspecified dementia without behavioral disturbance: Secondary | ICD-10-CM | POA: Diagnosis not present

## 2017-05-11 DIAGNOSIS — J449 Chronic obstructive pulmonary disease, unspecified: Secondary | ICD-10-CM | POA: Diagnosis not present

## 2017-05-11 DIAGNOSIS — R64 Cachexia: Secondary | ICD-10-CM | POA: Diagnosis not present

## 2017-05-11 DIAGNOSIS — I5032 Chronic diastolic (congestive) heart failure: Secondary | ICD-10-CM | POA: Diagnosis not present

## 2017-05-16 DIAGNOSIS — J449 Chronic obstructive pulmonary disease, unspecified: Secondary | ICD-10-CM | POA: Diagnosis not present

## 2017-05-16 DIAGNOSIS — I5032 Chronic diastolic (congestive) heart failure: Secondary | ICD-10-CM | POA: Diagnosis not present

## 2017-05-16 DIAGNOSIS — F419 Anxiety disorder, unspecified: Secondary | ICD-10-CM | POA: Diagnosis not present

## 2017-05-16 DIAGNOSIS — R64 Cachexia: Secondary | ICD-10-CM | POA: Diagnosis not present

## 2017-05-16 DIAGNOSIS — F0390 Unspecified dementia without behavioral disturbance: Secondary | ICD-10-CM | POA: Diagnosis not present

## 2017-05-21 DIAGNOSIS — J449 Chronic obstructive pulmonary disease, unspecified: Secondary | ICD-10-CM | POA: Diagnosis not present

## 2017-05-21 DIAGNOSIS — F419 Anxiety disorder, unspecified: Secondary | ICD-10-CM | POA: Diagnosis not present

## 2017-05-21 DIAGNOSIS — R64 Cachexia: Secondary | ICD-10-CM | POA: Diagnosis not present

## 2017-05-21 DIAGNOSIS — F0390 Unspecified dementia without behavioral disturbance: Secondary | ICD-10-CM | POA: Diagnosis not present

## 2017-05-21 DIAGNOSIS — I5032 Chronic diastolic (congestive) heart failure: Secondary | ICD-10-CM | POA: Diagnosis not present

## 2017-05-24 DIAGNOSIS — R64 Cachexia: Secondary | ICD-10-CM | POA: Diagnosis not present

## 2017-05-24 DIAGNOSIS — I5032 Chronic diastolic (congestive) heart failure: Secondary | ICD-10-CM | POA: Diagnosis not present

## 2017-05-24 DIAGNOSIS — F419 Anxiety disorder, unspecified: Secondary | ICD-10-CM | POA: Diagnosis not present

## 2017-05-24 DIAGNOSIS — J449 Chronic obstructive pulmonary disease, unspecified: Secondary | ICD-10-CM | POA: Diagnosis not present

## 2017-05-24 DIAGNOSIS — F0390 Unspecified dementia without behavioral disturbance: Secondary | ICD-10-CM | POA: Diagnosis not present

## 2017-05-28 DIAGNOSIS — I5032 Chronic diastolic (congestive) heart failure: Secondary | ICD-10-CM | POA: Diagnosis not present

## 2017-05-28 DIAGNOSIS — R64 Cachexia: Secondary | ICD-10-CM | POA: Diagnosis not present

## 2017-05-28 DIAGNOSIS — J449 Chronic obstructive pulmonary disease, unspecified: Secondary | ICD-10-CM | POA: Diagnosis not present

## 2017-05-28 DIAGNOSIS — F0390 Unspecified dementia without behavioral disturbance: Secondary | ICD-10-CM | POA: Diagnosis not present

## 2017-05-28 DIAGNOSIS — F419 Anxiety disorder, unspecified: Secondary | ICD-10-CM | POA: Diagnosis not present

## 2017-05-30 DIAGNOSIS — J449 Chronic obstructive pulmonary disease, unspecified: Secondary | ICD-10-CM | POA: Diagnosis not present

## 2017-05-30 DIAGNOSIS — I5032 Chronic diastolic (congestive) heart failure: Secondary | ICD-10-CM | POA: Diagnosis not present

## 2017-05-30 DIAGNOSIS — F419 Anxiety disorder, unspecified: Secondary | ICD-10-CM | POA: Diagnosis not present

## 2017-05-30 DIAGNOSIS — F0390 Unspecified dementia without behavioral disturbance: Secondary | ICD-10-CM | POA: Diagnosis not present

## 2017-05-30 DIAGNOSIS — R64 Cachexia: Secondary | ICD-10-CM | POA: Diagnosis not present

## 2017-06-04 DIAGNOSIS — J449 Chronic obstructive pulmonary disease, unspecified: Secondary | ICD-10-CM | POA: Diagnosis not present

## 2017-06-04 DIAGNOSIS — I5032 Chronic diastolic (congestive) heart failure: Secondary | ICD-10-CM | POA: Diagnosis not present

## 2017-06-04 DIAGNOSIS — F0390 Unspecified dementia without behavioral disturbance: Secondary | ICD-10-CM | POA: Diagnosis not present

## 2017-06-04 DIAGNOSIS — F419 Anxiety disorder, unspecified: Secondary | ICD-10-CM | POA: Diagnosis not present

## 2017-06-04 DIAGNOSIS — R64 Cachexia: Secondary | ICD-10-CM | POA: Diagnosis not present

## 2017-06-05 DIAGNOSIS — F419 Anxiety disorder, unspecified: Secondary | ICD-10-CM | POA: Diagnosis not present

## 2017-06-05 DIAGNOSIS — J449 Chronic obstructive pulmonary disease, unspecified: Secondary | ICD-10-CM | POA: Diagnosis not present

## 2017-06-05 DIAGNOSIS — R64 Cachexia: Secondary | ICD-10-CM | POA: Diagnosis not present

## 2017-06-05 DIAGNOSIS — I5032 Chronic diastolic (congestive) heart failure: Secondary | ICD-10-CM | POA: Diagnosis not present

## 2017-06-05 DIAGNOSIS — F0390 Unspecified dementia without behavioral disturbance: Secondary | ICD-10-CM | POA: Diagnosis not present

## 2017-06-06 DIAGNOSIS — F419 Anxiety disorder, unspecified: Secondary | ICD-10-CM | POA: Diagnosis not present

## 2017-06-06 DIAGNOSIS — J449 Chronic obstructive pulmonary disease, unspecified: Secondary | ICD-10-CM | POA: Diagnosis not present

## 2017-06-06 DIAGNOSIS — F0390 Unspecified dementia without behavioral disturbance: Secondary | ICD-10-CM | POA: Diagnosis not present

## 2017-06-06 DIAGNOSIS — I5032 Chronic diastolic (congestive) heart failure: Secondary | ICD-10-CM | POA: Diagnosis not present

## 2017-06-06 DIAGNOSIS — R64 Cachexia: Secondary | ICD-10-CM | POA: Diagnosis not present

## 2017-06-11 DIAGNOSIS — F419 Anxiety disorder, unspecified: Secondary | ICD-10-CM | POA: Diagnosis not present

## 2017-06-11 DIAGNOSIS — R64 Cachexia: Secondary | ICD-10-CM | POA: Diagnosis not present

## 2017-06-11 DIAGNOSIS — J449 Chronic obstructive pulmonary disease, unspecified: Secondary | ICD-10-CM | POA: Diagnosis not present

## 2017-06-11 DIAGNOSIS — F0390 Unspecified dementia without behavioral disturbance: Secondary | ICD-10-CM | POA: Diagnosis not present

## 2017-06-11 DIAGNOSIS — I5032 Chronic diastolic (congestive) heart failure: Secondary | ICD-10-CM | POA: Diagnosis not present

## 2017-06-12 DIAGNOSIS — F0390 Unspecified dementia without behavioral disturbance: Secondary | ICD-10-CM | POA: Diagnosis not present

## 2017-06-12 DIAGNOSIS — J449 Chronic obstructive pulmonary disease, unspecified: Secondary | ICD-10-CM | POA: Diagnosis not present

## 2017-06-12 DIAGNOSIS — F419 Anxiety disorder, unspecified: Secondary | ICD-10-CM | POA: Diagnosis not present

## 2017-06-12 DIAGNOSIS — R64 Cachexia: Secondary | ICD-10-CM | POA: Diagnosis not present

## 2017-06-12 DIAGNOSIS — I5032 Chronic diastolic (congestive) heart failure: Secondary | ICD-10-CM | POA: Diagnosis not present

## 2017-06-13 DIAGNOSIS — J449 Chronic obstructive pulmonary disease, unspecified: Secondary | ICD-10-CM | POA: Diagnosis not present

## 2017-06-13 DIAGNOSIS — F419 Anxiety disorder, unspecified: Secondary | ICD-10-CM | POA: Diagnosis not present

## 2017-06-13 DIAGNOSIS — R64 Cachexia: Secondary | ICD-10-CM | POA: Diagnosis not present

## 2017-06-13 DIAGNOSIS — I5032 Chronic diastolic (congestive) heart failure: Secondary | ICD-10-CM | POA: Diagnosis not present

## 2017-06-13 DIAGNOSIS — F0390 Unspecified dementia without behavioral disturbance: Secondary | ICD-10-CM | POA: Diagnosis not present

## 2017-06-14 ENCOUNTER — Other Ambulatory Visit: Payer: Self-pay | Admitting: Family Medicine

## 2017-06-15 DIAGNOSIS — I5032 Chronic diastolic (congestive) heart failure: Secondary | ICD-10-CM | POA: Diagnosis not present

## 2017-06-15 DIAGNOSIS — J449 Chronic obstructive pulmonary disease, unspecified: Secondary | ICD-10-CM | POA: Diagnosis not present

## 2017-06-15 DIAGNOSIS — F0390 Unspecified dementia without behavioral disturbance: Secondary | ICD-10-CM | POA: Diagnosis not present

## 2017-06-15 DIAGNOSIS — R64 Cachexia: Secondary | ICD-10-CM | POA: Diagnosis not present

## 2017-06-15 DIAGNOSIS — F419 Anxiety disorder, unspecified: Secondary | ICD-10-CM | POA: Diagnosis not present

## 2017-06-18 DIAGNOSIS — R64 Cachexia: Secondary | ICD-10-CM | POA: Diagnosis not present

## 2017-06-18 DIAGNOSIS — J449 Chronic obstructive pulmonary disease, unspecified: Secondary | ICD-10-CM | POA: Diagnosis not present

## 2017-06-18 DIAGNOSIS — F0390 Unspecified dementia without behavioral disturbance: Secondary | ICD-10-CM | POA: Diagnosis not present

## 2017-06-18 DIAGNOSIS — F419 Anxiety disorder, unspecified: Secondary | ICD-10-CM | POA: Diagnosis not present

## 2017-06-18 DIAGNOSIS — I5032 Chronic diastolic (congestive) heart failure: Secondary | ICD-10-CM | POA: Diagnosis not present

## 2017-06-19 ENCOUNTER — Ambulatory Visit: Payer: Medicare Other | Admitting: Family Medicine

## 2017-06-20 DIAGNOSIS — J449 Chronic obstructive pulmonary disease, unspecified: Secondary | ICD-10-CM | POA: Diagnosis not present

## 2017-06-20 DIAGNOSIS — R64 Cachexia: Secondary | ICD-10-CM | POA: Diagnosis not present

## 2017-06-20 DIAGNOSIS — F419 Anxiety disorder, unspecified: Secondary | ICD-10-CM | POA: Diagnosis not present

## 2017-06-20 DIAGNOSIS — I5032 Chronic diastolic (congestive) heart failure: Secondary | ICD-10-CM | POA: Diagnosis not present

## 2017-06-20 DIAGNOSIS — F0390 Unspecified dementia without behavioral disturbance: Secondary | ICD-10-CM | POA: Diagnosis not present

## 2017-06-21 DIAGNOSIS — I5032 Chronic diastolic (congestive) heart failure: Secondary | ICD-10-CM | POA: Diagnosis not present

## 2017-06-21 DIAGNOSIS — F0390 Unspecified dementia without behavioral disturbance: Secondary | ICD-10-CM | POA: Diagnosis not present

## 2017-06-21 DIAGNOSIS — F419 Anxiety disorder, unspecified: Secondary | ICD-10-CM | POA: Diagnosis not present

## 2017-06-21 DIAGNOSIS — R64 Cachexia: Secondary | ICD-10-CM | POA: Diagnosis not present

## 2017-06-21 DIAGNOSIS — J449 Chronic obstructive pulmonary disease, unspecified: Secondary | ICD-10-CM | POA: Diagnosis not present

## 2017-06-22 DIAGNOSIS — F419 Anxiety disorder, unspecified: Secondary | ICD-10-CM | POA: Diagnosis not present

## 2017-06-22 DIAGNOSIS — J449 Chronic obstructive pulmonary disease, unspecified: Secondary | ICD-10-CM | POA: Diagnosis not present

## 2017-06-22 DIAGNOSIS — F0390 Unspecified dementia without behavioral disturbance: Secondary | ICD-10-CM | POA: Diagnosis not present

## 2017-06-22 DIAGNOSIS — I5032 Chronic diastolic (congestive) heart failure: Secondary | ICD-10-CM | POA: Diagnosis not present

## 2017-06-22 DIAGNOSIS — R64 Cachexia: Secondary | ICD-10-CM | POA: Diagnosis not present

## 2017-06-25 DIAGNOSIS — R64 Cachexia: Secondary | ICD-10-CM | POA: Diagnosis not present

## 2017-06-25 DIAGNOSIS — F419 Anxiety disorder, unspecified: Secondary | ICD-10-CM | POA: Diagnosis not present

## 2017-06-25 DIAGNOSIS — F0390 Unspecified dementia without behavioral disturbance: Secondary | ICD-10-CM | POA: Diagnosis not present

## 2017-06-25 DIAGNOSIS — I5032 Chronic diastolic (congestive) heart failure: Secondary | ICD-10-CM | POA: Diagnosis not present

## 2017-06-25 DIAGNOSIS — J449 Chronic obstructive pulmonary disease, unspecified: Secondary | ICD-10-CM | POA: Diagnosis not present

## 2017-06-27 DIAGNOSIS — F419 Anxiety disorder, unspecified: Secondary | ICD-10-CM | POA: Diagnosis not present

## 2017-06-27 DIAGNOSIS — J449 Chronic obstructive pulmonary disease, unspecified: Secondary | ICD-10-CM | POA: Diagnosis not present

## 2017-06-27 DIAGNOSIS — F0390 Unspecified dementia without behavioral disturbance: Secondary | ICD-10-CM | POA: Diagnosis not present

## 2017-06-27 DIAGNOSIS — R64 Cachexia: Secondary | ICD-10-CM | POA: Diagnosis not present

## 2017-06-27 DIAGNOSIS — I5032 Chronic diastolic (congestive) heart failure: Secondary | ICD-10-CM | POA: Diagnosis not present

## 2017-06-28 DIAGNOSIS — J449 Chronic obstructive pulmonary disease, unspecified: Secondary | ICD-10-CM | POA: Diagnosis not present

## 2017-06-28 DIAGNOSIS — I5032 Chronic diastolic (congestive) heart failure: Secondary | ICD-10-CM | POA: Diagnosis not present

## 2017-06-28 DIAGNOSIS — F0390 Unspecified dementia without behavioral disturbance: Secondary | ICD-10-CM | POA: Diagnosis not present

## 2017-06-28 DIAGNOSIS — R64 Cachexia: Secondary | ICD-10-CM | POA: Diagnosis not present

## 2017-06-28 DIAGNOSIS — F419 Anxiety disorder, unspecified: Secondary | ICD-10-CM | POA: Diagnosis not present

## 2017-06-29 DIAGNOSIS — J449 Chronic obstructive pulmonary disease, unspecified: Secondary | ICD-10-CM | POA: Diagnosis not present

## 2017-06-29 DIAGNOSIS — B351 Tinea unguium: Secondary | ICD-10-CM | POA: Diagnosis not present

## 2017-06-29 DIAGNOSIS — F0390 Unspecified dementia without behavioral disturbance: Secondary | ICD-10-CM | POA: Diagnosis not present

## 2017-06-29 DIAGNOSIS — M2012 Hallux valgus (acquired), left foot: Secondary | ICD-10-CM | POA: Diagnosis not present

## 2017-06-29 DIAGNOSIS — M2041 Other hammer toe(s) (acquired), right foot: Secondary | ICD-10-CM | POA: Diagnosis not present

## 2017-06-29 DIAGNOSIS — R64 Cachexia: Secondary | ICD-10-CM | POA: Diagnosis not present

## 2017-06-29 DIAGNOSIS — M79675 Pain in left toe(s): Secondary | ICD-10-CM | POA: Diagnosis not present

## 2017-06-29 DIAGNOSIS — I5032 Chronic diastolic (congestive) heart failure: Secondary | ICD-10-CM | POA: Diagnosis not present

## 2017-06-29 DIAGNOSIS — F419 Anxiety disorder, unspecified: Secondary | ICD-10-CM | POA: Diagnosis not present

## 2017-06-29 DIAGNOSIS — I739 Peripheral vascular disease, unspecified: Secondary | ICD-10-CM | POA: Diagnosis not present

## 2017-07-02 DIAGNOSIS — F0390 Unspecified dementia without behavioral disturbance: Secondary | ICD-10-CM | POA: Diagnosis not present

## 2017-07-02 DIAGNOSIS — J449 Chronic obstructive pulmonary disease, unspecified: Secondary | ICD-10-CM | POA: Diagnosis not present

## 2017-07-02 DIAGNOSIS — R64 Cachexia: Secondary | ICD-10-CM | POA: Diagnosis not present

## 2017-07-02 DIAGNOSIS — F419 Anxiety disorder, unspecified: Secondary | ICD-10-CM | POA: Diagnosis not present

## 2017-07-02 DIAGNOSIS — I5032 Chronic diastolic (congestive) heart failure: Secondary | ICD-10-CM | POA: Diagnosis not present

## 2017-07-03 DIAGNOSIS — R64 Cachexia: Secondary | ICD-10-CM | POA: Diagnosis not present

## 2017-07-03 DIAGNOSIS — F419 Anxiety disorder, unspecified: Secondary | ICD-10-CM | POA: Diagnosis not present

## 2017-07-03 DIAGNOSIS — F0390 Unspecified dementia without behavioral disturbance: Secondary | ICD-10-CM | POA: Diagnosis not present

## 2017-07-03 DIAGNOSIS — J449 Chronic obstructive pulmonary disease, unspecified: Secondary | ICD-10-CM | POA: Diagnosis not present

## 2017-07-03 DIAGNOSIS — I5032 Chronic diastolic (congestive) heart failure: Secondary | ICD-10-CM | POA: Diagnosis not present

## 2017-07-04 DIAGNOSIS — I5032 Chronic diastolic (congestive) heart failure: Secondary | ICD-10-CM | POA: Diagnosis not present

## 2017-07-04 DIAGNOSIS — R64 Cachexia: Secondary | ICD-10-CM | POA: Diagnosis not present

## 2017-07-04 DIAGNOSIS — F0390 Unspecified dementia without behavioral disturbance: Secondary | ICD-10-CM | POA: Diagnosis not present

## 2017-07-04 DIAGNOSIS — F419 Anxiety disorder, unspecified: Secondary | ICD-10-CM | POA: Diagnosis not present

## 2017-07-04 DIAGNOSIS — J449 Chronic obstructive pulmonary disease, unspecified: Secondary | ICD-10-CM | POA: Diagnosis not present

## 2017-07-06 DIAGNOSIS — F419 Anxiety disorder, unspecified: Secondary | ICD-10-CM | POA: Diagnosis not present

## 2017-07-06 DIAGNOSIS — F0390 Unspecified dementia without behavioral disturbance: Secondary | ICD-10-CM | POA: Diagnosis not present

## 2017-07-06 DIAGNOSIS — J449 Chronic obstructive pulmonary disease, unspecified: Secondary | ICD-10-CM | POA: Diagnosis not present

## 2017-07-06 DIAGNOSIS — R64 Cachexia: Secondary | ICD-10-CM | POA: Diagnosis not present

## 2017-07-06 DIAGNOSIS — I5032 Chronic diastolic (congestive) heart failure: Secondary | ICD-10-CM | POA: Diagnosis not present

## 2017-07-09 DIAGNOSIS — R64 Cachexia: Secondary | ICD-10-CM | POA: Diagnosis not present

## 2017-07-09 DIAGNOSIS — I5032 Chronic diastolic (congestive) heart failure: Secondary | ICD-10-CM | POA: Diagnosis not present

## 2017-07-09 DIAGNOSIS — F0390 Unspecified dementia without behavioral disturbance: Secondary | ICD-10-CM | POA: Diagnosis not present

## 2017-07-09 DIAGNOSIS — F419 Anxiety disorder, unspecified: Secondary | ICD-10-CM | POA: Diagnosis not present

## 2017-07-09 DIAGNOSIS — J449 Chronic obstructive pulmonary disease, unspecified: Secondary | ICD-10-CM | POA: Diagnosis not present

## 2017-07-10 DIAGNOSIS — F0390 Unspecified dementia without behavioral disturbance: Secondary | ICD-10-CM | POA: Diagnosis not present

## 2017-07-10 DIAGNOSIS — J449 Chronic obstructive pulmonary disease, unspecified: Secondary | ICD-10-CM | POA: Diagnosis not present

## 2017-07-10 DIAGNOSIS — I5032 Chronic diastolic (congestive) heart failure: Secondary | ICD-10-CM | POA: Diagnosis not present

## 2017-07-10 DIAGNOSIS — R64 Cachexia: Secondary | ICD-10-CM | POA: Diagnosis not present

## 2017-07-10 DIAGNOSIS — F419 Anxiety disorder, unspecified: Secondary | ICD-10-CM | POA: Diagnosis not present

## 2017-07-11 DIAGNOSIS — F0390 Unspecified dementia without behavioral disturbance: Secondary | ICD-10-CM | POA: Diagnosis not present

## 2017-07-11 DIAGNOSIS — I5032 Chronic diastolic (congestive) heart failure: Secondary | ICD-10-CM | POA: Diagnosis not present

## 2017-07-11 DIAGNOSIS — F419 Anxiety disorder, unspecified: Secondary | ICD-10-CM | POA: Diagnosis not present

## 2017-07-11 DIAGNOSIS — J449 Chronic obstructive pulmonary disease, unspecified: Secondary | ICD-10-CM | POA: Diagnosis not present

## 2017-07-11 DIAGNOSIS — R64 Cachexia: Secondary | ICD-10-CM | POA: Diagnosis not present

## 2017-07-13 DIAGNOSIS — I5032 Chronic diastolic (congestive) heart failure: Secondary | ICD-10-CM | POA: Diagnosis not present

## 2017-07-13 DIAGNOSIS — J449 Chronic obstructive pulmonary disease, unspecified: Secondary | ICD-10-CM | POA: Diagnosis not present

## 2017-07-13 DIAGNOSIS — F419 Anxiety disorder, unspecified: Secondary | ICD-10-CM | POA: Diagnosis not present

## 2017-07-13 DIAGNOSIS — F0390 Unspecified dementia without behavioral disturbance: Secondary | ICD-10-CM | POA: Diagnosis not present

## 2017-07-13 DIAGNOSIS — R64 Cachexia: Secondary | ICD-10-CM | POA: Diagnosis not present

## 2017-07-17 DIAGNOSIS — F419 Anxiety disorder, unspecified: Secondary | ICD-10-CM | POA: Diagnosis not present

## 2017-07-17 DIAGNOSIS — R64 Cachexia: Secondary | ICD-10-CM | POA: Diagnosis not present

## 2017-07-17 DIAGNOSIS — F0390 Unspecified dementia without behavioral disturbance: Secondary | ICD-10-CM | POA: Diagnosis not present

## 2017-07-17 DIAGNOSIS — I5032 Chronic diastolic (congestive) heart failure: Secondary | ICD-10-CM | POA: Diagnosis not present

## 2017-07-17 DIAGNOSIS — J449 Chronic obstructive pulmonary disease, unspecified: Secondary | ICD-10-CM | POA: Diagnosis not present

## 2017-07-18 DIAGNOSIS — F419 Anxiety disorder, unspecified: Secondary | ICD-10-CM | POA: Diagnosis not present

## 2017-07-18 DIAGNOSIS — I5032 Chronic diastolic (congestive) heart failure: Secondary | ICD-10-CM | POA: Diagnosis not present

## 2017-07-18 DIAGNOSIS — J449 Chronic obstructive pulmonary disease, unspecified: Secondary | ICD-10-CM | POA: Diagnosis not present

## 2017-07-18 DIAGNOSIS — R64 Cachexia: Secondary | ICD-10-CM | POA: Diagnosis not present

## 2017-07-18 DIAGNOSIS — F0390 Unspecified dementia without behavioral disturbance: Secondary | ICD-10-CM | POA: Diagnosis not present

## 2017-07-20 DIAGNOSIS — J449 Chronic obstructive pulmonary disease, unspecified: Secondary | ICD-10-CM | POA: Diagnosis not present

## 2017-07-20 DIAGNOSIS — F419 Anxiety disorder, unspecified: Secondary | ICD-10-CM | POA: Diagnosis not present

## 2017-07-20 DIAGNOSIS — I5032 Chronic diastolic (congestive) heart failure: Secondary | ICD-10-CM | POA: Diagnosis not present

## 2017-07-20 DIAGNOSIS — R64 Cachexia: Secondary | ICD-10-CM | POA: Diagnosis not present

## 2017-07-20 DIAGNOSIS — F0390 Unspecified dementia without behavioral disturbance: Secondary | ICD-10-CM | POA: Diagnosis not present

## 2017-07-21 DIAGNOSIS — J449 Chronic obstructive pulmonary disease, unspecified: Secondary | ICD-10-CM | POA: Diagnosis not present

## 2017-07-21 DIAGNOSIS — R64 Cachexia: Secondary | ICD-10-CM | POA: Diagnosis not present

## 2017-07-21 DIAGNOSIS — I5032 Chronic diastolic (congestive) heart failure: Secondary | ICD-10-CM | POA: Diagnosis not present

## 2017-07-21 DIAGNOSIS — F0390 Unspecified dementia without behavioral disturbance: Secondary | ICD-10-CM | POA: Diagnosis not present

## 2017-07-21 DIAGNOSIS — F419 Anxiety disorder, unspecified: Secondary | ICD-10-CM | POA: Diagnosis not present

## 2017-07-23 DIAGNOSIS — I5032 Chronic diastolic (congestive) heart failure: Secondary | ICD-10-CM | POA: Diagnosis not present

## 2017-07-23 DIAGNOSIS — J449 Chronic obstructive pulmonary disease, unspecified: Secondary | ICD-10-CM | POA: Diagnosis not present

## 2017-07-23 DIAGNOSIS — F0390 Unspecified dementia without behavioral disturbance: Secondary | ICD-10-CM | POA: Diagnosis not present

## 2017-07-23 DIAGNOSIS — F419 Anxiety disorder, unspecified: Secondary | ICD-10-CM | POA: Diagnosis not present

## 2017-07-23 DIAGNOSIS — R64 Cachexia: Secondary | ICD-10-CM | POA: Diagnosis not present

## 2017-07-24 ENCOUNTER — Telehealth: Payer: Self-pay | Admitting: Family Medicine

## 2017-07-24 DIAGNOSIS — I5032 Chronic diastolic (congestive) heart failure: Secondary | ICD-10-CM | POA: Diagnosis not present

## 2017-07-24 DIAGNOSIS — R64 Cachexia: Secondary | ICD-10-CM | POA: Diagnosis not present

## 2017-07-24 DIAGNOSIS — F419 Anxiety disorder, unspecified: Secondary | ICD-10-CM | POA: Diagnosis not present

## 2017-07-24 DIAGNOSIS — F0390 Unspecified dementia without behavioral disturbance: Secondary | ICD-10-CM | POA: Diagnosis not present

## 2017-07-24 DIAGNOSIS — J449 Chronic obstructive pulmonary disease, unspecified: Secondary | ICD-10-CM | POA: Diagnosis not present

## 2017-07-24 NOTE — Telephone Encounter (Signed)
Ruthann Cancer from [facility] left a voicemail that patients full order (back page) was not sent. Please advise. [place] / [phone]

## 2017-07-24 NOTE — Telephone Encounter (Signed)
Resent again

## 2017-07-25 DIAGNOSIS — F0390 Unspecified dementia without behavioral disturbance: Secondary | ICD-10-CM | POA: Diagnosis not present

## 2017-07-25 DIAGNOSIS — I5032 Chronic diastolic (congestive) heart failure: Secondary | ICD-10-CM | POA: Diagnosis not present

## 2017-07-25 DIAGNOSIS — R64 Cachexia: Secondary | ICD-10-CM | POA: Diagnosis not present

## 2017-07-25 DIAGNOSIS — F419 Anxiety disorder, unspecified: Secondary | ICD-10-CM | POA: Diagnosis not present

## 2017-07-25 DIAGNOSIS — J449 Chronic obstructive pulmonary disease, unspecified: Secondary | ICD-10-CM | POA: Diagnosis not present

## 2017-07-27 DIAGNOSIS — J449 Chronic obstructive pulmonary disease, unspecified: Secondary | ICD-10-CM | POA: Diagnosis not present

## 2017-07-27 DIAGNOSIS — F0390 Unspecified dementia without behavioral disturbance: Secondary | ICD-10-CM | POA: Diagnosis not present

## 2017-07-27 DIAGNOSIS — R64 Cachexia: Secondary | ICD-10-CM | POA: Diagnosis not present

## 2017-07-27 DIAGNOSIS — F419 Anxiety disorder, unspecified: Secondary | ICD-10-CM | POA: Diagnosis not present

## 2017-07-27 DIAGNOSIS — I5032 Chronic diastolic (congestive) heart failure: Secondary | ICD-10-CM | POA: Diagnosis not present

## 2017-07-30 DIAGNOSIS — F419 Anxiety disorder, unspecified: Secondary | ICD-10-CM | POA: Diagnosis not present

## 2017-07-30 DIAGNOSIS — J449 Chronic obstructive pulmonary disease, unspecified: Secondary | ICD-10-CM | POA: Diagnosis not present

## 2017-07-30 DIAGNOSIS — I5032 Chronic diastolic (congestive) heart failure: Secondary | ICD-10-CM | POA: Diagnosis not present

## 2017-07-30 DIAGNOSIS — F0390 Unspecified dementia without behavioral disturbance: Secondary | ICD-10-CM | POA: Diagnosis not present

## 2017-07-30 DIAGNOSIS — R64 Cachexia: Secondary | ICD-10-CM | POA: Diagnosis not present

## 2017-07-31 DIAGNOSIS — J449 Chronic obstructive pulmonary disease, unspecified: Secondary | ICD-10-CM | POA: Diagnosis not present

## 2017-07-31 DIAGNOSIS — F419 Anxiety disorder, unspecified: Secondary | ICD-10-CM | POA: Diagnosis not present

## 2017-07-31 DIAGNOSIS — I5032 Chronic diastolic (congestive) heart failure: Secondary | ICD-10-CM | POA: Diagnosis not present

## 2017-07-31 DIAGNOSIS — R64 Cachexia: Secondary | ICD-10-CM | POA: Diagnosis not present

## 2017-07-31 DIAGNOSIS — F0390 Unspecified dementia without behavioral disturbance: Secondary | ICD-10-CM | POA: Diagnosis not present

## 2017-08-01 DIAGNOSIS — F419 Anxiety disorder, unspecified: Secondary | ICD-10-CM | POA: Diagnosis not present

## 2017-08-01 DIAGNOSIS — I5032 Chronic diastolic (congestive) heart failure: Secondary | ICD-10-CM | POA: Diagnosis not present

## 2017-08-01 DIAGNOSIS — F0390 Unspecified dementia without behavioral disturbance: Secondary | ICD-10-CM | POA: Diagnosis not present

## 2017-08-01 DIAGNOSIS — R64 Cachexia: Secondary | ICD-10-CM | POA: Diagnosis not present

## 2017-08-01 DIAGNOSIS — J449 Chronic obstructive pulmonary disease, unspecified: Secondary | ICD-10-CM | POA: Diagnosis not present

## 2017-08-03 ENCOUNTER — Other Ambulatory Visit: Payer: Self-pay | Admitting: Family Medicine

## 2017-08-03 DIAGNOSIS — J449 Chronic obstructive pulmonary disease, unspecified: Secondary | ICD-10-CM | POA: Diagnosis not present

## 2017-08-03 DIAGNOSIS — F419 Anxiety disorder, unspecified: Secondary | ICD-10-CM | POA: Diagnosis not present

## 2017-08-03 DIAGNOSIS — R64 Cachexia: Secondary | ICD-10-CM | POA: Diagnosis not present

## 2017-08-03 DIAGNOSIS — I5032 Chronic diastolic (congestive) heart failure: Secondary | ICD-10-CM | POA: Diagnosis not present

## 2017-08-03 DIAGNOSIS — F0390 Unspecified dementia without behavioral disturbance: Secondary | ICD-10-CM | POA: Diagnosis not present

## 2017-08-06 DIAGNOSIS — R64 Cachexia: Secondary | ICD-10-CM | POA: Diagnosis not present

## 2017-08-06 DIAGNOSIS — F0390 Unspecified dementia without behavioral disturbance: Secondary | ICD-10-CM | POA: Diagnosis not present

## 2017-08-06 DIAGNOSIS — J449 Chronic obstructive pulmonary disease, unspecified: Secondary | ICD-10-CM | POA: Diagnosis not present

## 2017-08-06 DIAGNOSIS — F419 Anxiety disorder, unspecified: Secondary | ICD-10-CM | POA: Diagnosis not present

## 2017-08-06 DIAGNOSIS — I5032 Chronic diastolic (congestive) heart failure: Secondary | ICD-10-CM | POA: Diagnosis not present

## 2017-08-08 DIAGNOSIS — F0390 Unspecified dementia without behavioral disturbance: Secondary | ICD-10-CM | POA: Diagnosis not present

## 2017-08-08 DIAGNOSIS — R64 Cachexia: Secondary | ICD-10-CM | POA: Diagnosis not present

## 2017-08-08 DIAGNOSIS — I5032 Chronic diastolic (congestive) heart failure: Secondary | ICD-10-CM | POA: Diagnosis not present

## 2017-08-08 DIAGNOSIS — J449 Chronic obstructive pulmonary disease, unspecified: Secondary | ICD-10-CM | POA: Diagnosis not present

## 2017-08-08 DIAGNOSIS — F419 Anxiety disorder, unspecified: Secondary | ICD-10-CM | POA: Diagnosis not present

## 2017-08-10 DIAGNOSIS — F419 Anxiety disorder, unspecified: Secondary | ICD-10-CM | POA: Diagnosis not present

## 2017-08-10 DIAGNOSIS — R64 Cachexia: Secondary | ICD-10-CM | POA: Diagnosis not present

## 2017-08-10 DIAGNOSIS — F0390 Unspecified dementia without behavioral disturbance: Secondary | ICD-10-CM | POA: Diagnosis not present

## 2017-08-10 DIAGNOSIS — J449 Chronic obstructive pulmonary disease, unspecified: Secondary | ICD-10-CM | POA: Diagnosis not present

## 2017-08-10 DIAGNOSIS — I5032 Chronic diastolic (congestive) heart failure: Secondary | ICD-10-CM | POA: Diagnosis not present

## 2017-08-13 DIAGNOSIS — J449 Chronic obstructive pulmonary disease, unspecified: Secondary | ICD-10-CM | POA: Diagnosis not present

## 2017-08-13 DIAGNOSIS — I5032 Chronic diastolic (congestive) heart failure: Secondary | ICD-10-CM | POA: Diagnosis not present

## 2017-08-13 DIAGNOSIS — F419 Anxiety disorder, unspecified: Secondary | ICD-10-CM | POA: Diagnosis not present

## 2017-08-13 DIAGNOSIS — F0390 Unspecified dementia without behavioral disturbance: Secondary | ICD-10-CM | POA: Diagnosis not present

## 2017-08-13 DIAGNOSIS — R64 Cachexia: Secondary | ICD-10-CM | POA: Diagnosis not present

## 2017-08-15 DIAGNOSIS — I5032 Chronic diastolic (congestive) heart failure: Secondary | ICD-10-CM | POA: Diagnosis not present

## 2017-08-15 DIAGNOSIS — J449 Chronic obstructive pulmonary disease, unspecified: Secondary | ICD-10-CM | POA: Diagnosis not present

## 2017-08-15 DIAGNOSIS — F0390 Unspecified dementia without behavioral disturbance: Secondary | ICD-10-CM | POA: Diagnosis not present

## 2017-08-15 DIAGNOSIS — F419 Anxiety disorder, unspecified: Secondary | ICD-10-CM | POA: Diagnosis not present

## 2017-08-15 DIAGNOSIS — R64 Cachexia: Secondary | ICD-10-CM | POA: Diagnosis not present

## 2017-08-17 DIAGNOSIS — F0390 Unspecified dementia without behavioral disturbance: Secondary | ICD-10-CM | POA: Diagnosis not present

## 2017-08-17 DIAGNOSIS — I5032 Chronic diastolic (congestive) heart failure: Secondary | ICD-10-CM | POA: Diagnosis not present

## 2017-08-17 DIAGNOSIS — R64 Cachexia: Secondary | ICD-10-CM | POA: Diagnosis not present

## 2017-08-17 DIAGNOSIS — F419 Anxiety disorder, unspecified: Secondary | ICD-10-CM | POA: Diagnosis not present

## 2017-08-17 DIAGNOSIS — J449 Chronic obstructive pulmonary disease, unspecified: Secondary | ICD-10-CM | POA: Diagnosis not present

## 2017-08-20 DIAGNOSIS — R64 Cachexia: Secondary | ICD-10-CM | POA: Diagnosis not present

## 2017-08-20 DIAGNOSIS — F419 Anxiety disorder, unspecified: Secondary | ICD-10-CM | POA: Diagnosis not present

## 2017-08-20 DIAGNOSIS — F0390 Unspecified dementia without behavioral disturbance: Secondary | ICD-10-CM | POA: Diagnosis not present

## 2017-08-20 DIAGNOSIS — J449 Chronic obstructive pulmonary disease, unspecified: Secondary | ICD-10-CM | POA: Diagnosis not present

## 2017-08-20 DIAGNOSIS — I5032 Chronic diastolic (congestive) heart failure: Secondary | ICD-10-CM | POA: Diagnosis not present

## 2017-08-21 DIAGNOSIS — F419 Anxiety disorder, unspecified: Secondary | ICD-10-CM | POA: Diagnosis not present

## 2017-08-21 DIAGNOSIS — J449 Chronic obstructive pulmonary disease, unspecified: Secondary | ICD-10-CM | POA: Diagnosis not present

## 2017-08-21 DIAGNOSIS — R64 Cachexia: Secondary | ICD-10-CM | POA: Diagnosis not present

## 2017-08-21 DIAGNOSIS — F0390 Unspecified dementia without behavioral disturbance: Secondary | ICD-10-CM | POA: Diagnosis not present

## 2017-08-21 DIAGNOSIS — I5032 Chronic diastolic (congestive) heart failure: Secondary | ICD-10-CM | POA: Diagnosis not present

## 2017-08-22 DIAGNOSIS — F419 Anxiety disorder, unspecified: Secondary | ICD-10-CM | POA: Diagnosis not present

## 2017-08-22 DIAGNOSIS — R64 Cachexia: Secondary | ICD-10-CM | POA: Diagnosis not present

## 2017-08-22 DIAGNOSIS — F0390 Unspecified dementia without behavioral disturbance: Secondary | ICD-10-CM | POA: Diagnosis not present

## 2017-08-22 DIAGNOSIS — J449 Chronic obstructive pulmonary disease, unspecified: Secondary | ICD-10-CM | POA: Diagnosis not present

## 2017-08-22 DIAGNOSIS — I5032 Chronic diastolic (congestive) heart failure: Secondary | ICD-10-CM | POA: Diagnosis not present

## 2017-08-27 DIAGNOSIS — F419 Anxiety disorder, unspecified: Secondary | ICD-10-CM | POA: Diagnosis not present

## 2017-08-27 DIAGNOSIS — I5032 Chronic diastolic (congestive) heart failure: Secondary | ICD-10-CM | POA: Diagnosis not present

## 2017-08-27 DIAGNOSIS — R64 Cachexia: Secondary | ICD-10-CM | POA: Diagnosis not present

## 2017-08-27 DIAGNOSIS — J449 Chronic obstructive pulmonary disease, unspecified: Secondary | ICD-10-CM | POA: Diagnosis not present

## 2017-08-27 DIAGNOSIS — F0390 Unspecified dementia without behavioral disturbance: Secondary | ICD-10-CM | POA: Diagnosis not present

## 2017-08-29 ENCOUNTER — Other Ambulatory Visit: Payer: Self-pay | Admitting: Family Medicine

## 2017-08-29 DIAGNOSIS — I5032 Chronic diastolic (congestive) heart failure: Secondary | ICD-10-CM | POA: Diagnosis not present

## 2017-08-29 DIAGNOSIS — F419 Anxiety disorder, unspecified: Secondary | ICD-10-CM | POA: Diagnosis not present

## 2017-08-29 DIAGNOSIS — F0390 Unspecified dementia without behavioral disturbance: Secondary | ICD-10-CM | POA: Diagnosis not present

## 2017-08-29 DIAGNOSIS — R64 Cachexia: Secondary | ICD-10-CM | POA: Diagnosis not present

## 2017-08-29 DIAGNOSIS — J449 Chronic obstructive pulmonary disease, unspecified: Secondary | ICD-10-CM | POA: Diagnosis not present

## 2017-08-31 DIAGNOSIS — I5032 Chronic diastolic (congestive) heart failure: Secondary | ICD-10-CM | POA: Diagnosis not present

## 2017-08-31 DIAGNOSIS — F419 Anxiety disorder, unspecified: Secondary | ICD-10-CM | POA: Diagnosis not present

## 2017-08-31 DIAGNOSIS — F0390 Unspecified dementia without behavioral disturbance: Secondary | ICD-10-CM | POA: Diagnosis not present

## 2017-08-31 DIAGNOSIS — J449 Chronic obstructive pulmonary disease, unspecified: Secondary | ICD-10-CM | POA: Diagnosis not present

## 2017-08-31 DIAGNOSIS — R64 Cachexia: Secondary | ICD-10-CM | POA: Diagnosis not present

## 2017-09-05 DIAGNOSIS — F0390 Unspecified dementia without behavioral disturbance: Secondary | ICD-10-CM | POA: Diagnosis not present

## 2017-09-05 DIAGNOSIS — R64 Cachexia: Secondary | ICD-10-CM | POA: Diagnosis not present

## 2017-09-05 DIAGNOSIS — F419 Anxiety disorder, unspecified: Secondary | ICD-10-CM | POA: Diagnosis not present

## 2017-09-05 DIAGNOSIS — J449 Chronic obstructive pulmonary disease, unspecified: Secondary | ICD-10-CM | POA: Diagnosis not present

## 2017-09-05 DIAGNOSIS — I5032 Chronic diastolic (congestive) heart failure: Secondary | ICD-10-CM | POA: Diagnosis not present

## 2017-09-06 ENCOUNTER — Other Ambulatory Visit: Payer: Self-pay | Admitting: Family Medicine

## 2017-09-10 DIAGNOSIS — J449 Chronic obstructive pulmonary disease, unspecified: Secondary | ICD-10-CM | POA: Diagnosis not present

## 2017-09-10 DIAGNOSIS — R64 Cachexia: Secondary | ICD-10-CM | POA: Diagnosis not present

## 2017-09-10 DIAGNOSIS — I5032 Chronic diastolic (congestive) heart failure: Secondary | ICD-10-CM | POA: Diagnosis not present

## 2017-09-10 DIAGNOSIS — F0390 Unspecified dementia without behavioral disturbance: Secondary | ICD-10-CM | POA: Diagnosis not present

## 2017-09-10 DIAGNOSIS — F419 Anxiety disorder, unspecified: Secondary | ICD-10-CM | POA: Diagnosis not present

## 2017-09-12 DIAGNOSIS — F0390 Unspecified dementia without behavioral disturbance: Secondary | ICD-10-CM | POA: Diagnosis not present

## 2017-09-12 DIAGNOSIS — F419 Anxiety disorder, unspecified: Secondary | ICD-10-CM | POA: Diagnosis not present

## 2017-09-12 DIAGNOSIS — I5032 Chronic diastolic (congestive) heart failure: Secondary | ICD-10-CM | POA: Diagnosis not present

## 2017-09-12 DIAGNOSIS — J449 Chronic obstructive pulmonary disease, unspecified: Secondary | ICD-10-CM | POA: Diagnosis not present

## 2017-09-12 DIAGNOSIS — R64 Cachexia: Secondary | ICD-10-CM | POA: Diagnosis not present

## 2017-09-14 DIAGNOSIS — J449 Chronic obstructive pulmonary disease, unspecified: Secondary | ICD-10-CM | POA: Diagnosis not present

## 2017-09-14 DIAGNOSIS — F0390 Unspecified dementia without behavioral disturbance: Secondary | ICD-10-CM | POA: Diagnosis not present

## 2017-09-14 DIAGNOSIS — I5032 Chronic diastolic (congestive) heart failure: Secondary | ICD-10-CM | POA: Diagnosis not present

## 2017-09-14 DIAGNOSIS — R64 Cachexia: Secondary | ICD-10-CM | POA: Diagnosis not present

## 2017-09-14 DIAGNOSIS — F419 Anxiety disorder, unspecified: Secondary | ICD-10-CM | POA: Diagnosis not present

## 2017-09-17 DIAGNOSIS — R64 Cachexia: Secondary | ICD-10-CM | POA: Diagnosis not present

## 2017-09-17 DIAGNOSIS — J449 Chronic obstructive pulmonary disease, unspecified: Secondary | ICD-10-CM | POA: Diagnosis not present

## 2017-09-17 DIAGNOSIS — F0390 Unspecified dementia without behavioral disturbance: Secondary | ICD-10-CM | POA: Diagnosis not present

## 2017-09-17 DIAGNOSIS — F419 Anxiety disorder, unspecified: Secondary | ICD-10-CM | POA: Diagnosis not present

## 2017-09-17 DIAGNOSIS — I5032 Chronic diastolic (congestive) heart failure: Secondary | ICD-10-CM | POA: Diagnosis not present

## 2017-09-19 DIAGNOSIS — R64 Cachexia: Secondary | ICD-10-CM | POA: Diagnosis not present

## 2017-09-19 DIAGNOSIS — J449 Chronic obstructive pulmonary disease, unspecified: Secondary | ICD-10-CM | POA: Diagnosis not present

## 2017-09-19 DIAGNOSIS — F419 Anxiety disorder, unspecified: Secondary | ICD-10-CM | POA: Diagnosis not present

## 2017-09-19 DIAGNOSIS — F0390 Unspecified dementia without behavioral disturbance: Secondary | ICD-10-CM | POA: Diagnosis not present

## 2017-09-19 DIAGNOSIS — I5032 Chronic diastolic (congestive) heart failure: Secondary | ICD-10-CM | POA: Diagnosis not present

## 2017-09-20 DIAGNOSIS — F419 Anxiety disorder, unspecified: Secondary | ICD-10-CM | POA: Diagnosis not present

## 2017-09-20 DIAGNOSIS — I5032 Chronic diastolic (congestive) heart failure: Secondary | ICD-10-CM | POA: Diagnosis not present

## 2017-09-20 DIAGNOSIS — R64 Cachexia: Secondary | ICD-10-CM | POA: Diagnosis not present

## 2017-09-20 DIAGNOSIS — J449 Chronic obstructive pulmonary disease, unspecified: Secondary | ICD-10-CM | POA: Diagnosis not present

## 2017-09-20 DIAGNOSIS — F0390 Unspecified dementia without behavioral disturbance: Secondary | ICD-10-CM | POA: Diagnosis not present

## 2017-09-21 DIAGNOSIS — J449 Chronic obstructive pulmonary disease, unspecified: Secondary | ICD-10-CM | POA: Diagnosis not present

## 2017-09-21 DIAGNOSIS — R64 Cachexia: Secondary | ICD-10-CM | POA: Diagnosis not present

## 2017-09-21 DIAGNOSIS — F419 Anxiety disorder, unspecified: Secondary | ICD-10-CM | POA: Diagnosis not present

## 2017-09-21 DIAGNOSIS — F0390 Unspecified dementia without behavioral disturbance: Secondary | ICD-10-CM | POA: Diagnosis not present

## 2017-09-21 DIAGNOSIS — I5032 Chronic diastolic (congestive) heart failure: Secondary | ICD-10-CM | POA: Diagnosis not present

## 2017-09-25 DIAGNOSIS — F0390 Unspecified dementia without behavioral disturbance: Secondary | ICD-10-CM | POA: Diagnosis not present

## 2017-09-25 DIAGNOSIS — I5032 Chronic diastolic (congestive) heart failure: Secondary | ICD-10-CM | POA: Diagnosis not present

## 2017-09-25 DIAGNOSIS — F419 Anxiety disorder, unspecified: Secondary | ICD-10-CM | POA: Diagnosis not present

## 2017-09-25 DIAGNOSIS — R64 Cachexia: Secondary | ICD-10-CM | POA: Diagnosis not present

## 2017-09-25 DIAGNOSIS — J449 Chronic obstructive pulmonary disease, unspecified: Secondary | ICD-10-CM | POA: Diagnosis not present

## 2017-09-26 DIAGNOSIS — R64 Cachexia: Secondary | ICD-10-CM | POA: Diagnosis not present

## 2017-09-26 DIAGNOSIS — I5032 Chronic diastolic (congestive) heart failure: Secondary | ICD-10-CM | POA: Diagnosis not present

## 2017-09-26 DIAGNOSIS — J449 Chronic obstructive pulmonary disease, unspecified: Secondary | ICD-10-CM | POA: Diagnosis not present

## 2017-09-26 DIAGNOSIS — F419 Anxiety disorder, unspecified: Secondary | ICD-10-CM | POA: Diagnosis not present

## 2017-09-26 DIAGNOSIS — F0390 Unspecified dementia without behavioral disturbance: Secondary | ICD-10-CM | POA: Diagnosis not present

## 2017-09-28 DIAGNOSIS — F419 Anxiety disorder, unspecified: Secondary | ICD-10-CM | POA: Diagnosis not present

## 2017-09-28 DIAGNOSIS — I5032 Chronic diastolic (congestive) heart failure: Secondary | ICD-10-CM | POA: Diagnosis not present

## 2017-09-28 DIAGNOSIS — R64 Cachexia: Secondary | ICD-10-CM | POA: Diagnosis not present

## 2017-09-28 DIAGNOSIS — F0390 Unspecified dementia without behavioral disturbance: Secondary | ICD-10-CM | POA: Diagnosis not present

## 2017-09-28 DIAGNOSIS — J449 Chronic obstructive pulmonary disease, unspecified: Secondary | ICD-10-CM | POA: Diagnosis not present

## 2017-10-01 DIAGNOSIS — F0390 Unspecified dementia without behavioral disturbance: Secondary | ICD-10-CM | POA: Diagnosis not present

## 2017-10-01 DIAGNOSIS — F419 Anxiety disorder, unspecified: Secondary | ICD-10-CM | POA: Diagnosis not present

## 2017-10-01 DIAGNOSIS — R64 Cachexia: Secondary | ICD-10-CM | POA: Diagnosis not present

## 2017-10-01 DIAGNOSIS — J449 Chronic obstructive pulmonary disease, unspecified: Secondary | ICD-10-CM | POA: Diagnosis not present

## 2017-10-01 DIAGNOSIS — I5032 Chronic diastolic (congestive) heart failure: Secondary | ICD-10-CM | POA: Diagnosis not present

## 2017-10-03 DIAGNOSIS — I5032 Chronic diastolic (congestive) heart failure: Secondary | ICD-10-CM | POA: Diagnosis not present

## 2017-10-03 DIAGNOSIS — F419 Anxiety disorder, unspecified: Secondary | ICD-10-CM | POA: Diagnosis not present

## 2017-10-03 DIAGNOSIS — J449 Chronic obstructive pulmonary disease, unspecified: Secondary | ICD-10-CM | POA: Diagnosis not present

## 2017-10-03 DIAGNOSIS — R64 Cachexia: Secondary | ICD-10-CM | POA: Diagnosis not present

## 2017-10-03 DIAGNOSIS — F0390 Unspecified dementia without behavioral disturbance: Secondary | ICD-10-CM | POA: Diagnosis not present

## 2017-10-08 DIAGNOSIS — R64 Cachexia: Secondary | ICD-10-CM | POA: Diagnosis not present

## 2017-10-08 DIAGNOSIS — I5032 Chronic diastolic (congestive) heart failure: Secondary | ICD-10-CM | POA: Diagnosis not present

## 2017-10-08 DIAGNOSIS — F0390 Unspecified dementia without behavioral disturbance: Secondary | ICD-10-CM | POA: Diagnosis not present

## 2017-10-08 DIAGNOSIS — J449 Chronic obstructive pulmonary disease, unspecified: Secondary | ICD-10-CM | POA: Diagnosis not present

## 2017-10-08 DIAGNOSIS — F419 Anxiety disorder, unspecified: Secondary | ICD-10-CM | POA: Diagnosis not present

## 2017-10-10 ENCOUNTER — Other Ambulatory Visit: Payer: Self-pay | Admitting: Family Medicine

## 2017-10-10 DIAGNOSIS — F419 Anxiety disorder, unspecified: Secondary | ICD-10-CM | POA: Diagnosis not present

## 2017-10-10 DIAGNOSIS — J449 Chronic obstructive pulmonary disease, unspecified: Secondary | ICD-10-CM | POA: Diagnosis not present

## 2017-10-10 DIAGNOSIS — R64 Cachexia: Secondary | ICD-10-CM | POA: Diagnosis not present

## 2017-10-10 DIAGNOSIS — F0390 Unspecified dementia without behavioral disturbance: Secondary | ICD-10-CM | POA: Diagnosis not present

## 2017-10-10 DIAGNOSIS — I5032 Chronic diastolic (congestive) heart failure: Secondary | ICD-10-CM | POA: Diagnosis not present

## 2017-10-12 DIAGNOSIS — F0390 Unspecified dementia without behavioral disturbance: Secondary | ICD-10-CM | POA: Diagnosis not present

## 2017-10-12 DIAGNOSIS — R64 Cachexia: Secondary | ICD-10-CM | POA: Diagnosis not present

## 2017-10-12 DIAGNOSIS — F419 Anxiety disorder, unspecified: Secondary | ICD-10-CM | POA: Diagnosis not present

## 2017-10-12 DIAGNOSIS — I5032 Chronic diastolic (congestive) heart failure: Secondary | ICD-10-CM | POA: Diagnosis not present

## 2017-10-12 DIAGNOSIS — J449 Chronic obstructive pulmonary disease, unspecified: Secondary | ICD-10-CM | POA: Diagnosis not present

## 2017-10-15 DIAGNOSIS — F0390 Unspecified dementia without behavioral disturbance: Secondary | ICD-10-CM | POA: Diagnosis not present

## 2017-10-15 DIAGNOSIS — R64 Cachexia: Secondary | ICD-10-CM | POA: Diagnosis not present

## 2017-10-15 DIAGNOSIS — F419 Anxiety disorder, unspecified: Secondary | ICD-10-CM | POA: Diagnosis not present

## 2017-10-15 DIAGNOSIS — I5032 Chronic diastolic (congestive) heart failure: Secondary | ICD-10-CM | POA: Diagnosis not present

## 2017-10-15 DIAGNOSIS — J449 Chronic obstructive pulmonary disease, unspecified: Secondary | ICD-10-CM | POA: Diagnosis not present

## 2017-10-17 DIAGNOSIS — F419 Anxiety disorder, unspecified: Secondary | ICD-10-CM | POA: Diagnosis not present

## 2017-10-17 DIAGNOSIS — R64 Cachexia: Secondary | ICD-10-CM | POA: Diagnosis not present

## 2017-10-17 DIAGNOSIS — F0390 Unspecified dementia without behavioral disturbance: Secondary | ICD-10-CM | POA: Diagnosis not present

## 2017-10-17 DIAGNOSIS — I5032 Chronic diastolic (congestive) heart failure: Secondary | ICD-10-CM | POA: Diagnosis not present

## 2017-10-17 DIAGNOSIS — J449 Chronic obstructive pulmonary disease, unspecified: Secondary | ICD-10-CM | POA: Diagnosis not present

## 2017-10-19 DIAGNOSIS — F0390 Unspecified dementia without behavioral disturbance: Secondary | ICD-10-CM | POA: Diagnosis not present

## 2017-10-19 DIAGNOSIS — J449 Chronic obstructive pulmonary disease, unspecified: Secondary | ICD-10-CM | POA: Diagnosis not present

## 2017-10-19 DIAGNOSIS — I5032 Chronic diastolic (congestive) heart failure: Secondary | ICD-10-CM | POA: Diagnosis not present

## 2017-10-19 DIAGNOSIS — F419 Anxiety disorder, unspecified: Secondary | ICD-10-CM | POA: Diagnosis not present

## 2017-10-19 DIAGNOSIS — R64 Cachexia: Secondary | ICD-10-CM | POA: Diagnosis not present

## 2017-10-21 DIAGNOSIS — F0390 Unspecified dementia without behavioral disturbance: Secondary | ICD-10-CM | POA: Diagnosis not present

## 2017-10-21 DIAGNOSIS — R64 Cachexia: Secondary | ICD-10-CM | POA: Diagnosis not present

## 2017-10-21 DIAGNOSIS — J449 Chronic obstructive pulmonary disease, unspecified: Secondary | ICD-10-CM | POA: Diagnosis not present

## 2017-10-21 DIAGNOSIS — F419 Anxiety disorder, unspecified: Secondary | ICD-10-CM | POA: Diagnosis not present

## 2017-10-21 DIAGNOSIS — I5032 Chronic diastolic (congestive) heart failure: Secondary | ICD-10-CM | POA: Diagnosis not present

## 2017-10-24 DIAGNOSIS — J449 Chronic obstructive pulmonary disease, unspecified: Secondary | ICD-10-CM | POA: Diagnosis not present

## 2017-10-24 DIAGNOSIS — I5032 Chronic diastolic (congestive) heart failure: Secondary | ICD-10-CM | POA: Diagnosis not present

## 2017-10-24 DIAGNOSIS — R64 Cachexia: Secondary | ICD-10-CM | POA: Diagnosis not present

## 2017-10-24 DIAGNOSIS — F0390 Unspecified dementia without behavioral disturbance: Secondary | ICD-10-CM | POA: Diagnosis not present

## 2017-10-24 DIAGNOSIS — F419 Anxiety disorder, unspecified: Secondary | ICD-10-CM | POA: Diagnosis not present

## 2017-10-26 DIAGNOSIS — J449 Chronic obstructive pulmonary disease, unspecified: Secondary | ICD-10-CM | POA: Diagnosis not present

## 2017-10-26 DIAGNOSIS — R64 Cachexia: Secondary | ICD-10-CM | POA: Diagnosis not present

## 2017-10-26 DIAGNOSIS — F0390 Unspecified dementia without behavioral disturbance: Secondary | ICD-10-CM | POA: Diagnosis not present

## 2017-10-26 DIAGNOSIS — I5032 Chronic diastolic (congestive) heart failure: Secondary | ICD-10-CM | POA: Diagnosis not present

## 2017-10-26 DIAGNOSIS — F419 Anxiety disorder, unspecified: Secondary | ICD-10-CM | POA: Diagnosis not present

## 2017-10-29 DIAGNOSIS — F0390 Unspecified dementia without behavioral disturbance: Secondary | ICD-10-CM | POA: Diagnosis not present

## 2017-10-29 DIAGNOSIS — R64 Cachexia: Secondary | ICD-10-CM | POA: Diagnosis not present

## 2017-10-29 DIAGNOSIS — I5032 Chronic diastolic (congestive) heart failure: Secondary | ICD-10-CM | POA: Diagnosis not present

## 2017-10-29 DIAGNOSIS — J449 Chronic obstructive pulmonary disease, unspecified: Secondary | ICD-10-CM | POA: Diagnosis not present

## 2017-10-29 DIAGNOSIS — F419 Anxiety disorder, unspecified: Secondary | ICD-10-CM | POA: Diagnosis not present

## 2017-10-30 DIAGNOSIS — J449 Chronic obstructive pulmonary disease, unspecified: Secondary | ICD-10-CM | POA: Diagnosis not present

## 2017-10-30 DIAGNOSIS — I5032 Chronic diastolic (congestive) heart failure: Secondary | ICD-10-CM | POA: Diagnosis not present

## 2017-10-30 DIAGNOSIS — R64 Cachexia: Secondary | ICD-10-CM | POA: Diagnosis not present

## 2017-10-30 DIAGNOSIS — F0390 Unspecified dementia without behavioral disturbance: Secondary | ICD-10-CM | POA: Diagnosis not present

## 2017-10-30 DIAGNOSIS — F419 Anxiety disorder, unspecified: Secondary | ICD-10-CM | POA: Diagnosis not present

## 2017-10-31 ENCOUNTER — Other Ambulatory Visit: Payer: Self-pay | Admitting: Family Medicine

## 2017-10-31 DIAGNOSIS — J449 Chronic obstructive pulmonary disease, unspecified: Secondary | ICD-10-CM | POA: Diagnosis not present

## 2017-10-31 DIAGNOSIS — F419 Anxiety disorder, unspecified: Secondary | ICD-10-CM | POA: Diagnosis not present

## 2017-10-31 DIAGNOSIS — R64 Cachexia: Secondary | ICD-10-CM | POA: Diagnosis not present

## 2017-10-31 DIAGNOSIS — F0390 Unspecified dementia without behavioral disturbance: Secondary | ICD-10-CM | POA: Diagnosis not present

## 2017-10-31 DIAGNOSIS — I5032 Chronic diastolic (congestive) heart failure: Secondary | ICD-10-CM | POA: Diagnosis not present

## 2017-11-02 DIAGNOSIS — J449 Chronic obstructive pulmonary disease, unspecified: Secondary | ICD-10-CM | POA: Diagnosis not present

## 2017-11-02 DIAGNOSIS — F419 Anxiety disorder, unspecified: Secondary | ICD-10-CM | POA: Diagnosis not present

## 2017-11-02 DIAGNOSIS — F0390 Unspecified dementia without behavioral disturbance: Secondary | ICD-10-CM | POA: Diagnosis not present

## 2017-11-02 DIAGNOSIS — R64 Cachexia: Secondary | ICD-10-CM | POA: Diagnosis not present

## 2017-11-02 DIAGNOSIS — I5032 Chronic diastolic (congestive) heart failure: Secondary | ICD-10-CM | POA: Diagnosis not present

## 2017-11-05 DIAGNOSIS — J449 Chronic obstructive pulmonary disease, unspecified: Secondary | ICD-10-CM | POA: Diagnosis not present

## 2017-11-05 DIAGNOSIS — F0390 Unspecified dementia without behavioral disturbance: Secondary | ICD-10-CM | POA: Diagnosis not present

## 2017-11-05 DIAGNOSIS — I5032 Chronic diastolic (congestive) heart failure: Secondary | ICD-10-CM | POA: Diagnosis not present

## 2017-11-05 DIAGNOSIS — R64 Cachexia: Secondary | ICD-10-CM | POA: Diagnosis not present

## 2017-11-05 DIAGNOSIS — F419 Anxiety disorder, unspecified: Secondary | ICD-10-CM | POA: Diagnosis not present

## 2017-11-07 DIAGNOSIS — F0390 Unspecified dementia without behavioral disturbance: Secondary | ICD-10-CM | POA: Diagnosis not present

## 2017-11-07 DIAGNOSIS — F419 Anxiety disorder, unspecified: Secondary | ICD-10-CM | POA: Diagnosis not present

## 2017-11-07 DIAGNOSIS — R64 Cachexia: Secondary | ICD-10-CM | POA: Diagnosis not present

## 2017-11-07 DIAGNOSIS — J449 Chronic obstructive pulmonary disease, unspecified: Secondary | ICD-10-CM | POA: Diagnosis not present

## 2017-11-07 DIAGNOSIS — I5032 Chronic diastolic (congestive) heart failure: Secondary | ICD-10-CM | POA: Diagnosis not present

## 2017-11-09 DIAGNOSIS — R64 Cachexia: Secondary | ICD-10-CM | POA: Diagnosis not present

## 2017-11-09 DIAGNOSIS — F419 Anxiety disorder, unspecified: Secondary | ICD-10-CM | POA: Diagnosis not present

## 2017-11-09 DIAGNOSIS — J449 Chronic obstructive pulmonary disease, unspecified: Secondary | ICD-10-CM | POA: Diagnosis not present

## 2017-11-09 DIAGNOSIS — I5032 Chronic diastolic (congestive) heart failure: Secondary | ICD-10-CM | POA: Diagnosis not present

## 2017-11-09 DIAGNOSIS — F0390 Unspecified dementia without behavioral disturbance: Secondary | ICD-10-CM | POA: Diagnosis not present

## 2017-11-12 DIAGNOSIS — R64 Cachexia: Secondary | ICD-10-CM | POA: Diagnosis not present

## 2017-11-12 DIAGNOSIS — F0390 Unspecified dementia without behavioral disturbance: Secondary | ICD-10-CM | POA: Diagnosis not present

## 2017-11-12 DIAGNOSIS — J449 Chronic obstructive pulmonary disease, unspecified: Secondary | ICD-10-CM | POA: Diagnosis not present

## 2017-11-12 DIAGNOSIS — F419 Anxiety disorder, unspecified: Secondary | ICD-10-CM | POA: Diagnosis not present

## 2017-11-12 DIAGNOSIS — I5032 Chronic diastolic (congestive) heart failure: Secondary | ICD-10-CM | POA: Diagnosis not present

## 2017-11-14 DIAGNOSIS — F0390 Unspecified dementia without behavioral disturbance: Secondary | ICD-10-CM | POA: Diagnosis not present

## 2017-11-14 DIAGNOSIS — F419 Anxiety disorder, unspecified: Secondary | ICD-10-CM | POA: Diagnosis not present

## 2017-11-14 DIAGNOSIS — I5032 Chronic diastolic (congestive) heart failure: Secondary | ICD-10-CM | POA: Diagnosis not present

## 2017-11-14 DIAGNOSIS — R64 Cachexia: Secondary | ICD-10-CM | POA: Diagnosis not present

## 2017-11-14 DIAGNOSIS — J449 Chronic obstructive pulmonary disease, unspecified: Secondary | ICD-10-CM | POA: Diagnosis not present

## 2017-11-16 DIAGNOSIS — F419 Anxiety disorder, unspecified: Secondary | ICD-10-CM | POA: Diagnosis not present

## 2017-11-16 DIAGNOSIS — R64 Cachexia: Secondary | ICD-10-CM | POA: Diagnosis not present

## 2017-11-16 DIAGNOSIS — I5032 Chronic diastolic (congestive) heart failure: Secondary | ICD-10-CM | POA: Diagnosis not present

## 2017-11-16 DIAGNOSIS — J449 Chronic obstructive pulmonary disease, unspecified: Secondary | ICD-10-CM | POA: Diagnosis not present

## 2017-11-16 DIAGNOSIS — F0390 Unspecified dementia without behavioral disturbance: Secondary | ICD-10-CM | POA: Diagnosis not present

## 2017-11-19 DIAGNOSIS — F0390 Unspecified dementia without behavioral disturbance: Secondary | ICD-10-CM | POA: Diagnosis not present

## 2017-11-19 DIAGNOSIS — R64 Cachexia: Secondary | ICD-10-CM | POA: Diagnosis not present

## 2017-11-19 DIAGNOSIS — I5032 Chronic diastolic (congestive) heart failure: Secondary | ICD-10-CM | POA: Diagnosis not present

## 2017-11-19 DIAGNOSIS — J449 Chronic obstructive pulmonary disease, unspecified: Secondary | ICD-10-CM | POA: Diagnosis not present

## 2017-11-19 DIAGNOSIS — F419 Anxiety disorder, unspecified: Secondary | ICD-10-CM | POA: Diagnosis not present

## 2017-11-20 DIAGNOSIS — I5032 Chronic diastolic (congestive) heart failure: Secondary | ICD-10-CM | POA: Diagnosis not present

## 2017-11-20 DIAGNOSIS — J449 Chronic obstructive pulmonary disease, unspecified: Secondary | ICD-10-CM | POA: Diagnosis not present

## 2017-11-20 DIAGNOSIS — R64 Cachexia: Secondary | ICD-10-CM | POA: Diagnosis not present

## 2017-11-20 DIAGNOSIS — F0390 Unspecified dementia without behavioral disturbance: Secondary | ICD-10-CM | POA: Diagnosis not present

## 2017-11-20 DIAGNOSIS — F419 Anxiety disorder, unspecified: Secondary | ICD-10-CM | POA: Diagnosis not present

## 2017-11-21 ENCOUNTER — Other Ambulatory Visit: Payer: Self-pay | Admitting: Family Medicine

## 2017-11-21 DIAGNOSIS — F0390 Unspecified dementia without behavioral disturbance: Secondary | ICD-10-CM | POA: Diagnosis not present

## 2017-11-21 DIAGNOSIS — R64 Cachexia: Secondary | ICD-10-CM | POA: Diagnosis not present

## 2017-11-21 DIAGNOSIS — J449 Chronic obstructive pulmonary disease, unspecified: Secondary | ICD-10-CM | POA: Diagnosis not present

## 2017-11-21 DIAGNOSIS — I5032 Chronic diastolic (congestive) heart failure: Secondary | ICD-10-CM | POA: Diagnosis not present

## 2017-11-21 DIAGNOSIS — F419 Anxiety disorder, unspecified: Secondary | ICD-10-CM | POA: Diagnosis not present

## 2017-11-22 DIAGNOSIS — J449 Chronic obstructive pulmonary disease, unspecified: Secondary | ICD-10-CM | POA: Diagnosis not present

## 2017-11-22 DIAGNOSIS — F419 Anxiety disorder, unspecified: Secondary | ICD-10-CM | POA: Diagnosis not present

## 2017-11-22 DIAGNOSIS — I5032 Chronic diastolic (congestive) heart failure: Secondary | ICD-10-CM | POA: Diagnosis not present

## 2017-11-22 DIAGNOSIS — F0390 Unspecified dementia without behavioral disturbance: Secondary | ICD-10-CM | POA: Diagnosis not present

## 2017-11-22 DIAGNOSIS — R64 Cachexia: Secondary | ICD-10-CM | POA: Diagnosis not present

## 2017-11-23 DIAGNOSIS — F419 Anxiety disorder, unspecified: Secondary | ICD-10-CM | POA: Diagnosis not present

## 2017-11-23 DIAGNOSIS — J449 Chronic obstructive pulmonary disease, unspecified: Secondary | ICD-10-CM | POA: Diagnosis not present

## 2017-11-23 DIAGNOSIS — F0390 Unspecified dementia without behavioral disturbance: Secondary | ICD-10-CM | POA: Diagnosis not present

## 2017-11-23 DIAGNOSIS — R64 Cachexia: Secondary | ICD-10-CM | POA: Diagnosis not present

## 2017-11-23 DIAGNOSIS — I5032 Chronic diastolic (congestive) heart failure: Secondary | ICD-10-CM | POA: Diagnosis not present

## 2017-11-26 DIAGNOSIS — F0390 Unspecified dementia without behavioral disturbance: Secondary | ICD-10-CM | POA: Diagnosis not present

## 2017-11-26 DIAGNOSIS — J449 Chronic obstructive pulmonary disease, unspecified: Secondary | ICD-10-CM | POA: Diagnosis not present

## 2017-11-26 DIAGNOSIS — I5032 Chronic diastolic (congestive) heart failure: Secondary | ICD-10-CM | POA: Diagnosis not present

## 2017-11-26 DIAGNOSIS — R64 Cachexia: Secondary | ICD-10-CM | POA: Diagnosis not present

## 2017-11-26 DIAGNOSIS — F419 Anxiety disorder, unspecified: Secondary | ICD-10-CM | POA: Diagnosis not present

## 2017-11-28 DIAGNOSIS — R64 Cachexia: Secondary | ICD-10-CM | POA: Diagnosis not present

## 2017-11-28 DIAGNOSIS — F0390 Unspecified dementia without behavioral disturbance: Secondary | ICD-10-CM | POA: Diagnosis not present

## 2017-11-28 DIAGNOSIS — F419 Anxiety disorder, unspecified: Secondary | ICD-10-CM | POA: Diagnosis not present

## 2017-11-28 DIAGNOSIS — I5032 Chronic diastolic (congestive) heart failure: Secondary | ICD-10-CM | POA: Diagnosis not present

## 2017-11-28 DIAGNOSIS — J449 Chronic obstructive pulmonary disease, unspecified: Secondary | ICD-10-CM | POA: Diagnosis not present

## 2017-11-30 DIAGNOSIS — J449 Chronic obstructive pulmonary disease, unspecified: Secondary | ICD-10-CM | POA: Diagnosis not present

## 2017-11-30 DIAGNOSIS — F0390 Unspecified dementia without behavioral disturbance: Secondary | ICD-10-CM | POA: Diagnosis not present

## 2017-11-30 DIAGNOSIS — F419 Anxiety disorder, unspecified: Secondary | ICD-10-CM | POA: Diagnosis not present

## 2017-11-30 DIAGNOSIS — R64 Cachexia: Secondary | ICD-10-CM | POA: Diagnosis not present

## 2017-11-30 DIAGNOSIS — I5032 Chronic diastolic (congestive) heart failure: Secondary | ICD-10-CM | POA: Diagnosis not present

## 2017-12-03 DIAGNOSIS — J449 Chronic obstructive pulmonary disease, unspecified: Secondary | ICD-10-CM | POA: Diagnosis not present

## 2017-12-03 DIAGNOSIS — I5032 Chronic diastolic (congestive) heart failure: Secondary | ICD-10-CM | POA: Diagnosis not present

## 2017-12-03 DIAGNOSIS — R64 Cachexia: Secondary | ICD-10-CM | POA: Diagnosis not present

## 2017-12-03 DIAGNOSIS — F419 Anxiety disorder, unspecified: Secondary | ICD-10-CM | POA: Diagnosis not present

## 2017-12-03 DIAGNOSIS — F0390 Unspecified dementia without behavioral disturbance: Secondary | ICD-10-CM | POA: Diagnosis not present

## 2017-12-05 ENCOUNTER — Other Ambulatory Visit: Payer: Self-pay | Admitting: Family Medicine

## 2017-12-05 DIAGNOSIS — F0390 Unspecified dementia without behavioral disturbance: Secondary | ICD-10-CM | POA: Diagnosis not present

## 2017-12-05 DIAGNOSIS — I5032 Chronic diastolic (congestive) heart failure: Secondary | ICD-10-CM | POA: Diagnosis not present

## 2017-12-05 DIAGNOSIS — R64 Cachexia: Secondary | ICD-10-CM | POA: Diagnosis not present

## 2017-12-05 DIAGNOSIS — J449 Chronic obstructive pulmonary disease, unspecified: Secondary | ICD-10-CM | POA: Diagnosis not present

## 2017-12-05 DIAGNOSIS — F419 Anxiety disorder, unspecified: Secondary | ICD-10-CM | POA: Diagnosis not present

## 2017-12-07 DIAGNOSIS — J449 Chronic obstructive pulmonary disease, unspecified: Secondary | ICD-10-CM | POA: Diagnosis not present

## 2017-12-07 DIAGNOSIS — F0390 Unspecified dementia without behavioral disturbance: Secondary | ICD-10-CM | POA: Diagnosis not present

## 2017-12-07 DIAGNOSIS — R64 Cachexia: Secondary | ICD-10-CM | POA: Diagnosis not present

## 2017-12-07 DIAGNOSIS — I5032 Chronic diastolic (congestive) heart failure: Secondary | ICD-10-CM | POA: Diagnosis not present

## 2017-12-07 DIAGNOSIS — F419 Anxiety disorder, unspecified: Secondary | ICD-10-CM | POA: Diagnosis not present

## 2017-12-10 ENCOUNTER — Other Ambulatory Visit: Payer: Self-pay | Admitting: Family Medicine

## 2017-12-10 DIAGNOSIS — R64 Cachexia: Secondary | ICD-10-CM | POA: Diagnosis not present

## 2017-12-10 DIAGNOSIS — J449 Chronic obstructive pulmonary disease, unspecified: Secondary | ICD-10-CM | POA: Diagnosis not present

## 2017-12-10 DIAGNOSIS — I5032 Chronic diastolic (congestive) heart failure: Secondary | ICD-10-CM | POA: Diagnosis not present

## 2017-12-10 DIAGNOSIS — F419 Anxiety disorder, unspecified: Secondary | ICD-10-CM | POA: Diagnosis not present

## 2017-12-10 DIAGNOSIS — F0390 Unspecified dementia without behavioral disturbance: Secondary | ICD-10-CM | POA: Diagnosis not present

## 2017-12-12 DIAGNOSIS — J449 Chronic obstructive pulmonary disease, unspecified: Secondary | ICD-10-CM | POA: Diagnosis not present

## 2017-12-12 DIAGNOSIS — F419 Anxiety disorder, unspecified: Secondary | ICD-10-CM | POA: Diagnosis not present

## 2017-12-12 DIAGNOSIS — R64 Cachexia: Secondary | ICD-10-CM | POA: Diagnosis not present

## 2017-12-12 DIAGNOSIS — I5032 Chronic diastolic (congestive) heart failure: Secondary | ICD-10-CM | POA: Diagnosis not present

## 2017-12-12 DIAGNOSIS — F0390 Unspecified dementia without behavioral disturbance: Secondary | ICD-10-CM | POA: Diagnosis not present

## 2017-12-14 DIAGNOSIS — I739 Peripheral vascular disease, unspecified: Secondary | ICD-10-CM | POA: Diagnosis not present

## 2017-12-14 DIAGNOSIS — R64 Cachexia: Secondary | ICD-10-CM | POA: Diagnosis not present

## 2017-12-14 DIAGNOSIS — M2012 Hallux valgus (acquired), left foot: Secondary | ICD-10-CM | POA: Diagnosis not present

## 2017-12-14 DIAGNOSIS — F0390 Unspecified dementia without behavioral disturbance: Secondary | ICD-10-CM | POA: Diagnosis not present

## 2017-12-14 DIAGNOSIS — M2041 Other hammer toe(s) (acquired), right foot: Secondary | ICD-10-CM | POA: Diagnosis not present

## 2017-12-14 DIAGNOSIS — F419 Anxiety disorder, unspecified: Secondary | ICD-10-CM | POA: Diagnosis not present

## 2017-12-14 DIAGNOSIS — M79675 Pain in left toe(s): Secondary | ICD-10-CM | POA: Diagnosis not present

## 2017-12-14 DIAGNOSIS — I5032 Chronic diastolic (congestive) heart failure: Secondary | ICD-10-CM | POA: Diagnosis not present

## 2017-12-14 DIAGNOSIS — B351 Tinea unguium: Secondary | ICD-10-CM | POA: Diagnosis not present

## 2017-12-14 DIAGNOSIS — J449 Chronic obstructive pulmonary disease, unspecified: Secondary | ICD-10-CM | POA: Diagnosis not present

## 2017-12-17 DIAGNOSIS — R64 Cachexia: Secondary | ICD-10-CM | POA: Diagnosis not present

## 2017-12-17 DIAGNOSIS — J449 Chronic obstructive pulmonary disease, unspecified: Secondary | ICD-10-CM | POA: Diagnosis not present

## 2017-12-17 DIAGNOSIS — F0390 Unspecified dementia without behavioral disturbance: Secondary | ICD-10-CM | POA: Diagnosis not present

## 2017-12-17 DIAGNOSIS — I5032 Chronic diastolic (congestive) heart failure: Secondary | ICD-10-CM | POA: Diagnosis not present

## 2017-12-17 DIAGNOSIS — F419 Anxiety disorder, unspecified: Secondary | ICD-10-CM | POA: Diagnosis not present

## 2017-12-19 DIAGNOSIS — J449 Chronic obstructive pulmonary disease, unspecified: Secondary | ICD-10-CM | POA: Diagnosis not present

## 2017-12-19 DIAGNOSIS — F0390 Unspecified dementia without behavioral disturbance: Secondary | ICD-10-CM | POA: Diagnosis not present

## 2017-12-19 DIAGNOSIS — F419 Anxiety disorder, unspecified: Secondary | ICD-10-CM | POA: Diagnosis not present

## 2017-12-19 DIAGNOSIS — I5032 Chronic diastolic (congestive) heart failure: Secondary | ICD-10-CM | POA: Diagnosis not present

## 2017-12-19 DIAGNOSIS — R64 Cachexia: Secondary | ICD-10-CM | POA: Diagnosis not present

## 2017-12-21 DIAGNOSIS — F0390 Unspecified dementia without behavioral disturbance: Secondary | ICD-10-CM | POA: Diagnosis not present

## 2017-12-21 DIAGNOSIS — J449 Chronic obstructive pulmonary disease, unspecified: Secondary | ICD-10-CM | POA: Diagnosis not present

## 2017-12-21 DIAGNOSIS — R64 Cachexia: Secondary | ICD-10-CM | POA: Diagnosis not present

## 2017-12-21 DIAGNOSIS — F419 Anxiety disorder, unspecified: Secondary | ICD-10-CM | POA: Diagnosis not present

## 2017-12-21 DIAGNOSIS — I5032 Chronic diastolic (congestive) heart failure: Secondary | ICD-10-CM | POA: Diagnosis not present

## 2017-12-22 ENCOUNTER — Inpatient Hospital Stay (HOSPITAL_COMMUNITY)
Admission: EM | Admit: 2017-12-22 | Discharge: 2017-12-28 | DRG: 100 | Disposition: A | Discharge status: Hospice | Payer: Medicare Other | Attending: Family Medicine | Admitting: Family Medicine

## 2017-12-22 DIAGNOSIS — J189 Pneumonia, unspecified organism: Secondary | ICD-10-CM

## 2017-12-22 DIAGNOSIS — Z811 Family history of alcohol abuse and dependence: Secondary | ICD-10-CM

## 2017-12-22 DIAGNOSIS — J440 Chronic obstructive pulmonary disease with acute lower respiratory infection: Secondary | ICD-10-CM | POA: Diagnosis present

## 2017-12-22 DIAGNOSIS — Z9049 Acquired absence of other specified parts of digestive tract: Secondary | ICD-10-CM

## 2017-12-22 DIAGNOSIS — R4182 Altered mental status, unspecified: Secondary | ICD-10-CM | POA: Diagnosis not present

## 2017-12-22 DIAGNOSIS — F0390 Unspecified dementia without behavioral disturbance: Secondary | ICD-10-CM | POA: Diagnosis not present

## 2017-12-22 DIAGNOSIS — Z8249 Family history of ischemic heart disease and other diseases of the circulatory system: Secondary | ICD-10-CM

## 2017-12-22 DIAGNOSIS — G40901 Epilepsy, unspecified, not intractable, with status epilepticus: Secondary | ICD-10-CM | POA: Diagnosis not present

## 2017-12-22 DIAGNOSIS — F419 Anxiety disorder, unspecified: Secondary | ICD-10-CM | POA: Diagnosis not present

## 2017-12-22 DIAGNOSIS — Z833 Family history of diabetes mellitus: Secondary | ICD-10-CM

## 2017-12-22 DIAGNOSIS — Z882 Allergy status to sulfonamides status: Secondary | ICD-10-CM

## 2017-12-22 DIAGNOSIS — R64 Cachexia: Secondary | ICD-10-CM | POA: Diagnosis not present

## 2017-12-22 DIAGNOSIS — Z818 Family history of other mental and behavioral disorders: Secondary | ICD-10-CM

## 2017-12-22 DIAGNOSIS — R062 Wheezing: Secondary | ICD-10-CM

## 2017-12-22 DIAGNOSIS — E785 Hyperlipidemia, unspecified: Secondary | ICD-10-CM | POA: Diagnosis present

## 2017-12-22 DIAGNOSIS — R569 Unspecified convulsions: Secondary | ICD-10-CM | POA: Diagnosis not present

## 2017-12-22 DIAGNOSIS — G309 Alzheimer's disease, unspecified: Secondary | ICD-10-CM | POA: Diagnosis present

## 2017-12-22 DIAGNOSIS — Z66 Do not resuscitate: Secondary | ICD-10-CM | POA: Diagnosis present

## 2017-12-22 DIAGNOSIS — Z880 Allergy status to penicillin: Secondary | ICD-10-CM

## 2017-12-22 DIAGNOSIS — R1310 Dysphagia, unspecified: Secondary | ICD-10-CM

## 2017-12-22 DIAGNOSIS — G9340 Encephalopathy, unspecified: Secondary | ICD-10-CM | POA: Diagnosis present

## 2017-12-22 DIAGNOSIS — Z953 Presence of xenogenic heart valve: Secondary | ICD-10-CM

## 2017-12-22 DIAGNOSIS — F418 Other specified anxiety disorders: Secondary | ICD-10-CM | POA: Diagnosis present

## 2017-12-22 DIAGNOSIS — J449 Chronic obstructive pulmonary disease, unspecified: Secondary | ICD-10-CM | POA: Diagnosis not present

## 2017-12-22 DIAGNOSIS — Z881 Allergy status to other antibiotic agents status: Secondary | ICD-10-CM

## 2017-12-22 DIAGNOSIS — J441 Chronic obstructive pulmonary disease with (acute) exacerbation: Secondary | ICD-10-CM | POA: Diagnosis not present

## 2017-12-22 DIAGNOSIS — I10 Essential (primary) hypertension: Secondary | ICD-10-CM | POA: Diagnosis present

## 2017-12-22 DIAGNOSIS — Z9981 Dependence on supplemental oxygen: Secondary | ICD-10-CM

## 2017-12-22 DIAGNOSIS — Z79899 Other long term (current) drug therapy: Secondary | ICD-10-CM

## 2017-12-22 DIAGNOSIS — F329 Major depressive disorder, single episode, unspecified: Secondary | ICD-10-CM | POA: Diagnosis present

## 2017-12-22 DIAGNOSIS — Z888 Allergy status to other drugs, medicaments and biological substances status: Secondary | ICD-10-CM

## 2017-12-22 DIAGNOSIS — Z8674 Personal history of sudden cardiac arrest: Secondary | ICD-10-CM

## 2017-12-22 DIAGNOSIS — Z681 Body mass index (BMI) 19 or less, adult: Secondary | ICD-10-CM

## 2017-12-22 DIAGNOSIS — E0590 Thyrotoxicosis, unspecified without thyrotoxic crisis or storm: Secondary | ICD-10-CM | POA: Diagnosis present

## 2017-12-22 DIAGNOSIS — I639 Cerebral infarction, unspecified: Secondary | ICD-10-CM | POA: Diagnosis present

## 2017-12-22 DIAGNOSIS — K2270 Barrett's esophagus without dysplasia: Secondary | ICD-10-CM | POA: Diagnosis present

## 2017-12-22 DIAGNOSIS — J181 Lobar pneumonia, unspecified organism: Secondary | ICD-10-CM

## 2017-12-22 DIAGNOSIS — F0280 Dementia in other diseases classified elsewhere without behavioral disturbance: Secondary | ICD-10-CM | POA: Diagnosis present

## 2017-12-22 DIAGNOSIS — Z823 Family history of stroke: Secondary | ICD-10-CM

## 2017-12-22 DIAGNOSIS — R634 Abnormal weight loss: Secondary | ICD-10-CM | POA: Diagnosis present

## 2017-12-22 DIAGNOSIS — Z87891 Personal history of nicotine dependence: Secondary | ICD-10-CM

## 2017-12-22 DIAGNOSIS — R627 Adult failure to thrive: Secondary | ICD-10-CM | POA: Diagnosis present

## 2017-12-22 DIAGNOSIS — I5032 Chronic diastolic (congestive) heart failure: Secondary | ICD-10-CM | POA: Diagnosis not present

## 2017-12-22 DIAGNOSIS — M810 Age-related osteoporosis without current pathological fracture: Secondary | ICD-10-CM | POA: Diagnosis present

## 2017-12-22 DIAGNOSIS — Z7982 Long term (current) use of aspirin: Secondary | ICD-10-CM

## 2017-12-22 HISTORY — DX: Unspecified convulsions: R56.9

## 2017-12-22 NOTE — ED Triage Notes (Addendum)
Family reports pt gazing to the left and shaking all over- episode last approx 2 minutes. After episode pt remains weak with head turned to the left and gaze to left. Family reports pt had shaking episode in the car on way that lasted approx  3 minutes. Pt is currently hospice pt- pt has DNR paperwork. At this time, pt actively having jerking episode involving eyes, twitching eyes/head, mouth drawing up/twitching.  Dr Laverta Baltimore made aware and at bedside to observe seizure like activity.

## 2017-12-23 ENCOUNTER — Emergency Department (HOSPITAL_COMMUNITY): Payer: Medicare Other

## 2017-12-23 ENCOUNTER — Encounter (HOSPITAL_COMMUNITY): Payer: Self-pay

## 2017-12-23 ENCOUNTER — Other Ambulatory Visit: Payer: Self-pay

## 2017-12-23 DIAGNOSIS — J189 Pneumonia, unspecified organism: Secondary | ICD-10-CM | POA: Diagnosis present

## 2017-12-23 DIAGNOSIS — I5032 Chronic diastolic (congestive) heart failure: Secondary | ICD-10-CM | POA: Diagnosis not present

## 2017-12-23 DIAGNOSIS — Z66 Do not resuscitate: Secondary | ICD-10-CM | POA: Diagnosis present

## 2017-12-23 DIAGNOSIS — I10 Essential (primary) hypertension: Secondary | ICD-10-CM | POA: Diagnosis present

## 2017-12-23 DIAGNOSIS — J449 Chronic obstructive pulmonary disease, unspecified: Secondary | ICD-10-CM | POA: Diagnosis not present

## 2017-12-23 DIAGNOSIS — R1310 Dysphagia, unspecified: Secondary | ICD-10-CM | POA: Diagnosis not present

## 2017-12-23 DIAGNOSIS — M810 Age-related osteoporosis without current pathological fracture: Secondary | ICD-10-CM | POA: Diagnosis present

## 2017-12-23 DIAGNOSIS — R569 Unspecified convulsions: Secondary | ICD-10-CM | POA: Diagnosis not present

## 2017-12-23 DIAGNOSIS — R627 Adult failure to thrive: Secondary | ICD-10-CM | POA: Diagnosis present

## 2017-12-23 DIAGNOSIS — F0280 Dementia in other diseases classified elsewhere without behavioral disturbance: Secondary | ICD-10-CM | POA: Diagnosis present

## 2017-12-23 DIAGNOSIS — Z880 Allergy status to penicillin: Secondary | ICD-10-CM | POA: Diagnosis not present

## 2017-12-23 DIAGNOSIS — Z515 Encounter for palliative care: Secondary | ICD-10-CM | POA: Diagnosis not present

## 2017-12-23 DIAGNOSIS — G40901 Epilepsy, unspecified, not intractable, with status epilepticus: Secondary | ICD-10-CM | POA: Diagnosis not present

## 2017-12-23 DIAGNOSIS — J440 Chronic obstructive pulmonary disease with acute lower respiratory infection: Secondary | ICD-10-CM | POA: Diagnosis present

## 2017-12-23 DIAGNOSIS — J181 Lobar pneumonia, unspecified organism: Secondary | ICD-10-CM | POA: Diagnosis not present

## 2017-12-23 DIAGNOSIS — Z881 Allergy status to other antibiotic agents status: Secondary | ICD-10-CM | POA: Diagnosis not present

## 2017-12-23 DIAGNOSIS — F329 Major depressive disorder, single episode, unspecified: Secondary | ICD-10-CM | POA: Diagnosis present

## 2017-12-23 DIAGNOSIS — G309 Alzheimer's disease, unspecified: Secondary | ICD-10-CM | POA: Diagnosis present

## 2017-12-23 DIAGNOSIS — G300 Alzheimer's disease with early onset: Secondary | ICD-10-CM | POA: Diagnosis not present

## 2017-12-23 DIAGNOSIS — Z87891 Personal history of nicotine dependence: Secondary | ICD-10-CM | POA: Diagnosis not present

## 2017-12-23 DIAGNOSIS — Z681 Body mass index (BMI) 19 or less, adult: Secondary | ICD-10-CM | POA: Diagnosis not present

## 2017-12-23 DIAGNOSIS — I639 Cerebral infarction, unspecified: Secondary | ICD-10-CM | POA: Diagnosis present

## 2017-12-23 DIAGNOSIS — R64 Cachexia: Secondary | ICD-10-CM | POA: Diagnosis not present

## 2017-12-23 DIAGNOSIS — E785 Hyperlipidemia, unspecified: Secondary | ICD-10-CM | POA: Diagnosis present

## 2017-12-23 DIAGNOSIS — J441 Chronic obstructive pulmonary disease with (acute) exacerbation: Secondary | ICD-10-CM | POA: Diagnosis present

## 2017-12-23 DIAGNOSIS — R634 Abnormal weight loss: Secondary | ICD-10-CM | POA: Diagnosis present

## 2017-12-23 DIAGNOSIS — R918 Other nonspecific abnormal finding of lung field: Secondary | ICD-10-CM | POA: Diagnosis not present

## 2017-12-23 DIAGNOSIS — E0590 Thyrotoxicosis, unspecified without thyrotoxic crisis or storm: Secondary | ICD-10-CM | POA: Diagnosis present

## 2017-12-23 DIAGNOSIS — K2270 Barrett's esophagus without dysplasia: Secondary | ICD-10-CM | POA: Diagnosis present

## 2017-12-23 DIAGNOSIS — Z7189 Other specified counseling: Secondary | ICD-10-CM | POA: Diagnosis not present

## 2017-12-23 DIAGNOSIS — Z9049 Acquired absence of other specified parts of digestive tract: Secondary | ICD-10-CM | POA: Diagnosis not present

## 2017-12-23 DIAGNOSIS — F418 Other specified anxiety disorders: Secondary | ICD-10-CM | POA: Diagnosis present

## 2017-12-23 DIAGNOSIS — Z953 Presence of xenogenic heart valve: Secondary | ICD-10-CM | POA: Diagnosis not present

## 2017-12-23 DIAGNOSIS — F419 Anxiety disorder, unspecified: Secondary | ICD-10-CM | POA: Diagnosis not present

## 2017-12-23 DIAGNOSIS — R4182 Altered mental status, unspecified: Secondary | ICD-10-CM | POA: Diagnosis not present

## 2017-12-23 DIAGNOSIS — G9340 Encephalopathy, unspecified: Secondary | ICD-10-CM | POA: Diagnosis present

## 2017-12-23 DIAGNOSIS — F0390 Unspecified dementia without behavioral disturbance: Secondary | ICD-10-CM | POA: Diagnosis not present

## 2017-12-23 LAB — I-STAT TROPONIN, ED: Troponin i, poc: 0 ng/mL (ref 0.00–0.08)

## 2017-12-23 LAB — CBC
HEMATOCRIT: 41.7 % (ref 36.0–46.0)
Hemoglobin: 12.6 g/dL (ref 12.0–15.0)
MCH: 27.8 pg (ref 26.0–34.0)
MCHC: 30.2 g/dL (ref 30.0–36.0)
MCV: 92.1 fL (ref 80.0–100.0)
Platelets: 357 10*3/uL (ref 150–400)
RBC: 4.53 MIL/uL (ref 3.87–5.11)
RDW: 14 % (ref 11.5–15.5)
WBC: 13.2 10*3/uL — AB (ref 4.0–10.5)
nRBC: 0 % (ref 0.0–0.2)

## 2017-12-23 LAB — DIFFERENTIAL
Abs Immature Granulocytes: 0.03 10*3/uL (ref 0.00–0.07)
Basophils Absolute: 0.1 10*3/uL (ref 0.0–0.1)
Basophils Relative: 1 %
EOS PCT: 3 %
Eosinophils Absolute: 0.4 10*3/uL (ref 0.0–0.5)
Immature Granulocytes: 0 %
LYMPHS ABS: 1.2 10*3/uL (ref 0.7–4.0)
LYMPHS PCT: 9 %
MONO ABS: 0.7 10*3/uL (ref 0.1–1.0)
Monocytes Relative: 6 %
Neutro Abs: 10.8 10*3/uL — ABNORMAL HIGH (ref 1.7–7.7)
Neutrophils Relative %: 81 %

## 2017-12-23 LAB — RAPID URINE DRUG SCREEN, HOSP PERFORMED
AMPHETAMINES: NOT DETECTED
BARBITURATES: NOT DETECTED
Benzodiazepines: POSITIVE — AB
COCAINE: NOT DETECTED
Opiates: NOT DETECTED
Tetrahydrocannabinol: NOT DETECTED

## 2017-12-23 LAB — URINALYSIS, ROUTINE W REFLEX MICROSCOPIC
Bilirubin Urine: NEGATIVE
GLUCOSE, UA: NEGATIVE mg/dL
KETONES UR: NEGATIVE mg/dL
Nitrite: NEGATIVE
PH: 5 (ref 5.0–8.0)
PROTEIN: NEGATIVE mg/dL
Specific Gravity, Urine: 1.018 (ref 1.005–1.030)

## 2017-12-23 LAB — COMPREHENSIVE METABOLIC PANEL
ALK PHOS: 102 U/L (ref 38–126)
ALT: 11 U/L (ref 0–44)
ANION GAP: 9 (ref 5–15)
AST: 18 U/L (ref 15–41)
Albumin: 3.6 g/dL (ref 3.5–5.0)
BILIRUBIN TOTAL: 0.7 mg/dL (ref 0.3–1.2)
BUN: 12 mg/dL (ref 8–23)
CO2: 25 mmol/L (ref 22–32)
CREATININE: 0.7 mg/dL (ref 0.44–1.00)
Calcium: 9 mg/dL (ref 8.9–10.3)
Chloride: 104 mmol/L (ref 98–111)
GFR calc non Af Amer: >60 mL/min (ref >60)
GLUCOSE: 159 mg/dL — AB (ref 70–99)
Potassium: 3.8 mmol/L (ref 3.5–5.1)
Sodium: 138 mmol/L (ref 135–145)
Total Protein: 7 g/dL (ref 6.5–8.1)

## 2017-12-23 LAB — I-STAT CHEM 8, ED
BUN: 13 mg/dL (ref 8–23)
CALCIUM ION: 1.22 mmol/L (ref 1.15–1.40)
CHLORIDE: 104 mmol/L (ref 98–111)
Creatinine, Ser: 0.7 mg/dL (ref 0.44–1.00)
Glucose, Bld: 156 mg/dL — ABNORMAL HIGH (ref 70–99)
HEMATOCRIT: 40 % (ref 36.0–46.0)
Hemoglobin: 13.6 g/dL (ref 12.0–15.0)
Potassium: 4 mmol/L (ref 3.5–5.1)
Sodium: 140 mmol/L (ref 135–145)
TCO2: 27 mmol/L (ref 22–32)

## 2017-12-23 LAB — PROTIME-INR
INR: 0.97
PROTHROMBIN TIME: 12.8 s (ref 11.4–15.2)

## 2017-12-23 LAB — APTT: aPTT: 28 seconds (ref 24–36)

## 2017-12-23 LAB — ETHANOL

## 2017-12-23 MED ORDER — STROKE: EARLY STAGES OF RECOVERY BOOK
Status: AC
  Filled 2017-12-23: qty 1

## 2017-12-23 MED ORDER — ALBUTEROL SULFATE (2.5 MG/3ML) 0.083% IN NEBU: 2.5000 mg | INHALATION_SOLUTION | Freq: Three times a day (TID) | RESPIRATORY_TRACT | Status: DC

## 2017-12-23 MED ORDER — ONDANSETRON HCL 4 MG PO TABS: 4.0000 mg | ORAL_TABLET | Freq: Four times a day (QID) | ORAL | Status: DC | PRN

## 2017-12-23 MED ORDER — ORAL CARE MOUTH RINSE
15.0000 mL | Freq: Two times a day (BID) | OROMUCOSAL | Status: DC
  Administered 2017-12-24 – 2017-12-27 (×6): 15 mL via OROMUCOSAL

## 2017-12-23 MED ORDER — LEVETIRACETAM IN NACL 500 MG/100ML IV SOLN
500.0000 mg | Freq: Two times a day (BID) | INTRAVENOUS | Status: DC
  Administered 2017-12-23 (×2): 500 mg via INTRAVENOUS
  Filled 2017-12-23 (×2): qty 100

## 2017-12-23 MED ORDER — ENOXAPARIN SODIUM 40 MG/0.4ML ~~LOC~~ SOLN
40.0000 mg | SUBCUTANEOUS | Status: DC
  Administered 2017-12-23: 40 mg via SUBCUTANEOUS
  Filled 2017-12-23 (×2): qty 0.4

## 2017-12-23 MED ORDER — SODIUM CHLORIDE 0.9% FLUSH
3.0000 mL | Freq: Two times a day (BID) | INTRAVENOUS | Status: DC
  Administered 2017-12-23 – 2017-12-28 (×3): 3 mL via INTRAVENOUS

## 2017-12-23 MED ORDER — LORAZEPAM 2 MG/ML IJ SOLN
0.5000 mg | INTRAMUSCULAR | Status: DC | PRN
  Administered 2017-12-23: 0.5 mg via INTRAVENOUS
  Filled 2017-12-23: qty 1

## 2017-12-23 MED ORDER — SODIUM CHLORIDE 0.9 % IV SOLN
INTRAVENOUS | Status: DC
  Administered 2017-12-23: 04:00:00 via INTRAVENOUS

## 2017-12-23 MED ORDER — CHLORHEXIDINE GLUCONATE 0.12 % MT SOLN
15.0000 mL | Freq: Two times a day (BID) | OROMUCOSAL | Status: DC
  Administered 2017-12-23 – 2017-12-28 (×10): 15 mL via OROMUCOSAL
  Filled 2017-12-23 (×9): qty 15

## 2017-12-23 MED ORDER — LEVETIRACETAM IN NACL 1000 MG/100ML IV SOLN
1000.0000 mg | Freq: Once | INTRAVENOUS | Status: AC
  Administered 2017-12-23: 1000 mg via INTRAVENOUS
  Filled 2017-12-23: qty 100

## 2017-12-23 MED ORDER — LORAZEPAM 2 MG/ML IJ SOLN
1.0000 mg | Freq: Once | INTRAMUSCULAR | Status: AC
  Administered 2017-12-23: 1 mg via INTRAVENOUS

## 2017-12-23 MED ORDER — MIRTAZAPINE 15 MG PO TABS
7.5000 mg | ORAL_TABLET | Freq: Every day | ORAL | Status: DC
  Administered 2017-12-25 – 2017-12-27 (×3): 7.5 mg via ORAL
  Filled 2017-12-23 (×3): qty 1

## 2017-12-23 MED ORDER — LORAZEPAM 2 MG/ML IJ SOLN
INTRAMUSCULAR | Status: AC
  Administered 2017-12-23: 1 mg via INTRAVENOUS
  Filled 2017-12-23: qty 1

## 2017-12-23 MED ORDER — FAMOTIDINE 20 MG PO TABS
20.0000 mg | ORAL_TABLET | Freq: Every day | ORAL | Status: DC
  Administered 2017-12-25 – 2017-12-27 (×3): 20 mg via ORAL
  Filled 2017-12-23 (×3): qty 1

## 2017-12-23 MED ORDER — STROKE: EARLY STAGES OF RECOVERY BOOK
Freq: Once | Status: AC
  Administered 2017-12-23: 04:00:00
  Filled 2017-12-23: qty 1

## 2017-12-23 MED ORDER — SODIUM CHLORIDE 0.9 % IV SOLN
INTRAVENOUS | Status: AC
  Administered 2017-12-23: 20:00:00 via INTRAVENOUS

## 2017-12-23 MED ORDER — TIOTROPIUM BROMIDE MONOHYDRATE 18 MCG IN CAPS
18.0000 ug | ORAL_CAPSULE | Freq: Every day | RESPIRATORY_TRACT | Status: DC
  Filled 2017-12-23: qty 5

## 2017-12-23 MED ORDER — PANTOPRAZOLE SODIUM 40 MG PO TBEC
40.0000 mg | DELAYED_RELEASE_TABLET | Freq: Every day | ORAL | Status: DC
  Administered 2017-12-26 – 2017-12-28 (×3): 40 mg via ORAL
  Filled 2017-12-23 (×4): qty 1

## 2017-12-23 MED ORDER — SERTRALINE HCL 50 MG PO TABS
25.0000 mg | ORAL_TABLET | Freq: Every day | ORAL | Status: DC
  Administered 2017-12-26 – 2017-12-28 (×3): 25 mg via ORAL
  Filled 2017-12-23 (×5): qty 1

## 2017-12-23 MED ORDER — ALBUTEROL SULFATE (2.5 MG/3ML) 0.083% IN NEBU
2.5000 mg | INHALATION_SOLUTION | Freq: Three times a day (TID) | RESPIRATORY_TRACT | Status: DC
  Administered 2017-12-23 (×3): 2.5 mg via RESPIRATORY_TRACT
  Filled 2017-12-23 (×3): qty 3

## 2017-12-23 MED ORDER — ALBUTEROL SULFATE (2.5 MG/3ML) 0.083% IN NEBU
2.5000 mg | INHALATION_SOLUTION | Freq: Three times a day (TID) | RESPIRATORY_TRACT | Status: DC
  Administered 2017-12-23: 2.5 mg via RESPIRATORY_TRACT
  Filled 2017-12-23: qty 3

## 2017-12-23 MED ORDER — DONEPEZIL HCL 5 MG PO TABS
10.0000 mg | ORAL_TABLET | Freq: Every day | ORAL | Status: DC
  Administered 2017-12-25 – 2017-12-27 (×3): 10 mg via ORAL
  Filled 2017-12-23 (×2): qty 2
  Filled 2017-12-23: qty 1
  Filled 2017-12-23: qty 2

## 2017-12-23 MED ORDER — LORAZEPAM 2 MG/ML IJ SOLN: 1.0000 mg | INTRAMUSCULAR | Status: DC | PRN

## 2017-12-23 MED ORDER — ONDANSETRON HCL 4 MG/2ML IJ SOLN: 4.0000 mg | Freq: Four times a day (QID) | INTRAMUSCULAR | Status: DC | PRN

## 2017-12-23 MED ORDER — ACETAMINOPHEN 325 MG PO TABS: 650.0000 mg | ORAL_TABLET | Freq: Four times a day (QID) | ORAL | Status: DC | PRN

## 2017-12-23 MED ORDER — ACETAMINOPHEN 650 MG RE SUPP: 650.0000 mg | Freq: Four times a day (QID) | RECTAL | Status: DC | PRN

## 2017-12-23 MED ORDER — ASPIRIN EC 81 MG PO TBEC
81.0000 mg | DELAYED_RELEASE_TABLET | Freq: Every day | ORAL | Status: DC
  Administered 2017-12-26 – 2017-12-28 (×3): 81 mg via ORAL
  Filled 2017-12-23 (×4): qty 1

## 2017-12-23 NOTE — ED Notes (Addendum)
Unable to collect urine sample. No urine in bladder

## 2017-12-23 NOTE — Progress Notes (Signed)
Patient has not voided today.  Bladder scan reveals approximately 250 ml.  Dr. Wynetta Emery notified.

## 2017-12-23 NOTE — Progress Notes (Addendum)
12/22/2017 11:54 PM  12/23/2017 9:12 AM  [name] was seen and examined.  The H&P by the admitting provider, orders, imaging was reviewed.  Requesting palliative medicine consult per family request.  Seizure precautions.  Continue IV keppra.  Obtain SLP evaluation.  Will continue to follow. I was able to update patient's family when they arrived today for visit.    Vitals:   12/23/17 0815 12/23/17 0903  BP:  (!) 144/68  Pulse:  76  Resp:  16  Temp:    SpO2: 98% 99%   Results for orders placed or performed during the hospital encounter of 12/22/17  Ethanol  Result Value Ref Range   Alcohol, Ethyl (B) <10 <10 mg/dL  Protime-INR  Result Value Ref Range   Prothrombin Time 12.8 11.4 - 15.2 seconds   INR 0.97   APTT  Result Value Ref Range   aPTT 28 24 - 36 seconds  CBC  Result Value Ref Range   WBC 13.2 (H) 4.0 - 10.5 K/uL   RBC 4.53 3.87 - 5.11 MIL/uL   Hemoglobin 12.6 12.0 - 15.0 g/dL   HCT 41.7 36.0 - 46.0 %   MCV 92.1 80.0 - 100.0 fL   MCH 27.8 26.0 - 34.0 pg   MCHC 30.2 30.0 - 36.0 g/dL   RDW 14.0 11.5 - 15.5 %   Platelets 357 150 - 400 K/uL   nRBC 0.0 0.0 - 0.2 %  Differential  Result Value Ref Range   Neutrophils Relative % 81 %   Neutro Abs 10.8 (H) 1.7 - 7.7 K/uL   Lymphocytes Relative 9 %   Lymphs Abs 1.2 0.7 - 4.0 K/uL   Monocytes Relative 6 %   Monocytes Absolute 0.7 0.1 - 1.0 K/uL   Eosinophils Relative 3 %   Eosinophils Absolute 0.4 0.0 - 0.5 K/uL   Basophils Relative 1 %   Basophils Absolute 0.1 0.0 - 0.1 K/uL   Immature Granulocytes 0 %   Abs Immature Granulocytes 0.03 0.00 - 0.07 K/uL  Comprehensive metabolic panel  Result Value Ref Range   Sodium 138 135 - 145 mmol/L   Potassium 3.8 3.5 - 5.1 mmol/L   Chloride 104 98 - 111 mmol/L   CO2 25 22 - 32 mmol/L   Glucose, Bld 159 (H) 70 - 99 mg/dL   BUN 12 8 - 23 mg/dL   Creatinine, Ser 0.70 0.44 - 1.00 mg/dL   Calcium 9.0 8.9 - 10.3 mg/dL   Total Protein 7.0 6.5 - 8.1 g/dL   Albumin 3.6 3.5 - 5.0  g/dL   AST 18 15 - 41 U/L   ALT 11 0 - 44 U/L   Alkaline Phosphatase 102 38 - 126 U/L   Total Bilirubin 0.7 0.3 - 1.2 mg/dL   GFR calc non Af Amer >60 >60 mL/min   GFR calc Af Amer >60 >60 mL/min   Anion gap 9 5 - 15  I-Stat Chem 8, ED  Result Value Ref Range   Sodium 140 135 - 145 mmol/L   Potassium 4.0 3.5 - 5.1 mmol/L   Chloride 104 98 - 111 mmol/L   BUN 13 8 - 23 mg/dL   Creatinine, Ser 0.70 0.44 - 1.00 mg/dL   Glucose, Bld 156 (H) 70 - 99 mg/dL   Calcium, Ion 1.22 1.15 - 1.40 mmol/L   TCO2 27 22 - 32 mmol/L   Hemoglobin 13.6 12.0 - 15.0 g/dL   HCT 40.0 36.0 - 46.0 %  I-stat troponin, ED  Result Value Ref Range   Troponin i, poc 0.00 0.00 - 0.08 ng/mL   Comment 3             Murvin Natal, MD [facility]

## 2017-12-23 NOTE — Consult Note (Signed)
   [facility] Tele[facility]urology Consult Services  Date of Service: 12/23/2017  Impression:  1. New onset seizures 2. Subacute stroke, possibly bilateral  Recommendations:  1. Noting she has been given a loading dose of Keppra, I would continue 500 mg every 12 hours. 2. Patient is a hospice patient with DNR in place. I discussed with her son that he wishes no extraordinary measures including no intubation. He is comfortable with [place] making her comfortable and medicating her for seizures. In-house neurology should follow 3. I would consider a contrast CT to rule out metastatic disease  ---------------------------------------------------------------------  CC: seizures  History of Present Illness: this is a 77 year old woman who was a hospice patient. She has significant weight loss probably failure to thrive. She has dementia. She is had three episodes of loss of consciousness, shaking of her hands, head turning to the left side shaking in with her eyes rolled back. Normally she's able to walk, feed herself, and converse. She has a history of hypertension. Initial blood pressure 169/62, dropping to 82/40, spontaneously returning to 122/69. She has no history of diabetes. She has coronary disease and has had bypass surgery as well as a valve replacement. She had a cardiac arrest once. She has no history of hyperlipidemia. She was a heavy smoker. She takes [place] but no anticoagulation.   Diagnostic Testing: CT brain reveals probable subacute infarctions in the anterior right frontal lobe, high midline posterior right frontal lobe, right posterior temporal lobe, left frontal lobe. These are consistent possibly with a watershed infarct on the right side. There may be embolic. Metastatic disease is not ruled out.  Exam:  Mental Status:  Awake, unresponsive, best responses moving around painful stimulation.   Cranial Nerves: Pupils: equal round and reactive to light Extraocular movements: forced  deviation to the left Ptosis: Absent  Motor Exam:  spontaneously and reflexively moving all four extremities    Sensation: she responds to noxious stimulation bilaterally    Medical Decision Making:  - Extensive number of diagnosis or management options are considered above.   - Extensive amount of complex data reviewed.   - High risk of complication and/or morbidity or mortality are associated with differential diagnostic considerations above.  - There may be uncertain outcome and increased probability of prolonged functional impairment or high probability of severe prolonged functional impairment associated with some of these differential diagnosis.   Medical Data Reviewed:  1.Data reviewed include clinical labs, radiology,  Medical Tests;   2.Tests results discussed w/performing or interpreting physician;   3.Obtaining/reviewing old medical records;  4.Obtaining case history from another source;  5.Independent review of image, tracing or specimen.    Patient was informed the Neurology Consult would happen via TeleHealth consult by way of interactive audio and video telecommunications and consented to receiving care in this manner.

## 2017-12-23 NOTE — Progress Notes (Signed)
Patient voided 100 ml in purewick and incontinent pad wet and changed.  Will continue to monitor.

## 2017-12-23 NOTE — Progress Notes (Signed)
Pt with seizure like activity lasting approximately 2 mins. IV ativan given. Pt now alert with eyes open and moaning out. BM during this time. Will continue to monitor.

## 2017-12-23 NOTE — ED Notes (Signed)
Dr Wyvonnia Dusky at bedside- pt still having seizure like activity.

## 2017-12-23 NOTE — Progress Notes (Signed)
SLP Cancellation Note  Patient Details Name: [name] MRN: [id number] DOB: Jul 13, 1940   Cancelled treatment:       Reason Eval/Treat Not Completed: Fatigue/lethargy limiting ability to participate. ST to re-attempt tomorrow.  Amelia H. Roddie Mc, CCC-SLP Speech Language Pathologist    Wende Bushy 12/23/2017, 1:06 PM

## 2017-12-23 NOTE — ED Provider Notes (Addendum)
[name] EMERGENCY DEPARTMENT Provider Note   CSN: [id number] Arrival date & time: 12/22/17  2345     History   Chief Complaint Chief Complaint  Patient presents with  . Altered Mental Status    ?seizure/CVA    HPI [name] is a 77 y.o. female.  Level 5 caveat for dementia and acuity condition.  Patient in hospice for apparent dementia and COPD on oxygen presenting from home with altered mental status and possible seizure-like activity.  Son states he went on to check on her around 9 PM and found her to have her "eyes rolled back in her head with contractures of her left arm, left-sided gaze and twitching of her left side.  This lasted for about 3 minutes.  She had a second episode in the car that lasted several minutes as well.  This resolved on arrival to the ED but then she began to have jerking again of her left side with left-sided gaze and twitching of her left face and left arm.  Family reports she was diagnosed with questionable seizures in the past but is not any medication for them.  She has not had any recent fevers or illnesses.  Denies any alcohol or benzodiazepine use regularly.  Patient does have a DNR in place and does not want to be on life support.  She is in hospice for COPD and dementia.  The history is provided by the patient and the spouse. The history is limited by the condition of the patient.  Altered Mental Status      Past Medical History:  Diagnosis Date  . Anxiety   . Aortic stenosis    Status post bioprosthetic AVR 2004  . Arthritis   . COPD with chronic bronchitis ([place])   . Dementia ([place])   . Depression   . Essential hypertension   . History of hyperthyroidism 2012   Status post ablation  . Hyperlipidemia   . Osteoporosis   . Renal insufficiency   . Rheumatic fever   . Sebaceous cyst   . Seizures ([place])   . Urosepsis 2012   Recurrent    Patient Active Problem List   Diagnosis Date Noted  . Osteoarthritis of both shoulders  09/23/2016  . Supplemental oxygen dependent 07/02/2016  . Reduced vision 04/29/2015  . Allergic rhinitis 12/29/2014  . GERD (gastroesophageal reflux disease) 10/18/2014  . Protein-calorie malnutrition, severe ([place]) 10/29/2013  . Cerebellar stroke ([place]) 10/29/2013  . Fall at home 10/28/2013  . Dementia without behavioral disturbance ([place]) 09/08/2012  . Underweight 07/05/2011  . Lung nodule 02/16/2010  . IMPAIRED FASTING GLUCOSE 07/12/2009  . Depression with anxiety 09/09/2008  . AORTIC VALVE DISEASE 08/10/2008  . Insomnia secondary to depression with anxiety 07/27/2008  . OTHER OSTEOPOROSIS 06/09/2008  . UNSPECIFIED DISORDER OF THYROID 06/20/2007  . Essential hypertension, benign 03/15/2007  . Diastolic CHF, chronic ([place]) 03/15/2007  . COPD GOLD III with reversibility  03/15/2007    Past Surgical History:  Procedure Laterality Date  . AORTIC VALVE REPLACEMENT     58m porcine 10/04  . APPENDECTOMY    . BREAST LUMPECTOMY    . CARDIAC VALVE REPLACEMENT    . CHOLECYSTECTOMY    . COLON SURGERY  2005  . COLONOSCOPY  07/23/2008    Single diminutive polyp in the rectum status post cold biopsy removal/ Status post right hemicolectomy/ Colonic ulcers in the upstream end the colon distal to the  anastomosis as described above.  Status post biopsy. The anastomosis  itself and neoterminal ileum appeared normal  . ESOPHAGOGASTRODUODENOSCOPY  10/12/09   short segment Barrett's esophage/small hiatal hernia,antral ersions  . LUMBAR LAMINECTOMY    . TONSILLECTOMY       OB History   None      Home Medications    Prior to Admission medications   Medication Sig Start Date End Date Taking? Authorizing Provider  albuterol (PROVENTIL) (2.5 MG/3ML) 0.083% nebulizer solution TAKE 3 MLS (2.5 MG TOTAL) BY NEBULIZATION 3 (THREE) TIMES DAILY. 03/21/17   Fayrene Helper, MD  aspirin 81 MG tablet Take 1 tablet (81 mg total) by mouth daily. 11/01/13   Kathie Dike, MD  donepezil (ARICEPT) 10 MG  tablet TAKE 1 TABLET BY MOUTH AT BEDTIME. 08/03/17   Fayrene Helper, MD  famotidine (PEPCID) 20 MG tablet Take 20 mg by mouth at bedtime.    [provider]  lansoprazole (PREVACID) 15 MG capsule Take twocapsules 30 minutes before food 06/29/16   Fayrene Helper, MD  losartan (COZAAR) 25 MG tablet Take 0.5 tablets (12.5 mg total) by mouth daily. 10/12/16   Satira Sark, MD  losartan (COZAAR) 25 MG tablet TAKE 1 TABLET EVERY DAY AS NEEDED 11/01/17   Fayrene Helper, MD  mirtazapine (REMERON) 7.5 MG tablet TAKE 1 TABLET BY MOUTH EVERYDAY AT BEDTIME 12/11/17   Fayrene Helper, MD  sertraline (ZOLOFT) 25 MG tablet TAKE 1 TABLET BY MOUTH EVERY DAY 11/22/17   Fayrene Helper, MD  tiotropium (SPIRIVA HANDIHALER) 18 MCG inhalation capsule INHALE 1 CAPSULE WITH HANDIHALER AT THE SAME TIME EVERY DAY 12/06/17   Fayrene Helper, MD  UNABLE TO FIND Nebulizer kit and tubing. (please give child-size tubing) DX: J44.9 02/22/17   Fayrene Helper, MD    Family History Family History  Problem Relation Age of Onset  . Heart attack Mother 29  . Stroke Father 25  . Hypertension Sister   . Depression Sister   . Alcohol abuse Sister   . Diabetes Son     Social History Social History   Tobacco Use  . Smoking status: Former Smoker    Packs/day: 0.25    Years: 50.00    Pack years: 12.50    Types: Cigarettes    Last attempt to quit: 04/30/2012    Years since quitting: 5.6  . Smokeless tobacco: Never Used  Substance Use Topics  . Alcohol use: No    Alcohol/week: 0.0 standard drinks  . Drug use: No     Allergies   Levofloxacin; Nitrofurantoin; Bupropion hcl; Cyclobenzaprine hcl; Gadolinium; Lorazepam; Penicillins; Statins; and Sulfonamide derivatives   Review of Systems Review of Systems  Unable to perform ROS: Acuity of condition     Physical Exam Updated Vital Signs BP (!) 161/79 (BP Location: Right Arm)   Pulse (!) 102   Temp (!) 97.5 F (36.4 C)  (Axillary)   Resp (!) 29   Wt 40.8 kg   SpO2 100%   BMI 15.94 kg/m   Physical Exam  Constitutional: No distress.  Obtunded, actively seizing with left facial twitching and twitching of her left arm and left-sided gaze  HENT:  Head: Normocephalic and atraumatic.  Mouth/Throat: Oropharynx is clear and moist. No oropharyngeal exudate.  Eyes: Pupils are equal, round, and reactive to light. Conjunctivae and EOM are normal.  Neck: Normal range of motion. Neck supple.  No meningismus.  Cardiovascular: Normal rate, regular rhythm, normal heart sounds and intact distal pulses.  No murmur heard. Pulmonary/Chest: Effort normal  and breath sounds normal. No respiratory distress. She exhibits no tenderness.  Abdominal: Soft. There is no tenderness. There is no rebound and no guarding.  Musculoskeletal: Normal range of motion. She exhibits no edema or tenderness.  Neurological: A cranial nerve deficit is present. She exhibits normal muscle tone. Coordination normal.  Obtunded, unable to participate in exam.  Left-sided gaze with twitching of face and left upper extremity  Skin: Skin is warm. Capillary refill takes less than 2 seconds. No pallor.  Nursing note and vitals reviewed.    ED Treatments / Results  Labs (all labs ordered are listed, but only abnormal results are displayed) Labs Reviewed  CBC - Abnormal; Notable for the following components:      Result Value   WBC 13.2 (*)    All other components within normal limits  DIFFERENTIAL - Abnormal; Notable for the following components:   Neutro Abs 10.8 (*)    All other components within normal limits  COMPREHENSIVE METABOLIC PANEL - Abnormal; Notable for the following components:   Glucose, Bld 159 (*)    All other components within normal limits  I-STAT CHEM 8, ED - Abnormal; Notable for the following components:   Glucose, Bld 156 (*)    All other components within normal limits  ETHANOL  PROTIME-INR  APTT  RAPID URINE DRUG  SCREEN, HOSP PERFORMED  URINALYSIS, ROUTINE W REFLEX MICROSCOPIC  I-STAT TROPONIN, ED    EKG EKG Interpretation  Date/Time:  Sunday December 23 2017 00:05:11 EDT Ventricular Rate:  102 PR Interval:    QRS Duration: 89 QT Interval:  367 QTC Calculation: 479 R Axis:   73 Text Interpretation:  Normal sinus rhythm Consider left ventricular hypertrophy Anterior Q waves, possibly due to LVH Rate faster Confirmed by Ezequiel Essex [phone]) on 12/23/2017 1:52:02 AM   Radiology Ct Head Code Stroke Wo Contrast  Result Date: 12/23/2017 CLINICAL DATA:  Code stroke. Seizures. History of hypertension and dementia. EXAM: CT HEAD WITHOUT CONTRAST TECHNIQUE: Contiguous axial images were obtained from the base of the skull through the vertex without intravenous contrast. COMPARISON:  MRI head October 29, 2013 FINDINGS: Moderately motion degraded examination. BRAIN: New multifocal RIGHT frontotemporal focal blurring of the gray-white matter junction with subcortical edema. Small area RIGHT parietal encephalomalacia. Confluent supratentorial white matter hypodensities. Old small cerebellar infarcts. No acute large vascular territory infarcts. No abnormal extra-axial fluid collections. Basal cisterns are patent. 12 mm LEFT posterior fossa calcified meningioma without mass effect, unchanged. VASCULAR: Moderate calcific atherosclerosis of the carotid siphons. SKULL: No skull fracture. No significant scalp soft tissue swelling. SINUSES/ORBITS: Severe chronic LEFT maxillary sinusitis. Mastoid air cells are well aerated. Soft tissue RIGHT external auditory canal most compatible with cerumen.The included ocular globes and orbital contents are non-suspicious. Status post bilateral ocular lens implants. OTHER: None. IMPRESSION: 1. Motion degraded examination. Multifocal small RIGHT MCA and anterior watershed territory subacute infarcts, less likely metastasis or cerebritis. MRI of the brain with contrast would better  characterize. 2. Moderate to severe chronic small vessel ischemic changes. Old small cerebellar infarcts. 3. Critical Value/emergent results were called by telephone at the time of interpretation on 12/23/2017 at 1:15 am to Dr. Ezequiel Essex , who verbally acknowledged these results. Electronically Signed   By: Elon Alas M.D.   On: 12/23/2017 01:17    Procedures Procedures (including critical care time)  Medications Ordered in ED Medications  levETIRAcetam (KEPPRA) IVPB 1000 mg/100 mL premix (has no administration in time range)  LORazepam (ATIVAN) injection 1 mg (  1 mg Intravenous Given 12/23/17 0018)     Initial Impression / Assessment and Plan / ED Course  I have reviewed the triage vital signs and the nursing notes.  Pertinent labs & imaging results that were available during my care of the patient were reviewed by me and considered in my medical decision making (see chart for details).    Patient with apparent new onset of status epilepticus.  Family at bedside confirms DNR/DNI. Vitals stable.  Continues to have twitching activity of her left arm and face with left-sided gaze.  She is given IV Ativan with the understanding that this may suppress respiratory drive.  The family is agreeable.  We will also load with IV Keppra.  CT head shows no hemorrhage.  Discussed with Dr. Dorann Lodge radiology.  There is suspicion for right sided watershed MCA subacute infarct versus metastases.  Seen by neurology Dr. Lucia Gaskins who agrees this is likely due to seizure rather than stroke.  Seizure activity has ceased after IV Ativan and IV Keppra.  Patient maintaining adequate respiratory effort on nasal cannula.  Results discussed with family.  They do not feel that they will want to have aggressive work-up but are agreeable to admission for seizure control and consultation with neurology.  Patient somnolent after Ativan but maintaining her airway and respiratory drive adequate.  Family confirms  DNR and DNI.  Continue Keppra per neurology recommendations and will admit for observation.  Discussed with Dr. Myna Hidalgo.  CRITICAL CARE Performed by: Ezequiel Essex Total critical care time: 45 minutes Critical care time was exclusive of separately billable procedures and treating other patients. Critical care was necessary to treat or prevent imminent or life-threatening deterioration. Critical care was time spent personally by me on the following activities: development of treatment plan with patient and/or surrogate as well as nursing, discussions with consultants, evaluation of patient's response to treatment, examination of patient, obtaining history from patient or surrogate, ordering and performing treatments and interventions, ordering and review of laboratory studies, ordering and review of radiographic studies, pulse oximetry and re-evaluation of patient's condition.  Final Clinical Impressions(s) / ED Diagnoses   Final diagnoses:  Status epilepticus [facility])    ED Discharge Orders    None       Ezequiel Essex, MD 12/23/17 [id number]    Ezequiel Essex, MD 12/23/17 0234

## 2017-12-23 NOTE — H&P (Signed)
History and Physical    TWANIA BUJAK IWL:[id number] DOB: [birth year] DOA: 12/22/2017  PCP: Fayrene Helper, MD   Patient coming from: Home   Chief Complaint: Seizure-like activity   HPI: [name] is a 77 y.o. female with medical history significant for dementia, depression with anxiety, COPD, and hypertension, now presenting to the emergency department with seizure-like activity.  History is obtained from the patient's son at the bedside.  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  This lasted approximately 3 minutes.  She was placed in the private vehicle for transport to the hospital and had jerking of her left side and left-sided gaze en route.  ED Course: Upon arrival to the ED, patient is found to be afebrile, saturating well on room air, slightly tachycardic, lightly tachypneic, and with normal blood pressure.  EKG features a sinus rhythm and noncontrast head CT is concerning for multifocal small right MCA and anterior watershed subacute infarcts, less likely metastatic disease or cerebritis.  Chemistry panel was unremarkable and CBC notable for leukocytosis to 13,200.  Patient had recurrent seizure-like activity in the ED that was aborted with 1 mg IV Ativan.  She was loaded with a gram of Keppra.  Tele-neurology evaluation was completed in the ED.  Admission to [facility] was discussed in order to obtain MRI and inpatient neurology consultation, however patient's son would prefer that she stay here for a more palliative approach, noting that they are not interested in any further evaluation such as MRI.  Review of Systems:  All other systems reviewed and apart from HPI, are negative.  Past Medical  History:  Diagnosis Date  . Anxiety   . Aortic stenosis    Status post bioprosthetic AVR 2004  . Arthritis   . COPD with chronic bronchitis ([place])   . Dementia ([place])   . Depression   . Essential hypertension   . History of hyperthyroidism 2012   Status post ablation  . Hyperlipidemia   . Osteoporosis   . Renal insufficiency   . Rheumatic fever   . Sebaceous cyst   . Seizures ([place])   . Urosepsis 2012   Recurrent    Past Surgical History:  Procedure Laterality Date  . AORTIC VALVE REPLACEMENT     75m porcine 10/04  . APPENDECTOMY    . BREAST LUMPECTOMY    . CARDIAC VALVE REPLACEMENT    . CHOLECYSTECTOMY    . COLON SURGERY  2005  . COLONOSCOPY  07/23/2008    Single diminutive polyp in the rectum status post cold biopsy removal/ Status post right hemicolectomy/ Colonic ulcers in the upstream end the colon distal to the  anastomosis as described above.  Status post biopsy. The anastomosis itself and neoterminal ileum appeared normal  . ESOPHAGOGASTRODUODENOSCOPY  10/12/09   short segment Barrett's esophage/small hiatal hernia,antral ersions  . LUMBAR LAMINECTOMY    . TONSILLECTOMY       reports that she quit smoking about 5 years ago. Her smoking use included cigarettes. She has a 12.50 pack-year smoking history. She has never used smokeless tobacco. She reports that she does not drink alcohol or use drugs.  Allergies  Allergen Reactions  .  Levofloxacin Anaphylaxis  . Nitrofurantoin Shortness Of Breath    Acute dyspnea  . Bupropion Hcl Other (See Comments)    unknown  . Cyclobenzaprine Hcl Other (See Comments)    unknown  . Gadolinium      Desc: PT REFUSES MR & CT CONTRAST DUE TO PRIOR REACTION, PT STATES HAIR FELL OUT & SKIN CHANGED COLOR   . Lorazepam     Causes mental confusion/breakdown  . Penicillins Other (See Comments)    Child hood allergy Has patient had a PCN reaction causing immediate rash, facial/tongue/throat swelling, SOB or lightheadedness with  hypotension: Yes Has patient had a PCN reaction causing severe rash involving mucus membranes or skin necrosis: No Has patient had a PCN reaction that required hospitalization: No Has patient had a PCN reaction occurring within the last 10 years: No If all of the above answers are "NO", then may proceed with Cephalosporin use.  . Statins Other (See Comments)    Joint stiffness  . Sulfonamide Derivatives Other (See Comments)    unknown    Family History  Problem Relation Age of Onset  . Heart attack Mother 82  . Stroke Father 22  . Hypertension Sister   . Depression Sister   . Alcohol abuse Sister   . Diabetes Son      Prior to Admission medications   Medication Sig Start Date End Date Taking? Authorizing Provider  albuterol (PROVENTIL) (2.5 MG/3ML) 0.083% nebulizer solution TAKE 3 MLS (2.5 MG TOTAL) BY NEBULIZATION 3 (THREE) TIMES DAILY. 03/21/17   Fayrene Helper, MD  aspirin 81 MG tablet Take 1 tablet (81 mg total) by mouth daily. 11/01/13   Kathie Dike, MD  donepezil (ARICEPT) 10 MG tablet TAKE 1 TABLET BY MOUTH AT BEDTIME. 08/03/17   Fayrene Helper, MD  famotidine (PEPCID) 20 MG tablet Take 20 mg by mouth at bedtime.    [provider]  lansoprazole (PREVACID) 15 MG capsule Take twocapsules 30 minutes before food 06/29/16   Fayrene Helper, MD  losartan (COZAAR) 25 MG tablet Take 0.5 tablets (12.5 mg total) by mouth daily. 10/12/16   Satira Sark, MD  losartan (COZAAR) 25 MG tablet TAKE 1 TABLET EVERY DAY AS NEEDED 11/01/17   Fayrene Helper, MD  mirtazapine (REMERON) 7.5 MG tablet TAKE 1 TABLET BY MOUTH EVERYDAY AT BEDTIME 12/11/17   Fayrene Helper, MD  sertraline (ZOLOFT) 25 MG tablet TAKE 1 TABLET BY MOUTH EVERY DAY 11/22/17   Fayrene Helper, MD  tiotropium (SPIRIVA HANDIHALER) 18 MCG inhalation capsule INHALE 1 CAPSULE WITH HANDIHALER AT THE SAME TIME EVERY DAY 12/06/17   Fayrene Helper, MD  UNABLE TO FIND Nebulizer kit and tubing.  (please give child-size tubing) DX: J44.9 02/22/17   Fayrene Helper, MD    Physical Exam: Vitals:   12/23/17 0045 12/23/17 0100 12/23/17 0115 12/23/17 0132  BP:  138/62  122/69  Pulse: 86 98 81 96  Resp: 20 (!) '26 19 17  ' Temp:      TempSrc:      SpO2: 100% 100% 100% 100%  Weight:        Constitutional: NAD, obtunded, frail  Eyes: PERTLA, lids and conjunctivae normal ENMT: Mucous membranes are moist. Posterior pharynx clear of any exudate or lesions.   Neck: normal, supple, no masses, no thyromegaly Respiratory: clear to auscultation bilaterally, no wheezing, no crackles. Normal respiratory effort.    Cardiovascular: S1 & S2 heard, regular rate and rhythm No extremity edema.  Abdomen: No distension, no tenderness, soft. Bowel sounds normal.  Musculoskeletal: no clubbing / cyanosis. No joint deformity upper and lower extremities.    Skin: no significant rashes, lesions, ulcers. Warm, dry, well-perfused. Neurologic: obtunded. No gross facial asymmetry. Not following commands, no spontaneous extremity movement.     Labs on Admission: I have personally reviewed following labs and imaging studies  CBC: Recent Labs  Lab 12/23/17 0013 12/23/17 0019  WBC 13.2*  --   NEUTROABS 10.8*  --   HGB 12.6 13.6  HCT 41.7 40.0  MCV 92.1  --   PLT 357  --    Basic Metabolic Panel: Recent Labs  Lab 12/23/17 0013 12/23/17 0019  NA 138 140  K 3.8 4.0  CL 104 104  CO2 25  --   GLUCOSE 159* 156*  BUN 12 13  CREATININE 0.70 0.70  CALCIUM 9.0  --    GFR: CrCl cannot be calculated (Unknown ideal weight.). Liver Function Tests: Recent Labs  Lab 12/23/17 0013  AST 18  ALT 11  ALKPHOS 102  BILITOT 0.7  PROT 7.0  ALBUMIN 3.6   No results for input(s): LIPASE, AMYLASE in the last 168 hours. No results for input(s): AMMONIA in the last 168 hours. Coagulation Profile: Recent Labs  Lab 12/23/17 0013  INR 0.97   Cardiac Enzymes: No results for input(s): CKTOTAL, CKMB,  CKMBINDEX, TROPONINI in the last 168 hours. BNP (last 3 results) No results for input(s): PROBNP in the last 8760 hours. HbA1C: No results for input(s): HGBA1C in the last 72 hours. CBG: No results for input(s): GLUCAP in the last 168 hours. Lipid Profile: No results for input(s): CHOL, HDL, LDLCALC, TRIG, CHOLHDL, LDLDIRECT in the last 72 hours. Thyroid Function Tests: No results for input(s): TSH, T4TOTAL, FREET4, T3FREE, THYROIDAB in the last 72 hours. Anemia Panel: No results for input(s): VITAMINB12, FOLATE, FERRITIN, TIBC, IRON, RETICCTPCT in the last 72 hours. Urine analysis:    Component Value Date/Time   COLORURINE YELLOW 12/19/2016 [place] 12/19/2016 1102   LABSPEC 1.008 12/19/2016 1102   PHURINE 5.0 12/19/2016 1102   GLUCOSEU NEGATIVE 12/19/2016 1102   HGBUR MODERATE (A) 12/19/2016 1102   BILIRUBINUR NEGATIVE 12/19/2016 1102   BILIRUBINUR negative 09/02/2015 1207   KETONESUR 5 (A) 12/19/2016 1102   PROTEINUR NEGATIVE 12/19/2016 1102   UROBILINOGEN 0.2 09/02/2015 1207   UROBILINOGEN 2.0 (H) 10/28/2013 1811   NITRITE NEGATIVE 12/19/2016 1102   LEUKOCYTESUR NEGATIVE 12/19/2016 1102   Sepsis Labs: '@LABRCNTIP' (procalcitonin:4,lacticidven:4) )No results found for this or any previous visit (from the past 240 hour(s)).   Radiological Exams on Admission: Ct Head Code Stroke Wo Contrast  Result Date: 12/23/2017 CLINICAL DATA:  Code stroke. Seizures. History of hypertension and dementia. EXAM: CT HEAD WITHOUT CONTRAST TECHNIQUE: Contiguous axial images were obtained from the base of the skull through the vertex without intravenous contrast. COMPARISON:  MRI head October 29, 2013 FINDINGS: Moderately motion degraded examination. BRAIN: New multifocal RIGHT frontotemporal focal blurring of the gray-white matter junction with subcortical edema. Small area RIGHT parietal encephalomalacia. Confluent supratentorial white matter hypodensities. Old small cerebellar  infarcts. No acute large vascular territory infarcts. No abnormal extra-axial fluid collections. Basal cisterns are patent. 12 mm LEFT posterior fossa calcified meningioma without mass effect, unchanged. VASCULAR: Moderate calcific atherosclerosis of the carotid siphons. SKULL: No skull fracture. No significant scalp soft tissue swelling. SINUSES/ORBITS: Severe chronic LEFT maxillary sinusitis. Mastoid air cells are well aerated. Soft tissue RIGHT external auditory canal most compatible  with cerumen.The included ocular globes and orbital contents are non-suspicious. Status post bilateral ocular lens implants. OTHER: None. IMPRESSION: 1. Motion degraded examination. Multifocal small RIGHT MCA and anterior watershed territory subacute infarcts, less likely metastasis or cerebritis. MRI of the brain with contrast would better characterize. 2. Moderate to severe chronic small vessel ischemic changes. Old small cerebellar infarcts. 3. Critical Value/emergent results were called by telephone at the time of interpretation on 12/23/2017 at 1:15 am to Dr. Ezequiel Essex , who verbally acknowledged these results. Electronically Signed   By: Elon Alas M.D.   On: 12/23/2017 01:17    EKG: Independently reviewed. Sinus rhythm.   Assessment/Plan  1. Seizures  - Presents with seizure like activity, had observed seizure in ED aborted with Ativan  - CT head features new lesions suspected to reflect subacute CVA - She was loaded with 1 g IV Keppra  - Family not interested in transfer to [facility] for neurology consultation and MRI, preferring that she stay here with a palliative focus  - Continue Keppra 500 mg q12h, seizure precautions, supportive care   2. Suspected CVA  - Presents with new-onset seizures  - CT findings most suggestive of new ischemic infarcts, though metastatic disease also possible  - MRI with contrast recommended by radiology for further characterization, but family not interested in  further testing at this time, favoring a palliative approach  - Permit HTN for now, keep NPO pending swallow screen, continue supportive care   3. COPD  - No wheezing on admission  - Continue Spiriva and albuterol    4. Hypertension  - BP at goal  - Hold antihypertensives in setting of likely acute or subacute ischemic CVA   5. Dementia  - Continue Aricept     DVT prophylaxis: Lovenox Code Status: DNR  Family Communication: Son and daughter-in-law updated at bedside Consults called: Teleneurology Admission status: Observation     Vianne Bulls, MD [facility] Pager [phone]  If 7PM-7AM, please contact night-coverage [email or web address] Password [email or web address]  12/23/2017, 2:04 AM

## 2017-12-24 MED ORDER — SODIUM CHLORIDE 0.9 % IV SOLN
INTRAVENOUS | Status: AC
  Administered 2017-12-24 – 2017-12-25 (×2): via INTRAVENOUS

## 2017-12-24 MED ORDER — ENOXAPARIN SODIUM 30 MG/0.3ML ~~LOC~~ SOLN
30.0000 mg | SUBCUTANEOUS | Status: DC
  Administered 2017-12-24 – 2017-12-28 (×5): 30 mg via SUBCUTANEOUS
  Filled 2017-12-24 (×4): qty 0.3

## 2017-12-24 MED ORDER — IPRATROPIUM BROMIDE 0.02 % IN SOLN
RESPIRATORY_TRACT | Status: AC
  Administered 2017-12-24: 0.5 mg
  Filled 2017-12-24: qty 2.5

## 2017-12-24 MED ORDER — IPRATROPIUM-ALBUTEROL 0.5-2.5 (3) MG/3ML IN SOLN
3.0000 mL | Freq: Four times a day (QID) | RESPIRATORY_TRACT | Status: DC
  Administered 2017-12-24 – 2017-12-27 (×15): 3 mL via RESPIRATORY_TRACT
  Filled 2017-12-24 (×15): qty 3

## 2017-12-24 MED ORDER — SODIUM CHLORIDE 0.9 % IV SOLN
250.0000 mg | Freq: Two times a day (BID) | INTRAVENOUS | Status: DC
  Administered 2017-12-24 – 2017-12-26 (×6): 250 mg via INTRAVENOUS
  Filled 2017-12-24 (×10): qty 2.5

## 2017-12-24 MED ORDER — ALBUTEROL SULFATE (2.5 MG/3ML) 0.083% IN NEBU
2.5000 mg | INHALATION_SOLUTION | RESPIRATORY_TRACT | Status: DC | PRN
  Administered 2017-12-24: 2.5 mg via RESPIRATORY_TRACT
  Filled 2017-12-24 (×2): qty 3

## 2017-12-24 NOTE — Progress Notes (Signed)
PROGRESS NOTE    [name]  ZSW:[id number] DOB: [birth year] DOA: 12/22/2017 PCP: Fayrene Helper, MD    Brief Narrative:  77 year old female who was admitted to the hospital with seizures.  CT head showed subacute CVA.  She does have a history of dementia.  Family was not interested in further work-up including MRI or transfer to transfer for neurology consultation.  She was loaded with Keppra.  They prefer a palliative approach.  Palliative care has been consulted.  Patient is followed by hospice as an outpatient.  Currently somnolent, so Keppra dose has been reduced.  Continue to monitor.   Assessment & Plan:   Principal Problem:   Seizures ([place]) Active Problems:   Depression with anxiety   Essential hypertension, benign   COPD GOLD III with reversibility    Dementia without behavioral disturbance ([facility])   Ischemic cerebrovascular accident (CVA) ([place])   Status epilepticus ([place])   1. Seizures.  Patient presented with seizure activity.  These resolved after receiving Ativan.  CT head showed new lesion suspected to reflect subacute CVA.  She was loaded with 1 g of IV Keppra.  She is continued on 500 mg of Keppra twice daily.  She is remained somnolent throughout the day.  We will decrease Keppra dose to 250 mg twice daily.  Continue seizure precautions.  Family was not interested in transfer [facility] for neurology consultation or MRI.  She is still lethargic.  Speech therapy has been consulted to assess swallowing, when she is more awake. 2. Suspected CVA.  Presents with new onset seizures.  CT findings suggestive of new ischemic infarcts.  Although metastatic disease is also possibility.  Family not interested in pursuing further MRI or favoring a palliative approach.  She is followed by hospice as an outpatient. 3. COPD.  No wheezing.  Continue Spiriva and albuterol 4. Hypertension.  Blood pressures currently stable.  Continue to follow. 5. Dementia.  Continue Aricept.   DVT  prophylaxis: Lovenox Code Status: DNR Family Communication: Discussed with daughter-in-law at the bedside Disposition Plan: Home versus hospice home   Consultants:   Palliative care  Procedures:     Antimicrobials:       Subjective: Patient is somnolent, briefly responsive voice, falls back asleep.  Does not follow commands.  Objective: Vitals:   12/24/17 1055 12/24/17 1438 12/24/17 1503 12/24/17 1843  BP: (!) 170/65  (!) 158/64 (!) 160/57  Pulse: 80  96 88  Resp: 20  20 20   Temp:    98.4 F (36.9 C)  TempSrc:    Oral  SpO2: 93% 91% 100% 100%  Weight:      Height:        Intake/Output Summary (Last 24 hours) at 12/24/2017 1936 Last data filed at 12/24/2017 1900 Gross per 24 hour  Intake 259.15 ml  Output 151 ml  Net 108.15 ml   Filed Weights   12/23/17 0010 12/23/17 0255  Weight: 40.8 kg 40.1 kg    Examination:  General exam: Patient is moaning in bed, appears mildly uncomfortable Respiratory system: Clear to auscultation. Respiratory effort normal. Cardiovascular system: S1 & S2 heard, RRR. No JVD, murmurs, rubs, gallops or clicks. No pedal edema. Gastrointestinal system: Abdomen is nondistended, soft and nontender. No organomegaly or masses felt. Normal bowel sounds heard. Central nervous system:  No focal neurological deficits. Extremities: Symmetric 5 x 5 power. Skin: No rashes, lesions or ulcers Psychiatry: Does not answer questions    Data Reviewed: I have personally reviewed following  labs and imaging studies  CBC: Recent Labs  Lab 12/23/17 0013 12/23/17 0019  WBC 13.2*  --   NEUTROABS 10.8*  --   HGB 12.6 13.6  HCT 41.7 40.0  MCV 92.1  --   PLT 357  --    Basic Metabolic Panel: Recent Labs  Lab 12/23/17 0013 12/23/17 0019  NA 138 140  K 3.8 4.0  CL 104 104  CO2 25  --   GLUCOSE 159* 156*  BUN 12 13  CREATININE 0.70 0.70  CALCIUM 9.0  --    GFR: Estimated Creatinine Clearance: 37.9 mL/min (by C-G formula based on SCr of  0.7 mg/dL). Liver Function Tests: Recent Labs  Lab 12/23/17 0013  AST 18  ALT 11  ALKPHOS 102  BILITOT 0.7  PROT 7.0  ALBUMIN 3.6   No results for input(s): LIPASE, AMYLASE in the last 168 hours. No results for input(s): AMMONIA in the last 168 hours. Coagulation Profile: Recent Labs  Lab 12/23/17 0013  INR 0.97   Cardiac Enzymes: No results for input(s): CKTOTAL, CKMB, CKMBINDEX, TROPONINI in the last 168 hours. BNP (last 3 results) No results for input(s): PROBNP in the last 8760 hours. HbA1C: No results for input(s): HGBA1C in the last 72 hours. CBG: No results for input(s): GLUCAP in the last 168 hours. Lipid Profile: No results for input(s): CHOL, HDL, LDLCALC, TRIG, CHOLHDL, LDLDIRECT in the last 72 hours. Thyroid Function Tests: No results for input(s): TSH, T4TOTAL, FREET4, T3FREE, THYROIDAB in the last 72 hours. Anemia Panel: No results for input(s): VITAMINB12, FOLATE, FERRITIN, TIBC, IRON, RETICCTPCT in the last 72 hours. Sepsis Labs: No results for input(s): PROCALCITON, LATICACIDVEN in the last 168 hours.  No results found for this or any previous visit (from the past 240 hour(s)).       Radiology Studies: Ct Head Code Stroke Wo Contrast  Result Date: 12/23/2017 CLINICAL DATA:  Code stroke. Seizures. History of hypertension and dementia. EXAM: CT HEAD WITHOUT CONTRAST TECHNIQUE: Contiguous axial images were obtained from the base of the skull through the vertex without intravenous contrast. COMPARISON:  MRI head October 29, 2013 FINDINGS: Moderately motion degraded examination. BRAIN: New multifocal RIGHT frontotemporal focal blurring of the gray-white matter junction with subcortical edema. Small area RIGHT parietal encephalomalacia. Confluent supratentorial white matter hypodensities. Old small cerebellar infarcts. No acute large vascular territory infarcts. No abnormal extra-axial fluid collections. Basal cisterns are patent. 12 mm LEFT posterior fossa  calcified meningioma without mass effect, unchanged. VASCULAR: Moderate calcific atherosclerosis of the carotid siphons. SKULL: No skull fracture. No significant scalp soft tissue swelling. SINUSES/ORBITS: Severe chronic LEFT maxillary sinusitis. Mastoid air cells are well aerated. Soft tissue RIGHT external auditory canal most compatible with cerumen.The included ocular globes and orbital contents are non-suspicious. Status post bilateral ocular lens implants. OTHER: None. IMPRESSION: 1. Motion degraded examination. Multifocal small RIGHT MCA and anterior watershed territory subacute infarcts, less likely metastasis or cerebritis. MRI of the brain with contrast would better characterize. 2. Moderate to severe chronic small vessel ischemic changes. Old small cerebellar infarcts. 3. Critical Value/emergent results were called by telephone at the time of interpretation on 12/23/2017 at 1:15 am to Dr. Ezequiel Essex , who verbally acknowledged these results. Electronically Signed   By: Elon Alas M.D.   On: 12/23/2017 01:17        Scheduled Meds: . aspirin EC  81 mg Oral Daily  . chlorhexidine  15 mL Mouth Rinse BID  . donepezil  10 mg Oral QHS  .  enoxaparin (LOVENOX) injection  30 mg Subcutaneous Q24H  . famotidine  20 mg Oral QHS  . ipratropium-albuterol  3 mL Nebulization Q6H  . mouth rinse  15 mL Mouth Rinse q12n4p  . mirtazapine  7.5 mg Oral QHS  . pantoprazole  40 mg Oral Daily  . sertraline  25 mg Oral Daily  . sodium chloride flush  3 mL Intravenous Q12H   Continuous Infusions: . sodium chloride 75 mL/hr at 12/24/17 1530  . levETIRAcetam 250 mg (12/24/17 1640)     LOS: 1 day    Time spent: 35 minutes    Kathie Dike, MD [facility] Pager [phone]  If 7PM-7AM, please contact night-coverage [email or web address] Password [facility] 12/24/2017, 7:36 PM

## 2017-12-24 NOTE — Progress Notes (Signed)
No seizure activity noted today.  Drowsy all day and opens eyes for a second on command.  Attempt at grip very weak and on latest assessment and unable to lift arms or legs or hold up when lifted.

## 2017-12-24 NOTE — Progress Notes (Signed)
SLP Cancellation Note  Patient Details Name: [name] MRN: [id number] DOB: 29-Oct-1940   Cancelled treatment:       Reason Eval/Treat Not Completed: Fatigue/lethargy limiting ability to participate. SLP communicated with family and RN; if Pt becomes alert and will permit RN to provide thorough oral care then permit single ice chips. ST will continue to follow for goals of care and to re-attempt BSE as pt becomes alert and appropriate for PO trials. Thank you,  Shelbia Scinto H. Roddie Mc, CCC-SLP Speech Language Pathologist    Wende Bushy 12/24/2017, 3:51 PM

## 2017-12-24 NOTE — Progress Notes (Signed)
OT Cancellation Note  Patient Details Name: [name] MRN: [id number] DOB: Apr 15, 1940   Cancelled Treatment:    Reason Eval/Treat Not Completed: Other (comment) Family is currently wishes for palliative care and comfort measures. Patient is currently a Hospice patient. At this time, OT evaluation is not warranted due to family wishes. Will remove patient from caseload at this time. Thank you for the referral.     Ailene Ravel, OTR/L,CBIS  [phone]  12/24/2017, 9:42 AM

## 2017-12-25 DIAGNOSIS — Z515 Encounter for palliative care: Secondary | ICD-10-CM

## 2017-12-25 DIAGNOSIS — R1310 Dysphagia, unspecified: Secondary | ICD-10-CM

## 2017-12-25 DIAGNOSIS — Z7189 Other specified counseling: Secondary | ICD-10-CM

## 2017-12-25 MED ORDER — LABETALOL HCL 5 MG/ML IV SOLN
20.0000 mg | Freq: Once | INTRAVENOUS | Status: AC
  Administered 2017-12-25: 20 mg via INTRAVENOUS
  Filled 2017-12-25: qty 4

## 2017-12-25 MED ORDER — LORAZEPAM 2 MG/ML IJ SOLN: 0.5000 mg | Freq: Once | INTRAMUSCULAR | Status: DC

## 2017-12-25 MED ORDER — SODIUM CHLORIDE 0.9 % IV SOLN
INTRAVENOUS | Status: AC
  Administered 2017-12-25 – 2017-12-26 (×2): via INTRAVENOUS

## 2017-12-25 MED ORDER — HYDRALAZINE HCL 20 MG/ML IJ SOLN
10.0000 mg | Freq: Four times a day (QID) | INTRAMUSCULAR | Status: DC | PRN
  Administered 2017-12-28: 10 mg via INTRAVENOUS
  Filled 2017-12-25: qty 1

## 2017-12-25 NOTE — Progress Notes (Addendum)
PROGRESS NOTE    [name]  ZOX:[id number] DOB: 09-20-1940 DOA: 12/22/2017 PCP: Fayrene Helper, MD    Brief Narrative:  77 year old female who was admitted to the hospital with seizures.  CT head showed subacute CVA.  She does have a history of dementia.  Family was not interested in further work-up including MRI or transfer to transfer for neurology consultation.  She was loaded with Keppra.  They prefer a palliative approach.  Palliative care has been consulted.  Patient is followed by hospice as an outpatient.  Currently somnolent, so Keppra dose has been reduced.  Continue to monitor.   Assessment & Plan:   Principal Problem:   Seizures ([place]) Active Problems:   Depression with anxiety   Essential hypertension, benign   COPD GOLD III with reversibility    Dementia without behavioral disturbance (HCC)   Ischemic cerebrovascular accident (CVA) ([place])   Status epilepticus ([place])   Dysphagia   1. Seizures.  Patient presented with seizure activity.  These resolved after receiving Ativan.  CT head showed new lesion suspected to reflect subacute CVA.  She was loaded with 1 g of IV Keppra and continued on 500 mg of Keppra twice daily.  She  remained somnolent the following day.  Keppra dose was decreased to 250 mg twice daily.  Her overall mental status has appeared to be improving today continue seizure precautions.  Family was not interested in transfer to [facility] for neurology consultation or MRI.  2. Acute encephalopathy.  Likely related to seizures as well as medication such as Keppra.  Urinalysis did not show any convincing evidence of infection, urine culture in process. 3. Suspected CVA.  Presents with new onset seizures.  CT findings suggestive of new ischemic infarcts.  Although metastatic disease is also possibility.  Family not interested in pursuing further MRI or favoring a palliative approach.  She is followed by hospice as an outpatient. 4. COPD.  No wheezing.   Continue Spiriva and albuterol 5. Hypertension.  Blood pressures currently stable.  Continue to follow. 6. Dementia.  Continue Aricept. 7. Goals of care.  Palliative care following to assist with further disposition.  She is currently DNR and comfort measures.   DVT prophylaxis: Lovenox Code Status: DNR Family Communication: Discussed with daughter-in-law at the bedside Disposition Plan: Home versus hospice home   Consultants:   Palliative care  Procedures:     Antimicrobials:       Subjective: Patient wakes up to voice, denies any pain.  Answer simple questions.  Objective: Vitals:   12/25/17 0739 12/25/17 1052 12/25/17 1433 12/25/17 1455  BP:  (!) 159/72  (!) 210/68  Pulse:  88  (!) 114  Resp:  20  20  Temp:  (!) 97.5 F (36.4 C)  98.2 F (36.8 C)  TempSrc:  Oral  Oral  SpO2: 97% 100% 97% 100%  Weight:      Height:        Intake/Output Summary (Last 24 hours) at 12/25/2017 1756 Last data filed at 12/25/2017 0900 Gross per 24 hour  Intake 791.46 ml  Output 150 ml  Net 641.46 ml   Filed Weights   12/23/17 0010 12/23/17 0255  Weight: 40.8 kg 40.1 kg    Examination:  General exam: Somnolent, but wakes up to voice, no distress Respiratory system: Diminished breath sounds bilaterally. Respiratory effort normal. Cardiovascular system:RRR. No murmurs, rubs, gallops. Gastrointestinal system: Abdomen is nondistended, soft and nontender. No organomegaly or masses felt. Normal bowel sounds heard. Central  nervous system: No focal neurological deficits. Extremities: No C/C/E, +pedal pulses Skin: No rashes, lesions or ulcers Psychiatry: Somnolent, but more awake today, answering questions    Data Reviewed: I have personally reviewed following labs and imaging studies  CBC: Recent Labs  Lab 12/23/17 0013 12/23/17 0019  WBC 13.2*  --   NEUTROABS 10.8*  --   HGB 12.6 13.6  HCT 41.7 40.0  MCV 92.1  --   PLT 357  --    Basic Metabolic Panel: Recent Labs    Lab 12/23/17 0013 12/23/17 0019  NA 138 140  K 3.8 4.0  CL 104 104  CO2 25  --   GLUCOSE 159* 156*  BUN 12 13  CREATININE 0.70 0.70  CALCIUM 9.0  --    GFR: Estimated Creatinine Clearance: 37.9 mL/min (by C-G formula based on SCr of 0.7 mg/dL). Liver Function Tests: Recent Labs  Lab 12/23/17 0013  AST 18  ALT 11  ALKPHOS 102  BILITOT 0.7  PROT 7.0  ALBUMIN 3.6   No results for input(s): LIPASE, AMYLASE in the last 168 hours. No results for input(s): AMMONIA in the last 168 hours. Coagulation Profile: Recent Labs  Lab 12/23/17 0013  INR 0.97   Cardiac Enzymes: No results for input(s): CKTOTAL, CKMB, CKMBINDEX, TROPONINI in the last 168 hours. BNP (last 3 results) No results for input(s): PROBNP in the last 8760 hours. HbA1C: No results for input(s): HGBA1C in the last 72 hours. CBG: No results for input(s): GLUCAP in the last 168 hours. Lipid Profile: No results for input(s): CHOL, HDL, LDLCALC, TRIG, CHOLHDL, LDLDIRECT in the last 72 hours. Thyroid Function Tests: No results for input(s): TSH, T4TOTAL, FREET4, T3FREE, THYROIDAB in the last 72 hours. Anemia Panel: No results for input(s): VITAMINB12, FOLATE, FERRITIN, TIBC, IRON, RETICCTPCT in the last 72 hours. Sepsis Labs: No results for input(s): PROCALCITON, LATICACIDVEN in the last 168 hours.  No results found for this or any previous visit (from the past 240 hour(s)).       Radiology Studies: No results found.      Scheduled Meds: . aspirin EC  81 mg Oral Daily  . chlorhexidine  15 mL Mouth Rinse BID  . donepezil  10 mg Oral QHS  . enoxaparin (LOVENOX) injection  30 mg Subcutaneous Q24H  . famotidine  20 mg Oral QHS  . ipratropium-albuterol  3 mL Nebulization Q6H  . mouth rinse  15 mL Mouth Rinse q12n4p  . mirtazapine  7.5 mg Oral QHS  . pantoprazole  40 mg Oral Daily  . sertraline  25 mg Oral Daily  . sodium chloride flush  3 mL Intravenous Q12H   Continuous Infusions: . sodium  chloride 75 mL/hr at 12/25/17 1754  . levETIRAcetam 250 mg (12/25/17 1427)     LOS: 2 days    Time spent: 25 minutes    Kathie Dike, MD [facility] Pager [phone]  If 7PM-7AM, please contact night-coverage [email or web address] Password [facility] 12/25/2017, 5:56 PM

## 2017-12-25 NOTE — Consult Note (Signed)
Consultation Note Date: 12/25/2017   Patient Name: [name]  DOB: Feb 04, 1941  MRN: [id number]  Age / Sex: 77 y.o., female  PCP: Fayrene Helper, MD Referring Physician: Kathie Dike, MD  Reason for Consultation: Establishing goals of care and Hospice Evaluation  HPI/Patient Profile: 77 y.o. female  with past medical history of COPD, dementia, anxiety/depression, HTN, h/o seizures, recurrent urosepsis admitted from home with hospice care on 12/22/2017 with seizure activity. CT head shows multifocal small right MCA and anterior watershed subacute infarct (less likely mets or cerebritis) and mod to severe chronic small vessel ischemic changes.   Clinical Assessment and Goals of Care: I met today at [name]'s bedside this morning. Ms. [name]") is sleepy and will only open her eyes briefly but does follow all simple commands appropriately. She is able to state her name but not where she is currently (even after being told a couple minutes prior). She tells me "uh-huh" when asked if she was hungry. SLP in to see.   I met later with her daughter-in-law, [name], who is her main caregiver. We were joined by hospice Education officer, museum. They tell me that [name] has been declining over past week but has had many events in the past when they felt [name] was near end of life and then she would always awaken and start eating and improving. Recently able to [facility] with walker at [facility]. [name] concerned about UTI as she has had previous complications in past with UTI. However, per [name] and nursing [place] is more alert today, still lethargic, but improved from yesterday. Approved for diet per SLP and NT attempting to feed but [name] was SOB prior to feeding and began to have increasing SOB with wheezing. Feeding attempts were stopped and RT requested for neb treatment.   Discussed plan of care with [name]. [name] shares that  [name]'s sons work odd hours and would be unable to meet with me tomorrow but she will be available as needed. [name] was in the process of texting them our conversation. [name] confirms the goals for comfort and they seem that they have been prepared for EOL for some time but [name] keeps surprising everyone. We agreed to speak further tomorrow to see if [name] continues to improve or decline and how she is tolerating diet. They do not desire further work up from head CT as the results would not change their plan of care focused on comfort. They would not want surgery/invasive procedure or work up of any cancer suspicions.   All questions/concerns addressed. Emotional support provided.   Primary Decision Maker NEXT OF KIN 2 sons - will need to verify with them prior to any formal decisions (or at least verify that daughter-in-law [name] may speak for them)    SUMMARY OF RECOMMENDATIONS   - Monitor another 24 hours - Will work with family and hospice to make adequate plan based on family wishes and patient's new baseline - Will speak with family again tomorrow  Code Status/Advance Care Planning:  DNR  Symptom Management:   Resp distress: Chronic and she utilizes oxygen prn along with prn nebs at home.   Restlessness is much improved today.   Dysphagia is new and will monitor aspiration risk and amount of intake tolerated. SLP following.   Seizures: Keppra per primary. PRN IV Ativan.   Palliative Prophylaxis:   Aspiration, Bowel Regimen, Delirium Protocol, Frequent Pain Assessment and Turn Reposition  Additional Recommendations (Limitations, Scope, Preferences):  Comfort focused care. Would want treatment of UTI/infection and seizures but no aggressive interventions or diagnostics.   Psycho-social/Spiritual:   Desire for further Chaplaincy support:yes  Additional Recommendations: Caregiving  Support/Resources, Education on Hospice and Grief/Bereavement Support  Prognosis:   < 6  months and will monitor as she may be < 2 weeks and more appropriate for hospice facility vs hospice at home. To be discussed further tomorrow with hospice and family.   Discharge Planning: To Be Determined      Primary Diagnoses: Present on Admission: . Ischemic cerebrovascular accident (CVA) ([place]) . COPD GOLD III with reversibility  . Dementia without behavioral disturbance ([place]) . Depression with anxiety . Essential hypertension, benign . Status epilepticus ([place])   I have reviewed the medical record, interviewed the patient and family, and examined the patient. The following aspects are pertinent.  Past Medical History:  Diagnosis Date  . Anxiety   . Aortic stenosis    Status post bioprosthetic AVR 2004  . Arthritis   . COPD with chronic bronchitis ([place])   . Dementia ([place])   . Depression   . Essential hypertension   . History of hyperthyroidism 2012   Status post ablation  . Hyperlipidemia   . Osteoporosis   . Renal insufficiency   . Rheumatic fever   . Sebaceous cyst   . Seizures ([place])   . Urosepsis 2012   Recurrent   Social History   Socioeconomic History  . Marital status: Widowed    Spouse name: Not on file  . Number of children: Not on file  . Years of education: Not on file  . Highest education level: Not on file  Occupational History  . Occupation: Retired Aeronautical engineer  . Financial resource strain: Patient refused  . Food insecurity:    Worry: Patient refused    Inability: Patient refused  . Transportation needs:    Medical: Patient refused    Non-medical: Patient refused  Tobacco Use  . Smoking status: Former Smoker    Packs/day: 0.25    Years: 50.00    Pack years: 12.50    Types: Cigarettes    Last attempt to quit: 04/30/2012    Years since quitting: 5.6  . Smokeless tobacco: Never Used  Substance and Sexual Activity  . Alcohol use: No    Alcohol/week: 0.0 standard drinks  . Drug use: No  . Sexual activity: Not Currently    Lifestyle  . Physical activity:    Days per week: Patient refused    Minutes per session: Patient refused  . Stress: Patient refused  Relationships  . Social connections:    Talks on phone: Patient refused    Gets together: Patient refused    Attends religious service: Patient refused    Active member of club or organization: Patient refused    Attends meetings of clubs or organizations: Patient refused    Relationship status: Patient refused  Other Topics Concern  . Not on file  Social History Narrative  . Not on file   Family History  Problem Relation Age of Onset  . Heart attack Mother 74  . Stroke Father 31  . Hypertension Sister   . Depression Sister   . Alcohol abuse Sister   . Diabetes Son    Scheduled Meds: . aspirin EC  81 mg Oral Daily  . chlorhexidine  15 mL Mouth Rinse BID  . donepezil  10 mg Oral QHS  . enoxaparin (LOVENOX) injection  30 mg Subcutaneous Q24H  . famotidine  20 mg Oral QHS  . ipratropium-albuterol  3 mL Nebulization Q6H  . mouth rinse  15 mL Mouth Rinse q12n4p  . mirtazapine  7.5 mg Oral QHS  . pantoprazole  40 mg Oral Daily  . sertraline  25 mg Oral Daily  . sodium chloride flush  3 mL Intravenous Q12H   Continuous Infusions: . sodium chloride 75 mL/hr at 12/25/17 0621  . levETIRAcetam 250 mg (12/25/17 1427)   PRN Meds:.acetaminophen **OR** acetaminophen, albuterol, LORazepam, ondansetron **OR** ondansetron (ZOFRAN) IV Allergies  Allergen Reactions  . Levofloxacin Anaphylaxis  . Nitrofurantoin Shortness Of Breath    Acute dyspnea  . Bupropion Hcl Other (See Comments)    unknown  . Cyclobenzaprine Hcl Other (See Comments)    unknown  . Gadolinium      Desc: PT REFUSES MR & CT CONTRAST DUE TO PRIOR REACTION, PT STATES HAIR FELL OUT & SKIN CHANGED COLOR   . Penicillins Other (See Comments)    Child hood allergy Has patient had a PCN reaction causing immediate rash, facial/tongue/throat swelling, SOB or lightheadedness with  hypotension: Yes Has patient had a PCN reaction causing severe rash involving mucus membranes or skin necrosis: No Has patient had a PCN reaction that required hospitalization: No Has patient had a PCN reaction occurring within the last 10 years: No If all of the above answers are "NO", then may proceed with Cephalosporin use.  . Statins Other (See Comments)    Joint stiffness  . Sulfonamide Derivatives Other (See Comments)    unknown   Review of Systems  Unable to perform ROS: Acuity of condition    Physical Exam  Constitutional: She appears lethargic. She appears cachectic. She appears ill.  Frail, elderly  HENT:  Head: Normocephalic and atraumatic.  Cardiovascular: Tachycardia present.  Pulmonary/Chest: Accessory muscle usage present. Tachypnea noted. She is in respiratory distress. She has decreased breath sounds. She has wheezes.  RN called RT for neb x 2  Abdominal: Soft. Normal appearance.  Neurological: She appears lethargic. She is disoriented.  Follows commands but lethargic and with baseline confusion Able to tell her name and tell me her daughter-in-law's name  Nursing note and vitals reviewed.   Vital Signs: BP (!) 159/72 (BP Location: Left Arm)   Pulse 88   Temp (!) 97.5 F (36.4 C) (Oral)   Resp 20   Ht 5' (1.524 m)   Wt 40.1 kg   SpO2 97%   BMI 17.27 kg/m  Pain Scale: Faces       SpO2: SpO2: 97 % O2 Device:SpO2: 97 % O2 Flow Rate: .O2 Flow Rate (L/min): 3 L/min  IO: Intake/output summary:   Intake/Output Summary (Last 24 hours) at 12/25/2017 1446 Last data filed at 12/25/2017 0900 Gross per 24 hour  Intake 1050.61 ml  Output 150 ml  Net 900.61 ml    LBM: Last BM Date: 12/25/17 Baseline Weight: Weight: 40.8 kg Most recent weight: Weight: 40.1 kg     Palliative Assessment/Data: 30%     Time In/Out: 0950-1010, 1350-1440 Time  Total: 70 min Greater than 50%  of this time was spent counseling and coordinating care related to the above  assessment and plan.  Signed by: Vinie Sill, NP Palliative Medicine Team Pager # [phone] (M-F 8a-5p) Team Phone # [phone] (Nights/Weekends)

## 2017-12-25 NOTE — Evaluation (Signed)
Clinical/Bedside Swallow Evaluation Patient Details  Name: [name] MRN: [id number] Date of Birth: 08/01/1940  Today's Date: 12/25/2017 Time: SLP Start Time (ACUTE ONLY): 1010 SLP Stop Time (ACUTE ONLY): 1037 SLP Time Calculation (min) (ACUTE ONLY): 27 min  Past Medical History:  Past Medical History:  Diagnosis Date  . Anxiety   . Aortic stenosis    Status post bioprosthetic AVR 2004  . Arthritis   . COPD with chronic bronchitis ([place])   . Dementia ([place])   . Depression   . Essential hypertension   . History of hyperthyroidism 2012   Status post ablation  . Hyperlipidemia   . Osteoporosis   . Renal insufficiency   . Rheumatic fever   . Sebaceous cyst   . Seizures ([place])   . Urosepsis 2012   Recurrent   Past Surgical History:  Past Surgical History:  Procedure Laterality Date  . AORTIC VALVE REPLACEMENT     45mm porcine 10/04  . APPENDECTOMY    . BREAST LUMPECTOMY    . CARDIAC VALVE REPLACEMENT    . CHOLECYSTECTOMY    . COLON SURGERY  2005  . COLONOSCOPY  07/23/2008    Single diminutive polyp in the rectum status post cold biopsy removal/ Status post right hemicolectomy/ Colonic ulcers in the upstream end the colon distal to the  anastomosis as described above.  Status post biopsy. The anastomosis itself and neoterminal ileum appeared normal  . ESOPHAGOGASTRODUODENOSCOPY  10/12/09   short segment Barrett's esophage/small hiatal hernia,antral ersions  . LUMBAR LAMINECTOMY    . TONSILLECTOMY     HPI:  77 year old female who was admitted to the hospital with seizures.  CT head showed subacute CVA.  She does have a history of dementia.  Family was not interested in further work-up including MRI or transfer to transfer for neurology consultation.  She was loaded with Keppra.  They prefer a palliative approach.  Palliative care has been consulted.  Patient is followed by hospice as an outpatient.  Currently somnolent, so Keppra dose has been reduced.  Continue to monitor.    Assessment / Plan / Recommendation Clinical Impression  Pt with improved alertness this date and appropriate for clinical swallow evaluation. Pt able to follow 1-step commands related to moving her body parts with occasional repetitions required. Pt consumed ice chips, thin water via cup/straw, sherbet, and applesauce via hand over hand assist from SLP. Pt without overt signs or symptoms of aspiration, however Pt is at risk for aspiration given COPD/respiratory compromise, intermittent lethargy, and reduced cognitive status. Family has expressed an interest in palliative measures and in fact, Pt is followed by hospice as an outpatient for COPD. Family is not present at this time. Will initiate D1/puree with thin liquids with 100% feeder assist and po meds whole or crushed as able in puree and only when Pt is alert and upright. Continue to provide oral care. SLP will follow during acute stay. Above to RN and MD.   SLP Visit Diagnosis: Dysphagia, unspecified (R13.10)    Aspiration Risk  Mild aspiration risk;Risk for inadequate nutrition/hydration;Moderate aspiration risk    Diet Recommendation Dysphagia 1 (Puree);Thin liquid   Liquid Administration via: Cup;Straw Medication Administration: Whole meds with puree Supervision: Staff to assist with self feeding;Full supervision/cueing for compensatory strategies Compensations: Slow rate;Small sips/bites Postural Changes: Seated upright at 90 degrees;Remain upright for at least 30 minutes after po intake    Other  Recommendations Oral Care Recommendations: Oral care BID;Staff/trained caregiver to provide oral care Other  Recommendations: Clarify dietary restrictions   Follow up Recommendations 24 hour supervision/assistance      Frequency and Duration min 2x/week  1 week       Prognosis Prognosis for Safe Diet Advancement: Fair Barriers to Reach Goals: Cognitive deficits      Swallow Study   General Date of Onset: 12/22/17 HPI:  77 year old female who was admitted to the hospital with seizures.  CT head showed subacute CVA.  She does have a history of dementia.  Family was not interested in further work-up including MRI or transfer to transfer for neurology consultation.  She was loaded with Keppra.  They prefer a palliative approach.  Palliative care has been consulted.  Patient is followed by hospice as an outpatient.  Currently somnolent, so Keppra dose has been reduced.  Continue to monitor. Type of Study: Bedside Swallow Evaluation Previous Swallow Assessment: None on record Diet Prior to this Study: NPO Temperature Spikes Noted: No Respiratory Status: Nasal cannula History of Recent Intubation: No Behavior/Cognition: Alert;Cooperative;Requires cueing;Lethargic/Drowsy Oral Cavity Assessment: Dry;Dried secretions Oral Care Completed by SLP: Yes Oral Cavity - Dentition: Adequate natural dentition;Missing dentition Vision: Impaired for self-feeding Self-Feeding Abilities: Needs assist(hand over hand feeder assist) Patient Positioning: Upright in bed Baseline Vocal Quality: Normal;Low vocal intensity Volitional Cough: Weak Volitional Swallow: Able to elicit    Oral/Motor/Sensory Function Overall Oral Motor/Sensory Function: Within functional limits(bruising noted on left tongue blade)   Ice Chips Ice chips: Within functional limits Presentation: Spoon   Thin Liquid Thin Liquid: Within functional limits Presentation: Cup;Straw(hand over hand assist)    Nectar Thick Nectar Thick Liquid: Not tested   Honey Thick Honey Thick Liquid: Not tested   Puree Puree: Within functional limits Presentation: Spoon   Solid     Solid: Not tested     Thank you,  Genene Churn, [place]  , 12/25/2017,11:47 AM

## 2017-12-25 NOTE — Progress Notes (Signed)
More arousable today and especially when working with speech therapy for swallow screen.  Can state first name today and gave short answers such as yes and no.  Ate whole magic cup and drank cup of tea with straw and had no choking.  Can cough and clear throat on command.

## 2017-12-26 ENCOUNTER — Inpatient Hospital Stay (HOSPITAL_COMMUNITY): Payer: Medicare Other

## 2017-12-26 DIAGNOSIS — J189 Pneumonia, unspecified organism: Secondary | ICD-10-CM

## 2017-12-26 DIAGNOSIS — J181 Lobar pneumonia, unspecified organism: Secondary | ICD-10-CM

## 2017-12-26 LAB — URINE CULTURE: Culture: NO GROWTH

## 2017-12-26 MED ORDER — METOPROLOL TARTRATE 5 MG/5ML IV SOLN
5.0000 mg | Freq: Four times a day (QID) | INTRAVENOUS | Status: DC
  Administered 2017-12-26 – 2017-12-27 (×3): 5 mg via INTRAVENOUS
  Filled 2017-12-26 (×3): qty 5

## 2017-12-26 MED ORDER — SODIUM CHLORIDE 0.9 % IV SOLN
100.0000 mg | Freq: Two times a day (BID) | INTRAVENOUS | Status: DC
  Administered 2017-12-26 – 2017-12-27 (×2): 100 mg via INTRAVENOUS
  Filled 2017-12-26 (×6): qty 100

## 2017-12-26 MED ORDER — METHYLPREDNISOLONE SODIUM SUCC 125 MG IJ SOLR
60.0000 mg | Freq: Two times a day (BID) | INTRAMUSCULAR | Status: DC
  Administered 2017-12-26 – 2017-12-28 (×6): 60 mg via INTRAVENOUS
  Filled 2017-12-26 (×6): qty 2

## 2017-12-26 NOTE — Progress Notes (Signed)
PROGRESS NOTE    [name]  FAO:[id number] DOB: [name] 31, 1941 DOA: 12/22/2017 PCP: Fayrene Helper, MD    Brief Narrative:  77 year old female who was admitted to the hospital with seizures.  CT head showed subacute CVA.  She does have a history of dementia.  Family was not interested in further work-up including MRI or transfer to transfer for neurology consultation.  She was loaded with Keppra.  They prefer a palliative approach.  Palliative care has been consulted.  Patient is followed by hospice as an outpatient.  Currently somnolent, so Keppra dose has been reduced.  Continue to monitor.   Assessment & Plan:   Principal Problem:   Seizures ([place]) Active Problems:   Depression with anxiety   Essential hypertension, benign   COPD GOLD III with reversibility    Dementia without behavioral disturbance (HCC)   Ischemic cerebrovascular accident (CVA) ([place])   Status epilepticus ([place])   Dysphagia   Pneumonia of right upper lobe due to infectious organism ([place])   1. Seizures.  Patient presented with seizure activity.  These resolved after receiving Ativan.  CT head showed new lesion suspected to reflect subacute CVA.  She was loaded with 1 g of IV Keppra and continued on 500 mg of Keppra twice daily.  She  remained somnolent the following day.  Keppra dose was decreased to 250 mg twice daily.  Her overall mental status has appears to be improving. Continue seizure precautions.  Transition Keppra to p.o. once cleared by speech therapy. Family was not interested in transfer to [facility] for neurology consultation or MRI.  2. Acute encephalopathy.  Likely related to seizures as well as medication such as Keppra.  Urinalysis did not show any evidence of infection, and urine culture did not show any growth.  Overall mental status appears to be improving.  Keppra dose was decreased to 250 mg twice daily. 3. Suspected CVA.  Presents with new onset seizures.  CT findings suggestive of new  ischemic infarcts.  Although metastatic disease is also possibility.  Family not interested in pursuing further MRI or favoring a palliative approach.  She is followed by hospice as an outpatient. 4. COPD exacerbation.  Patient developed significant wheezing overnight.  Started on intravenous steroids.  Continue Spiriva and albuterol 5. Possible pneumonia.  Chest x-ray indicates possible developing pneumonia.  Will start on intravenous doxycycline.  Transition to p.o. once cleared by speech therapy 6. Hypertension.  Blood pressure remains elevated.  She is on PRN hydralazine.  We will add scheduled IV Lopressor since she is also tachycardic. 7. Dementia.  Continue Aricept. 8. Goals of care.  Palliative care following to assist with further disposition.  She is currently DNR and comfort measures.  Ideally, family would wish to take her home if she is able to continue to participate in her adult daycare during the day.  If she is unable to participate, then she will likely need rehab placement.   DVT prophylaxis: Lovenox Code Status: DNR Family Communication: Discussed with daughter-in-law at the bedside Disposition Plan: Home versus hospice home   Consultants:   Palliative care  Procedures:     Antimicrobials:   Doxycycline 11/6 >   Subjective: Patient is more awake today.  She was short of breath last night.  Chest x-ray indicated possible pneumonia.  She was wheezing last night and was started on steroids.  Speech therapy following to see if diet can be advanced.  Objective: Vitals:   12/26/17 0655 12/26/17 0802 12/26/17 1346  12/26/17 1403  BP: (!) 162/62  (!) 197/97   Pulse: 86  (!) 113   Resp: 20  20   Temp: 97.7 F (36.5 C)     TempSrc: Oral     SpO2: 100% 99% 93% 98%  Weight:      Height:        Intake/Output Summary (Last 24 hours) at 12/26/2017 1749 Last data filed at 12/26/2017 1700 Gross per 24 hour  Intake 1768.9 ml  Output -  Net 1768.9 ml   Filed Weights     12/23/17 0010 12/23/17 0255  Weight: 40.8 kg 40.1 kg    Examination:  General exam: Alert, awake, no distress Respiratory system: Diminished breath sounds with mild wheeze bilaterally.  Mildly increased respiratory effort. Cardiovascular system:RRR. No murmurs, rubs, gallops. Gastrointestinal system: Abdomen is nondistended, soft and nontender. No organomegaly or masses felt. Normal bowel sounds heard. Central nervous system:  No focal neurological deficits. Extremities: No C/C/E, +pedal pulses Skin: No rashes, lesions or ulcers Psychiatry: More awake today, confused  Data Reviewed: I have personally reviewed following labs and imaging studies  CBC: Recent Labs  Lab 12/23/17 0013 12/23/17 0019  WBC 13.2*  --   NEUTROABS 10.8*  --   HGB 12.6 13.6  HCT 41.7 40.0  MCV 92.1  --   PLT 357  --    Basic Metabolic Panel: Recent Labs  Lab 12/23/17 0013 12/23/17 0019  NA 138 140  K 3.8 4.0  CL 104 104  CO2 25  --   GLUCOSE 159* 156*  BUN 12 13  CREATININE 0.70 0.70  CALCIUM 9.0  --    GFR: Estimated Creatinine Clearance: 37.9 mL/min (by C-G formula based on SCr of 0.7 mg/dL). Liver Function Tests: Recent Labs  Lab 12/23/17 0013  AST 18  ALT 11  ALKPHOS 102  BILITOT 0.7  PROT 7.0  ALBUMIN 3.6   No results for input(s): LIPASE, AMYLASE in the last 168 hours. No results for input(s): AMMONIA in the last 168 hours. Coagulation Profile: Recent Labs  Lab 12/23/17 0013  INR 0.97   Cardiac Enzymes: No results for input(s): CKTOTAL, CKMB, CKMBINDEX, TROPONINI in the last 168 hours. BNP (last 3 results) No results for input(s): PROBNP in the last 8760 hours. HbA1C: No results for input(s): HGBA1C in the last 72 hours. CBG: No results for input(s): GLUCAP in the last 168 hours. Lipid Profile: No results for input(s): CHOL, HDL, LDLCALC, TRIG, CHOLHDL, LDLDIRECT in the last 72 hours. Thyroid Function Tests: No results for input(s): TSH, T4TOTAL, FREET4, T3FREE,  THYROIDAB in the last 72 hours. Anemia Panel: No results for input(s): VITAMINB12, FOLATE, FERRITIN, TIBC, IRON, RETICCTPCT in the last 72 hours. Sepsis Labs: No results for input(s): PROCALCITON, LATICACIDVEN in the last 168 hours.  Recent Results (from the past 240 hour(s))  Culture, Urine     Status: None   Collection Time: 12/24/17  5:13 PM  Result Value Ref Range Status   Specimen Description   Final    URINE, CATHETERIZED Performed at [facility], [address] [place], [place] [address]    Special Requests   Final    NONE Performed at [facility], [address] [place], [place] [address]    Culture   Final    NO GROWTH Performed at [facility], [place] [address], [place], [place] [address]    Report Status 12/26/2017 FINAL  Final         Radiology Studies: Dg Chest  Port 1 View  Result Date: 12/26/2017 CLINICAL DATA:  Swelling function test done today. Now wheezing. EXAM: PORTABLE CHEST 1 VIEW COMPARISON:  12/19/2016 FINDINGS: Patient rotation limits examination. Postoperative changes in the mediastinum. Emphysematous changes in the lungs. Peribronchial thickening and mild streaky infiltration particularly in the right upper lung. This may represent developing pneumonia. No blunting of costophrenic angles. No pneumothorax. Mediastinal contours appear intact. Normal heart size and pulmonary vascularity. Calcification of the aorta. IMPRESSION: Emphysematous changes in the lungs. Peribronchial thickening with streaky infiltration in the right upper lung suggesting developing pneumonia. Electronically Signed   By: Lucienne Capers M.D.   On: 12/26/2017 02:53        Scheduled Meds: . aspirin EC  81 mg Oral Daily  . chlorhexidine  15 mL Mouth Rinse BID  . donepezil  10 mg Oral QHS  . enoxaparin (LOVENOX) injection  30 mg Subcutaneous Q24H  . famotidine  20 mg Oral QHS  . ipratropium-albuterol  3 mL Nebulization Q6H  . LORazepam  0.5 mg Intravenous Once  . mouth  rinse  15 mL Mouth Rinse q12n4p  . methylPREDNISolone (SOLU-MEDROL) injection  60 mg Intravenous Q12H  . mirtazapine  7.5 mg Oral QHS  . pantoprazole  40 mg Oral Daily  . sertraline  25 mg Oral Daily  . sodium chloride flush  3 mL Intravenous Q12H   Continuous Infusions: . doxycycline (VIBRAMYCIN) IV    . levETIRAcetam 250 mg (12/26/17 1342)     LOS: 3 days    Time spent: 25 minutes    Kathie Dike, MD [facility] Pager [phone]  If 7PM-7AM, please contact night-coverage [email or web address] Password [facility] 12/26/2017, 5:49 PM

## 2017-12-26 NOTE — Progress Notes (Signed)
Daily Progress Note   Patient Name: [name]       Date: 12/26/2017 DOB: 09-Jun-1940  Age: 77 y.o. MRN#: [id number] Attending Physician: Kathie Dike, MD Primary Care Physician: Fayrene Helper, MD Admit Date: 12/22/2017  Reason for Consultation/Follow-up: Establishing goals of care and Hospice Evaluation  Subjective: [name] is more alert today. Pulling off her gown when I come in. Will answer yes/no questions but confused at baseline with dementia but confusion and restlessness appear worse than baseline. No family at bedside.   Length of Stay: 3  Current Medications: Scheduled Meds:  . aspirin EC  81 mg Oral Daily  . chlorhexidine  15 mL Mouth Rinse BID  . donepezil  10 mg Oral QHS  . enoxaparin (LOVENOX) injection  30 mg Subcutaneous Q24H  . famotidine  20 mg Oral QHS  . ipratropium-albuterol  3 mL Nebulization Q6H  . LORazepam  0.5 mg Intravenous Once  . mouth rinse  15 mL Mouth Rinse q12n4p  . methylPREDNISolone (SOLU-MEDROL) injection  60 mg Intravenous Q12H  . mirtazapine  7.5 mg Oral QHS  . pantoprazole  40 mg Oral Daily  . sertraline  25 mg Oral Daily  . sodium chloride flush  3 mL Intravenous Q12H    Continuous Infusions: . sodium chloride 75 mL/hr at 12/26/17 0631  . levETIRAcetam Stopped (12/25/17 2352)    PRN Meds: acetaminophen **OR** acetaminophen, albuterol, hydrALAZINE, LORazepam, ondansetron **OR** ondansetron (ZOFRAN) IV  Physical Exam  Constitutional: She appears cachectic. She appears ill.  HENT:  Head: Normocephalic and atraumatic.  Cardiovascular: Normal rate.  Pulmonary/Chest: Effort normal. No accessory muscle usage. No tachypnea. No respiratory distress. She has no wheezes.  Abdominal: Soft. Normal appearance.  Neurological: She is  alert. She is disoriented.  Nursing note and vitals reviewed.           Vital Signs: BP (!) 162/62 (BP Location: Left Arm)   Pulse 86   Temp 97.7 F (36.5 C) (Oral)   Resp 20   Ht 5' (1.524 m)   Wt 40.1 kg   SpO2 99%   BMI 17.27 kg/m  SpO2: SpO2: 99 % O2 Device: O2 Device: Nasal Cannula O2 Flow Rate: O2 Flow Rate (L/min): 3 L/min  Intake/output summary:   Intake/Output Summary (Last 24 hours) at 12/26/2017 1155 Last data  filed at 12/26/2017 0850 Gross per 24 hour  Intake 1305.9 ml  Output -  Net 1305.9 ml   LBM: Last BM Date: 12/25/17 Baseline Weight: Weight: 40.8 kg Most recent weight: Weight: 40.1 kg       Palliative Assessment/Data:    Flowsheet Rows     Most Recent Value  Intake Tab  Referral Department  Hospitalist  Unit at Time of Referral  Med/Surg Unit  Date Notified  12/24/17  Palliative Care Type  New Palliative care  Reason for referral  Clarify Goals of Care, Counsel Regarding Hospice  Date of Admission  12/22/17  Date first seen by Palliative Care  12/25/17  # of days Palliative referral response time  1 Day(s)  # of days IP prior to Palliative referral  2  Clinical Assessment  Psychosocial & Spiritual Assessment  Palliative Care Outcomes      Patient Active Problem List   Diagnosis Date Noted  . Dysphagia   . Seizures ([place]) 12/23/2017  . Ischemic cerebrovascular accident (CVA) ([place]) 12/23/2017  . Status epilepticus ([place]) 12/23/2017  . Osteoarthritis of both shoulders 09/23/2016  . Supplemental oxygen dependent 07/02/2016  . Reduced vision 04/29/2015  . Allergic rhinitis 12/29/2014  . GERD (gastroesophageal reflux disease) 10/18/2014  . Protein-calorie malnutrition, severe ([place]) 10/29/2013  . Cerebellar stroke ([place]) 10/29/2013  . Fall at home 10/28/2013  . Dementia without behavioral disturbance ([place]) 09/08/2012  . Underweight 07/05/2011  . Lung nodule 02/16/2010  . IMPAIRED FASTING GLUCOSE 07/12/2009  . Depression with anxiety  09/09/2008  . AORTIC VALVE DISEASE 08/10/2008  . Insomnia secondary to depression with anxiety 07/27/2008  . OTHER OSTEOPOROSIS 06/09/2008  . UNSPECIFIED DISORDER OF THYROID 06/20/2007  . Essential hypertension, benign 03/15/2007  . Diastolic CHF, chronic ([place]) 03/15/2007  . COPD GOLD III with reversibility  03/15/2007    Palliative Care Assessment & Plan   HPI: 77 y.o. female  with past medical history of COPD, dementia, anxiety/depression, HTN, h/o seizures, recurrent urosepsis admitted from home with hospice care on 12/22/2017 with seizure activity. CT head shows multifocal small right MCA and anterior watershed subacute infarct (less likely mets or cerebritis) and mod to severe chronic small vessel ischemic changes. Having some slow improvements in mentation and able to tolerate modified diet of pureed with thin liquids. PCXR chest done d/t worsening wheezing and now being treated for RUL pneumonia and COPD exacerbation.   Assessment: Rhoda is more alert today but I fear she is far from baseline where she was able to walk with walker. She is incontinent at baseline. She is now requiring feeding assist as well with pureed diet and aspiration precautions.   I spoke with hospice as well as daughter-in-law regarding [name]'s care. The main deciding factor is IF the [place] will be able to continue caring for [name] while family works during day. If they can no longer care for her in this state then family is open to rehab stay (which would mean she would not have hospice BUT hospice could come back in after rehab if appropriate). Also if [name] has decline to make prognosis < 2 weeks then she would be eligible for hospice facility. Currently I do not feel prognosis is < 2 weeks as we are seeing some improvement. Family also interested in antibiotics to treat pneumonia and UTI if urine culture positive. Family is open to comfort and hospice facility if she were to decline further.    Recommendations/Plan:  PT to eval to help [place] decide  her care needs to see if needs can be met at home and adult day care. If not she may require rehab if she continues to improve.   Continue modified pureed diet per SLP recommendations.   Goals of Care and Additional Recommendations:  Limitations on Scope of Treatment: Comfort focused care.   Code Status:  DNR  Prognosis:   < 3 months  Discharge Planning:  To Be Determined. Home with hospice vs SNF rehab (based on care needs).   Care plan was discussed with Dr. Roderic Palau.   Thank you for allowing the Palliative Medicine Team to assist in the care of this patient.   Total Time 45 min Prolonged Time Billed  no       Greater than 50%  of this time was spent counseling and coordinating care related to the above assessment and plan.  Vinie Sill, NP Palliative Medicine Team Pager # [phone] (M-F 8a-5p) Team Phone # [phone] (Nights/Weekends)

## 2017-12-26 NOTE — Progress Notes (Signed)
  Speech Language Pathology Treatment: Dysphagia  Patient Details Name: [name] MRN: [id number] DOB: 25-Nov-1940 Today's Date: 12/26/2017 Time: 3474-2595 SLP Time Calculation (min) (ACUTE ONLY): 32 min  Assessment / Plan / Recommendation Clinical Impression  Pt seen for ongoing diagnostic dysphagia intervention following clinical swallow evaluation completed yesterday. Notes from previous day reviewed and acknowledged Pt with increased SOB. Pt easily roused this AM and verbally requested food/drink after oral care. SLP presented thin, nectar-thick liquids, and puree boluses. Pt with clavicular breathing with noted increased RR throughout trials, likely due to increased respiratory demands with meal consumption (having to sequence respiration and swallow/breath hold). Pt with one delayed, dry coughing episode. Vocal quality remained clear throughout meal. Pt appeared eager to eat, no significant difference noted with nectars versus thin, however Pt did require cues to decrease rate of intake for both.  Pt is certainly at risk for aspiration given compromised respiratory status and dependent feeder status in setting of medical decline, however family reportedly has desired comfort measures for most of her care. Her family is not present at this time. SLP can complete MBSS to objectively evaluate swallow if Pt/family/MD desire, if this will change family's course of treatment or we can continue with a "safest diet" with suspected risk of aspiration of D1/puree and thin liquids. SLP will check back later today to speak with family and medical care team to better understand goals of care.     HPI HPI: 77 year old female who was admitted to the hospital with seizures.  CT head showed subacute CVA.  She does have a history of dementia.  Family was not interested in further work-up including MRI or transfer to transfer for neurology consultation.  She was loaded with Keppra.  They prefer a palliative  approach.  Palliative care has been consulted.  Patient is followed by hospice as an outpatient.  Currently somnolent, so Keppra dose has been reduced.  Continue to monitor.      SLP Plan  Continue with current plan of care       Recommendations  Diet recommendations: Dysphagia 1 (puree);Thin liquid Liquids provided via: Cup;Straw Medication Administration: Whole meds with puree Supervision: Staff to assist with self feeding;Full supervision/cueing for compensatory strategies Compensations: Slow rate;Small sips/bites Postural Changes and/or Swallow Maneuvers: Seated upright 90 degrees;Upright 30-60 min after meal                Oral Care Recommendations: Oral care BID;Staff/trained caregiver to provide oral care Follow up Recommendations: 24 hour supervision/assistance SLP Visit Diagnosis: Dysphagia, unspecified (R13.10) Plan: Continue with current plan of care       Thank you,  [name], Cash                 [place] 12/26/2017, 11:19 AM

## 2017-12-26 NOTE — Progress Notes (Signed)
Patient with persistent wheezing / COPD exacerbation despite nebs: 1) check CXR 2) will order steroids for COPD exacerbation.

## 2017-12-26 NOTE — Progress Notes (Addendum)
Late entry  Patient with increased wheezing remaining following breathing treatment. MD made aware, see orders.

## 2017-12-27 ENCOUNTER — Inpatient Hospital Stay (HOSPITAL_COMMUNITY): Payer: Medicare Other

## 2017-12-27 MED ORDER — SODIUM CHLORIDE 0.9 % IV SOLN: INTRAVENOUS | Status: DC | PRN

## 2017-12-27 MED ORDER — METOPROLOL TARTRATE 25 MG PO TABS
25.0000 mg | ORAL_TABLET | Freq: Two times a day (BID) | ORAL | Status: DC
  Administered 2017-12-27 – 2017-12-28 (×3): 25 mg via ORAL
  Filled 2017-12-27 (×3): qty 1

## 2017-12-27 MED ORDER — IPRATROPIUM-ALBUTEROL 0.5-2.5 (3) MG/3ML IN SOLN
3.0000 mL | Freq: Four times a day (QID) | RESPIRATORY_TRACT | Status: DC
  Administered 2017-12-28 (×3): 3 mL via RESPIRATORY_TRACT
  Filled 2017-12-27 (×4): qty 3

## 2017-12-27 MED ORDER — LEVETIRACETAM 250 MG PO TABS
250.0000 mg | ORAL_TABLET | Freq: Two times a day (BID) | ORAL | Status: DC
  Administered 2017-12-27 – 2017-12-28 (×3): 250 mg via ORAL
  Filled 2017-12-27 (×3): qty 1

## 2017-12-27 MED ORDER — DOXYCYCLINE HYCLATE 100 MG PO TABS
100.0000 mg | ORAL_TABLET | Freq: Two times a day (BID) | ORAL | Status: DC
  Administered 2017-12-27 – 2017-12-28 (×2): 100 mg via ORAL
  Filled 2017-12-27 (×2): qty 1

## 2017-12-27 NOTE — Progress Notes (Addendum)
Daily Progress Note   Patient Name: FRANCIS YARDLEY       Date: 12/27/2017 DOB: [birth year]  Age: 77 y.o. MRN#: [id number] Attending Physician: Murlean Iba, MD Primary Care Physician: Fayrene Helper, MD Admit Date: 12/22/2017  Reason for Consultation/Follow-up: Establishing goals of care and Hospice Evaluation  Subjective: Mahitha is awake and alert. Although confused she will answer questions and follows commands. Not always able to answer questions appropriately.   Length of Stay: 4  Current Medications: Scheduled Meds:  . aspirin EC  81 mg Oral Daily  . chlorhexidine  15 mL Mouth Rinse BID  . donepezil  10 mg Oral QHS  . doxycycline  100 mg Oral Q12H  . enoxaparin (LOVENOX) injection  30 mg Subcutaneous Q24H  . famotidine  20 mg Oral QHS  . ipratropium-albuterol  3 mL Nebulization Q6H  . levETIRAcetam  250 mg Oral BID  . mouth rinse  15 mL Mouth Rinse q12n4p  . methylPREDNISolone (SOLU-MEDROL) injection  60 mg Intravenous Q12H  . metoprolol tartrate  25 mg Oral BID  . mirtazapine  7.5 mg Oral QHS  . pantoprazole  40 mg Oral Daily  . sertraline  25 mg Oral Daily  . sodium chloride flush  3 mL Intravenous Q12H    Continuous Infusions: . sodium chloride      PRN Meds: sodium chloride, acetaminophen **OR** acetaminophen, albuterol, hydrALAZINE, LORazepam, ondansetron **OR** ondansetron (ZOFRAN) IV  Physical Exam  Constitutional: She appears cachectic. She has a sickly appearance.  HENT:  Head: Normocephalic and atraumatic.  Cardiovascular: Normal rate.  Pulmonary/Chest: No accessory muscle usage. No tachypnea. No respiratory distress. She has wheezes in the right upper field.  Abdominal: Soft. Normal appearance.  Neurological: She is alert. She is disoriented.    Nursing note and vitals reviewed.           Vital Signs: BP (!) 164/81   Pulse 84   Temp 98.4 F (36.9 C) (Oral)   Resp 20   Ht 5' (1.524 m)   Wt 40.1 kg   SpO2 98%   BMI 17.27 kg/m  SpO2: SpO2: 98 % O2 Device: O2 Device: Nasal Cannula O2 Flow Rate: O2 Flow Rate (L/min): 2 L/min  Intake/output summary:   Intake/Output Summary (Last 24 hours) at 12/27/2017 1015 Last data filed at  12/27/2017 0328 Gross per 24 hour  Intake 1244.67 ml  Output -  Net 1244.67 ml   LBM: Last BM Date: 12/26/17 Baseline Weight: Weight: 40.8 kg Most recent weight: Weight: 40.1 kg       Palliative Assessment/Data:    Flowsheet Rows     Most Recent Value  Intake Tab  Referral Department  Hospitalist  Unit at Time of Referral  Med/Surg Unit  Date Notified  12/24/17  Palliative Care Type  New Palliative care  Reason for referral  Clarify Goals of Care, Counsel Regarding Hospice  Date of Admission  12/22/17  Date first seen by Palliative Care  12/25/17  # of days Palliative referral response time  1 Day(s)  # of days IP prior to Palliative referral  2  Clinical Assessment  Psychosocial & Spiritual Assessment  Palliative Care Outcomes      Patient Active Problem List   Diagnosis Date Noted  . Pneumonia of right upper lobe due to infectious organism ([place])   . Dysphagia   . Seizures ([place]) 12/23/2017  . Ischemic cerebrovascular accident (CVA) ([place]) 12/23/2017  . Status epilepticus ([place]) 12/23/2017  . Osteoarthritis of both shoulders 09/23/2016  . Supplemental oxygen dependent 07/02/2016  . Reduced vision 04/29/2015  . Allergic rhinitis 12/29/2014  . GERD (gastroesophageal reflux disease) 10/18/2014  . Protein-calorie malnutrition, severe ([place]) 10/29/2013  . Cerebellar stroke ([place]) 10/29/2013  . Fall at home 10/28/2013  . Dementia without behavioral disturbance ([place]) 09/08/2012  . Underweight 07/05/2011  . Lung nodule 02/16/2010  . IMPAIRED FASTING GLUCOSE 07/12/2009  . Depression  with anxiety 09/09/2008  . AORTIC VALVE DISEASE 08/10/2008  . Insomnia secondary to depression with anxiety 07/27/2008  . OTHER OSTEOPOROSIS 06/09/2008  . UNSPECIFIED DISORDER OF THYROID 06/20/2007  . Essential hypertension, benign 03/15/2007  . Diastolic CHF, chronic ([place]) 03/15/2007  . COPD GOLD III with reversibility  03/15/2007    Palliative Care Assessment & Plan   HPI: 77 y.o.femalewith past medical history of COPD, dementia, anxiety/depression, HTN, h/o seizures, recurrent urosepsisadmitted from home with hospice careon11/2/2019with seizure activity.CT head shows multifocal small right MCA and anterior watershed subacute infarct (less likely mets or cerebritis) and mod to severe chronic small vessel ischemic changes.Having some slow improvements in mentation and able to tolerate modified diet of pureed with thin liquids. PCXR chest done d/t worsening wheezing and now being treated for RUL pneumonia and COPD exacerbation.    Assessment: Exilda is alert but confused. I attempted to let her try and feed herself but she was unable to hold her milk and spilled it on herself. She appears shaky and weak and unable to feed self is new. Modified diet of pureed foods is also new.   I returned at lunch and she is up in the chair. PT worked with her and family was luckily present for session. I assisted her to have lunch placed in front of her and she was doing better with feeding herself food while up in chair.   I spoke with daughter-in-law and main caregiver, Gerilyn Nestle. Gerilyn Nestle requests for family to meet with me tomorrow at 10 am which I am happy to accommodate. We discussed some of the logistics of SNF rehab (having to revoke hospice) and insurance may not continue to pay for rehab if she is not improving. We discussed that hospice could be reinitiated after rehab stay. They have already spoken with CSW Janett Billow who is beginning SNF search.  I spoke with Caryl Pina at the [facility] and she  does  share that they would be willing to have Donnelle back even with needing considerable more assistance. They would care for her as long as she does not need 1:1 care. They would assist with ambulation as well as feeding and with oxygen requirements. I will speak with family more about these options and their preference when we can all meet tomorrow.   Recommendations/Plan:  PT to eval to help [place] decide her care needs to see if needs can be met at home and adult day care. If not she may require rehab if she continues to improve.   Continue modified pureed diet per SLP recommendations.   Goals of Care and Additional Recommendations:  Limitations on Scope of Treatment: Comfort focused care with no aggressive intervention or diagnostics. Okay with antibiotics.   Code Status:  DNR  Prognosis:   < 3 months. High risk for acute decompensation.   Discharge Planning:  To Be Determined   Thank you for allowing the Palliative Medicine Team to assist in the care of this patient.   Total Time 40 min Prolonged Time Billed  no       Greater than 50%  of this time was spent counseling and coordinating care related to the above assessment and plan.  Vinie Sill, NP Palliative Medicine Team Pager # [phone] (M-F 8a-5p) Team Phone # [phone] (Nights/Weekends)

## 2017-12-27 NOTE — Care Management (Signed)
CM in room for PT eval. PT recommending SNF. CM discussed requirement of revoking hospice for STR and family at bedside agreeable. They first request clinicals be sent to [place], later CM received notice that family no longer wants clinicals sent to [place]. Info relayed to [place] team.

## 2017-12-27 NOTE — Progress Notes (Signed)
Modified Barium Swallow Progress Note  Patient Details  Name: [name] MRN: [id number] Date of Birth: 1940-09-24  Today's Date: 12/27/2017  Modified Barium Swallow completed.  Full report located under Chart Review in the Imaging Section.  Brief recommendations include the following:  Clinical Impression  Pt presents with mild oropharyngeal dysphagia with greatest risk being Pt's shortness of breath and increased respiratory rate. Pt assessed with barium tinged thin (via teaspoon, cup, and straw), nectar thick, puree, mechanical soft, and barium tablet with cup sips thin. Swallow trigger occurred at the level of the pyriforms with thin liquids and at the level of the valleculae with NTLs, puree, and regular textures and resulted in good hyolaryngeal excursion, epiglottic deflection, and laryngeal vestibule closure. Pt did have min oral residuals with liquids which spilled to valleculae and pyriforms after the primary swallow and remained there for up to 10 seconds without a spontaneous swallow to clear. SLP cued Pt to repeat swallow which she was able to do after significant delay. Residuals were cleared after a secondary swallow. Pt appears at highest risk for aspiration with use of thin liquids by straw due to moderate residuals in pharynx noted when Pt takes sequential swallows and failed to initiate a timely swallow for the residuals. Pt with one episode of flash penetration (underepiglottic coating only) when swallowing residuals from straw sip. Pt also assessed with NTL and found to have oral residuals/base of tongue residuals in mi/mod amount which settled in valleculae and pyriforms after the initial swallow.  Cervicoesophageal phase marked by prominent cricopharyngeus. Pt is at risk for aspiration given poor respiratory support, however she demonstrates good airway protection. Recommend D1/puree due to increased RR and for energy conservation purposes and continue with thin liquids and NO  STRAWS. Encourage small sips and cue to swallow 2x for each sip to clear residuals. Pt would benefit from f/u SLP services at next venue of care to ensure diet tolerance and to advance as appropriate. PO meds whole with water.    Swallow Evaluation Recommendations       SLP Diet Recommendations: Dysphagia 1 (Puree) solids;Thin liquid   Liquid Administration via: Cup;No straw   Medication Administration: Whole meds with liquid   Supervision: Patient able to self feed;Full supervision/cueing for compensatory strategies   Compensations: Slow rate;Small sips/bites;Multiple dry swallows after each bite/sip   Postural Changes: Remain semi-upright after after feeds/meals (Comment);Seated upright at 90 degrees   Oral Care Recommendations: Oral care BID;Staff/trained caregiver to provide oral care   Other Recommendations: Clarify dietary restrictions   Thank you,  Genene Churn, [place] 12/27/2017,5:47 PM

## 2017-12-27 NOTE — Progress Notes (Signed)
PROGRESS NOTE    [name]  OQH:[id number] DOB: 01-01-1941 DOA: 12/22/2017 PCP: Fayrene Helper, MD    Brief Narrative:  77 year old female who was admitted to the hospital with seizures.  CT head showed subacute CVA.  She does have a history of dementia.  Family was not interested in further work-up including MRI or transfer to transfer for neurology consultation.  She was loaded with Keppra.  They prefer a palliative approach.  Palliative care has been consulted.  Patient is followed by hospice as an outpatient.  Currently somnolent, so Keppra dose has been reduced.  Continue to monitor.  Assessment & Plan:   Principal Problem:   Seizures ([place]) Active Problems:   Depression with anxiety   Essential hypertension, benign   COPD GOLD III with reversibility    Dementia without behavioral disturbance ([facility])   Ischemic cerebrovascular accident (CVA) ([place])   Status epilepticus ([place])   Dysphagia   Pneumonia of right upper lobe due to infectious organism ([place])   1. Seizures.  Patient presented with seizure activity.  These resolved after receiving Ativan.  CT head showed new lesion suspected to reflect subacute CVA.  She was loaded with 1 g of IV Keppra and continued on 500 mg of Keppra twice daily.  She  remained somnolent the following day.  Keppra dose was decreased to 250 mg twice daily.  Her overall mental status has appears to be improving. Continue seizure precautions.  Transition Keppra to p.o. once cleared by speech therapy. Family was not interested in transfer to [facility] for neurology consultation or MRI.  2. Acute encephalopathy.  Likely related to seizures as well as medication such as Keppra.  Urinalysis did not show any evidence of infection, and urine culture did not show any growth.  Overall mental status appears to be improving.  Keppra dose was decreased to 250 mg twice daily. 3. Suspected CVA.  Presents with new onset seizures.  CT findings suggestive of new ischemic  infarcts.  Although metastatic disease is also possibility.  Family not interested in pursuing further MRI or favoring a palliative approach.  She is followed by hospice as an outpatient. 4. COPD exacerbation.  Improving with intravenous steroids.  Continue Spiriva and albuterol 5. Pneumonia.  Chest x-ray indicates possible developing pneumonia.  Continue doxycycline. 6. Hypertension.  Blood pressure remains elevated.  She is on PRN hydralazine and scheduled metoprolol. BP better controlled.   7. Dementia.  Continue Aricept. 8. Goals of care.  Palliative care following to assist with further disposition.  She is currently DNR and comfort measures.  Ideally, family would wish to take her home if she is able to continue to participate in her adult daycare during the day.  If she is unable to participate, then she will likely need rehab placement.   DVT prophylaxis: Lovenox Code Status: DNR Family Communication: Discussed with daughter-in-law at the bedside Disposition Plan: Home versus hospice home   Consultants:   Palliative care  Procedures:     Antimicrobials:   Doxycycline 11/6 >   Subjective: Patient is breathing much better.  Pt denies CP and SOB.  No coughing.   Objective: Vitals:   12/27/17 0025 12/27/17 0231 12/27/17 0619 12/27/17 0753  BP: (!) 143/62  (!) 164/81   Pulse: 75  84   Resp:   20   Temp:   98.4 F (36.9 C)   TempSrc:   Oral   SpO2:  98% 92% 98%  Weight:  Height:        Intake/Output Summary (Last 24 hours) at 12/27/2017 1132 Last data filed at 12/27/2017 0328 Gross per 24 hour  Intake 1244.67 ml  Output -  Net 1244.67 ml   Filed Weights   12/23/17 0010 12/23/17 0255  Weight: 40.8 kg 40.1 kg    Examination:  General exam: Alert, awake, no distress, vocalizing.  Respiratory system: BBS fairly clear.   Cardiovascular system:normal s1,s2 sounds. No murmurs, rubs, gallops. Gastrointestinal system: Abdomen is nondistended, soft and  nontender. No organomegaly or masses felt. Normal bowel sounds heard. Central nervous system:  No focal neurological deficits. Extremities: No C/C/E, +pedal pulses Skin: No rashes, lesions or ulcers Psychiatry: dementia  Data Reviewed: I have personally reviewed following labs and imaging studies  CBC: Recent Labs  Lab 12/23/17 0013 12/23/17 0019  WBC 13.2*  --   NEUTROABS 10.8*  --   HGB 12.6 13.6  HCT 41.7 40.0  MCV 92.1  --   PLT 357  --    Basic Metabolic Panel: Recent Labs  Lab 12/23/17 0013 12/23/17 0019  NA 138 140  K 3.8 4.0  CL 104 104  CO2 25  --   GLUCOSE 159* 156*  BUN 12 13  CREATININE 0.70 0.70  CALCIUM 9.0  --    GFR: Estimated Creatinine Clearance: 37.9 mL/min (by C-G formula based on SCr of 0.7 mg/dL). Liver Function Tests: Recent Labs  Lab 12/23/17 0013  AST 18  ALT 11  ALKPHOS 102  BILITOT 0.7  PROT 7.0  ALBUMIN 3.6   No results for input(s): LIPASE, AMYLASE in the last 168 hours. No results for input(s): AMMONIA in the last 168 hours. Coagulation Profile: Recent Labs  Lab 12/23/17 0013  INR 0.97   Cardiac Enzymes: No results for input(s): CKTOTAL, CKMB, CKMBINDEX, TROPONINI in the last 168 hours. BNP (last 3 results) No results for input(s): PROBNP in the last 8760 hours. HbA1C: No results for input(s): HGBA1C in the last 72 hours. CBG: No results for input(s): GLUCAP in the last 168 hours. Lipid Profile: No results for input(s): CHOL, HDL, LDLCALC, TRIG, CHOLHDL, LDLDIRECT in the last 72 hours. Thyroid Function Tests: No results for input(s): TSH, T4TOTAL, FREET4, T3FREE, THYROIDAB in the last 72 hours. Anemia Panel: No results for input(s): VITAMINB12, FOLATE, FERRITIN, TIBC, IRON, RETICCTPCT in the last 72 hours. Sepsis Labs: No results for input(s): PROCALCITON, LATICACIDVEN in the last 168 hours.  Recent Results (from the past 240 hour(s))  Culture, Urine     Status: None   Collection Time: 12/24/17  5:13 PM  Result  Value Ref Range Status   Specimen Description   Final    URINE, CATHETERIZED Performed at [facility], [address], [place], [place] [address]    Special Requests   Final    NONE Performed at [facility], [address] [place], [place] [address]    Culture   Final    NO GROWTH Performed at [facility], [place] [address] [place], [place] [address]    Report Status 12/26/2017 FINAL  Final    Radiology Studies: Dg Chest Port 1 View  Result Date: 12/26/2017 CLINICAL DATA:  Swelling function test done today. Now wheezing. EXAM: PORTABLE CHEST 1 VIEW COMPARISON:  12/19/2016 FINDINGS: Patient rotation limits examination. Postoperative changes in the mediastinum. Emphysematous changes in the lungs. Peribronchial thickening and mild streaky infiltration particularly in the right upper lung. This may represent developing pneumonia. No blunting of costophrenic angles. No  pneumothorax. Mediastinal contours appear intact. Normal heart size and pulmonary vascularity. Calcification of the aorta. IMPRESSION: Emphysematous changes in the lungs. Peribronchial thickening with streaky infiltration in the right upper lung suggesting developing pneumonia. Electronically Signed   By: Lucienne Capers M.D.   On: 12/26/2017 02:53   Scheduled Meds: . aspirin EC  81 mg Oral Daily  . chlorhexidine  15 mL Mouth Rinse BID  . donepezil  10 mg Oral QHS  . doxycycline  100 mg Oral Q12H  . enoxaparin (LOVENOX) injection  30 mg Subcutaneous Q24H  . famotidine  20 mg Oral QHS  . ipratropium-albuterol  3 mL Nebulization Q6H  . levETIRAcetam  250 mg Oral BID  . mouth rinse  15 mL Mouth Rinse q12n4p  . methylPREDNISolone (SOLU-MEDROL) injection  60 mg Intravenous Q12H  . metoprolol tartrate  25 mg Oral BID  . mirtazapine  7.5 mg Oral QHS  . pantoprazole  40 mg Oral Daily  . sertraline  25 mg Oral Daily  . sodium chloride flush  3 mL Intravenous Q12H   Continuous Infusions: . sodium chloride       LOS: 4  days   Time spent: 25 minutes  Irwin Brakeman, MD [facility] Pager [phone]  If 7PM-7AM, please contact night-coverage [email or web address] Password [facility] 12/27/2017, 11:32 AM

## 2017-12-27 NOTE — Progress Notes (Signed)
  Speech Language Pathology Treatment: Dysphagia  Patient Details Name: [name] MRN: [id number] DOB: May 20, 1940 Today's Date: 12/27/2017 Time: 0923-3007 SLP Time Calculation (min) (ACUTE ONLY): 11 min  Assessment / Plan / Recommendation Clinical Impression  Pt seen for ongoing dysphagia intervention. She was received sitting up in chair consuming her lunch meal of puree and thin liquids and visibly short of breath. SLP provided cues for Pt to breathe through her nose as she has a nasal cannula. Pt consumed several cup/straw sips of thin tea, which resulted in audible swallow and delayed cough. Pt with improved alertness and it looks as if family may pursue SNF vs home with adult day program. Will proceed with MBSS to help determine appropriate diet and strategies as appropriate.    HPI HPI: 77 year old female who was admitted to the hospital with seizures.  CT head showed subacute CVA.  She does have a history of dementia.  Family was not interested in further work-up including MRI or transfer to transfer for neurology consultation.  She was loaded with Keppra.  They prefer a palliative approach.  Palliative care has been consulted.  Patient is followed by hospice as an outpatient.  Currently somnolent, so Keppra dose has been reduced.  Continue to monitor.      SLP Plan  MBS       Recommendations  Diet recommendations: Dysphagia 1 (puree);Thin liquid Liquids provided via: Cup;Straw Medication Administration: Whole meds with puree Supervision: Staff to assist with self feeding;Full supervision/cueing for compensatory strategies Compensations: Slow rate;Small sips/bites Postural Changes and/or Swallow Maneuvers: Seated upright 90 degrees;Upright 30-60 min after meal                Oral Care Recommendations: Oral care BID;Staff/trained caregiver to provide oral care Follow up Recommendations: 24 hour supervision/assistance;Skilled Nursing facility SLP Visit Diagnosis: Dysphagia,  unspecified (R13.10) Plan: MBS       Thank you,  Genene Churn, [place]                 [place] 12/27/2017, 1:51 PM

## 2017-12-27 NOTE — Plan of Care (Signed)
  Problem: Acute Rehab PT Goals(only PT should resolve) Goal: Pt Will Go Supine/Side To Sit Outcome: Progressing Flowsheets (Taken 12/27/2017 1227) Pt will go Supine/Side to Sit: with supervision Goal: Patient Will Transfer Sit To/From Stand Outcome: Progressing Flowsheets (Taken 12/27/2017 1227) Patient will transfer sit to/from stand: with min guard assist Goal: Pt Will Transfer Bed To Chair/Chair To Bed Outcome: Progressing Flowsheets (Taken 12/27/2017 1227) Pt will Transfer Bed to Chair/Chair to Bed: min guard assist Goal: Pt Will Ambulate Outcome: Progressing Flowsheets (Taken 12/27/2017 1227) Pt will Ambulate: 50 feet; with minimal assist; with rolling walker   12:27 PM, 12/27/17 Lonell Grandchild, MPT Physical Therapist with [facility] [phone] office [phone] mobile phone

## 2017-12-27 NOTE — Evaluation (Signed)
Physical Therapy Evaluation Patient Details Name: [name] MRN: [id number] DOB: 03/16/1940 Today's Date: 12/27/2017   History of Present Illness  [name] is a 77 y.o. female with medical history significant for dementia, depression with anxiety, COPD, and hypertension, now presenting to the emergency department with seizure-like activity.  History is obtained from the patient's son at the bedside.  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  This lasted approximately 3 minutes.  She was placed in the private vehicle for transport to the hospital and had jerking of her left side and left-sided gaze en route.    Clinical Impression  Patient demonstrates slow labored movement for sitting up at bedside, fall risk due to poor standing balance, has difficulty making turns requiring assistance to move RW, limited for ambulation mostly due to SOB and fatigue while on 2 LPM O2.  Patient tolerated sitting up in chair with family present in room after therapy.  Patient will benefit from continued physical therapy in hospital and recommended venue below to increase strength, balance, endurance for safe ADLs and gait.    Follow Up Recommendations SNF;Supervision/Assistance - 24 hour;Supervision for mobility/OOB    Equipment Recommendations  None recommended by PT    Recommendations for Other Services       Precautions / Restrictions Precautions Precautions: Fall Restrictions Weight Bearing Restrictions: No      Mobility  Bed Mobility Overal bed mobility: Needs Assistance Bed Mobility: Supine to Sit     Supine to sit: Min assist     General bed mobility comments: slow labored movement  Transfers Overall transfer  level: Needs assistance Equipment used: Rolling walker (2 wheeled) Transfers: Sit to/from [facility] Sit to Stand: Mod assist Stand pivot transfers: Mod assist       General transfer comment: unsteady on feet requring use of RW  Ambulation/Gait Ambulation/Gait assistance: Mod assist Gait Distance (Feet): 30 Feet Assistive device: Rolling walker (2 wheeled) Gait Pattern/deviations: Decreased step length - right;Decreased step length - left;Decreased stride length Gait velocity: slow   General Gait Details: slow labored unsteady cadence with diffiuclty making turns requiring assistance to move RW, limited mostly due to SOB and fatigue while on 2 LPM  Stairs            Wheelchair Mobility    Modified Rankin (Stroke Patients Only)       Balance Overall balance assessment: Needs assistance Sitting-balance support: Feet supported;No upper extremity supported Sitting balance-Leahy Scale: Good     Standing balance support: Bilateral upper extremity supported;During functional activity Standing balance-Leahy Scale: Fair Standing balance comment: using RW                             Pertinent Vitals/Pain Pain Assessment: No/denies pain    Home Living Family/patient expects to be discharged to:: Private residence Living Arrangements: Children Available Help at Discharge: Family Type of Home: House Home Access: Stairs to enter Entrance Stairs-Rails: None Entrance Stairs-Number of Steps: 1 Home Layout: Two level;Able to live on main level with bedroom/bathroom Home Equipment: Walker - 4 wheels;Bedside commode;Wheelchair - manual;Shower seat - built in  Prior Function Level of Independence: Needs assistance   Gait / Transfers Assistance Needed: Assisted for transfers and household ambulation using Rollator  ADL's / Homemaking Assistance Needed: assisted by family, takes patient to adult daycare center         Hand Dominance    Dominant Hand: Right    Extremity/Trunk Assessment   Upper Extremity Assessment Upper Extremity Assessment: Generalized weakness    Lower Extremity Assessment Lower Extremity Assessment: Generalized weakness    Cervical / Trunk Assessment Cervical / Trunk Assessment: Normal  Communication   Communication: No difficulties  Cognition   Behavior During Therapy: WFL for tasks assessed/performed Overall Cognitive Status: History of cognitive impairments - at baseline                                        General Comments      Exercises     Assessment/Plan    PT Assessment Patient needs continued PT services  PT Problem List Decreased strength;Decreased activity tolerance;Decreased balance;Decreased mobility       PT Treatment Interventions Gait training;Stair training;Functional mobility training;Therapeutic activities;Therapeutic exercise;Patient/family education    PT Goals (Current goals can be found in the Care Plan section)  Acute Rehab PT Goals Patient Stated Goal: return home with assist PT Goal Formulation: With patient/family Time For Goal Achievement: 01/10/18 Potential to Achieve Goals: Good    Frequency Min 3X/week   Barriers to discharge        Co-evaluation               AM-PAC PT "6 Clicks" Daily Activity  Outcome Measure Difficulty turning over in bed (including adjusting bedclothes, sheets and blankets)?: Unable Difficulty moving from lying on back to sitting on the side of the bed? : Unable Difficulty sitting down on and standing up from a chair with arms (e.g., wheelchair, bedside commode, etc,.)?: Unable Help needed moving to and from a bed to chair (including a wheelchair)?: A Lot Help needed walking in hospital room?: A Lot Help needed climbing 3-5 steps with a railing? : A Lot 6 Click Score: 9    End of Session Equipment Utilized During Treatment: Gait belt;Oxygen Activity Tolerance: Patient tolerated treatment  well;Patient limited by fatigue(Patient limited by SOB) Patient left: in chair;with call bell/phone within reach;with family/visitor present;with chair alarm set Nurse Communication: Mobility status PT Visit Diagnosis: Unsteadiness on feet (R26.81);Other abnormalities of gait and mobility (R26.89);Muscle weakness (generalized) (M62.81)    Time: 6546-5035 PT Time Calculation (min) (ACUTE ONLY): 32 min   Charges:   PT Evaluation $PT Eval Moderate Complexity: 1 Mod PT Treatments $Therapeutic Activity: 23-37 mins        12:26 PM, 12/27/17 Lonell Grandchild, MPT Physical Therapist with [facility] [phone] office [phone] mobile phone

## 2017-12-28 DIAGNOSIS — G40901 Epilepsy, unspecified, not intractable, with status epilepticus: Principal | ICD-10-CM

## 2017-12-28 DIAGNOSIS — F418 Other specified anxiety disorders: Secondary | ICD-10-CM

## 2017-12-28 MED ORDER — AMLODIPINE BESYLATE 5 MG PO TABS
5.0000 mg | ORAL_TABLET | Freq: Every day | ORAL | Status: DC
  Administered 2017-12-28: 5 mg via ORAL
  Filled 2017-12-28: qty 1

## 2017-12-28 MED ORDER — METOPROLOL TARTRATE 25 MG PO TABS: 25.0000 mg | ORAL_TABLET | Freq: Two times a day (BID) | ORAL | 0 refills | Status: AC

## 2017-12-28 MED ORDER — LOSARTAN POTASSIUM 50 MG PO TABS
25.0000 mg | ORAL_TABLET | Freq: Every day | ORAL | Status: DC
  Administered 2017-12-28: 25 mg via ORAL
  Filled 2017-12-28: qty 1

## 2017-12-28 MED ORDER — PREDNISONE 20 MG PO TABS: ORAL_TABLET | ORAL | 0 refills | Status: AC

## 2017-12-28 MED ORDER — LEVETIRACETAM 250 MG PO TABS: 250.0000 mg | ORAL_TABLET | Freq: Two times a day (BID) | ORAL | 0 refills | Status: AC

## 2017-12-28 MED ORDER — LOSARTAN POTASSIUM 25 MG PO TABS: 25.0000 mg | ORAL_TABLET | Freq: Every day | ORAL | 6 refills | Status: AC

## 2017-12-28 MED ORDER — AMLODIPINE BESYLATE 5 MG PO TABS: 5.0000 mg | ORAL_TABLET | Freq: Every day | ORAL | Status: DC

## 2017-12-28 MED ORDER — DOXYCYCLINE HYCLATE 100 MG PO TABS: 100.0000 mg | ORAL_TABLET | Freq: Two times a day (BID) | ORAL | 0 refills | Status: AC

## 2017-12-28 MED ORDER — AMLODIPINE BESYLATE 5 MG PO TABS: 5.0000 mg | ORAL_TABLET | Freq: Every day | ORAL | 0 refills | Status: AC

## 2017-12-28 NOTE — Care Management Note (Signed)
Case Management Note  Patient Details  Name: TERREN HABERLE MRN: [id number] Date of Birth: 1940/09/25  Subjective/Objective:     Admitted with seizures. Pt from home with hospice and has strong family support.                Action/Plan: DC home today with resumption of hospice services. CM has spoken with Hospice rep Ron, who says they are getting pt DME delivered to pt home. CM has [facility]ed [facility] summary [facility].   Expected Discharge Date:  12/28/17               Expected Discharge Plan:  Home w Hospice Care  In-House Referral:  Clinical Social Work  Discharge planning Services  CM Consult  Post Acute Care Choice:  Hospice, Resumption of Svcs/PTA Provider Choice offered to:  Patient  HH Arranged:  RN [facility] Agency:  [facility]  Status of Service:  Completed, signed off  If discussed at [employer], dates discussed:    Additional Comments:  Sherald Barge, RN 12/28/2017, 3:27 PM

## 2017-12-28 NOTE — Progress Notes (Signed)
SLP Cancellation Note  Patient Details Name: GISELLA ALWINE MRN: [id number] DOB: [birth year]   Cancelled treatment:        Family meeting with Palliative care this morning reveals intent for Pt to return home today with hospice. Defer to Hospice to continue comfort measures and comfort feeding recommendations. There are no further ST needs indicated at this time. ST to sign off. Thank you for allowing [place] to participate in the care of this patient,  Almetta Lovely. Roddie Mc, CCC-SLP Speech Language Pathologist    Wende Bushy 12/28/2017, 2:14 PM

## 2017-12-28 NOTE — Clinical Social Work Note (Signed)
LCSW spoke with son, Linna Hoff, who indicated that after the family meeting this morning with Palliative Care, they had decided to take patient home.   LCSW signing off.    Morey Andonian, Clydene Pugh, LCSW

## 2017-12-28 NOTE — Discharge Summary (Signed)
Physician Discharge Summary  [name] ZOX:[id number] DOB: May 18, 1940 DOA: 12/22/2017  PCP: Fayrene Helper, MD  Admit date: 12/22/2017 Discharge date: 12/28/2017  Admitted From: Home with hospice Disposition: Home with hospice services  Discharge Condition: HOSPICE   CODE STATUS: DNR   Diet:  Dysphagia 1   SLP Diet Recommendations: Dysphagia 1 (Puree) solids;Thin liquid   Liquid Administration via: Cup;No straw   Medication Administration: Whole meds with liquid   Supervision: Patient able to self feed;Full supervision/cueing for compensatory strategies   Compensations: Slow rate;Small sips/bites;Multiple dry swallows after each bite/sip   Postural Changes: Remain semi-upright after after feeds/meals (Comment);Seated upright at 90 degrees   Oral Care Recommendations: Oral care BID;Staff/trained caregiver to provide oral care   Other Recommendations: Clarify dietary restrictions   Brief Hospitalization Summary: Please see all hospital notes, images, labs for full details of the hospitalization. HPI: [name] is a 77 y.o. female with medical history significant for dementia, depression with anxiety, COPD, and hypertension, now presenting to the emergency department with seizure-like activity.  History is obtained from the patient's son at the bedside.  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  This lasted approximately 3 minutes.  She was placed in the private vehicle for transport to the hospital and had jerking of her left side and left-sided gaze en route.  ED Course: Upon arrival to the ED, patient is found to be afebrile, saturating well on room air, slightly tachycardic, lightly  tachypneic, and with normal blood pressure.  EKG features a sinus rhythm and noncontrast head CT is concerning for multifocal small right MCA and anterior watershed subacute infarcts, less likely metastatic disease or cerebritis.  Chemistry panel was unremarkable and CBC notable for leukocytosis to 13,200.  Patient had recurrent seizure-like activity in the ED that was aborted with 1 mg IV Ativan.  She was loaded with a gram of Keppra.  Tele-neurology evaluation was completed in the ED.  Admission to [facility] was discussed in order to obtain MRI and inpatient neurology consultation, however patient's son would prefer that she stay here for a more palliative approach, noting that they are not interested in any further evaluation such as MRI.  Brief Narrative:  78 year old female who was admitted to the hospital with seizures.  CT head showed subacute CVA.  She does have a history of dementia.  Family was not interested in further work-up including MRI or transfer to transfer for neurology consultation.  She was loaded with Keppra.  They prefer a palliative approach.  Palliative care has been consulted.  Patient is followed by hospice as an outpatient.  Currently somnolent, so Keppra dose has been reduced.  Continue to monitor.  Assessment & Plan:   Principal Problem:   Seizures ([place]) Active Problems:   Depression with anxiety   Essential hypertension, benign   COPD GOLD III with reversibility    Dementia without behavioral disturbance (HCC)   Ischemic cerebrovascular accident (CVA) ([place])   Status epilepticus ([place])   Dysphagia   Pneumonia of right upper lobe due to infectious organism ([place])   1. Seizures.  Patient presented with seizure activity.  These resolved after receiving Ativan.  CT  head showed new lesion suspected to reflect subacute CVA.  She was loaded with 1 g of IV Keppra and continued on 500 mg of Keppra twice daily.  She  remained somnolent the following day.  Keppra  dose was decreased to 250 mg twice daily.  Her overall mental status has appears to be improving.  Patient seems closer to her baseline mental status at this time.  Continue seizure precautions.  Transitioned Keppra to p.o. once cleared by speech therapy. Family was not interested in transfer to [facility] for neurology consultation or MRI.  2. Dysphagia - Pt was seen by speech therapist and had a modified barium swallow study.  The patient has been placed on a dysphasia 1 diet as noted above with aspiration precautions.  Family made aware of dysphasia diet and verbalized understanding.  Patient's family taking her home with hospice services. 3. Acute encephalopathy.    Resolved now.  Likely related to seizures as well as medication such as Keppra.  Urinalysis did not show any evidence of infection, and urine culture did not show any growth.  Overall mental status appears to be improving.  Keppra dose was decreased to 250 mg twice daily. 4. CVA.  Presents with new onset seizures.  CT findings suggestive of new ischemic infarcts.  Although metastatic disease is also possibility.  Family not interested in pursuing further MRI or favoring a palliative approach.  She is followed by hospice as an outpatient.  Family has decided to take the patient home with hospice services. 5. COPD exacerbation.  Improved with intravenous steroids.  Continue Spiriva and albuterol and 7-day course of oral prednisone and finish course of doxycycline. 6. Pneumonia.  Chest x-ray indicates developing pneumonia.  Continue doxycycline.  Patient clinically is much improved. 7. Hypertension.  Blood pressure remains elevated.   BP better controlled.    She was started on additional amlodipine.  She was also started on metoprolol.  She seems to be tolerating these. 8. Dementia.  Continue Aricept. 9. Goals of care.  Palliative care following to assist with further disposition.  She is currently DNR and family is taking home with home  hospice services.     DVT prophylaxis: Lovenox Code Status: DNR Family Communication: Discussed with daughter-in-law Disposition Plan:  Home with hospice services  Consultants:   Palliative care  Procedures:  N/A  Antimicrobials:   Doxycycline 11/6 >  Discharge Diagnoses:  Principal Problem:   Seizures ([place]) Active Problems:   Depression with anxiety   Essential hypertension, benign   COPD GOLD III with reversibility    Dementia without behavioral disturbance ([place])   Ischemic cerebrovascular accident (CVA) ([place])   Status epilepticus ([place])   Dysphagia   Pneumonia of right upper lobe due to infectious organism [facility])    Discharge Instructions: Discharge Instructions    Increase activity slowly   Complete by:  As directed      Allergies as of 12/28/2017      Reactions   Levofloxacin Anaphylaxis   Nitrofurantoin Shortness Of Breath   Acute dyspnea   Bupropion Hcl Other (See Comments)   unknown   Cyclobenzaprine Hcl Other (See Comments)   unknown   Gadolinium     Desc: PT REFUSES MR & CT CONTRAST DUE TO PRIOR REACTION, PT STATES HAIR FELL OUT & SKIN CHANGED COLOR   Penicillins Other (See Comments)   Child hood allergy Has patient had a PCN reaction causing immediate rash, facial/tongue/throat swelling, SOB or lightheadedness with hypotension: Yes Has patient had  a PCN reaction causing severe rash involving mucus membranes or skin necrosis: No Has patient had a PCN reaction that required hospitalization: No Has patient had a PCN reaction occurring within the last 10 years: No If all of the above answers are "NO", then may proceed with Cephalosporin use.   Statins Other (See Comments)   Joint stiffness   Sulfonamide Derivatives Other (See Comments)   unknown      Medication List    TAKE these medications   albuterol (2.5 MG/3ML) 0.083% nebulizer solution Commonly known as:  PROVENTIL TAKE 3 MLS (2.5 MG TOTAL) BY NEBULIZATION 3 (THREE) TIMES DAILY.    amLODipine 5 MG tablet Commonly known as:  NORVASC Take 1 tablet (5 mg total) by mouth daily. Start taking on:  12/29/2017   aspirin 81 MG tablet Take 1 tablet (81 mg total) by mouth daily.   donepezil 10 MG tablet Commonly known as:  ARICEPT TAKE 1 TABLET BY MOUTH AT BEDTIME.   doxycycline 100 MG tablet Commonly known as:  VIBRA-TABS Take 1 tablet (100 mg total) by mouth every 12 (twelve) hours for 5 days.   levETIRAcetam 250 MG tablet Commonly known as:  KEPPRA Take 1 tablet (250 mg total) by mouth 2 (two) times daily.   losartan 25 MG tablet Commonly known as:  COZAAR Take 1 tablet (25 mg total) by mouth daily. What changed:    how much to take  Another medication with the same name was removed. Continue taking this medication, and follow the directions you see here.   metoprolol tartrate 25 MG tablet Commonly known as:  LOPRESSOR Take 1 tablet (25 mg total) by mouth 2 (two) times daily.   mirtazapine 7.5 MG tablet Commonly known as:  REMERON TAKE 1 TABLET BY MOUTH EVERYDAY AT BEDTIME   predniSONE 20 MG tablet Commonly known as:  DELTASONE Take 2 po daily with breakfast x 7 days.   sertraline 25 MG tablet Commonly known as:  ZOLOFT TAKE 1 TABLET BY MOUTH EVERY DAY   tiotropium 18 MCG inhalation capsule Commonly known as:  SPIRIVA INHALE 1 CAPSULE WITH HANDIHALER AT THE SAME TIME EVERY DAY   UNABLE TO FIND Nebulizer kit and tubing. (please give child-size tubing) DX: J44.9       Allergies  Allergen Reactions  . Levofloxacin Anaphylaxis  . Nitrofurantoin Shortness Of Breath    Acute dyspnea  . Bupropion Hcl Other (See Comments)    unknown  . Cyclobenzaprine Hcl Other (See Comments)    unknown  . Gadolinium      Desc: PT REFUSES MR & CT CONTRAST DUE TO PRIOR REACTION, PT STATES HAIR FELL OUT & SKIN CHANGED COLOR   . Penicillins Other (See Comments)    Child hood allergy Has patient had a PCN reaction causing immediate rash, facial/tongue/throat  swelling, SOB or lightheadedness with hypotension: Yes Has patient had a PCN reaction causing severe rash involving mucus membranes or skin necrosis: No Has patient had a PCN reaction that required hospitalization: No Has patient had a PCN reaction occurring within the last 10 years: No If all of the above answers are "NO", then may proceed with Cephalosporin use.  . Statins Other (See Comments)    Joint stiffness  . Sulfonamide Derivatives Other (See Comments)    unknown   Allergies as of 12/28/2017      Reactions   Levofloxacin Anaphylaxis   Nitrofurantoin Shortness Of Breath   Acute dyspnea   Bupropion Hcl Other (See Comments)  unknown   Cyclobenzaprine Hcl Other (See Comments)   unknown   Gadolinium     Desc: PT REFUSES MR & CT CONTRAST DUE TO PRIOR REACTION, PT STATES HAIR FELL OUT & SKIN CHANGED COLOR   Penicillins Other (See Comments)   Child hood allergy Has patient had a PCN reaction causing immediate rash, facial/tongue/throat swelling, SOB or lightheadedness with hypotension: Yes Has patient had a PCN reaction causing severe rash involving mucus membranes or skin necrosis: No Has patient had a PCN reaction that required hospitalization: No Has patient had a PCN reaction occurring within the last 10 years: No If all of the above answers are "NO", then may proceed with Cephalosporin use.   Statins Other (See Comments)   Joint stiffness   Sulfonamide Derivatives Other (See Comments)   unknown      Medication List    TAKE these medications   albuterol (2.5 MG/3ML) 0.083% nebulizer solution Commonly known as:  PROVENTIL TAKE 3 MLS (2.5 MG TOTAL) BY NEBULIZATION 3 (THREE) TIMES DAILY.   amLODipine 5 MG tablet Commonly known as:  NORVASC Take 1 tablet (5 mg total) by mouth daily. Start taking on:  12/29/2017   aspirin 81 MG tablet Take 1 tablet (81 mg total) by mouth daily.   donepezil 10 MG tablet Commonly known as:  ARICEPT TAKE 1 TABLET BY MOUTH AT BEDTIME.    doxycycline 100 MG tablet Commonly known as:  VIBRA-TABS Take 1 tablet (100 mg total) by mouth every 12 (twelve) hours for 5 days.   levETIRAcetam 250 MG tablet Commonly known as:  KEPPRA Take 1 tablet (250 mg total) by mouth 2 (two) times daily.   losartan 25 MG tablet Commonly known as:  COZAAR Take 1 tablet (25 mg total) by mouth daily. What changed:    how much to take  Another medication with the same name was removed. Continue taking this medication, and follow the directions you see here.   metoprolol tartrate 25 MG tablet Commonly known as:  LOPRESSOR Take 1 tablet (25 mg total) by mouth 2 (two) times daily.   mirtazapine 7.5 MG tablet Commonly known as:  REMERON TAKE 1 TABLET BY MOUTH EVERYDAY AT BEDTIME   predniSONE 20 MG tablet Commonly known as:  DELTASONE Take 2 po daily with breakfast x 7 days.   sertraline 25 MG tablet Commonly known as:  ZOLOFT TAKE 1 TABLET BY MOUTH EVERY DAY   tiotropium 18 MCG inhalation capsule Commonly known as:  SPIRIVA INHALE 1 CAPSULE WITH HANDIHALER AT THE SAME TIME EVERY DAY   UNABLE TO FIND Nebulizer kit and tubing. (please give child-size tubing) DX: J44.9       Procedures/Studies: Dg Chest Port 1 View  Result Date: 12/26/2017 CLINICAL DATA:  Swelling function test done today. Now wheezing. EXAM: PORTABLE CHEST 1 VIEW COMPARISON:  12/19/2016 FINDINGS: Patient rotation limits examination. Postoperative changes in the mediastinum. Emphysematous changes in the lungs. Peribronchial thickening and mild streaky infiltration particularly in the right upper lung. This may represent developing pneumonia. No blunting of costophrenic angles. No pneumothorax. Mediastinal contours appear intact. Normal heart size and pulmonary vascularity. Calcification of the aorta. IMPRESSION: Emphysematous changes in the lungs. Peribronchial thickening with streaky infiltration in the right upper lung suggesting developing pneumonia. Electronically  Signed   By: Lucienne Capers M.D.   On: 12/26/2017 02:53   Dg Swallowing Func-speech Pathology  Result Date: 12/27/2017 Objective Swallowing Evaluation: Type of Study: MBS-Modified Barium Swallow Study  Patient Details Name: Nikiyah Fackler  [place] MRN: [id number] Date of Birth: [birth year] Today's Date: 12/27/2017 Time: SLP Start Time (ACUTE ONLY): 1400 -SLP Stop Time (ACUTE ONLY): 1425 SLP Time Calculation (min) (ACUTE ONLY): 25 min Past Medical History: Past Medical History: Diagnosis Date . Anxiety  . Aortic stenosis   Status post bioprosthetic AVR 2004 . Arthritis  . COPD with chronic bronchitis ([place])  . Dementia ([place])  . Depression  . Essential hypertension  . History of hyperthyroidism 2012  Status post ablation . Hyperlipidemia  . Osteoporosis  . Renal insufficiency  . Rheumatic fever  . Sebaceous cyst  . Seizures ([place])  . Urosepsis 2012  Recurrent Past Surgical History: Past Surgical History: Procedure Laterality Date . AORTIC VALVE REPLACEMENT    53m porcine 10/04 . APPENDECTOMY   . BREAST LUMPECTOMY   . CARDIAC VALVE REPLACEMENT   . CHOLECYSTECTOMY   . COLON SURGERY  2005 . COLONOSCOPY  07/23/2008   Single diminutive polyp in the rectum status post cold biopsy removal/ Status post right hemicolectomy/ Colonic ulcers in the upstream end the colon distal to the  anastomosis as described above.  Status post biopsy. The anastomosis itself and neoterminal ileum appeared normal . ESOPHAGOGASTRODUODENOSCOPY  10/12/09  short segment Barrett's esophage/small hiatal hernia,antral ersions . LUMBAR LAMINECTOMY   . TONSILLECTOMY   HPI: 77year old female who was admitted to the hospital with seizures.  CT head showed subacute CVA.  She does have a history of dementia.  Family was not interested in further work-up including MRI or transfer to transfer for neurology consultation.  She was loaded with Keppra.  They prefer a palliative approach.  Palliative care has been consulted.  Patient is followed by hospice as an outpatient.   Currently somnolent, so Keppra dose has been reduced.  Continue to monitor.  Subjective: "yes" Assessment / Plan / Recommendation CHL IP CLINICAL IMPRESSIONS 12/27/2017 Clinical Impression Pt presents with mild oropharyngeal dysphagia with greatest risk being Pt's shortness of breath and increased respiratory rate. Pt assessed with barium tinged thin (via teaspoon, cup, and straw), nectar thick, puree, mechanical soft, and barium tablet with cup sips thin. Swallow trigger occurred at the level of the pyriforms with thin liquids and at the level of the valleculae with NTLs, puree, and regular textures and resulted in good hyolaryngeal excursion, epiglottic deflection, and laryngeal vestibule closure. Pt did have min oral residuals with liquids which spilled to valleculae and pyriforms after the primary swallow and remained there for up to 10 seconds without a spontaneous swallow to clear. SLP cued Pt to repeat swallow which she was able to do after significant delay. Residuals were cleared after a secondary swallow. Pt appears at highest risk for aspiration with use of thin liquids by straw due to moderate residuals in pharynx noted when Pt takes sequential swallows and failed to initiate a timely swallow for the residuals. Pt with one episode of flash penetration (underepiglottic coating only) when swallowing residuals from straw sip. Pt also assessed with NTL and found to have oral residuals/base of tongue residuals in mi/mod amount which settled in valleculae and pyriforms after the initial swallow.  Cervicoesophageal phase marked by prominent cricopharyngeus. Pt is at risk for aspiration given poor respiratory support, however she demonstrates good airway protection. Recommend D1/puree due to increased RR and for energy conservation purposes and continue with thin liquids and NO STRAWS. Encourage small sips and cue to swallow 2x for each sip to clear residuals. Pt would benefit from f/u SLP services at next venue  of care  to ensure diet tolerance and to advance as appropriate. PO meds whole with water.  SLP Visit Diagnosis Dysphagia, oropharyngeal phase (R13.12) Attention and concentration deficit following -- Frontal lobe and executive function deficit following -- Impact on safety and function Mild aspiration risk;Risk for inadequate nutrition/hydration   CHL IP TREATMENT RECOMMENDATION 12/27/2017 Treatment Recommendations Therapy as outlined in treatment plan below   Prognosis 12/27/2017 Prognosis for Safe Diet Advancement Fair Barriers to Reach Goals (No Data) Barriers/Prognosis Comment -- CHL IP DIET RECOMMENDATION 12/27/2017 SLP Diet Recommendations Dysphagia 1 (Puree) solids;Thin liquid Liquid Administration via Cup;No straw Medication Administration Whole meds with liquid Compensations Slow rate;Small sips/bites;Multiple dry swallows after each bite/sip Postural Changes Remain semi-upright after after feeds/meals (Comment);Seated upright at 90 degrees   CHL IP OTHER RECOMMENDATIONS 12/27/2017 Recommended Consults -- Oral Care Recommendations Oral care BID;Staff/trained caregiver to provide oral care Other Recommendations Clarify dietary restrictions   CHL IP FOLLOW UP RECOMMENDATIONS 12/27/2017 Follow up Recommendations 24 hour supervision/assistance;Skilled Nursing facility   CHL IP FREQUENCY AND DURATION 12/27/2017 Speech Therapy Frequency (ACUTE ONLY) min 2x/week Treatment Duration 1 week      CHL IP ORAL PHASE 12/27/2017 Oral Phase Impaired Oral - Pudding Teaspoon -- Oral - Pudding Cup -- Oral - Honey Teaspoon -- Oral - Honey Cup -- Oral - Nectar Teaspoon -- Oral - Nectar Cup -- Oral - Nectar Straw -- Oral - Thin Teaspoon -- Oral - Thin Cup -- Oral - Thin Straw Lingual/palatal residue Oral - Puree -- Oral - Mech Soft -- Oral - Regular -- Oral - Multi-Consistency -- Oral - Pill -- Oral Phase - Comment lingual residuals from liquids spill to valleculae and pyriforms and Pt required cues to swallow to clear  CHL IP  PHARYNGEAL PHASE 12/27/2017 Pharyngeal Phase Impaired Pharyngeal- Pudding Teaspoon -- Pharyngeal -- Pharyngeal- Pudding Cup -- Pharyngeal -- Pharyngeal- Honey Teaspoon -- Pharyngeal -- Pharyngeal- Honey Cup -- Pharyngeal -- Pharyngeal- Nectar Teaspoon -- Pharyngeal -- Pharyngeal- Nectar Cup Delayed swallow initiation-vallecula;Pharyngeal residue - valleculae Pharyngeal -- Pharyngeal- Nectar Straw -- Pharyngeal -- Pharyngeal- Thin Teaspoon Delayed swallow initiation-pyriform sinuses Pharyngeal -- Pharyngeal- Thin Cup Delayed swallow initiation-pyriform sinuses;Pharyngeal residue - valleculae;Pharyngeal residue - pyriform Pharyngeal -- Pharyngeal- Thin Straw Delayed swallow initiation-pyriform sinuses;Penetration/Aspiration during swallow;Pharyngeal residue - valleculae;Pharyngeal residue - pyriform Pharyngeal Material does not enter airway;Material enters airway, remains ABOVE vocal cords then ejected out Pharyngeal- Puree Delayed swallow initiation-vallecula;WFL Pharyngeal -- Pharyngeal- Mechanical Soft WFL;Delayed swallow initiation-vallecula Pharyngeal -- Pharyngeal- Regular -- Pharyngeal -- Pharyngeal- Multi-consistency -- Pharyngeal -- Pharyngeal- Pill WFL;Delayed swallow initiation-vallecula Pharyngeal -- Pharyngeal Comment oral residuals spill to pharynx after the swallow with delay in secondary swallow to clear  CHL IP CERVICAL ESOPHAGEAL PHASE 12/27/2017 Cervical Esophageal Phase Impaired Pudding Teaspoon -- Pudding Cup -- Honey Teaspoon -- Honey Cup -- Nectar Teaspoon -- Nectar Cup -- Nectar Straw -- Thin Teaspoon -- Thin Cup Prominent cricopharyngeal segment Thin Straw -- Puree -- Mechanical Soft -- Regular -- Multi-consistency -- Pill -- Cervical Esophageal Comment -- Thank you, Genene Churn, [place] 12/27/2017, 5:48 PM              Ct Head Code Stroke Wo Contrast  Result Date: 12/23/2017 CLINICAL DATA:  Code stroke. Seizures. History of hypertension and dementia. EXAM: CT  HEAD WITHOUT CONTRAST TECHNIQUE: Contiguous axial images were obtained from the base of the skull through the vertex without intravenous contrast. COMPARISON:  MRI head October 29, 2013 FINDINGS: Moderately motion degraded examination. BRAIN: New multifocal RIGHT frontotemporal focal blurring of  the gray-white matter junction with subcortical edema. Small area RIGHT parietal encephalomalacia. Confluent supratentorial white matter hypodensities. Old small cerebellar infarcts. No acute large vascular territory infarcts. No abnormal extra-axial fluid collections. Basal cisterns are patent. 12 mm LEFT posterior fossa calcified meningioma without mass effect, unchanged. VASCULAR: Moderate calcific atherosclerosis of the carotid siphons. SKULL: No skull fracture. No significant scalp soft tissue swelling. SINUSES/ORBITS: Severe chronic LEFT maxillary sinusitis. Mastoid air cells are well aerated. Soft tissue RIGHT external auditory canal most compatible with cerumen.The included ocular globes and orbital contents are non-suspicious. Status post bilateral ocular lens implants. OTHER: None. IMPRESSION: 1. Motion degraded examination. Multifocal small RIGHT MCA and anterior watershed territory subacute infarcts, less likely metastasis or cerebritis. MRI of the brain with contrast would better characterize. 2. Moderate to severe chronic small vessel ischemic changes. Old small cerebellar infarcts. 3. Critical Value/emergent results were called by telephone at the time of interpretation on 12/23/2017 at 1:15 am to Dr. Ezequiel Essex , who verbally acknowledged these results. Electronically Signed   By: Elon Alas M.D.   On: 12/23/2017 01:17      Subjective: Patient says she is not having any trouble breathing today.  She feels comfortable.  She has no chest pain and no shortness of breath.  Discharge Exam: Vitals:   12/28/17 1406 12/28/17 1430  BP: (!) 167/76   Pulse: 91   Resp:    Temp:    SpO2: 100% 98%    Vitals:   12/28/17 0749 12/28/17 0816 12/28/17 1406 12/28/17 1430  BP:  (!) 172/70 (!) 167/76   Pulse:  (!) 102 91   Resp:      Temp:      TempSrc:      SpO2: 99%  100% 98%  Weight:      Height:       General exam: Alert, awake, no distress, vocalizing.  Respiratory system: BBS fairly clear.   Cardiovascular system:normal s1,s2 sounds. No murmurs, rubs, gallops. Gastrointestinal system: Abdomen is nondistended, soft and nontender. No organomegaly or masses felt. Normal bowel sounds heard. Central nervous system:  No focal neurological deficits. Extremities: No C/C/E, +pedal pulses Skin: No rashes, lesions or ulcers Psychiatry: dementia  The results of significant diagnostics from this hospitalization (including imaging, microbiology, ancillary and laboratory) are listed below for reference.     Microbiology: Recent Results (from the past 240 hour(s))  Culture, Urine     Status: None   Collection Time: 12/24/17  5:13 PM  Result Value Ref Range Status   Specimen Description   Final    URINE, CATHETERIZED Performed at [facility], [address] [place], [place] [address]    Special Requests   Final    NONE Performed at [facility], [address] [place], [place] [address]    Culture   Final    NO GROWTH Performed at [facility], [place] [address], [place], [place] [address]    Report Status 12/26/2017 FINAL  Final     Labs: BNP (last 3 results) No results for input(s): BNP in the last 8760 hours. Basic Metabolic Panel: Recent Labs  Lab 12/23/17 0013 12/23/17 0019  NA 138 140  K 3.8 4.0  CL 104 104  CO2 25  --   GLUCOSE 159* 156*  BUN 12 13  CREATININE 0.70 0.70  CALCIUM 9.0  --    Liver Function Tests: Recent Labs  Lab 12/23/17 0013  AST 18  ALT 11  ALKPHOS 102  BILITOT  0.7  PROT 7.0  ALBUMIN 3.6   No results for input(s): LIPASE, AMYLASE in the last 168 hours. No results for input(s): AMMONIA in the last 168 hours. CBC: Recent Labs   Lab 12/23/17 0013 12/23/17 0019  WBC 13.2*  --   NEUTROABS 10.8*  --   HGB 12.6 13.6  HCT 41.7 40.0  MCV 92.1  --   PLT 357  --    Cardiac Enzymes: No results for input(s): CKTOTAL, CKMB, CKMBINDEX, TROPONINI in the last 168 hours. BNP: Invalid input(s): POCBNP CBG: No results for input(s): GLUCAP in the last 168 hours. D-Dimer No results for input(s): DDIMER in the last 72 hours. Hgb A1c No results for input(s): HGBA1C in the last 72 hours. Lipid Profile No results for input(s): CHOL, HDL, LDLCALC, TRIG, CHOLHDL, LDLDIRECT in the last 72 hours. Thyroid function studies No results for input(s): TSH, T4TOTAL, T3FREE, THYROIDAB in the last 72 hours.  Invalid input(s): FREET3 Anemia work up No results for input(s): VITAMINB12, FOLATE, FERRITIN, TIBC, IRON, RETICCTPCT in the last 72 hours. Urinalysis    Component Value Date/Time   COLORURINE YELLOW 12/23/2017 1710   APPEARANCEUR CLEAR 12/23/2017 1710   LABSPEC 1.018 12/23/2017 1710   PHURINE 5.0 12/23/2017 1710   GLUCOSEU NEGATIVE 12/23/2017 1710   HGBUR SMALL (A) 12/23/2017 1710   BILIRUBINUR NEGATIVE 12/23/2017 1710   BILIRUBINUR negative 09/02/2015 [place] 12/23/2017 1710   PROTEINUR NEGATIVE 12/23/2017 1710   UROBILINOGEN 0.2 09/02/2015 1207   UROBILINOGEN 2.0 (H) 10/28/2013 1811   NITRITE NEGATIVE 12/23/2017 1710   LEUKOCYTESUR MODERATE (A) 12/23/2017 1710   Sepsis Labs Invalid input(s): PROCALCITONIN,  WBC,  LACTICIDVEN Microbiology Recent Results (from the past 240 hour(s))  Culture, Urine     Status: None   Collection Time: 12/24/17  5:13 PM  Result Value Ref Range Status   Specimen Description   Final    URINE, CATHETERIZED Performed at [facility], [address], [place], [place] [address]    Special Requests   Final    NONE Performed at [facility], [address] [place], [place] [address]    Culture   Final    NO GROWTH Performed at [facility], [place] [address],  [place], [place] [address]    Report Status 12/26/2017 FINAL  Final   Time coordinating discharge: 35 minutes  SIGNED:  Irwin Brakeman, MD  [facility] 12/28/2017, 2:52 PM Pager [phone]  If 7PM-7AM, please contact night-coverage [email or web address] Password [email or web address]

## 2017-12-28 NOTE — Progress Notes (Signed)
Palliative:  I met today at Ms. [name] bedside as she slept with son, sister, and daughter-in-law Gerilyn Nestle. Gerilyn Nestle is her main caregiver. We were joined by hospice representatives as well. We discussed in detail her pneumonia, COPD exacerbation, dysphagia/diet recommendations and treatments as well as reviewed her urine culture. We discussed disposition options and they have opted to return home with hospice and continue at the [facility] in 1-2 weeks after a rest period at home. They had many good questions that were appropriate and answered to the best of my ability and to their satisfaction. Hospice is helping to arrange more equipment in the home now that care needs have increased. She will require EMS transport to return home. They anticipate discharge for today. Emotional support provided.   Exam: Lethargic, sleeping. No distress. Breathing is easy, unlabored, and no audible wheezing.   67 min  Vinie Sill, NP Palliative Medicine Team Pager # [phone] (M-F 8a-5p) Team Phone # [phone] (Nights/Weekends)

## 2017-12-28 NOTE — Progress Notes (Signed)
Patient is to be discharged home and in stable condition. Patient's IV removed, WNL. Patient's family given discharge instructions and verbalized understanding. Patient discharged via EMS. Patient taken without signed DNR because Dr.Johnson was no longer in the building. Family made aware of the risk of taking her without a DNR, and verbalized understanding. EMS agreeable to taking her at the family's wishes.  Celestia Khat, RN

## 2017-12-28 NOTE — Discharge Instructions (Signed)
Dysphagia Diet Level 1, Pureed The dysphasia level 1 diet includes foods that are completely pureed and smooth. The foods have a pudding-like texture, such as the texture of pureed pancakes, mashed potatoes, and yogurt. The diet does not include foods with lumps or coarse textures. Liquids should be smooth and may either be thin, nectar-thick, honey-like, or spoon-thick. This diet is helpful for people with moderate to severe swallowing problems. It reduces the risk of food getting caught in the windpipe, trachea, or lungs. You may need help or supervision during meals while following this diet. What do I need to know about this diet? Foods  You may eat foods that are soft and have a pudding-like texture. If a food does not have this texture, you may be able to eat the food after: ? Pureeing it. This can be done with a blender or whisk. ? Moistening it with liquid. For example, you may have bread if you soak it in milk or syrup.  Avoid foods that are hard, dry, sticky, chunky, lumpy, or stringy. Also avoid foods with nuts, seeds, raisins, skins, and pulp.  Do not eat foods that you have to chew. If you have to chew the food, then you cannot eat it.  Eat a variety of foods to get all the nutrients you need. Liquids  You may drink liquids that are smooth. Your health care provider will tell you if you should drink thin or thickened liquids.  To thicken a liquid, use a food and beverage thickener or a thickening food. Thickened liquids are usually a pudding-like consistency.  Thin liquids include fruit juices, milk, coffee, tea, yogurts, shakes, and similar foods that melt to thin liquid at room temperature.  Avoid liquids with seeds, pulp, or chunks. See your dietitian or health care provider regularly for help with your dietary changes. What foods can I eat? Grains Store-bought soft breads, pancakes, and [place] toast that have a smooth, moist texture and do not have nuts or seeds (you  will need to moisten the food with liquid). Cooked cereals that have a pudding-like consistency, such as cream of wheat or farina (no oatmeal). Pureed, well-cooked pasta, rice, and plain bread stuffing. Vegetables Pureed vegetables. Soft avocado. Smooth tomato paste or sauce. Strained or pureed soups (these may need to be thickened as directed). Mashed or pureed potatoes without skin (can be seasoned with butter, smooth gravy, margarine, or sour cream). Fruits Pureed fruits such as melons and apples without seeds or pulp. Mashed bananas. Smooth tomato paste or sauce. Fruit juices without pulp or seeds. Strained or pureed soups. Meat and Other Protein Sources Pureed meat. Smooth pate or liverwurst. Smooth souffles. Pureed beans (such as lentils). Pureed eggs. Dairy Yogurt. Smooth cheese sauces. Milk (may need to be thickened). Nutritional dairy drinks or shakes. Ask your health care provider whether you can have ice cream. Condiments Finely ground salt, pepper, and other ground spices. Sweets/Desserts Smooth puddings and custards. Pureed desserts. Souffles. Whipped topping. Ask your health care provider whether you can have frozen desserts. Fats and Oils Butter. Margarine. Smooth and strained gravy. Sour cream. Mayonnaise. Cream cheese. Whipped topping. Smooth sauces (such as white sauce, cheese sauce, or hollandaise sauce). The items listed above may not be a complete list of recommended foods or beverages. Contact your dietitian for more options. What foods are not recommended? Grains Oatmeal. Dry cereals. Hard breads. Vegetables Whole vegetables. Stringy vegetables (such as celery). Thin tomato sauce. Fruits Whole fresh, frozen, canned, or dried fruits that have  not been pureed. Stringy fruits (such as pineapple). Meat and Other Protein Sources Whole or ground meat, fish, or poultry. Dried or cooked lentils or legumes that have been cooked but not mashed or pureed. Non-pureed eggs. Nuts and  seeds. Peanut butter. Dairy Non-pureed cheese. Dairy products with lumps or chunks. Ask your health care provider whether you can have ice cream. Condiments Coarse or seeded herbs and spices. Sweets/Desserts [place] preserves. Jams with seeds. Solid desserts. Sticky, chewy sweets (such as licorice and caramel). Ask your health care provider whether you can have frozen desserts. Fats and Oils Sauces of fats with lumps or chunks. The items listed above may not be a complete list of foods and beverages to avoid. Contact your dietitian for more information. This information is not intended to replace advice given to you by your health care provider. Make sure you discuss any questions you have with your health care provider. Document Released: 02/06/2005 Document Revised: 07/15/2015 Document Reviewed: 01/20/2013 Elsevier Interactive Patient Education  2017 [employer] Dysphagia Dysphagia is trouble swallowing. This condition occurs when solids and liquids stick in a person's throat on the way down to the stomach, or when food takes longer to get to the stomach. You may have problems swallowing food, liquids, or both. You may also have pain while trying to swallow. It may take you more time and effort to swallow something. What are the causes? This condition is caused by:  Problems with the muscles. They may make it difficult for you to move food and liquids through the tube that connects your mouth to your stomach (esophagus). You may have ulcers, scar tissue, or inflammation that blocks the normal passage of food and liquids. Causes of these problems include: ? Acid reflux from your stomach into your esophagus (gastroesophageal reflux). ? Infections. ? Radiation treatment for cancer. ? Medicines taken without enough fluids to wash them down into your stomach.  Nerve problems. These prevent signals from being sent to the muscles of your esophagus to squeeze (contract) and move what you swallow  down to your stomach.  Globus pharyngeus. This is a common problem that involves feeling like something is stuck in the throat or a sense of trouble with swallowing even though nothing is wrong with the swallowing passages.  Stroke. This can affect the nerves and make it difficult to swallow.  Certain conditions, such as cerebral palsy or Parkinson disease.  What are the signs or symptoms? Common symptoms of this condition include:  A feeling that solids or liquids are stuck in your throat on the way down to the stomach.  Food taking too long to get to the stomach.  Other symptoms include:  Food moving back from your stomach to your mouth (regurgitation).  Noises coming from your throat.  Chest discomfort with swallowing.  A feeling of fullness when swallowing.  Drooling, especially when the throat is blocked.  Pain while swallowing.  Heartburn.  Coughing or gagging while trying to swallow.  How is this diagnosed? This condition is diagnosed by:  Barium X-ray. In this test, you swallow a white substance (contrast medium)that sticks to the inside of your esophagus. X-ray images are then taken.  Endoscopy. In this test, a flexible telescope is inserted down your throat to look at your esophagus and your stomach.  CT scans and MRI.  How is this treated? Treatment for dysphagia depends on the cause of the condition:  If the dysphagia is caused by acid reflux or infection, medicines may be  used. They may include antibiotics and heartburn medicines.  If the dysphagia is caused by problems with your muscles, swallowing therapy may be used to help you strengthen your swallowing muscles. You may have to do specific exercises to strengthen the muscles or stretch them.  If the dysphagia is caused by a blockage or mass, procedures to remove the blockage may be done. You may need surgery and a feeding tube.  You may need to make diet changes. Ask your health care provider for  specific instructions. Follow these instructions at home: Eating and drinking  Try to eat soft food that is easier to swallow.  Follow any diet changes as told by your health care provider.  Cut your food into small pieces and eat slowly.  Eat and drink only when you are sitting upright.  Do not drink alcohol or caffeine. If you need help quitting, ask your health care provider. General instructions  Check your weight every day to make sure you are not losing weight.  Take over-the-counter and prescription medicines only as told by your health care provider.  If you were prescribed an antibiotic medicine, take it as told by your health care provider. Do not stop taking the antibiotic even if you start to feel better.  Do not use any products that contain nicotine or tobacco, such as cigarettes and e-cigarettes. If you need help quitting, ask your health care provider.  Keep all follow-up visits as told by your health care provider. This is important. Contact a health care provider if:  You lose weight because you cannot swallow.  You cough when you drink liquids (aspiration).  You cough up partially digested food. Get help right away if:  You cannot swallow your saliva.  You have shortness of breath or a fever, or both.  You have a hoarse voice and also have trouble swallowing. Summary  Dysphagia is trouble swallowing. This condition occurs when solids and liquids stick in a person's throat on the way down to the stomach, or when food takes longer to get to the stomach.  Dysphagia has many possible causes and symptoms.  Treatment for dysphagia depends on the cause of the condition. This information is not intended to replace advice given to you by your health care provider. Make sure you discuss any questions you have with your health care provider. Document Released: 02/04/2000 Document Revised: 01/27/2016 Document Reviewed: 01/27/2016 Elsevier Interactive Patient  Education  2017 [place] is a service that is designed to provide people who are terminally ill and their families with medical, spiritual, and psychological support. Its aim is to improve your quality of life by keeping you as alert and comfortable as possible. Who will be my providers when I begin hospice care? Hospice teams often include:  A nurse.  A doctor. The hospice doctor will be available for your care, but you can bring your regular doctor or nurse practitioner.  Social workers.  Religious leaders (such as a Clinical biochemist).  Trained volunteers.  What roles will providers play in my care? Hospice is performed by a team of health care professionals and volunteers who:  Help keep you comfortable: ? Hospice can be provided in your home or in a homelike setting. ? The hospice staff works with your family and friends to help meet your needs. ? You will enjoy the support of loved ones by receiving much of your basic care from family and friends.  Provide pain relief and manage your  symptoms. The staff supply all necessary medicines and equipment.  Provide companionship when you are alone.  Allow you and your family to rest. They may do light housekeeping, prepare meals, and run errands.  Provide counseling. They will make sure your emotional, spiritual, and social needs and those of your family are being met.  Provide spiritual care: ? Spiritual care will be individualized to meet your needs and your family's needs. ? Spiritual care may involve:  Helping you look at what death means to you.  Helping you say goodbye to your family and friends.  Performing a specific religious ceremony or ritual.  When should hospice care begin? Most people who use hospice are believed to have fewer than 6 months to live.  Your family and health care providers can help you decide when hospice services should begin.  If your condition improves, you  may discontinue the program.  What should I consider before selecting a program? Most hospice programs are run by nonprofit, independent organizations. Some are affiliated with hospitals, nursing homes, or home health care agencies. Hospice programs can take place in the home or at a hospice center, hospital, or skilled nursing facility. When choosing a hospice program, ask the following questions:  What services are available to me?  What services will be offered to my loved ones?  How involved will my loved ones be?  How involved will my health care provider be?  Who makes up the hospice care team? How are they trained or screened?  How will my pain and symptoms be managed?  If my circumstances change, can the services be provided in a different setting, such as my home or in the hospital?  Is the program reviewed and licensed by the state or certified in some other way?  Where can I learn more about hospice? You can learn about existing hospice programs in your area from your health care providers. You can also read more about hospice online. The websites of the following organizations contain helpful information:  The [facility] and Palliative Care Organization [facility]).  The Hospice Association of America ([facility]).  The [place].  The American Cancer Society (ACS).  Hospice Net.  This information is not intended to replace advice given to you by your health care provider. Make sure you discuss any questions you have with your health care provider. Document Released: 05/26/2003 Document Revised: 09/23/2015 Document Reviewed: 12/17/2012 Elsevier Interactive Patient Education  2017 [place] What is end-of-life care? End-of-life care is the physical, emotional, mental, and spiritual care you receive during the days, weeks, or months while you are dying. Your end-of-life care team may include:  Health care providers.  A  Education officer, museum.  A spiritual adviser.  The goal of end-of-life care is to give you the highest quality of life possible at the end of your life. What are the different types of end-of-life care? There are several different kinds of care options. Palliative care This type of care does not treat or cure a disease. The goal is to manage your symptoms. These may include:  Pain.  Constipation.  Nausea.  Palliative care teams often also include support for family members and loved ones. You might need palliative care for months or years. Hospice care This is a kind of palliative care ordered and provided by your health care providers. A hospice care team can also help and support your loved ones. Comfort care This type of care is for  meeting your basic needs and maintaining your overall comfort at the end of your life. This includes caring for your:  Skin.  Breathing.  Nutrition.  Rest.  Temperature.  A plan for comfort care can also address the mental, emotional, and spiritual issues that may come up at the end of your life. Where does end-of-life care take place? End-of-life care can take place wherever you are living, as long as you get the care you need. End-of-life care can happen:  At your home.  In a nursing home.  In a hospital.  In a critical care unit.  You and your loved ones might be able to decide where end-of-life care takes place. This decision depends on:  Your comfort.  Your wishes.  The medical equipment you need.  How do I know when it is time for end-of-life care? Your health care provider might tell you that there are no more treatments left to try or that treatment can no longer control your illness. Or, you may decide that you do not want to undergo the treatments that are available. Talk to your health care provider and your loved ones about your end-of-life care options. If possible, have this conversation before you need this type of care.  Discuss:  How much medical treatment you want during end-of-life care.  Where you would like to live while you are dying.  What kinds of treatments you would accept to keep you comfortable.  Which treatments you would refuse.  Your faith or spiritual needs at the end of your life.  Who will handle practical details, such as wills and finances.  You can create legal documents (advance directives) to let your loved ones know your wishes for end-of-life care. Talk to your health care provider or a lawyer about making a living will that explains your medical wishes. You can also have a medical power of attorney. This designates a person to make health decisions for you if you cannot make them yourself. This information is not intended to replace advice given to you by your health care provider. Make sure you discuss any questions you have with your health care provider. Document Released: 09/03/2013 Document Revised: 01/21/2016 Document Reviewed: 05/02/2013 Elsevier Interactive Patient Education  2017 [place] How will my loved one be cared for?  Talk with your loved one about how he or she would like to be cared for in his or her last days. Care options may include:  Home care. Many people choose to die in their own home or in the home of a family member. This may require family members to take on the role of caregivers.  Hospice care. Hospice is a service that is designed to provide medical, spiritual, and psychological support to people who are terminally ill and their families. Hospice care can take place in a variety of settings, including at home.  Hospital care. Some people prefer the comfort of having nurses and doctors nearby at all times.  Nursing home care. Nursing homes have medical staff on duty at all times.  Spiritual care. A priest, minister, or spiritual leader can be included as a member of the caregiving team to provide  guidance and counseling along the way.  You may choose to combine several care options. What changes will I see in my loved one? Your loved one will go through some changes when death is near. He or she may:  Sleep more.  Urinate  less and have fewer bowel movements.  Have mucus in the mouth.  Feel colder and look bluer in color.  Be restless and behave unusually. For example, he or she may pull on the sheets, talk to people who are not in the room, or talk to people who have already died.  Have breathing changes. Breathing may not be as deep, or it may become deeper and sound like snoring. There may be as many as 30 seconds between breaths. Breathing changes may come and go.  How can I help? Here are some things that you can do to keep your loved one comfortable:  Keep a light on in your loved one's room. He or she may not be able to see well when awake.  Wait until your loved one wakes up to give any needed medicine.  Do not force your loved one to eat, drink, or take medicine.  If your loved one has thick mucus in the throat, it may help to raise his or her head and shoulders on pillows.  Put bed pads under your loved one. Change them when needed to keep them clean and dry.  If your loved one talks to people who are not there, do not try to correct him or her.  Comfort your loved one if he or she seems scared.  Call a health care provider if your loved one expresses having a need to urinate and being unable to do so.  Keep your loved one's mouth moist for comfort. To do this you may: ? Offer chips of ice if he or she is still able to chew. ? Put petroleum jelly on his or her lips to keep them from being too dry. ? Gently wipe out his or her mouth with wet mouth sponges. You can get these sponges from your health care provider.  What happens at the time of death? At the time of death:  Breathing stops.  The heart stops beating.  The eyes may be partly open.  The  mouth may drop open a little.  These are all natural occurrences. Feelings of grief or relief at this time are normal. Talk with your caregiving team about any questions or needs you have. This information is not intended to replace advice given to you by your health care provider. Make sure you discuss any questions you have with your health care provider. Document Released: 01/25/2009 Document Revised: 11/17/2015 Document Reviewed: 01/14/2014 Elsevier Interactive Patient Education  04-29-2016 [employer].   Death and Dying When a person's health care team determines that a terminal illness can no longer be controlled, medical testing and treatment often stop. But the person's care continues. The care focuses on making the person comfortable. The person receives medicines and treatments to control pain and other symptoms, such as constipation, nausea, and shortness of breath. Some people remain at home during this time, while others enter a hospice or other facility. Either way, services are available to help individuals and their families with the medical, psychological, and spiritual issues surrounding dying. A hospice team often provides such services. The time at the end of life is different for each person. Individuals and their families have unique needs for information and support. Family members often want to know how long their loved one is expected to live. This is a hard question to answer. Factors such as where the disease is located and whether the person has other illnesses can affect how long a person is expected to live.  Questions and concerns about the end of life should be discussed with the health care team as they arise. When to ask for assistance When caring for your loved one at home, there may be times when you need assistance from your loved one's health care team. You can contact the health care team for help in any of the following situations:  Your loved one is in pain that is  not relieved by the prescribed dose of pain medicine.  Your loved one shows discomfort, such as grimacing or moaning.  Your loved one is having trouble breathing and seems upset.  Your loved one is unable to urinate or empty the bowels.  Your loved one has fallen.  Your loved one is very depressed or talking about committing suicide.  You have difficulty giving medicine to your loved one.  You are overwhelmed by caring for your loved one or are too grieved or afraid to be with your loved one.  Any time you do not know how to handle a situation.  Providing care and comfort Managing pain  Contact the health care provider if the prescribed pain-relieving medicine dose does not seem to help. With the help of the health care team, you can also explore methods such as massage and relaxation techniques to help with pain.  Reposition your loved ones body from one side to the back to the other side every few hours to prevent bed sores. Try to minimize pressure under heels and elbows by placing a pillow or foam pads where needed. Giving comfort  Keep your loved one as clean, dry, and comfortable as possible. Place disposable pads on the bed beneath the person and remove them when they become soiled.  Blankets can be used to warm your loved one. Although your loved one's skin may be cool, he or she is usually not aware of feeling cold. You should avoid warming your loved one with electric blankets or heating pads. These can cause burns.  Breathing may be easier if you turn your loved one's body to the side and place pillows beneath the head and behind the back. Although labored breathing can sound very distressing to you, gurgling and rattling sounds do not cause discomfort to your loved one. An external source of oxygen may benefit some individuals. If your loved one is able to swallow, ice chips also may help. In addition, a cool mist humidifier may help make your loved one's breathing more  comfortable. You may also use a fan to circulate the air.  Allow your loved one to choose if and when to eat or drink. Ice chips, water, or juice may be refreshing if the person can swallow. Keep the person's mouth and lips moist with products such as glycerin swabs and lip balm. Emotional support Everyone has different needs, but some emotions are common to most individuals who are dying. These include fear of abandonment and fear of being a burden. They also have concerns about loss of dignity and loss of control. Some ways you can provide comfort are as follows:  Keep your loved one company. Talk, watch movies, read, or just be with him or her.  Allow your loved one to express fears and concerns about dying, such as leaving family and friends behind. Be prepared to listen.  Be willing to reminisce about your loved one's life.  Avoid withholding difficult information. Most people prefer to be included in discussions about issues that concern them.  Reassure your loved one that you  will honor advance directives, such as living wills.  Ask if there is anything you can do.  Respect your loved one's need for privacy.  Communicating with a dying loved one  Plan visits and activities for times when your loved one is alert. It is important to speak directly to your loved one and talk as if he or she can hear, even if there is no response. Most people are still able to hear after they are no longer able to speak. Your loved one should not be shaken if he or she does not respond.  Gently remind your loved one of the time, date, and people who are present. If your loved one is agitated, do not attempt to restrain him or her. Be calm and reassuring. Speaking calmly may help to re-orient your loved one. What to expect when death is near Certain signs and symptoms can help you anticipate when death is near. They are described below, along with suggestions for managing them. It is important to remember  that not every person experiences each of the signs and symptoms. Having one or more of these symptoms does not always indicate that your loved one is close to death. A member of your loved one's health care team can give you information about what to expect.  Drowsiness, increased sleep, or unresponsiveness. This is caused by changes in the person's metabolism.  Confusion about time, place, or identity of family and friends; restlessness; visions of people and places that are not present; pulling at bed linens or clothing (caused in part by changes in your loved ones metabolism).  Withdrawal and decreased socialization. This may be caused by decreased oxygen to the brain, decreased blood flow, and mental preparation for dying.  Decreased need for food and fluids, and loss of appetite. This is caused by the body's need to conserve energy and its decreasing ability to use food and fluids properly.  Loss of bladder or bowel control. This is caused by the relaxing of muscles in the pelvic area. You can talk to your loved one's health care team about the possibility of inserting a catheter. A member of the health care team can teach you how to take care of the catheter, if one is needed.  Skin becomes cool to the touch, particularly the hands and feet. Skin may become bluish in color, especially on the underside of the body. This is caused by decreased circulation to the extremities.  Rattling or gurgling sounds while breathing that may be loud; breathing that is irregular and shallow; decreased number of breaths per minute; breathing that alternates between rapid and slow. This is caused by congestion from fluid, a buildup of waste products in the body, and a decrease in circulation to the organs.  Turning the head toward a light source. This is caused by decreasing vision. Leave soft, indirect lights on in the room.  Increased difficulty controlling pain. This is caused by progression of the  disease. It is important to provide pain medicines as your loved one's health care provider has prescribed.  Involuntary movements, changes in heart rate, and loss of reflexes in the legs and arms are also signs that the end of life is near.  What are the signs that the person has died?  There is no breathing or pulse.  The eyes do not move or blink, and the pupils are enlarged (dilated) and do not change with light. The eyelids may be slightly open.  The jaw is relaxed, and  the mouth is slightly open.  The body releases the bowel and bladder contents.  The person does not respond to being touched or spoken to. What should happen after the person has died? After the person has passed away, there is no need to hurry with arrangements. Family members and close friends may wish to sit with the person, talk, or pray. When the family is ready, the following steps can be taken: 1. Place your loved one on his or her back with one pillow under the head. If necessary, you may wish to put your loved ones dentures or other artificial parts in place. 2. If your loved one is in a hospice program, follow the guidelines provided by the program. You can request a hospice nurse to verify the person's death. 3. Contact the appropriate authorities in accordance with local regulations. If your loved one has requested not to be resuscitated through a Do Not Resuscitate (DNR) order or other mechanism, do not call 911. 4. Contact your loved one's health care provider and funeral home. 5. Contact other family members, friends, and clergy who may not be aware of your loved one's passing. 6. Obtain emotional support to cope with the loss of your loved one.  Questions to ask about death and dying  How long is the person expected to live? Where to find more information:  [facility] Organization: http://www.brown-buchanan.com/  [employer]ging: http://kim-miller.com/ This information is not  intended to replace advice given to you by your health care provider. Make sure you discuss any questions you have with your health care provider. Document Released: 11/16/2007 Document Revised: 07/15/2015 Document Reviewed: 07/28/2012 Elsevier Interactive Patient Education  2017 [employer].     Epilepsy Epilepsy is when a person keeps having seizures. A seizure is unusual activity in the brain. A seizure can change how you think or behave, and it can make it hard to be aware of what is happening. This condition can cause problems, such as:  Falls, accidents, and injury.  Depression.  Poor memory.  Sudden unexplained death in epilepsy (SUDEP). This is rare. Its cause is not known.  Most people with epilepsy lead normal lives. Follow these instructions at home: Medicines   Take medicines only as told by your doctor.  Avoid anything that may keep your medicine from working, such as alcohol. Activity  Get enough rest. Lack of sleep can make seizures more likely to occur.  Follow your doctors advice about driving, swimming, and doing anything else that would be dangerous if you had a seizure. Teaching others Teach friends and family what to do if you have a seizure. They should:  Lay you on the ground to prevent a fall.  Cushion your head and body.  Loosen any tight clothing around your neck.  Turn you on your side.  Stay with you until you are better.  Not hold you down.  Not put anything in your mouth.  Know whether or not you need emergency care.  General instructions  Avoid anything that causes you to have seizures.  Keep a seizure diary. Write down what you remember about each seizure, and especially what might have caused it.  Keep all follow-up visits as told by your doctor. This is important. Contact a doctor if:  You have a change in your seizure pattern.  You get an infection or start to feel sick. You may have more seizures when you are  sick. Get help right away if:  A seizure does not stop after 5 minutes.  You have more than one seizure in a row, and you do not have enough time between the seizures to feel better.  A seizure makes it harder to breathe.  A seizure is different from other seizures you have had.  A seizure makes you unable to speak or use a part of your body.  You did not wake up right after a seizure. This information is not intended to replace advice given to you by your health care provider. Make sure you discuss any questions you have with your health care provider. Document Released: 12/04/2008 Document Revised: 09/13/2015 Document Reviewed: 08/17/2015 Elsevier Interactive Patient Education  [employer].

## 2017-12-29 DIAGNOSIS — J449 Chronic obstructive pulmonary disease, unspecified: Secondary | ICD-10-CM | POA: Diagnosis not present

## 2017-12-29 DIAGNOSIS — I5032 Chronic diastolic (congestive) heart failure: Secondary | ICD-10-CM | POA: Diagnosis not present

## 2017-12-29 DIAGNOSIS — F0390 Unspecified dementia without behavioral disturbance: Secondary | ICD-10-CM | POA: Diagnosis not present

## 2017-12-29 DIAGNOSIS — R64 Cachexia: Secondary | ICD-10-CM | POA: Diagnosis not present

## 2017-12-29 DIAGNOSIS — F419 Anxiety disorder, unspecified: Secondary | ICD-10-CM | POA: Diagnosis not present

## 2017-12-31 ENCOUNTER — Telehealth: Payer: Self-pay

## 2017-12-31 DIAGNOSIS — I5032 Chronic diastolic (congestive) heart failure: Secondary | ICD-10-CM | POA: Diagnosis not present

## 2017-12-31 DIAGNOSIS — F419 Anxiety disorder, unspecified: Secondary | ICD-10-CM | POA: Diagnosis not present

## 2017-12-31 DIAGNOSIS — F0390 Unspecified dementia without behavioral disturbance: Secondary | ICD-10-CM | POA: Diagnosis not present

## 2017-12-31 DIAGNOSIS — J449 Chronic obstructive pulmonary disease, unspecified: Secondary | ICD-10-CM | POA: Diagnosis not present

## 2017-12-31 DIAGNOSIS — R64 Cachexia: Secondary | ICD-10-CM | POA: Diagnosis not present

## 2017-12-31 NOTE — Telephone Encounter (Signed)
Transition Care Management Follow-up Telephone Call   Date discharged?    12-30-17         How have you been since you were released from the hospital? Spoke with daughter-in -law, reports patient is "doing fine", under Hospice care. Patient is now non-verbal   Do you understand why you were in the hospital? Yes   Do you understand the discharge instructions? Yes   Where were you discharged to? Home under Hospice care   Items Reviewed:  Medications reviewed: Yes Lorazepam added for seizure  Allergies reviewed: Yes   Dietary changes reviewed: Dysphagia 1 diet   Referrals reviewed: N/A    Functional Questionnaire:   Activities of Daily Living (ADLs):  Under hospice care, not able to do ADL's     Any transportation issues/concerns?: no    Any patient concerns? none   Confirmed importance and date/time of follow-up visits scheduled Yes      Confirmed with patient if condition begins to worsen call PCP or go to the ER.  Patient was given the office number and encouraged to call back with question or concerns.  :

## 2018-01-02 DIAGNOSIS — F419 Anxiety disorder, unspecified: Secondary | ICD-10-CM | POA: Diagnosis not present

## 2018-01-02 DIAGNOSIS — R64 Cachexia: Secondary | ICD-10-CM | POA: Diagnosis not present

## 2018-01-02 DIAGNOSIS — F0390 Unspecified dementia without behavioral disturbance: Secondary | ICD-10-CM | POA: Diagnosis not present

## 2018-01-02 DIAGNOSIS — I5032 Chronic diastolic (congestive) heart failure: Secondary | ICD-10-CM | POA: Diagnosis not present

## 2018-01-02 DIAGNOSIS — J449 Chronic obstructive pulmonary disease, unspecified: Secondary | ICD-10-CM | POA: Diagnosis not present

## 2018-01-03 DIAGNOSIS — R64 Cachexia: Secondary | ICD-10-CM | POA: Diagnosis not present

## 2018-01-03 DIAGNOSIS — F419 Anxiety disorder, unspecified: Secondary | ICD-10-CM | POA: Diagnosis not present

## 2018-01-03 DIAGNOSIS — I5032 Chronic diastolic (congestive) heart failure: Secondary | ICD-10-CM | POA: Diagnosis not present

## 2018-01-03 DIAGNOSIS — J449 Chronic obstructive pulmonary disease, unspecified: Secondary | ICD-10-CM | POA: Diagnosis not present

## 2018-01-03 DIAGNOSIS — F0390 Unspecified dementia without behavioral disturbance: Secondary | ICD-10-CM | POA: Diagnosis not present

## 2018-01-04 ENCOUNTER — Other Ambulatory Visit: Payer: Self-pay

## 2018-01-04 DIAGNOSIS — I5032 Chronic diastolic (congestive) heart failure: Secondary | ICD-10-CM | POA: Diagnosis not present

## 2018-01-04 DIAGNOSIS — R64 Cachexia: Secondary | ICD-10-CM | POA: Diagnosis not present

## 2018-01-04 DIAGNOSIS — F0390 Unspecified dementia without behavioral disturbance: Secondary | ICD-10-CM | POA: Diagnosis not present

## 2018-01-04 DIAGNOSIS — F419 Anxiety disorder, unspecified: Secondary | ICD-10-CM | POA: Diagnosis not present

## 2018-01-04 DIAGNOSIS — J449 Chronic obstructive pulmonary disease, unspecified: Secondary | ICD-10-CM | POA: Diagnosis not present

## 2018-01-04 NOTE — Patient Outreach (Signed)
[facility]) Care Management  01/04/2018  ISSIS LINDSETH Mar 28, 1940 [id number]   [facility] General Discharge received.  Patient currently under hospice care.  Patient not eligible for [facility] services at this time.    Plan: RN CM close case.    Jone Baseman, RN, MSN [facility] Care Management Coordinator Direct Line [phone] Cell [phone] Toll Free: [phone]  Fax: [phone]

## 2018-01-07 DIAGNOSIS — R64 Cachexia: Secondary | ICD-10-CM | POA: Diagnosis not present

## 2018-01-07 DIAGNOSIS — I5032 Chronic diastolic (congestive) heart failure: Secondary | ICD-10-CM | POA: Diagnosis not present

## 2018-01-07 DIAGNOSIS — F419 Anxiety disorder, unspecified: Secondary | ICD-10-CM | POA: Diagnosis not present

## 2018-01-07 DIAGNOSIS — F0390 Unspecified dementia without behavioral disturbance: Secondary | ICD-10-CM | POA: Diagnosis not present

## 2018-01-07 DIAGNOSIS — J449 Chronic obstructive pulmonary disease, unspecified: Secondary | ICD-10-CM | POA: Diagnosis not present

## 2018-01-09 DIAGNOSIS — R64 Cachexia: Secondary | ICD-10-CM | POA: Diagnosis not present

## 2018-01-09 DIAGNOSIS — I5032 Chronic diastolic (congestive) heart failure: Secondary | ICD-10-CM | POA: Diagnosis not present

## 2018-01-09 DIAGNOSIS — F419 Anxiety disorder, unspecified: Secondary | ICD-10-CM | POA: Diagnosis not present

## 2018-01-09 DIAGNOSIS — F0390 Unspecified dementia without behavioral disturbance: Secondary | ICD-10-CM | POA: Diagnosis not present

## 2018-01-09 DIAGNOSIS — J449 Chronic obstructive pulmonary disease, unspecified: Secondary | ICD-10-CM | POA: Diagnosis not present

## 2018-01-10 DIAGNOSIS — J449 Chronic obstructive pulmonary disease, unspecified: Secondary | ICD-10-CM | POA: Diagnosis not present

## 2018-01-10 DIAGNOSIS — F419 Anxiety disorder, unspecified: Secondary | ICD-10-CM | POA: Diagnosis not present

## 2018-01-10 DIAGNOSIS — R64 Cachexia: Secondary | ICD-10-CM | POA: Diagnosis not present

## 2018-01-10 DIAGNOSIS — F0390 Unspecified dementia without behavioral disturbance: Secondary | ICD-10-CM | POA: Diagnosis not present

## 2018-01-10 DIAGNOSIS — I5032 Chronic diastolic (congestive) heart failure: Secondary | ICD-10-CM | POA: Diagnosis not present

## 2018-01-11 DIAGNOSIS — R64 Cachexia: Secondary | ICD-10-CM | POA: Diagnosis not present

## 2018-01-11 DIAGNOSIS — F419 Anxiety disorder, unspecified: Secondary | ICD-10-CM | POA: Diagnosis not present

## 2018-01-11 DIAGNOSIS — I5032 Chronic diastolic (congestive) heart failure: Secondary | ICD-10-CM | POA: Diagnosis not present

## 2018-01-11 DIAGNOSIS — F0390 Unspecified dementia without behavioral disturbance: Secondary | ICD-10-CM | POA: Diagnosis not present

## 2018-01-11 DIAGNOSIS — J449 Chronic obstructive pulmonary disease, unspecified: Secondary | ICD-10-CM | POA: Diagnosis not present

## 2018-01-14 DIAGNOSIS — I5032 Chronic diastolic (congestive) heart failure: Secondary | ICD-10-CM | POA: Diagnosis not present

## 2018-01-14 DIAGNOSIS — F0390 Unspecified dementia without behavioral disturbance: Secondary | ICD-10-CM | POA: Diagnosis not present

## 2018-01-14 DIAGNOSIS — J449 Chronic obstructive pulmonary disease, unspecified: Secondary | ICD-10-CM | POA: Diagnosis not present

## 2018-01-14 DIAGNOSIS — R64 Cachexia: Secondary | ICD-10-CM | POA: Diagnosis not present

## 2018-01-14 DIAGNOSIS — F419 Anxiety disorder, unspecified: Secondary | ICD-10-CM | POA: Diagnosis not present

## 2018-01-15 DIAGNOSIS — J449 Chronic obstructive pulmonary disease, unspecified: Secondary | ICD-10-CM | POA: Diagnosis not present

## 2018-01-15 DIAGNOSIS — I5032 Chronic diastolic (congestive) heart failure: Secondary | ICD-10-CM | POA: Diagnosis not present

## 2018-01-15 DIAGNOSIS — F0390 Unspecified dementia without behavioral disturbance: Secondary | ICD-10-CM | POA: Diagnosis not present

## 2018-01-15 DIAGNOSIS — R64 Cachexia: Secondary | ICD-10-CM | POA: Diagnosis not present

## 2018-01-15 DIAGNOSIS — F419 Anxiety disorder, unspecified: Secondary | ICD-10-CM | POA: Diagnosis not present

## 2018-01-16 DIAGNOSIS — F0390 Unspecified dementia without behavioral disturbance: Secondary | ICD-10-CM | POA: Diagnosis not present

## 2018-01-16 DIAGNOSIS — F419 Anxiety disorder, unspecified: Secondary | ICD-10-CM | POA: Diagnosis not present

## 2018-01-16 DIAGNOSIS — I5032 Chronic diastolic (congestive) heart failure: Secondary | ICD-10-CM | POA: Diagnosis not present

## 2018-01-16 DIAGNOSIS — J449 Chronic obstructive pulmonary disease, unspecified: Secondary | ICD-10-CM | POA: Diagnosis not present

## 2018-01-16 DIAGNOSIS — R64 Cachexia: Secondary | ICD-10-CM | POA: Diagnosis not present

## 2018-01-18 DIAGNOSIS — J449 Chronic obstructive pulmonary disease, unspecified: Secondary | ICD-10-CM | POA: Diagnosis not present

## 2018-01-18 DIAGNOSIS — R64 Cachexia: Secondary | ICD-10-CM | POA: Diagnosis not present

## 2018-01-18 DIAGNOSIS — F0390 Unspecified dementia without behavioral disturbance: Secondary | ICD-10-CM | POA: Diagnosis not present

## 2018-01-18 DIAGNOSIS — F419 Anxiety disorder, unspecified: Secondary | ICD-10-CM | POA: Diagnosis not present

## 2018-01-18 DIAGNOSIS — I5032 Chronic diastolic (congestive) heart failure: Secondary | ICD-10-CM | POA: Diagnosis not present

## 2018-01-19 DIAGNOSIS — I5032 Chronic diastolic (congestive) heart failure: Secondary | ICD-10-CM | POA: Diagnosis not present

## 2018-01-19 DIAGNOSIS — F419 Anxiety disorder, unspecified: Secondary | ICD-10-CM | POA: Diagnosis not present

## 2018-01-19 DIAGNOSIS — R64 Cachexia: Secondary | ICD-10-CM | POA: Diagnosis not present

## 2018-01-19 DIAGNOSIS — J449 Chronic obstructive pulmonary disease, unspecified: Secondary | ICD-10-CM | POA: Diagnosis not present

## 2018-01-19 DIAGNOSIS — F0390 Unspecified dementia without behavioral disturbance: Secondary | ICD-10-CM | POA: Diagnosis not present

## 2018-01-20 DEATH — deceased

## 2018-02-01 ENCOUNTER — Other Ambulatory Visit: Payer: Self-pay | Admitting: Family Medicine
